# Patient Record
Sex: Female | Born: 1945 | Race: White | Hispanic: No | Smoking: Never smoker
Health system: Southern US, Community
[De-identification: ages and names within clinical notes are randomized; demographics above are authoritative.]

## PROBLEM LIST (undated history)

## (undated) DIAGNOSIS — H269 Unspecified cataract: Secondary | ICD-10-CM

## (undated) DIAGNOSIS — K8020 Calculus of gallbladder without cholecystitis without obstruction: Secondary | ICD-10-CM

## (undated) DIAGNOSIS — M810 Age-related osteoporosis without current pathological fracture: Secondary | ICD-10-CM

## (undated) DIAGNOSIS — D649 Anemia, unspecified: Secondary | ICD-10-CM

## (undated) DIAGNOSIS — I499 Cardiac arrhythmia, unspecified: Secondary | ICD-10-CM

## (undated) DIAGNOSIS — K219 Gastro-esophageal reflux disease without esophagitis: Secondary | ICD-10-CM

## (undated) DIAGNOSIS — I4891 Unspecified atrial fibrillation: Secondary | ICD-10-CM

## (undated) DIAGNOSIS — J45909 Unspecified asthma, uncomplicated: Secondary | ICD-10-CM

## (undated) DIAGNOSIS — I10 Essential (primary) hypertension: Secondary | ICD-10-CM

## (undated) DIAGNOSIS — C801 Malignant (primary) neoplasm, unspecified: Secondary | ICD-10-CM

## (undated) DIAGNOSIS — M3500 Sicca syndrome, unspecified: Secondary | ICD-10-CM

## (undated) HISTORY — DX: Unspecified atrial fibrillation: I48.91

## (undated) HISTORY — DX: Age-related osteoporosis without current pathological fracture: M81.0

## (undated) HISTORY — DX: Unspecified cataract: H26.9

## (undated) HISTORY — DX: Anemia, unspecified: D64.9

## (undated) HISTORY — DX: Essential (primary) hypertension: I10

## (undated) HISTORY — DX: Cardiac arrhythmia, unspecified: I49.9

## (undated) HISTORY — DX: Gastro-esophageal reflux disease without esophagitis: K21.9

## (undated) HISTORY — DX: Malignant (primary) neoplasm, unspecified: C80.1

## (undated) HISTORY — DX: Calculus of gallbladder without cholecystitis without obstruction: K80.20

---

## 1976-10-16 HISTORY — PX: AUGMENTATION MAMMAPLASTY: SUR837

## 1994-10-16 HISTORY — PX: ABDOMINAL HYSTERECTOMY: SHX81

## 2003-10-17 HISTORY — PX: ANKLE FRACTURE SURGERY: SHX122

## 2015-10-17 DIAGNOSIS — M5136 Other intervertebral disc degeneration, lumbar region: Secondary | ICD-10-CM | POA: Diagnosis not present

## 2015-10-17 DIAGNOSIS — M545 Low back pain: Secondary | ICD-10-CM | POA: Diagnosis not present

## 2015-10-17 DIAGNOSIS — M3500 Sicca syndrome, unspecified: Secondary | ICD-10-CM | POA: Diagnosis not present

## 2015-10-20 DIAGNOSIS — M545 Low back pain: Secondary | ICD-10-CM | POA: Diagnosis not present

## 2015-10-20 DIAGNOSIS — M5136 Other intervertebral disc degeneration, lumbar region: Secondary | ICD-10-CM | POA: Diagnosis not present

## 2015-10-20 DIAGNOSIS — M3500 Sicca syndrome, unspecified: Secondary | ICD-10-CM | POA: Diagnosis not present

## 2015-10-28 DIAGNOSIS — Z23 Encounter for immunization: Secondary | ICD-10-CM | POA: Diagnosis not present

## 2015-10-28 DIAGNOSIS — E538 Deficiency of other specified B group vitamins: Secondary | ICD-10-CM | POA: Diagnosis not present

## 2015-10-28 DIAGNOSIS — Z5181 Encounter for therapeutic drug level monitoring: Secondary | ICD-10-CM | POA: Diagnosis not present

## 2015-10-28 DIAGNOSIS — E785 Hyperlipidemia, unspecified: Secondary | ICD-10-CM | POA: Diagnosis not present

## 2015-11-03 DIAGNOSIS — M5136 Other intervertebral disc degeneration, lumbar region: Secondary | ICD-10-CM | POA: Diagnosis not present

## 2015-11-03 DIAGNOSIS — M3500 Sicca syndrome, unspecified: Secondary | ICD-10-CM | POA: Diagnosis not present

## 2015-11-03 DIAGNOSIS — M545 Low back pain: Secondary | ICD-10-CM | POA: Diagnosis not present

## 2015-11-05 DIAGNOSIS — Z79899 Other long term (current) drug therapy: Secondary | ICD-10-CM | POA: Diagnosis not present

## 2015-11-05 DIAGNOSIS — E538 Deficiency of other specified B group vitamins: Secondary | ICD-10-CM | POA: Diagnosis not present

## 2015-12-22 DIAGNOSIS — M8580 Other specified disorders of bone density and structure, unspecified site: Secondary | ICD-10-CM | POA: Diagnosis not present

## 2015-12-22 DIAGNOSIS — M3500 Sicca syndrome, unspecified: Secondary | ICD-10-CM | POA: Diagnosis not present

## 2016-04-25 DIAGNOSIS — Z0000 Encounter for general adult medical examination without abnormal findings: Secondary | ICD-10-CM | POA: Diagnosis not present

## 2016-04-25 DIAGNOSIS — Z23 Encounter for immunization: Secondary | ICD-10-CM | POA: Diagnosis not present

## 2016-05-02 DIAGNOSIS — L821 Other seborrheic keratosis: Secondary | ICD-10-CM | POA: Diagnosis not present

## 2016-05-04 DIAGNOSIS — Z1231 Encounter for screening mammogram for malignant neoplasm of breast: Secondary | ICD-10-CM | POA: Diagnosis not present

## 2016-05-09 DIAGNOSIS — M25561 Pain in right knee: Secondary | ICD-10-CM | POA: Diagnosis not present

## 2016-05-11 DIAGNOSIS — M1711 Unilateral primary osteoarthritis, right knee: Secondary | ICD-10-CM | POA: Diagnosis not present

## 2016-05-11 DIAGNOSIS — M25561 Pain in right knee: Secondary | ICD-10-CM | POA: Diagnosis not present

## 2016-06-28 DIAGNOSIS — M5136 Other intervertebral disc degeneration, lumbar region: Secondary | ICD-10-CM | POA: Diagnosis not present

## 2016-06-28 DIAGNOSIS — M1711 Unilateral primary osteoarthritis, right knee: Secondary | ICD-10-CM | POA: Diagnosis not present

## 2016-06-28 DIAGNOSIS — M3500 Sicca syndrome, unspecified: Secondary | ICD-10-CM | POA: Diagnosis not present

## 2016-07-25 DIAGNOSIS — M3501 Sicca syndrome with keratoconjunctivitis: Secondary | ICD-10-CM | POA: Diagnosis not present

## 2016-07-25 DIAGNOSIS — Z79899 Other long term (current) drug therapy: Secondary | ICD-10-CM | POA: Diagnosis not present

## 2016-09-12 DIAGNOSIS — Z79899 Other long term (current) drug therapy: Secondary | ICD-10-CM | POA: Diagnosis not present

## 2016-10-23 DIAGNOSIS — M3500 Sicca syndrome, unspecified: Secondary | ICD-10-CM | POA: Diagnosis not present

## 2016-10-23 DIAGNOSIS — M19041 Primary osteoarthritis, right hand: Secondary | ICD-10-CM | POA: Diagnosis not present

## 2016-10-23 DIAGNOSIS — M1990 Unspecified osteoarthritis, unspecified site: Secondary | ICD-10-CM | POA: Diagnosis not present

## 2016-10-23 DIAGNOSIS — M25741 Osteophyte, right hand: Secondary | ICD-10-CM | POA: Diagnosis not present

## 2016-10-23 DIAGNOSIS — M151 Heberden's nodes (with arthropathy): Secondary | ICD-10-CM | POA: Diagnosis not present

## 2016-10-31 DIAGNOSIS — E785 Hyperlipidemia, unspecified: Secondary | ICD-10-CM | POA: Diagnosis not present

## 2016-10-31 DIAGNOSIS — K219 Gastro-esophageal reflux disease without esophagitis: Secondary | ICD-10-CM | POA: Diagnosis not present

## 2016-11-28 DIAGNOSIS — K219 Gastro-esophageal reflux disease without esophagitis: Secondary | ICD-10-CM | POA: Diagnosis not present

## 2016-11-28 DIAGNOSIS — R1314 Dysphagia, pharyngoesophageal phase: Secondary | ICD-10-CM | POA: Diagnosis not present

## 2016-11-28 DIAGNOSIS — Z1211 Encounter for screening for malignant neoplasm of colon: Secondary | ICD-10-CM | POA: Diagnosis not present

## 2016-12-11 DIAGNOSIS — Z538 Procedure and treatment not carried out for other reasons: Secondary | ICD-10-CM | POA: Diagnosis not present

## 2016-12-11 DIAGNOSIS — K317 Polyp of stomach and duodenum: Secondary | ICD-10-CM | POA: Diagnosis not present

## 2016-12-11 DIAGNOSIS — R1319 Other dysphagia: Secondary | ICD-10-CM | POA: Diagnosis not present

## 2016-12-11 DIAGNOSIS — J45909 Unspecified asthma, uncomplicated: Secondary | ICD-10-CM | POA: Diagnosis not present

## 2016-12-11 DIAGNOSIS — K2950 Unspecified chronic gastritis without bleeding: Secondary | ICD-10-CM | POA: Diagnosis not present

## 2016-12-11 DIAGNOSIS — K210 Gastro-esophageal reflux disease with esophagitis: Secondary | ICD-10-CM | POA: Diagnosis not present

## 2016-12-11 DIAGNOSIS — K2960 Other gastritis without bleeding: Secondary | ICD-10-CM | POA: Diagnosis not present

## 2016-12-11 DIAGNOSIS — K228 Other specified diseases of esophagus: Secondary | ICD-10-CM | POA: Diagnosis not present

## 2016-12-11 DIAGNOSIS — K5730 Diverticulosis of large intestine without perforation or abscess without bleeding: Secondary | ICD-10-CM | POA: Diagnosis not present

## 2016-12-11 DIAGNOSIS — K635 Polyp of colon: Secondary | ICD-10-CM | POA: Diagnosis not present

## 2016-12-11 DIAGNOSIS — K449 Diaphragmatic hernia without obstruction or gangrene: Secondary | ICD-10-CM | POA: Diagnosis not present

## 2016-12-11 DIAGNOSIS — B9681 Helicobacter pylori [H. pylori] as the cause of diseases classified elsewhere: Secondary | ICD-10-CM | POA: Diagnosis not present

## 2016-12-11 DIAGNOSIS — Z79899 Other long term (current) drug therapy: Secondary | ICD-10-CM | POA: Diagnosis not present

## 2016-12-11 DIAGNOSIS — Z7982 Long term (current) use of aspirin: Secondary | ICD-10-CM | POA: Diagnosis not present

## 2016-12-11 DIAGNOSIS — Z1211 Encounter for screening for malignant neoplasm of colon: Secondary | ICD-10-CM | POA: Diagnosis not present

## 2016-12-27 DIAGNOSIS — M1990 Unspecified osteoarthritis, unspecified site: Secondary | ICD-10-CM | POA: Diagnosis not present

## 2016-12-27 DIAGNOSIS — K2970 Gastritis, unspecified, without bleeding: Secondary | ICD-10-CM | POA: Diagnosis not present

## 2016-12-27 DIAGNOSIS — M79646 Pain in unspecified finger(s): Secondary | ICD-10-CM | POA: Diagnosis not present

## 2016-12-27 DIAGNOSIS — B9681 Helicobacter pylori [H. pylori] as the cause of diseases classified elsewhere: Secondary | ICD-10-CM | POA: Diagnosis not present

## 2016-12-27 DIAGNOSIS — K5730 Diverticulosis of large intestine without perforation or abscess without bleeding: Secondary | ICD-10-CM | POA: Diagnosis not present

## 2016-12-27 DIAGNOSIS — K635 Polyp of colon: Secondary | ICD-10-CM | POA: Diagnosis not present

## 2017-01-02 DIAGNOSIS — M65311 Trigger thumb, right thumb: Secondary | ICD-10-CM | POA: Diagnosis not present

## 2017-04-03 DIAGNOSIS — H6980 Other specified disorders of Eustachian tube, unspecified ear: Secondary | ICD-10-CM | POA: Diagnosis not present

## 2017-07-05 DIAGNOSIS — Z23 Encounter for immunization: Secondary | ICD-10-CM | POA: Diagnosis not present

## 2017-07-05 DIAGNOSIS — Z803 Family history of malignant neoplasm of breast: Secondary | ICD-10-CM | POA: Diagnosis not present

## 2017-07-05 DIAGNOSIS — H25093 Other age-related incipient cataract, bilateral: Secondary | ICD-10-CM | POA: Diagnosis not present

## 2017-07-05 DIAGNOSIS — M3500 Sicca syndrome, unspecified: Secondary | ICD-10-CM | POA: Diagnosis not present

## 2017-07-05 DIAGNOSIS — J4520 Mild intermittent asthma, uncomplicated: Secondary | ICD-10-CM | POA: Diagnosis not present

## 2017-07-05 DIAGNOSIS — Z8042 Family history of malignant neoplasm of prostate: Secondary | ICD-10-CM | POA: Diagnosis not present

## 2017-07-05 DIAGNOSIS — M79644 Pain in right finger(s): Secondary | ICD-10-CM | POA: Diagnosis not present

## 2017-07-05 DIAGNOSIS — M1991 Primary osteoarthritis, unspecified site: Secondary | ICD-10-CM | POA: Diagnosis not present

## 2017-07-05 DIAGNOSIS — K219 Gastro-esophageal reflux disease without esophagitis: Secondary | ICD-10-CM | POA: Diagnosis not present

## 2017-07-11 DIAGNOSIS — M65311 Trigger thumb, right thumb: Secondary | ICD-10-CM | POA: Diagnosis not present

## 2017-07-16 DIAGNOSIS — M65311 Trigger thumb, right thumb: Secondary | ICD-10-CM | POA: Diagnosis not present

## 2017-07-31 DIAGNOSIS — H2512 Age-related nuclear cataract, left eye: Secondary | ICD-10-CM | POA: Diagnosis not present

## 2017-07-31 DIAGNOSIS — H2511 Age-related nuclear cataract, right eye: Secondary | ICD-10-CM | POA: Diagnosis not present

## 2017-07-31 DIAGNOSIS — M3501 Sicca syndrome with keratoconjunctivitis: Secondary | ICD-10-CM | POA: Diagnosis not present

## 2017-09-03 DIAGNOSIS — H2511 Age-related nuclear cataract, right eye: Secondary | ICD-10-CM | POA: Diagnosis not present

## 2017-09-03 DIAGNOSIS — H25811 Combined forms of age-related cataract, right eye: Secondary | ICD-10-CM | POA: Diagnosis not present

## 2017-09-14 DIAGNOSIS — M150 Primary generalized (osteo)arthritis: Secondary | ICD-10-CM | POA: Diagnosis not present

## 2017-09-14 DIAGNOSIS — M25561 Pain in right knee: Secondary | ICD-10-CM | POA: Diagnosis not present

## 2017-09-14 DIAGNOSIS — M3500 Sicca syndrome, unspecified: Secondary | ICD-10-CM | POA: Diagnosis not present

## 2017-09-14 DIAGNOSIS — M5136 Other intervertebral disc degeneration, lumbar region: Secondary | ICD-10-CM | POA: Diagnosis not present

## 2017-09-14 DIAGNOSIS — Z6831 Body mass index (BMI) 31.0-31.9, adult: Secondary | ICD-10-CM | POA: Diagnosis not present

## 2017-09-14 DIAGNOSIS — M2550 Pain in unspecified joint: Secondary | ICD-10-CM | POA: Diagnosis not present

## 2017-09-14 DIAGNOSIS — E669 Obesity, unspecified: Secondary | ICD-10-CM | POA: Diagnosis not present

## 2017-09-18 DIAGNOSIS — H25812 Combined forms of age-related cataract, left eye: Secondary | ICD-10-CM | POA: Diagnosis not present

## 2017-09-18 DIAGNOSIS — H2512 Age-related nuclear cataract, left eye: Secondary | ICD-10-CM | POA: Diagnosis not present

## 2017-09-26 DIAGNOSIS — J4520 Mild intermittent asthma, uncomplicated: Secondary | ICD-10-CM | POA: Diagnosis not present

## 2017-09-26 DIAGNOSIS — M3500 Sicca syndrome, unspecified: Secondary | ICD-10-CM | POA: Diagnosis not present

## 2017-09-26 DIAGNOSIS — Z1389 Encounter for screening for other disorder: Secondary | ICD-10-CM | POA: Diagnosis not present

## 2017-09-26 DIAGNOSIS — H9193 Unspecified hearing loss, bilateral: Secondary | ICD-10-CM | POA: Diagnosis not present

## 2017-09-26 DIAGNOSIS — M1991 Primary osteoarthritis, unspecified site: Secondary | ICD-10-CM | POA: Diagnosis not present

## 2017-09-26 DIAGNOSIS — K219 Gastro-esophageal reflux disease without esophagitis: Secondary | ICD-10-CM | POA: Diagnosis not present

## 2017-09-26 DIAGNOSIS — Z1211 Encounter for screening for malignant neoplasm of colon: Secondary | ICD-10-CM | POA: Diagnosis not present

## 2017-09-26 DIAGNOSIS — M65311 Trigger thumb, right thumb: Secondary | ICD-10-CM | POA: Diagnosis not present

## 2017-09-26 DIAGNOSIS — Z1231 Encounter for screening mammogram for malignant neoplasm of breast: Secondary | ICD-10-CM | POA: Diagnosis not present

## 2017-09-26 DIAGNOSIS — Z0000 Encounter for general adult medical examination without abnormal findings: Secondary | ICD-10-CM | POA: Diagnosis not present

## 2017-10-18 DIAGNOSIS — H26491 Other secondary cataract, right eye: Secondary | ICD-10-CM | POA: Diagnosis not present

## 2017-10-22 DIAGNOSIS — H90A31 Mixed conductive and sensorineural hearing loss, unilateral, right ear with restricted hearing on the contralateral side: Secondary | ICD-10-CM | POA: Diagnosis not present

## 2017-10-22 DIAGNOSIS — H903 Sensorineural hearing loss, bilateral: Secondary | ICD-10-CM | POA: Diagnosis not present

## 2017-10-22 DIAGNOSIS — H90A12 Conductive hearing loss, unilateral, left ear with restricted hearing on the contralateral side: Secondary | ICD-10-CM | POA: Diagnosis not present

## 2017-10-22 DIAGNOSIS — H6993 Unspecified Eustachian tube disorder, bilateral: Secondary | ICD-10-CM | POA: Diagnosis not present

## 2017-12-12 DIAGNOSIS — M150 Primary generalized (osteo)arthritis: Secondary | ICD-10-CM | POA: Diagnosis not present

## 2017-12-12 DIAGNOSIS — M3500 Sicca syndrome, unspecified: Secondary | ICD-10-CM | POA: Diagnosis not present

## 2017-12-12 DIAGNOSIS — Z6833 Body mass index (BMI) 33.0-33.9, adult: Secondary | ICD-10-CM | POA: Diagnosis not present

## 2017-12-12 DIAGNOSIS — M5136 Other intervertebral disc degeneration, lumbar region: Secondary | ICD-10-CM | POA: Diagnosis not present

## 2017-12-12 DIAGNOSIS — E669 Obesity, unspecified: Secondary | ICD-10-CM | POA: Diagnosis not present

## 2018-04-08 ENCOUNTER — Other Ambulatory Visit: Payer: Self-pay | Admitting: Internal Medicine

## 2018-04-08 DIAGNOSIS — M3500 Sicca syndrome, unspecified: Secondary | ICD-10-CM | POA: Diagnosis not present

## 2018-04-08 DIAGNOSIS — Z1231 Encounter for screening mammogram for malignant neoplasm of breast: Secondary | ICD-10-CM

## 2018-04-08 DIAGNOSIS — Z1239 Encounter for other screening for malignant neoplasm of breast: Secondary | ICD-10-CM | POA: Diagnosis not present

## 2018-04-08 DIAGNOSIS — M8588 Other specified disorders of bone density and structure, other site: Secondary | ICD-10-CM | POA: Diagnosis not present

## 2018-04-08 DIAGNOSIS — K219 Gastro-esophageal reflux disease without esophagitis: Secondary | ICD-10-CM | POA: Diagnosis not present

## 2018-04-11 DIAGNOSIS — M5136 Other intervertebral disc degeneration, lumbar region: Secondary | ICD-10-CM | POA: Diagnosis not present

## 2018-04-11 DIAGNOSIS — M3500 Sicca syndrome, unspecified: Secondary | ICD-10-CM | POA: Diagnosis not present

## 2018-04-11 DIAGNOSIS — Z6833 Body mass index (BMI) 33.0-33.9, adult: Secondary | ICD-10-CM | POA: Diagnosis not present

## 2018-04-11 DIAGNOSIS — M150 Primary generalized (osteo)arthritis: Secondary | ICD-10-CM | POA: Diagnosis not present

## 2018-04-11 DIAGNOSIS — E669 Obesity, unspecified: Secondary | ICD-10-CM | POA: Diagnosis not present

## 2018-04-23 DIAGNOSIS — H029 Unspecified disorder of eyelid: Secondary | ICD-10-CM | POA: Diagnosis not present

## 2018-04-29 ENCOUNTER — Ambulatory Visit
Admission: RE | Admit: 2018-04-29 | Discharge: 2018-04-29 | Disposition: A | Discharge status: Home / Self Care | Payer: Medicare Other | Source: Ambulatory Visit | Attending: Internal Medicine | Admitting: Internal Medicine

## 2018-04-29 DIAGNOSIS — Z1231 Encounter for screening mammogram for malignant neoplasm of breast: Secondary | ICD-10-CM | POA: Diagnosis not present

## 2018-04-29 IMAGING — MG DIGITAL SCREENING BILATERAL MAMMOGRAM WITH IMPLANTS AND CAD
2 series · 2 of 2 positions shown · non-contrast
Comparison: Previous exam(s).

CLINICAL DATA: Screening.

EXAM:
DIGITAL SCREENING BILATERAL MAMMOGRAM WITH IMPLANTS AND CAD
The patient has prepectoral implants. Standard and implant displaced
views were performed.

[R MLO]
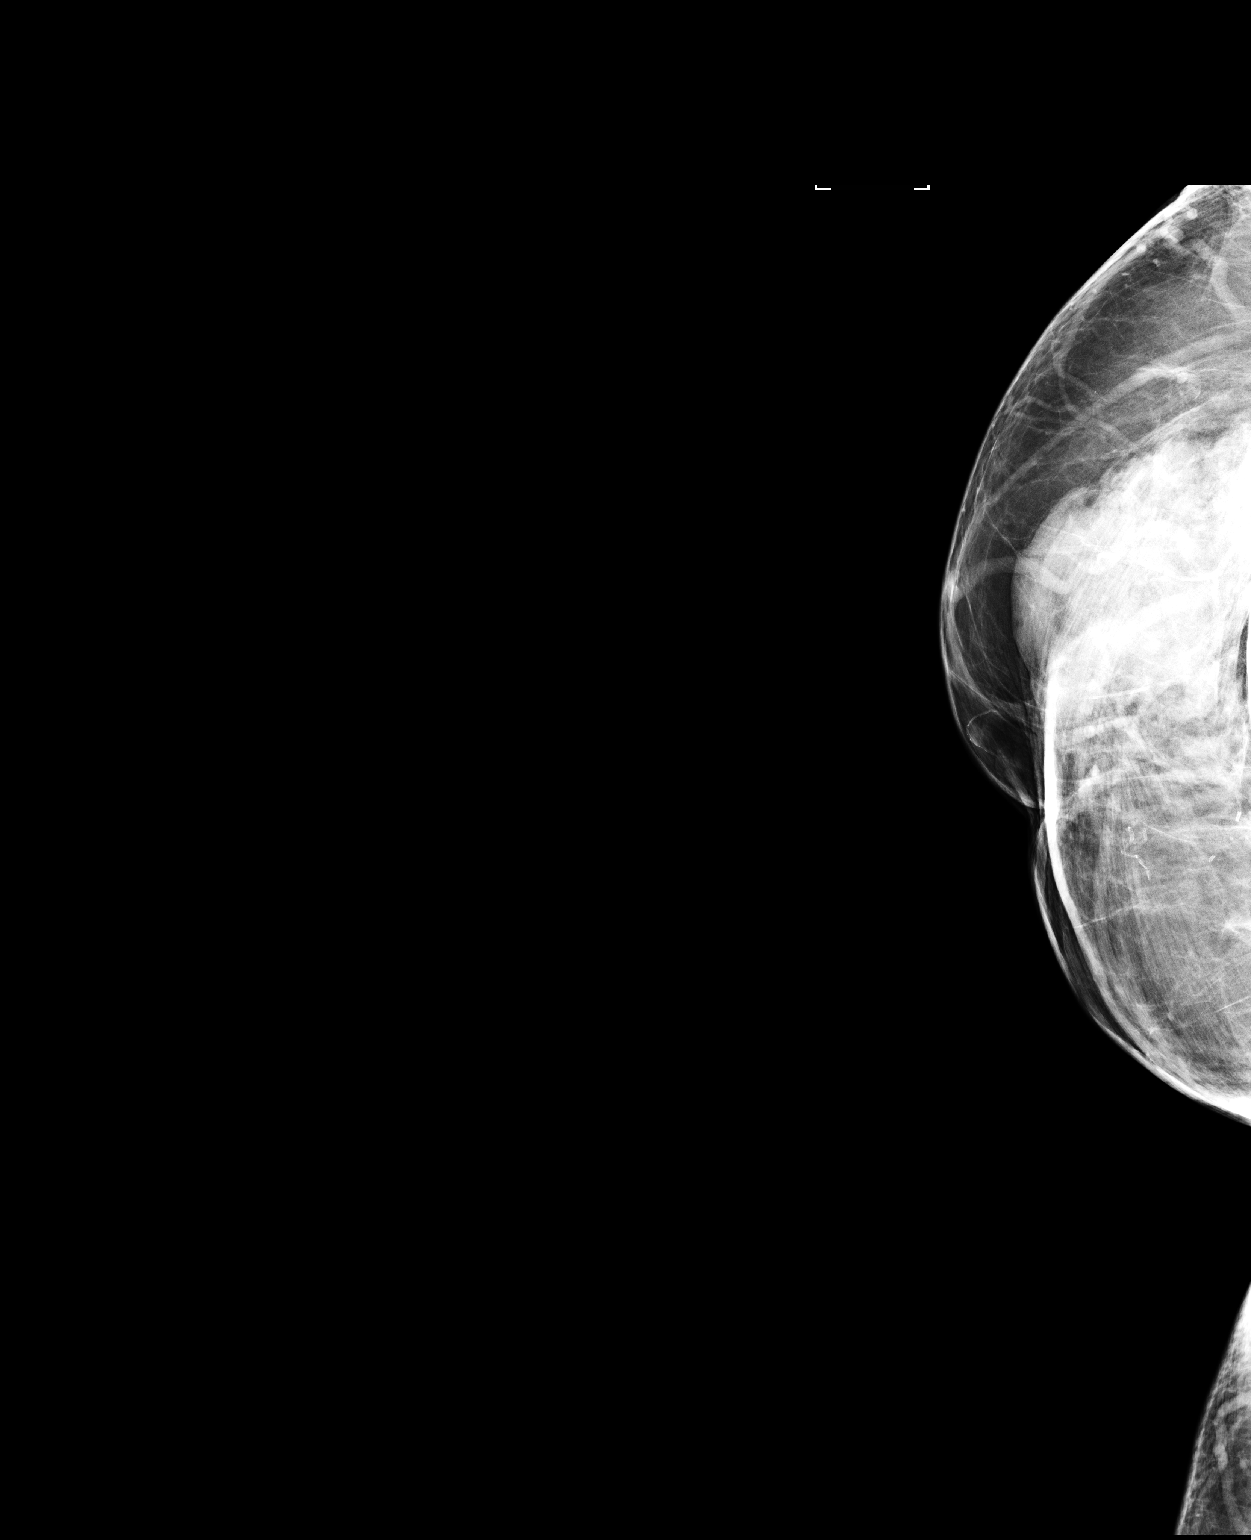

[L MLO]
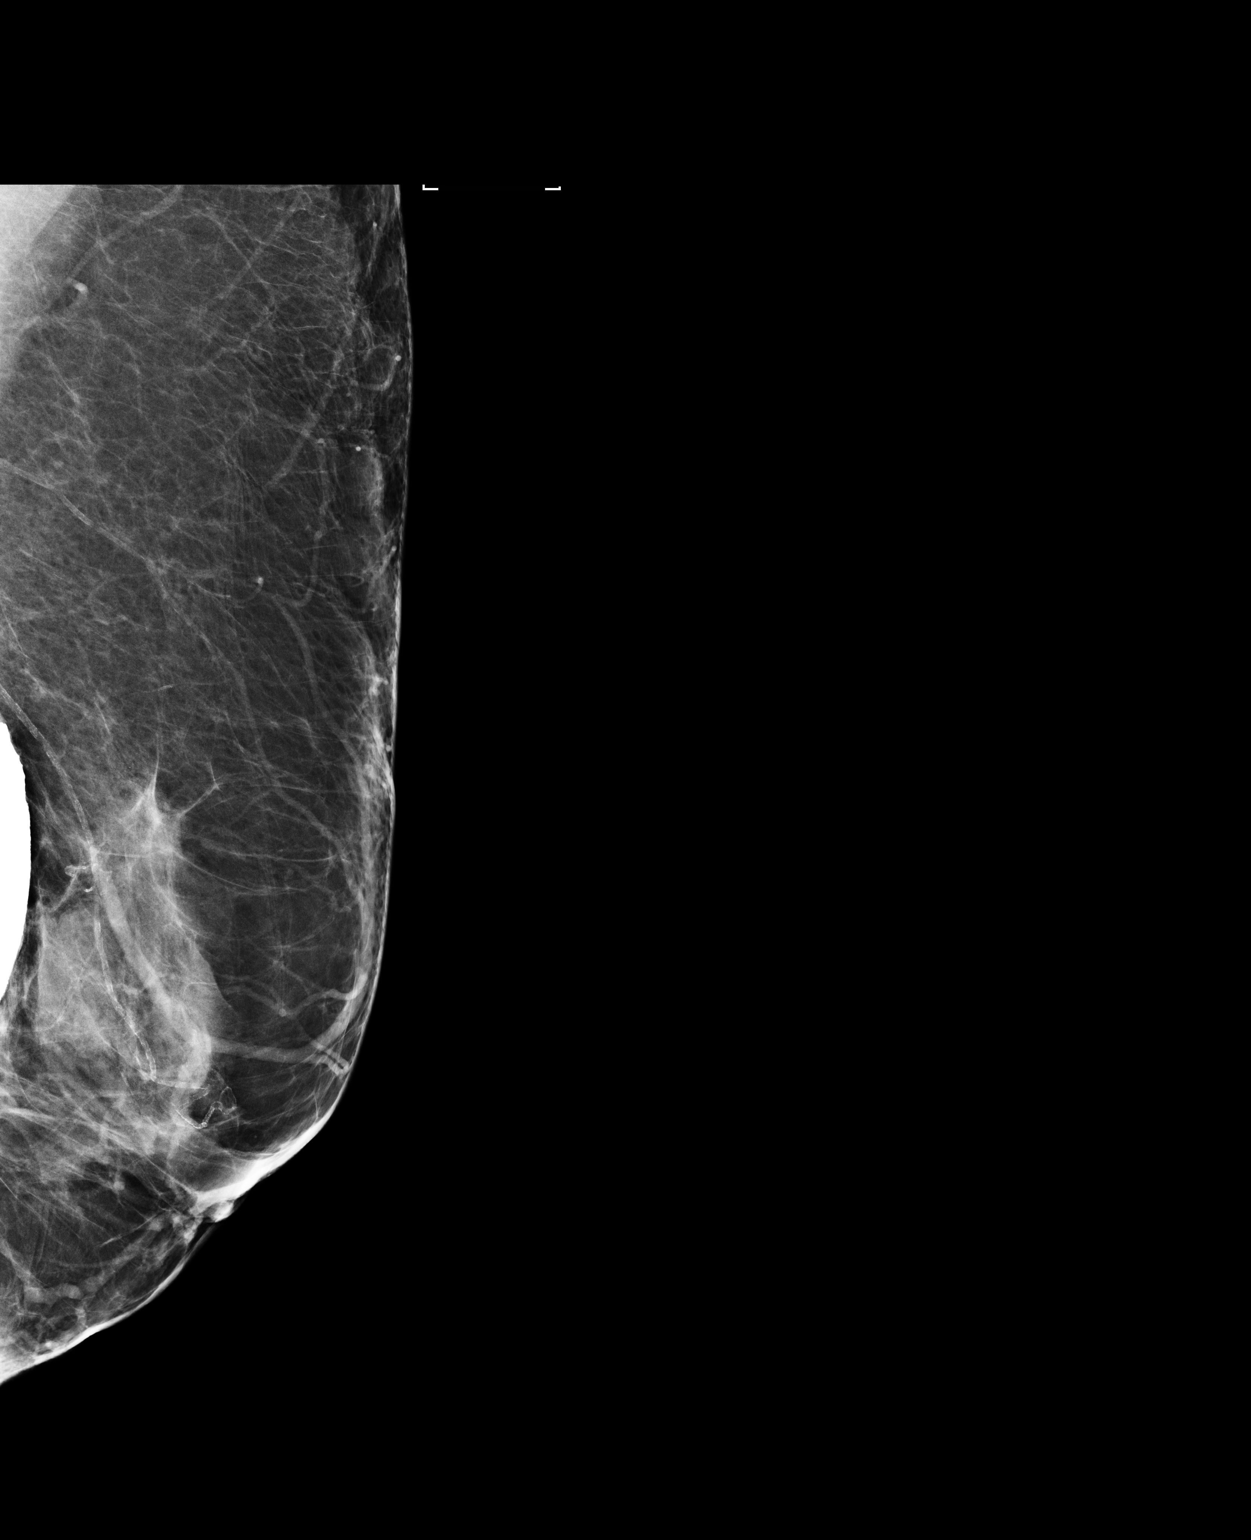

[2 of 2 positions shown; findings below may reference images not displayed]

ACR Breast Density Category c: The breast tissue is heterogeneously
dense, which may obscure small masses.
FINDINGS: There are no findings suspicious for malignancy. There is
extracapsular silicone behind the right breast implant. Images were
processed with CAD.
IMPRESSION: No mammographic evidence of malignancy. A result letter of this
screening mammogram will be mailed directly to the patient.

RECOMMENDATION:
Screening mammogram in one year. (Code:[SM])

BI-RADS CATEGORY  1:  Negative.

## 2018-05-10 DIAGNOSIS — S91209A Unspecified open wound of unspecified toe(s) with damage to nail, initial encounter: Secondary | ICD-10-CM | POA: Diagnosis not present

## 2018-05-10 DIAGNOSIS — L03031 Cellulitis of right toe: Secondary | ICD-10-CM | POA: Diagnosis not present

## 2018-05-16 DIAGNOSIS — M8588 Other specified disorders of bone density and structure, other site: Secondary | ICD-10-CM | POA: Diagnosis not present

## 2018-06-18 DIAGNOSIS — D2210 Melanocytic nevi of unspecified eyelid, including canthus: Secondary | ICD-10-CM | POA: Diagnosis not present

## 2018-06-18 DIAGNOSIS — D492 Neoplasm of unspecified behavior of bone, soft tissue, and skin: Secondary | ICD-10-CM | POA: Diagnosis not present

## 2018-09-11 DIAGNOSIS — N390 Urinary tract infection, site not specified: Secondary | ICD-10-CM | POA: Diagnosis not present

## 2018-09-11 DIAGNOSIS — R350 Frequency of micturition: Secondary | ICD-10-CM | POA: Diagnosis not present

## 2018-09-11 DIAGNOSIS — A499 Bacterial infection, unspecified: Secondary | ICD-10-CM | POA: Diagnosis not present

## 2018-09-24 DIAGNOSIS — M150 Primary generalized (osteo)arthritis: Secondary | ICD-10-CM | POA: Diagnosis not present

## 2018-09-24 DIAGNOSIS — M3500 Sicca syndrome, unspecified: Secondary | ICD-10-CM | POA: Diagnosis not present

## 2018-09-24 DIAGNOSIS — M5136 Other intervertebral disc degeneration, lumbar region: Secondary | ICD-10-CM | POA: Diagnosis not present

## 2018-09-24 DIAGNOSIS — Z6833 Body mass index (BMI) 33.0-33.9, adult: Secondary | ICD-10-CM | POA: Diagnosis not present

## 2018-09-24 DIAGNOSIS — E669 Obesity, unspecified: Secondary | ICD-10-CM | POA: Diagnosis not present

## 2018-09-24 DIAGNOSIS — M25561 Pain in right knee: Secondary | ICD-10-CM | POA: Diagnosis not present

## 2018-09-28 ENCOUNTER — Emergency Department (HOSPITAL_COMMUNITY): Payer: Medicare Other

## 2018-09-28 ENCOUNTER — Encounter: Payer: Self-pay | Admitting: Emergency Medicine

## 2018-09-28 ENCOUNTER — Emergency Department (HOSPITAL_COMMUNITY)
Admission: EM | Admit: 2018-09-28 | Discharge: 2018-09-28 | Disposition: A | Discharge status: Home / Self Care | Payer: Medicare Other | Attending: Emergency Medicine | Admitting: Emergency Medicine

## 2018-09-28 DIAGNOSIS — W010XXA Fall on same level from slipping, tripping and stumbling without subsequent striking against object, initial encounter: Secondary | ICD-10-CM | POA: Diagnosis not present

## 2018-09-28 DIAGNOSIS — S52691A Other fracture of lower end of right ulna, initial encounter for closed fracture: Secondary | ICD-10-CM | POA: Diagnosis not present

## 2018-09-28 DIAGNOSIS — S52601A Unspecified fracture of lower end of right ulna, initial encounter for closed fracture: Secondary | ICD-10-CM | POA: Insufficient documentation

## 2018-09-28 DIAGNOSIS — Y998 Other external cause status: Secondary | ICD-10-CM | POA: Diagnosis not present

## 2018-09-28 DIAGNOSIS — Y9389 Activity, other specified: Secondary | ICD-10-CM | POA: Insufficient documentation

## 2018-09-28 DIAGNOSIS — Z91040 Latex allergy status: Secondary | ICD-10-CM | POA: Diagnosis not present

## 2018-09-28 DIAGNOSIS — S52501A Unspecified fracture of the lower end of right radius, initial encounter for closed fracture: Secondary | ICD-10-CM | POA: Insufficient documentation

## 2018-09-28 DIAGNOSIS — S52611A Displaced fracture of right ulna styloid process, initial encounter for closed fracture: Secondary | ICD-10-CM | POA: Diagnosis not present

## 2018-09-28 DIAGNOSIS — Y92008 Other place in unspecified non-institutional (private) residence as the place of occurrence of the external cause: Secondary | ICD-10-CM | POA: Insufficient documentation

## 2018-09-28 DIAGNOSIS — S6991XA Unspecified injury of right wrist, hand and finger(s), initial encounter: Secondary | ICD-10-CM | POA: Diagnosis present

## 2018-09-28 IMAGING — DX DG WRIST COMPLETE 3+V*R*
4 series · 4 of 4 positions shown · non-contrast
Comparison: None.

CLINICAL DATA: Pain after trauma

EXAM:
RIGHT WRIST - COMPLETE 3+ VIEW

[wrist ap (1 of 2)]
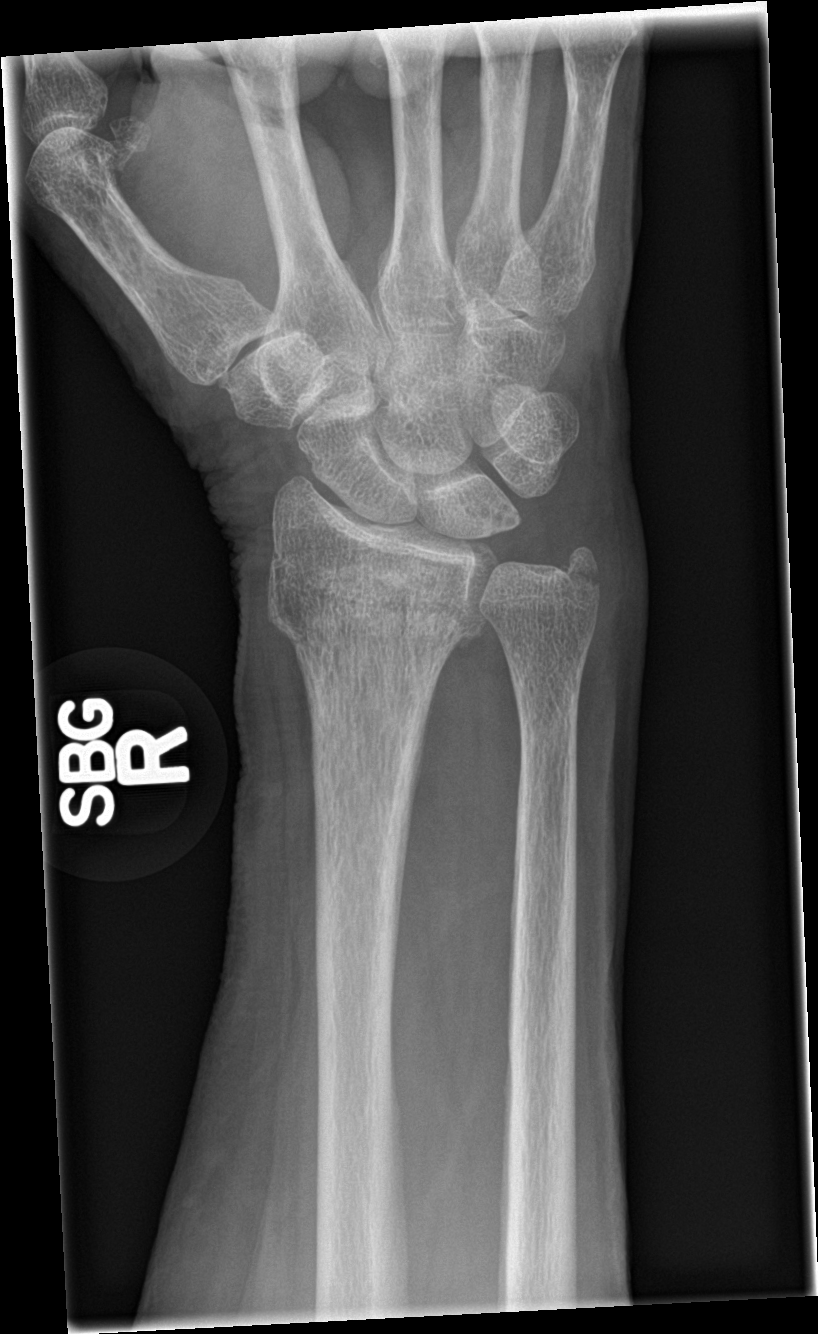

[wrist obl]
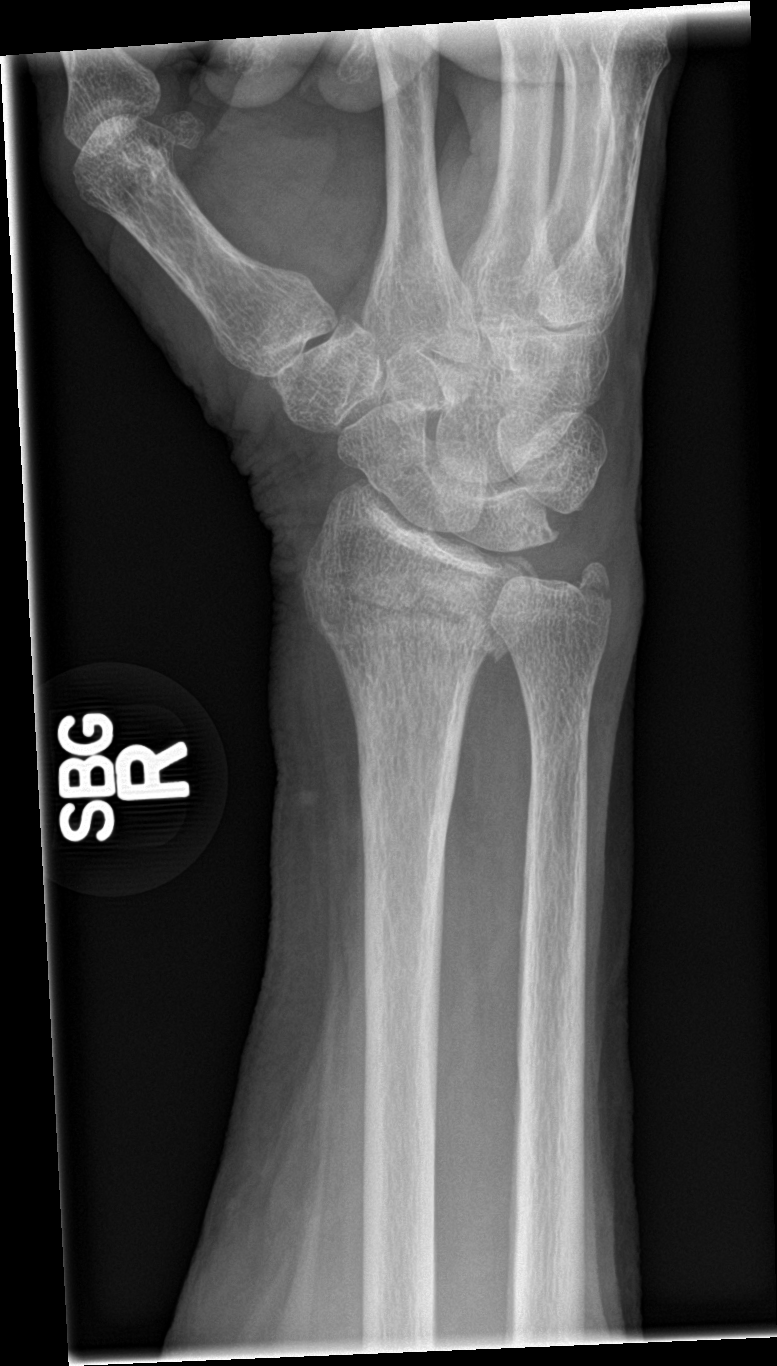

[wrist lat]
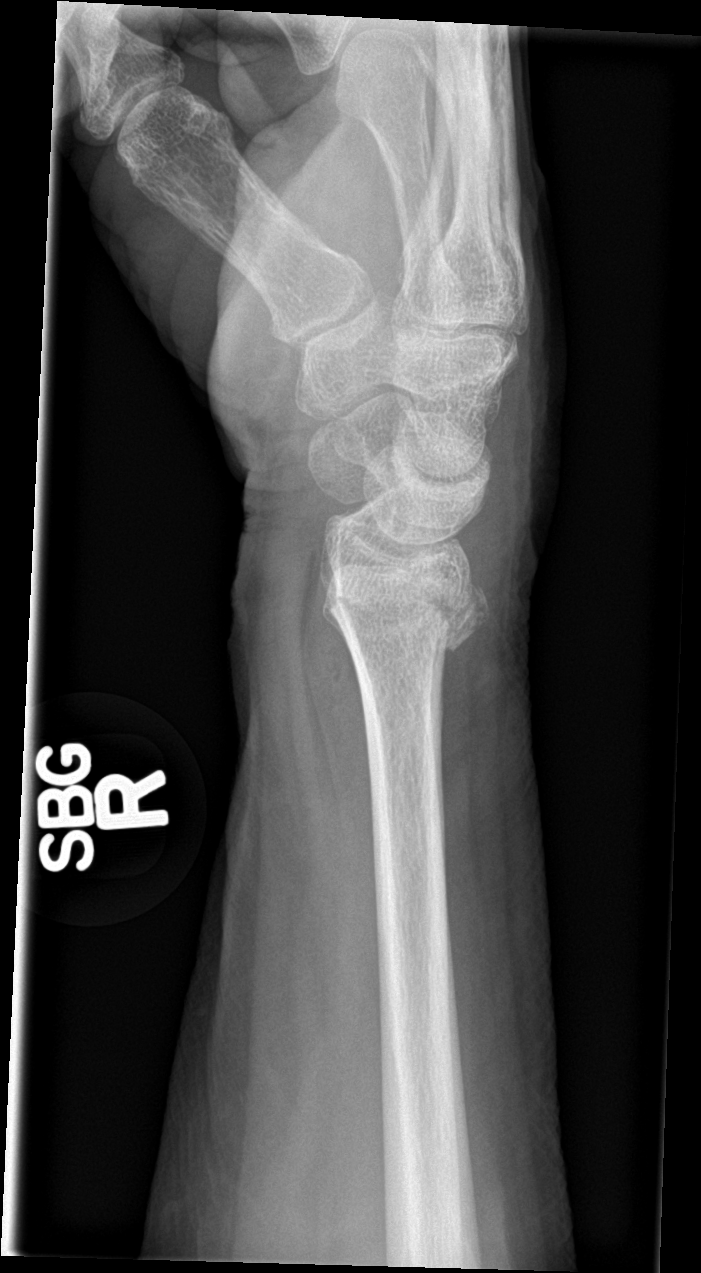

[wrist ap (2 of 2)]
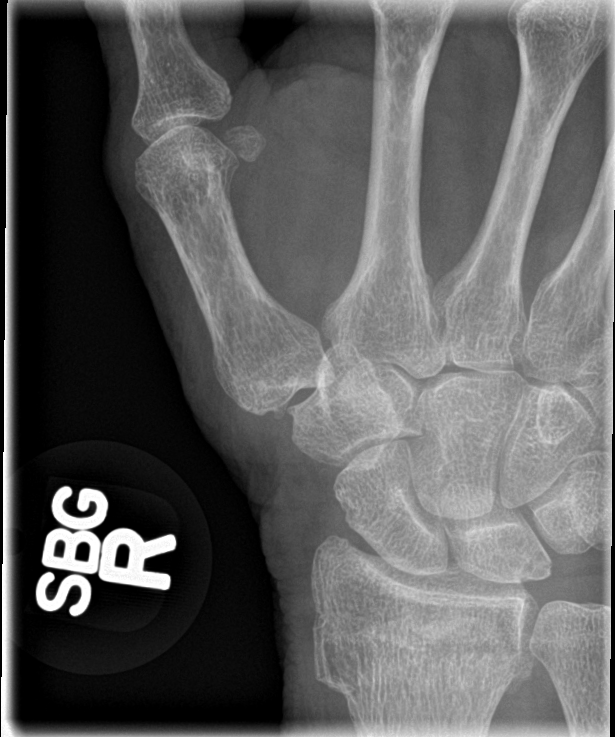

[4 of 4 positions shown; findings below may reference images not displayed]

FINDINGS: There is a fracture through the ulnar styloid. There is an impacted
comminuted mildly displaced fracture through the radial metaphysis.
IMPRESSION: Fracture through the ulnar styloid. Impacted comminuted mildly
displaced fracture through the radial metaphysis.

## 2018-09-28 MED ORDER — HYDROCODONE-ACETAMINOPHEN 5-325 MG PO TABS: 1.0000 | ORAL_TABLET | Freq: Three times a day (TID) | ORAL | 0 refills | Status: DC | PRN

## 2018-09-28 MED ORDER — HYDROCODONE-ACETAMINOPHEN 5-325 MG PO TABS
1.0000 | ORAL_TABLET | Freq: Once | ORAL | Status: AC
  Administered 2018-09-28: 1 via ORAL
  Filled 2018-09-28: qty 1

## 2018-09-28 NOTE — ED Notes (Signed)
Discharge instructions and prescription discussed with Pt. Pt verbalized understanding. Pt stable and ambulatory.    

## 2018-09-28 NOTE — ED Triage Notes (Signed)
Pt reports she was shaking her door mat out and tripped and fell and landed on right wrist. Denies LOC or hitting head.

## 2018-09-28 NOTE — Progress Notes (Signed)
Orthopedic Tech Progress Note Patient Details:  Denise Payne 1946-01-18 [id number]  Ortho Devices Type of Ortho Device: Ace wrap, Sugartong splint, Arm sling Ortho Device/Splint Interventions: Application   Post Interventions Patient Tolerated: Well Instructions Provided: Care of device   [name] 09/28/2018, 6:44 PM

## 2018-09-28 NOTE — ED Provider Notes (Signed)
[facility] EMERGENCY DEPARTMENT Provider Note   CSN: [id number] Arrival date & time: 09/28/18  1722     History   Chief Complaint Chief Complaint  Patient presents with  . Wrist Pain    HPI Denise Payne is a 71 y.o. female who presents for evaluation of right wrist pain that began about 45 minutes prior to ED arrival.  Patient reports she was outside and she was shaking on a rug.  She states when she turned around to come back in, she tripped, causing her to fall forward.  She denies any preceding chest pain or dizziness.  She states that she had her arm outstretched and landed on the dorsal aspect of her right wrist.  Since then has had pain in that wrist.  She had reports limited range of motion secondary to pain.  She has not take any medications for pain prior to coming to the ED.  She states she did not hit her head or lose consciousness.  She is not on any blood thinners.  Patient denies any numbness.  The history is provided by the patient.    History reviewed. No pertinent past medical history.  There are no active problems to display for this patient.   Past Surgical History:  Procedure Laterality Date  . AUGMENTATION MAMMAPLASTY Bilateral 10/16/1976     OB History   No obstetric history on file.      Home Medications    Prior to Admission medications   Not on File    Family History Family History  Problem Relation Age of Onset  . Breast cancer Mother 14    Social History Social History   Tobacco Use  . Smoking status: Never Smoker  . Smokeless tobacco: Never Used  Substance Use Topics  . Alcohol use: Yes    Alcohol/week: 1.0 standard drinks    Types: 1 Glasses of wine per week  . Drug use: Never     Allergies   Latex   Review of Systems Review of Systems  Musculoskeletal:       Right wrist pain  Neurological: Negative for numbness.  All other systems reviewed and are negative.    Physical Exam Updated Vital  Signs BP 139/77 (BP Location: Left Arm)   Pulse 84   Temp (!) 97.5 F (36.4 C) (Oral)   Resp 16   Ht 5\' 2"  (1.575 m)   Wt 81.6 kg   SpO2 99%   BMI 32.92 kg/m   Physical Exam Vitals signs and nursing note reviewed.  Constitutional:      Appearance: She is well-developed.  HENT:     Head: Normocephalic and atraumatic.  Eyes:     General: No scleral icterus.       Right eye: No discharge.        Left eye: No discharge.     Conjunctiva/sclera: Conjunctivae normal.  Cardiovascular:     Pulses:          Radial pulses are 2+ on the right side and 2+ on the left side.  Pulmonary:     Effort: Pulmonary effort is normal.  Musculoskeletal:     Comments: Tenderness palpation noted to the dorsal aspect of right wrist with overlying soft tissue swelling.  Limited flexion/extension secondary to pain.  She can move all 5 digits of right hand without any difficulty.  No tenderness palpation noted to the mid forearm, right elbow, right shoulder.  No tenderness palpation noted to left wrist.  Skin:    General: Skin is warm and dry.     Capillary Refill: Capillary refill takes less than 2 seconds.     Comments: Good distal cap refill.  RUE is not dusky in appearance or cool to touch.\  Neurological:     Mental Status: She is alert.     Comments: Sensation intact along major nerve distributions of BUE  Psychiatric:        Speech: Speech normal.        Behavior: Behavior normal.      ED Treatments / Results  Labs (all labs ordered are listed, but only abnormal results are displayed) Labs Reviewed - No data to display  EKG None  Radiology Dg Wrist Complete Right  Result Date: 09/28/2018 CLINICAL DATA:  Pain after trauma EXAM: RIGHT WRIST - COMPLETE 3+ VIEW COMPARISON:  None. FINDINGS: There is a fracture through the ulnar styloid. There is an impacted comminuted mildly displaced fracture through the radial metaphysis. IMPRESSION: Fracture through the ulnar styloid. Impacted  comminuted mildly displaced fracture through the radial metaphysis. Electronically Signed   By: Dorise Bullion III M.D   On: 09/28/2018 17:54    Procedures Procedures (including critical care time)  Medications Ordered in ED Medications  HYDROcodone-acetaminophen (NORCO/VICODIN) 5-325 MG per tablet 1 tablet (1 tablet Oral Given 09/28/18 1737)     Initial Impression / Assessment and Plan / ED Course  I have reviewed the triage vital signs and the nursing notes.  Pertinent labs & imaging results that were available during my care of the patient were reviewed by me and considered in my medical decision making (see chart for details).     72 year old female who presents for evaluation of right wrist pain that began after mechanical fall that occurred earlier today.  No preceding chest pain or dizziness. Patient is afebrile, non-toxic appearing, sitting comfortably on examination table. Vital signs reviewed and stable.  Patient is neurovascularly intact.  Consider fracture versus dislocation.  We will plan for x-rays.  X-ray shows comminuted distal radius fracture as well as ulnar styloid fracture.  Discussed results with patient.  Will place patient in sugar tong splint.  Patient has previously Dr. Fredna Dow for trigger finger surgery.  Instructed patient to follow-up with Dr. Fredna Dow additionally for follow-up on an outpatient basis.  Reevaluation after splint placement.  Patient with good distal cap refill and sensation.  Encourage at home supportive care measures. Patient had ample opportunity for questions and discussion. All patient's questions were answered with full understanding. Strict return precautions discussed. Patient expresses understanding and agreement to plan.   Final Clinical Impressions(s) / ED Diagnoses   Final diagnoses:  Closed fracture of distal end of right radius, unspecified fracture morphology, initial encounter  Other closed fracture of distal end of right ulna,  initial encounter    ED Discharge Orders    None       Desma Mcgregor 09/28/18 2231    Margette Fast, MD 09/29/18 [phone]

## 2018-09-28 NOTE — ED Notes (Signed)
Ortho tech paged  

## 2018-09-28 NOTE — Discharge Instructions (Signed)
You can take 1000 mg of Tylenol.  Do not exceed 4000 mg of Tylenol a day.  Take pain medications as directed for break through pain. Do not drive or operate machinery while taking this medication.   Call Dr. Levell July office on Monday for further follow-up in his office.  Return the emergency department for any worsening pain, swelling of the arm, numbness or weakness in the fingers or any other worsening concerning symptoms.

## 2018-09-30 DIAGNOSIS — S52551A Other extraarticular fracture of lower end of right radius, initial encounter for closed fracture: Secondary | ICD-10-CM | POA: Diagnosis not present

## 2018-10-14 DIAGNOSIS — S52551A Other extraarticular fracture of lower end of right radius, initial encounter for closed fracture: Secondary | ICD-10-CM | POA: Diagnosis not present

## 2018-11-05 DIAGNOSIS — S52551A Other extraarticular fracture of lower end of right radius, initial encounter for closed fracture: Secondary | ICD-10-CM | POA: Diagnosis not present

## 2018-11-26 DIAGNOSIS — S52551A Other extraarticular fracture of lower end of right radius, initial encounter for closed fracture: Secondary | ICD-10-CM | POA: Diagnosis not present

## 2018-12-19 DIAGNOSIS — S52551A Other extraarticular fracture of lower end of right radius, initial encounter for closed fracture: Secondary | ICD-10-CM | POA: Diagnosis not present

## 2018-12-23 DIAGNOSIS — S52551D Other extraarticular fracture of lower end of right radius, subsequent encounter for closed fracture with routine healing: Secondary | ICD-10-CM | POA: Diagnosis not present

## 2018-12-23 DIAGNOSIS — M25531 Pain in right wrist: Secondary | ICD-10-CM | POA: Diagnosis not present

## 2018-12-23 DIAGNOSIS — R29898 Other symptoms and signs involving the musculoskeletal system: Secondary | ICD-10-CM | POA: Diagnosis not present

## 2018-12-23 DIAGNOSIS — M25631 Stiffness of right wrist, not elsewhere classified: Secondary | ICD-10-CM | POA: Diagnosis not present

## 2018-12-25 DIAGNOSIS — M25531 Pain in right wrist: Secondary | ICD-10-CM | POA: Diagnosis not present

## 2018-12-25 DIAGNOSIS — M25631 Stiffness of right wrist, not elsewhere classified: Secondary | ICD-10-CM | POA: Diagnosis not present

## 2018-12-25 DIAGNOSIS — R29898 Other symptoms and signs involving the musculoskeletal system: Secondary | ICD-10-CM | POA: Diagnosis not present

## 2018-12-25 DIAGNOSIS — S52551D Other extraarticular fracture of lower end of right radius, subsequent encounter for closed fracture with routine healing: Secondary | ICD-10-CM | POA: Diagnosis not present

## 2018-12-30 DIAGNOSIS — M25531 Pain in right wrist: Secondary | ICD-10-CM | POA: Diagnosis not present

## 2018-12-30 DIAGNOSIS — M25631 Stiffness of right wrist, not elsewhere classified: Secondary | ICD-10-CM | POA: Diagnosis not present

## 2018-12-30 DIAGNOSIS — S52551D Other extraarticular fracture of lower end of right radius, subsequent encounter for closed fracture with routine healing: Secondary | ICD-10-CM | POA: Diagnosis not present

## 2018-12-30 DIAGNOSIS — R29898 Other symptoms and signs involving the musculoskeletal system: Secondary | ICD-10-CM | POA: Diagnosis not present

## 2019-01-01 DIAGNOSIS — M25531 Pain in right wrist: Secondary | ICD-10-CM | POA: Diagnosis not present

## 2019-01-01 DIAGNOSIS — S52551D Other extraarticular fracture of lower end of right radius, subsequent encounter for closed fracture with routine healing: Secondary | ICD-10-CM | POA: Diagnosis not present

## 2019-01-01 DIAGNOSIS — R29898 Other symptoms and signs involving the musculoskeletal system: Secondary | ICD-10-CM | POA: Diagnosis not present

## 2019-01-01 DIAGNOSIS — M25631 Stiffness of right wrist, not elsewhere classified: Secondary | ICD-10-CM | POA: Diagnosis not present

## 2019-01-06 DIAGNOSIS — M25631 Stiffness of right wrist, not elsewhere classified: Secondary | ICD-10-CM | POA: Diagnosis not present

## 2019-01-06 DIAGNOSIS — M25531 Pain in right wrist: Secondary | ICD-10-CM | POA: Diagnosis not present

## 2019-01-06 DIAGNOSIS — R29898 Other symptoms and signs involving the musculoskeletal system: Secondary | ICD-10-CM | POA: Diagnosis not present

## 2019-01-06 DIAGNOSIS — S52551D Other extraarticular fracture of lower end of right radius, subsequent encounter for closed fracture with routine healing: Secondary | ICD-10-CM | POA: Diagnosis not present

## 2019-01-14 DIAGNOSIS — M25531 Pain in right wrist: Secondary | ICD-10-CM | POA: Diagnosis not present

## 2019-01-14 DIAGNOSIS — R29898 Other symptoms and signs involving the musculoskeletal system: Secondary | ICD-10-CM | POA: Diagnosis not present

## 2019-01-14 DIAGNOSIS — M25631 Stiffness of right wrist, not elsewhere classified: Secondary | ICD-10-CM | POA: Diagnosis not present

## 2019-01-14 DIAGNOSIS — S52551D Other extraarticular fracture of lower end of right radius, subsequent encounter for closed fracture with routine healing: Secondary | ICD-10-CM | POA: Diagnosis not present

## 2019-01-21 DIAGNOSIS — S52551A Other extraarticular fracture of lower end of right radius, initial encounter for closed fracture: Secondary | ICD-10-CM | POA: Diagnosis not present

## 2019-01-21 DIAGNOSIS — M65311 Trigger thumb, right thumb: Secondary | ICD-10-CM | POA: Diagnosis not present

## 2019-01-22 DIAGNOSIS — S52551A Other extraarticular fracture of lower end of right radius, initial encounter for closed fracture: Secondary | ICD-10-CM | POA: Diagnosis not present

## 2019-01-22 DIAGNOSIS — M25631 Stiffness of right wrist, not elsewhere classified: Secondary | ICD-10-CM | POA: Diagnosis not present

## 2019-01-22 DIAGNOSIS — M25531 Pain in right wrist: Secondary | ICD-10-CM | POA: Diagnosis not present

## 2019-01-22 DIAGNOSIS — R29898 Other symptoms and signs involving the musculoskeletal system: Secondary | ICD-10-CM | POA: Diagnosis not present

## 2019-01-22 DIAGNOSIS — S52551D Other extraarticular fracture of lower end of right radius, subsequent encounter for closed fracture with routine healing: Secondary | ICD-10-CM | POA: Diagnosis not present

## 2019-01-25 ENCOUNTER — Inpatient Hospital Stay (HOSPITAL_BASED_OUTPATIENT_CLINIC_OR_DEPARTMENT_OTHER)
Admission: EM | Admit: 2019-01-25 | Discharge: 2019-02-08 | DRG: 391 | Disposition: A | Discharge status: Home / Self Care | Payer: Medicare Other | Attending: Family Medicine | Admitting: Family Medicine

## 2019-01-25 ENCOUNTER — Emergency Department (HOSPITAL_BASED_OUTPATIENT_CLINIC_OR_DEPARTMENT_OTHER): Payer: Medicare Other

## 2019-01-25 ENCOUNTER — Other Ambulatory Visit: Payer: Self-pay

## 2019-01-25 ENCOUNTER — Encounter (HOSPITAL_BASED_OUTPATIENT_CLINIC_OR_DEPARTMENT_OTHER): Payer: Self-pay | Admitting: Emergency Medicine

## 2019-01-25 DIAGNOSIS — R8271 Bacteriuria: Secondary | ICD-10-CM | POA: Diagnosis not present

## 2019-01-25 DIAGNOSIS — R3989 Other symptoms and signs involving the genitourinary system: Secondary | ICD-10-CM | POA: Diagnosis present

## 2019-01-25 DIAGNOSIS — Z7982 Long term (current) use of aspirin: Secondary | ICD-10-CM

## 2019-01-25 DIAGNOSIS — Z6832 Body mass index (BMI) 32.0-32.9, adult: Secondary | ICD-10-CM

## 2019-01-25 DIAGNOSIS — Z79899 Other long term (current) drug therapy: Secondary | ICD-10-CM

## 2019-01-25 DIAGNOSIS — K631 Perforation of intestine (nontraumatic): Secondary | ICD-10-CM | POA: Diagnosis not present

## 2019-01-25 DIAGNOSIS — K5732 Diverticulitis of large intestine without perforation or abscess without bleeding: Secondary | ICD-10-CM | POA: Diagnosis not present

## 2019-01-25 DIAGNOSIS — M3500 Sicca syndrome, unspecified: Secondary | ICD-10-CM | POA: Diagnosis not present

## 2019-01-25 DIAGNOSIS — K5720 Diverticulitis of large intestine with perforation and abscess without bleeding: Principal | ICD-10-CM | POA: Diagnosis present

## 2019-01-25 DIAGNOSIS — E669 Obesity, unspecified: Secondary | ICD-10-CM | POA: Diagnosis present

## 2019-01-25 DIAGNOSIS — K651 Peritoneal abscess: Secondary | ICD-10-CM | POA: Diagnosis present

## 2019-01-25 DIAGNOSIS — J45998 Other asthma: Secondary | ICD-10-CM | POA: Diagnosis present

## 2019-01-25 DIAGNOSIS — K5792 Diverticulitis of intestine, part unspecified, without perforation or abscess without bleeding: Secondary | ICD-10-CM

## 2019-01-25 DIAGNOSIS — J4520 Mild intermittent asthma, uncomplicated: Secondary | ICD-10-CM | POA: Diagnosis not present

## 2019-01-25 DIAGNOSIS — N321 Vesicointestinal fistula: Secondary | ICD-10-CM | POA: Diagnosis not present

## 2019-01-25 DIAGNOSIS — Z91040 Latex allergy status: Secondary | ICD-10-CM | POA: Diagnosis not present

## 2019-01-25 DIAGNOSIS — E876 Hypokalemia: Secondary | ICD-10-CM | POA: Diagnosis not present

## 2019-01-25 DIAGNOSIS — Z23 Encounter for immunization: Secondary | ICD-10-CM | POA: Diagnosis not present

## 2019-01-25 DIAGNOSIS — R1030 Lower abdominal pain, unspecified: Secondary | ICD-10-CM | POA: Diagnosis not present

## 2019-01-25 DIAGNOSIS — R1084 Generalized abdominal pain: Secondary | ICD-10-CM | POA: Diagnosis not present

## 2019-01-25 DIAGNOSIS — J45909 Unspecified asthma, uncomplicated: Secondary | ICD-10-CM | POA: Diagnosis present

## 2019-01-25 HISTORY — DX: Sicca syndrome, unspecified: M35.00

## 2019-01-25 HISTORY — DX: Unspecified asthma, uncomplicated: J45.909

## 2019-01-25 LAB — COMPREHENSIVE METABOLIC PANEL
ALT: 17 U/L (ref 0–44)
AST: 19 U/L (ref 15–41)
Albumin: 3.8 g/dL (ref 3.5–5.0)
Alkaline Phosphatase: 86 U/L (ref 38–126)
Anion gap: 5 (ref 5–15)
BUN: 18 mg/dL (ref 8–23)
CO2: 23 mmol/L (ref 22–32)
Calcium: 9 mg/dL (ref 8.9–10.3)
Chloride: 102 mmol/L (ref 98–111)
Creatinine, Ser: 0.77 mg/dL (ref 0.44–1.00)
GFR calc Af Amer: >60 mL/min (ref >60)
GFR calc non Af Amer: >60 mL/min (ref >60)
Glucose, Bld: 96 mg/dL (ref 70–99)
Potassium: 3.4 mmol/L — ABNORMAL LOW (ref 3.5–5.1)
Sodium: 137 mmol/L (ref 135–145)
Total Bilirubin: 0.9 mg/dL (ref 0.3–1.2)
Total Protein: 7 g/dL (ref 6.5–8.1)

## 2019-01-25 LAB — URINALYSIS, ROUTINE W REFLEX MICROSCOPIC
Bilirubin Urine: NEGATIVE
Glucose, UA: NEGATIVE mg/dL
Ketones, ur: 40 mg/dL — AB
Nitrite: POSITIVE — AB
Protein, ur: 30 mg/dL — AB
Specific Gravity, Urine: 1.025 (ref 1.005–1.030)
pH: 6 (ref 5.0–8.0)

## 2019-01-25 LAB — CBC WITH DIFFERENTIAL/PLATELET
Abs Immature Granulocytes: 0.11 10*3/uL — ABNORMAL HIGH (ref 0.00–0.07)
Basophils Absolute: 0 10*3/uL (ref 0.0–0.1)
Basophils Relative: 0 %
Eosinophils Absolute: 0 10*3/uL (ref 0.0–0.5)
Eosinophils Relative: 0 %
HCT: 37.1 % (ref 36.0–46.0)
Hemoglobin: 11.7 g/dL — ABNORMAL LOW (ref 12.0–15.0)
Immature Granulocytes: 1 %
Lymphocytes Relative: 6 %
Lymphs Abs: 1.1 10*3/uL (ref 0.7–4.0)
MCH: 28.5 pg (ref 26.0–34.0)
MCHC: 31.5 g/dL (ref 30.0–36.0)
MCV: 90.3 fL (ref 80.0–100.0)
Monocytes Absolute: 0.7 10*3/uL (ref 0.1–1.0)
Monocytes Relative: 4 %
Neutro Abs: 15.2 10*3/uL — ABNORMAL HIGH (ref 1.7–7.7)
Neutrophils Relative %: 89 %
Platelets: 272 10*3/uL (ref 150–400)
RBC: 4.11 MIL/uL (ref 3.87–5.11)
RDW: 12.9 % (ref 11.5–15.5)
WBC: 17.1 10*3/uL — ABNORMAL HIGH (ref 4.0–10.5)
nRBC: 0 % (ref 0.0–0.2)

## 2019-01-25 LAB — URINALYSIS, MICROSCOPIC (REFLEX)

## 2019-01-25 LAB — LIPASE, BLOOD: Lipase: 26 U/L (ref 11–51)

## 2019-01-25 IMAGING — CT CT ABDOMEN AND PELVIS WITH CONTRAST
2 of 5 series · 16 of 46 positions shown, 18 images · IV contrast (APPLIED)
Comparison: None.

CLINICAL DATA: Diffuse abdominal pain. History of GERD, nausea.

EXAM:
CT ABDOMEN AND PELVIS WITH CONTRAST
TECHNIQUE: Multidetector CT imaging of the abdomen and pelvis was performed
using the standard protocol following bolus administration of
intravenous contrast.
CONTRAST:  100mL OMNIPAQUE IOHEXOL 300 MG/ML  SOLN

[Series 2: axial st · axial · 0.83mm/px · z∈[+405,+805]mm · 13 of 90 slices shown, 15 images]
[im 5/90  soft-tissue]
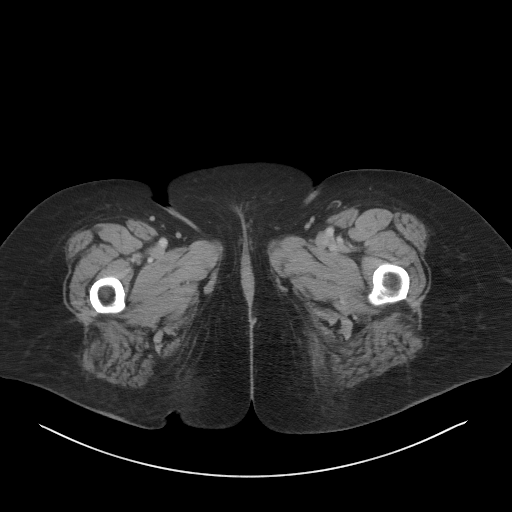
[im 5/90  bone]
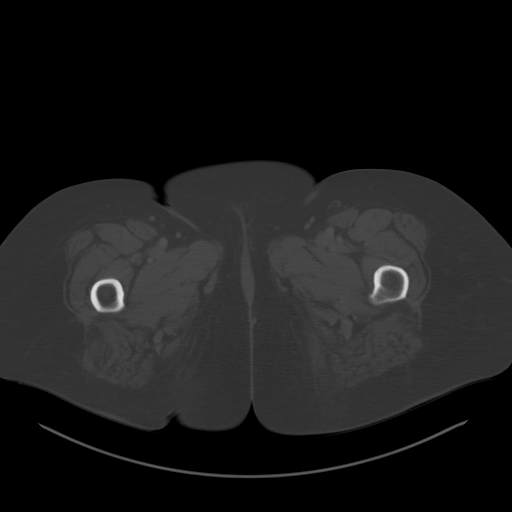
[im 15/90  soft-tissue]
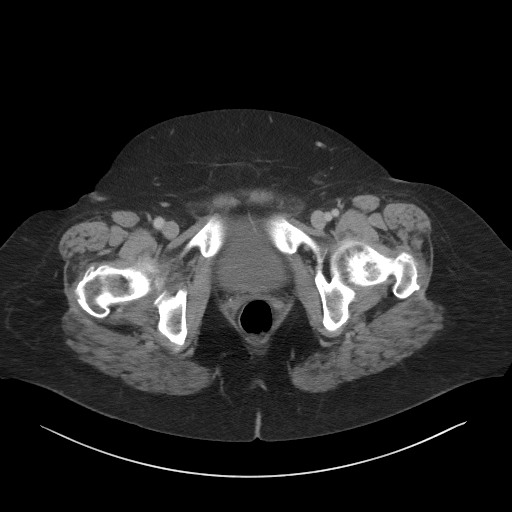
[im 19/90  soft-tissue]
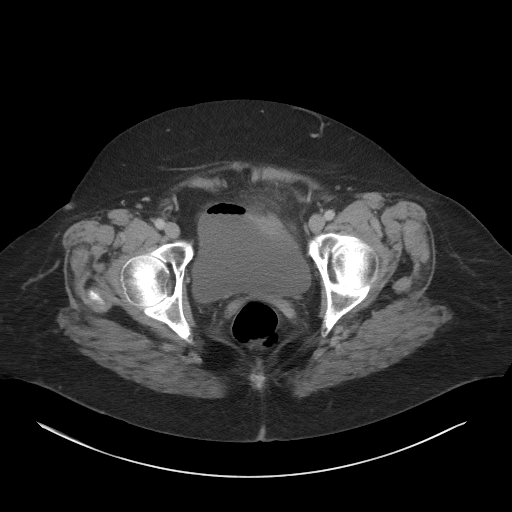
[im 24/90  soft-tissue]
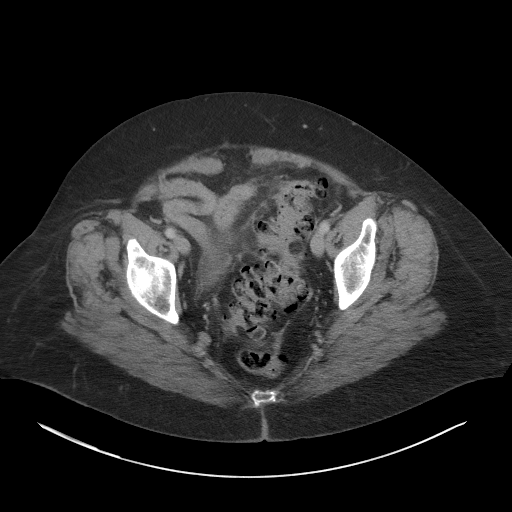
[im 33/90  soft-tissue]
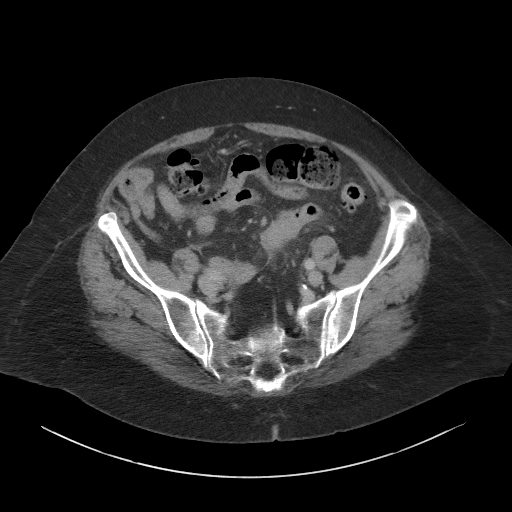
[im 38/90  soft-tissue]
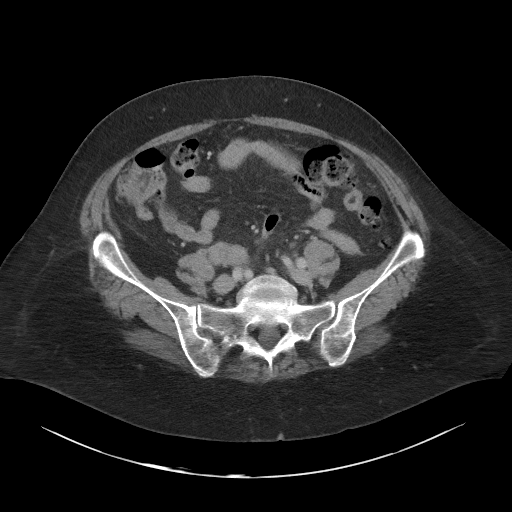
[im 47/90  soft-tissue]
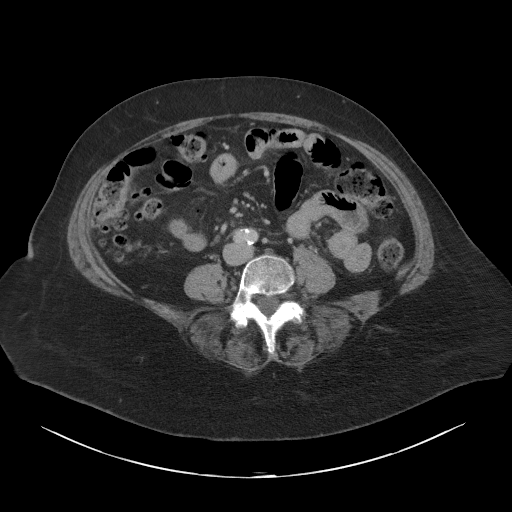
[im 52/90  soft-tissue]
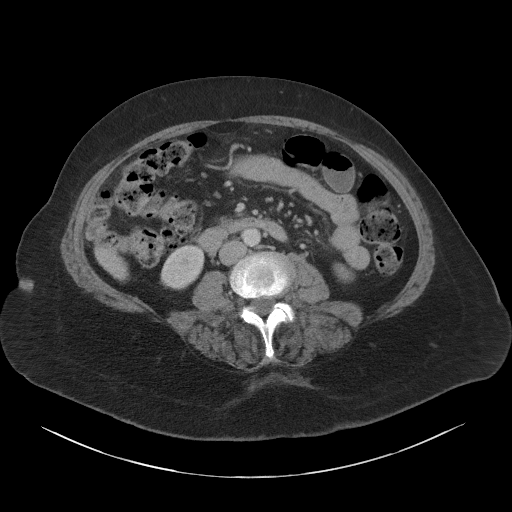
[im 57/90  soft-tissue]
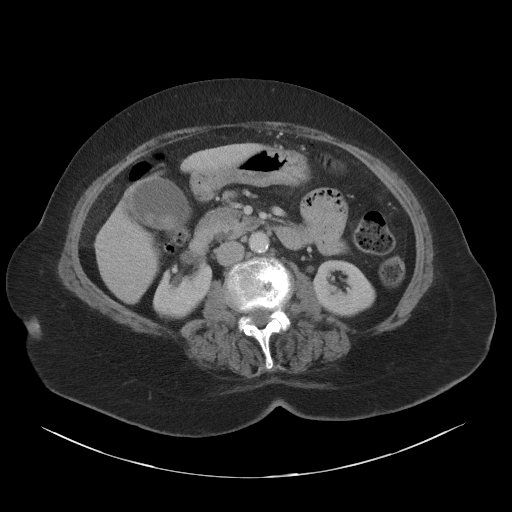
[im 57/90  bone]
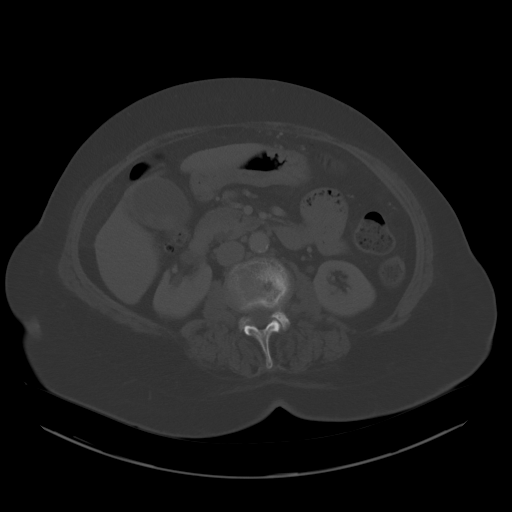
[im 66/90  soft-tissue]
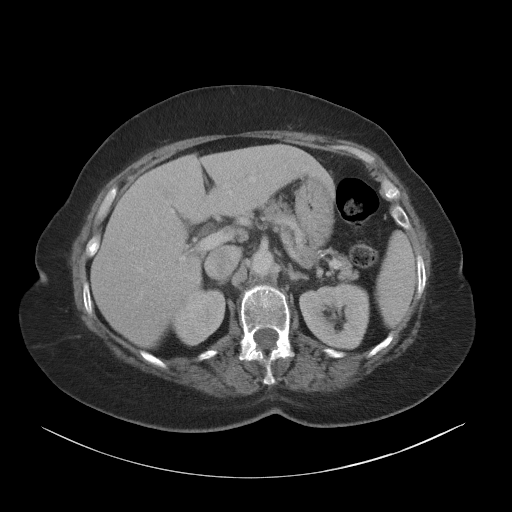
[im 71/90  soft-tissue]
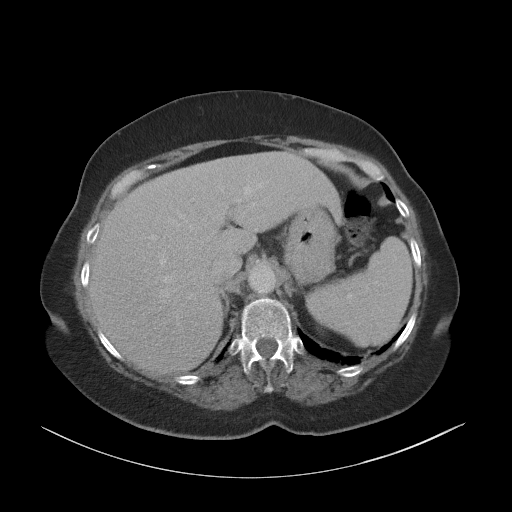
[im 75/90  soft-tissue]
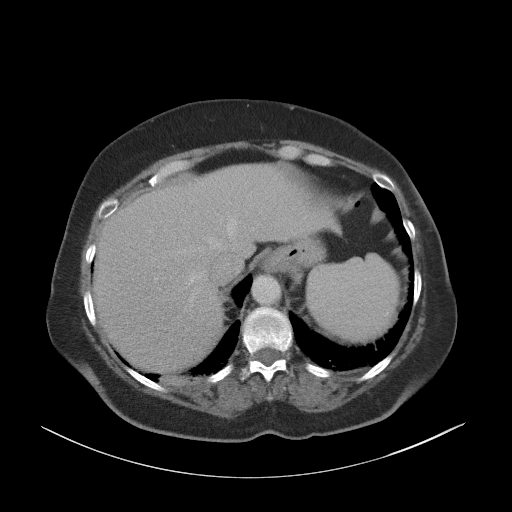
[im 85/90  soft-tissue]
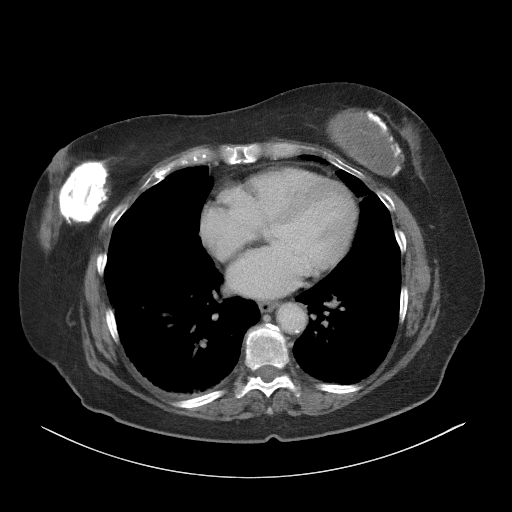

[Series 5: coronal st · coronal · 0.87mm/px · 3 of 101 slices shown]
[im 34/101  soft-tissue]
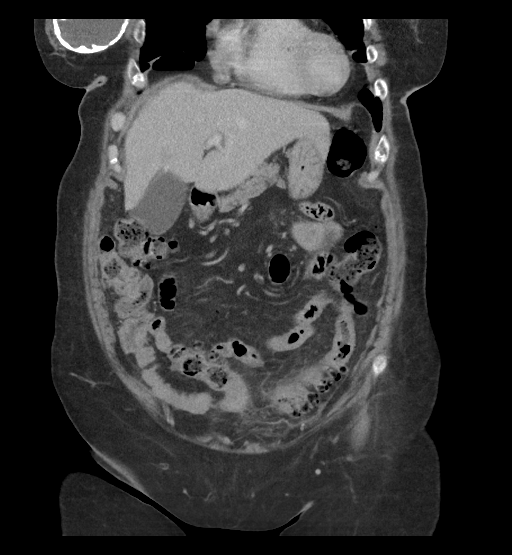
[im 45/101  soft-tissue]
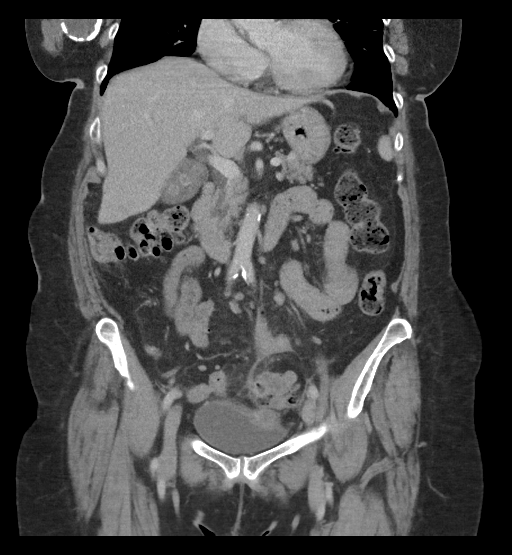
[im 56/101  soft-tissue]
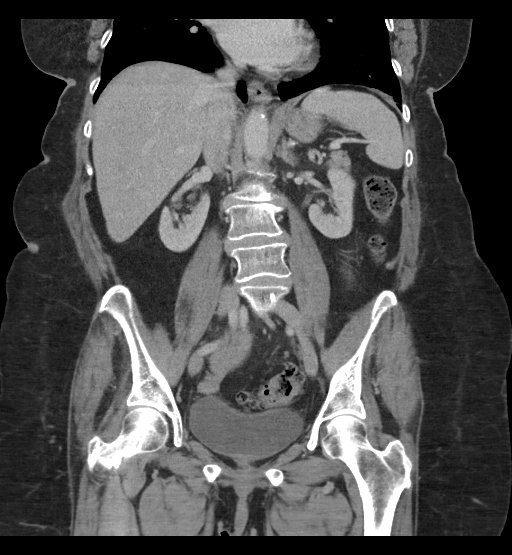

[16 of 46 positions shown; findings below may reference images not displayed]

FINDINGS: Lower chest: Mild bibasilar scarring/atelectasis.

Hepatobiliary: Numerous stones within the a otherwise unremarkable
gallbladder. No focal liver abnormality. No significant bile duct
dilatation seen.

Pancreas: Unremarkable. No pancreatic ductal dilatation or
surrounding inflammatory changes.

Spleen: Normal in size without focal abnormality.

Adrenals/Urinary Tract: Adrenal glands appear normal. Kidneys are
unremarkable without mass, stone or hydronephrosis. No perinephric
fluid. Air within the bladder.

Stomach/Bowel: Extensive diverticulosis throughout the colon.
Inflammation/fluid stranding is seen about the sigmoid colon
indicating acute diverticulitis. The inflamed segment of bowel is
contiguous with the anterior-superior margin of the bladder
concerning for fistula.

No dilated large or small bowel loops. Stomach is unremarkable,
decompressed. Appendix is normal.

Vascular/Lymphatic: Aortic atherosclerosis. No acute appearing
vascular abnormality. Numerous small lymph nodes within the lower
mesentery, likely reactive in nature.

Reproductive: Presumed hysterectomy. No adnexal mass.

Other: Small amount of free intraperitoneal air within the central
abdomen and upper abdomen. No abscess collection seen.

Musculoskeletal: No acute or suspicious osseous finding.
IMPRESSION: 1. Diverticulitis of the sigmoid colon. The inflamed segment of
sigmoid colon is contiguous with the anterior-superior margin of the
bladder indicating colovesical fistula, with associated air in the
bladder. No circumscribed abscess collection identified.
2. Free intraperitoneal air within the abdomen and pelvis indicating
associated bowel perforation, likely micro perforation given the
small amount of free air.
3. Cholelithiasis without evidence of acute cholecystitis.

These results were called by telephone at the time of interpretation
on [DATE] at [DATE] to Dr. ANTONIETA , who verbally
acknowledged these results.

Aortic Atherosclerosis ([KI]-[KI]).

## 2019-01-25 MED ORDER — PIPERACILLIN-TAZOBACTAM 3.375 G IVPB
3.3750 g | Freq: Three times a day (TID) | INTRAVENOUS | Status: DC
  Administered 2019-01-26 – 2019-02-06 (×35): 3.375 g via INTRAVENOUS
  Filled 2019-01-25 (×40): qty 50

## 2019-01-25 MED ORDER — SODIUM CHLORIDE 0.9 % IV SOLN
INTRAVENOUS | Status: DC | PRN
  Administered 2019-01-25: 19:00:00 250 mL via INTRAVENOUS
  Administered 2019-01-28: 03:00:00 1000 mL via INTRAVENOUS
  Administered 2019-01-28 – 2019-02-04 (×5): 250 mL via INTRAVENOUS

## 2019-01-25 MED ORDER — SODIUM CHLORIDE 0.9 % IV BOLUS
1000.0000 mL | Freq: Once | INTRAVENOUS | Status: AC
  Administered 2019-01-25: 16:00:00 1000 mL via INTRAVENOUS

## 2019-01-25 MED ORDER — MORPHINE SULFATE (PF) 4 MG/ML IV SOLN
6.0000 mg | Freq: Once | INTRAVENOUS | Status: AC
  Administered 2019-01-25: 6 mg via INTRAVENOUS
  Filled 2019-01-25: qty 2

## 2019-01-25 MED ORDER — PIPERACILLIN-TAZOBACTAM 3.375 G IVPB 30 MIN
3.3750 g | Freq: Four times a day (QID) | INTRAVENOUS | Status: DC
  Filled 2019-01-25 (×2): qty 50

## 2019-01-25 MED ORDER — ONDANSETRON HCL 4 MG/2ML IJ SOLN
4.0000 mg | Freq: Four times a day (QID) | INTRAMUSCULAR | Status: DC | PRN
  Administered 2019-01-26 – 2019-02-07 (×15): 4 mg via INTRAVENOUS
  Filled 2019-01-25 (×16): qty 2

## 2019-01-25 MED ORDER — MORPHINE SULFATE (PF) 4 MG/ML IV SOLN
4.0000 mg | INTRAVENOUS | Status: DC | PRN
  Filled 2019-01-25: qty 1

## 2019-01-25 MED ORDER — ONDANSETRON HCL 4 MG/2ML IJ SOLN
INTRAMUSCULAR | Status: AC
  Filled 2019-01-25: qty 2

## 2019-01-25 MED ORDER — PIPERACILLIN-TAZOBACTAM 3.375 G IVPB 30 MIN
3.3750 g | Freq: Once | INTRAVENOUS | Status: AC
  Administered 2019-01-25: 19:00:00 3.375 g via INTRAVENOUS
  Filled 2019-01-25 (×2): qty 50

## 2019-01-25 MED ORDER — HYDROCODONE-ACETAMINOPHEN 5-325 MG PO TABS: 1.0000 | ORAL_TABLET | ORAL | Status: DC | PRN

## 2019-01-25 MED ORDER — IOHEXOL 300 MG/ML  SOLN
100.0000 mL | Freq: Once | INTRAMUSCULAR | Status: AC | PRN
  Administered 2019-01-25: 18:00:00 100 mL via INTRAVENOUS

## 2019-01-25 MED ORDER — MORPHINE SULFATE (PF) 2 MG/ML IV SOLN
1.0000 mg | INTRAVENOUS | Status: DC | PRN
  Filled 2019-01-25: qty 1

## 2019-01-25 MED ORDER — ACETAMINOPHEN 650 MG RE SUPP: 650.0000 mg | Freq: Four times a day (QID) | RECTAL | Status: DC | PRN

## 2019-01-25 MED ORDER — INFLUENZA VAC SPLIT HIGH-DOSE 0.5 ML IM SUSY
0.5000 mL | PREFILLED_SYRINGE | INTRAMUSCULAR | Status: AC
  Administered 2019-01-26: 16:00:00 0.5 mL via INTRAMUSCULAR
  Filled 2019-01-25: qty 0.5

## 2019-01-25 MED ORDER — ONDANSETRON HCL 4 MG/2ML IJ SOLN
4.0000 mg | Freq: Once | INTRAMUSCULAR | Status: DC
  Filled 2019-01-25: qty 2

## 2019-01-25 MED ORDER — ACETAMINOPHEN 325 MG PO TABS: 650.0000 mg | ORAL_TABLET | Freq: Four times a day (QID) | ORAL | Status: DC | PRN

## 2019-01-25 NOTE — ED Notes (Signed)
Scanner reported to Network engineer

## 2019-01-25 NOTE — Care Plan (Addendum)
73 year old female who is been transferred from [facility] emergency room with acute worsening abdominal pain.  Per Dr Venora Maples, patient's abdominal pain has been worsening with associated nausea but no vomiting or diarrhea.  No associated urinary symptoms or fever.  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  CT scan of the abdomen and pelvis is consistent with diverticulitis of the sigmoid colon with inflamed segment of the sigmoid colon and contiguous with the anterior superior margin of the bladder indicating colovesicular fistula with associated air in the bladder.  No circumscribed abscess collection identified.  Also noted was free intraperitoneal air within the abdomen and pelvis indicating possible associated bowel perforation, likely microperforation given small amount of air. Labs Serum chemistry only significant for mild hypokalemia of 3.4. CBC was significant for leukocytosis 17.1 with a left shift. Urinalysis was consistent with positive nitrite, significant bacteria, low epithelial cells and pyuria consistent with urinary tract infection. Patient was started on IV piperacillin tazobactam along with IV fluids for possible intra-abdominal infection.  I will continue with Zosyn and IV fluids and will put patient on n.p.o. with consult to surgery (General and urologic surgeries). Spoke with Dr Tresa Moore (Urology and he will see patient tomorrow. Paged surgery Dr Vikki Ports but no call back at end of shift).. Continue with pain management. Patient is being transferred to [facility] floor and hospitalist was asked to request for bed placement and will be transferred as soon as bed availability. I accepted the patient and has placed a MedSurg bed.  I have not physically seen or evaluated patient. This care plan is based on information from Dr. Venora Maples and review of labs and imaging.

## 2019-01-25 NOTE — ED Notes (Signed)
ED Provider at bedside. 

## 2019-01-25 NOTE — ED Triage Notes (Signed)
Patient states that she is having abdominal pain generalizes - went to the urgent care and was sent here for a CT

## 2019-01-25 NOTE — ED Provider Notes (Signed)
[facility] EMERGENCY DEPARTMENT Provider Note   CSN: [id number] Arrival date & time: 01/25/19  1509    History   Chief Complaint Chief Complaint  Patient presents with   Abdominal Pain    HPI Denise Payne is a 73 y.o. female.     HPI Patient is a 73 year old female who presents to the emergency department with a worsening generalized and lower abdominal pain over the past 48 to 72 hours.  Some nausea.  No diarrhea.  Moving her bowels.  Feels distended.  Chills without documented fever.  Patient is never had pain or discomfort like this before.  No dysuria or urinary frequency.  She states intermittently over the past year she has had the passage of air while urinating.  She reports her last colonoscopy last year was without significant abnormality.  No known history of diverticulosis.   Past Medical History:  Diagnosis Date   Asthma     There are no active problems to display for this patient.   Past Surgical History:  Procedure Laterality Date   AUGMENTATION MAMMAPLASTY Bilateral 10/16/1976     OB History   No obstetric history on file.      Home Medications    Prior to Admission medications   Medication Sig Start Date End Date Taking? Authorizing Provider  aspirin EC 81 MG tablet Take by mouth.   Yes [provider]  diclofenac sodium (VOLTAREN) 1 % GEL Apply topically.   Yes [provider]  esomeprazole (NEXIUM) 40 MG capsule Take by mouth.   Yes [provider]  hydroxychloroquine (PLAQUENIL) 200 MG tablet Take by mouth.   Yes [provider]  Melatonin 10 MG TABS Take by mouth.   Yes [provider]  montelukast (SINGULAIR) 10 MG tablet Take by mouth.   Yes [provider]  vitamin B-12 (CYANOCOBALAMIN) 1000 MCG tablet Take by mouth.   Yes [provider]  Vitamin D, Ergocalciferol, (DRISDOL) 1.25 MG (50000 UT) CAPS capsule Take by mouth.   Yes [provider]    albuterol (PROVENTIL HFA;VENTOLIN HFA) 108 (90 Base) MCG/ACT inhaler Inhale into the lungs.    [provider]  cyclobenzaprine (FLEXERIL) 10 MG tablet Take by mouth.    [provider]  HYDROcodone-acetaminophen (NORCO/VICODIN) 5-325 MG tablet Take 1-2 tablets by mouth every 8 (eight) hours as needed. 09/28/18   Volanda Napoleon, PA-C    Family History Family History  Problem Relation Age of Onset   Breast cancer Mother 51    Social History Social History   Tobacco Use   Smoking status: Never Smoker   Smokeless tobacco: Never Used  Substance Use Topics   Alcohol use: Yes    Alcohol/week: 1.0 standard drinks    Types: 1 Glasses of wine per week   Drug use: Never     Allergies   Latex   Review of Systems Review of Systems  All other systems reviewed and are negative.    Physical Exam Updated Vital Signs BP (!) 162/89 (BP Location: Left Arm)    Pulse 91    Temp 98 F (36.7 C) (Oral)    Resp 18    Ht 5' 2.5" (1.588 m)    Wt 81.6 kg    SpO2 100%    BMI 32.40 kg/m   Physical Exam Vitals signs and nursing note reviewed.  Constitutional:      General: She is not in acute distress.    Appearance: She is well-developed.  HENT:     Head: Normocephalic and atraumatic.  Neck:     Musculoskeletal: Normal range of motion.  Cardiovascular:     Rate and Rhythm: Normal rate and regular rhythm.     Heart sounds: Normal heart sounds.  Pulmonary:     Effort: Pulmonary effort is normal.     Breath sounds: Normal breath sounds.  Abdominal:     General: There is distension.     Palpations: Abdomen is soft.     Comments: Lower abdominal tenderness with out guarding or rebound.  No peritoneal signs  Musculoskeletal: Normal range of motion.  Skin:    General: Skin is warm and dry.  Neurological:     Mental Status: She is alert and oriented to person, place, and time.  Psychiatric:        Judgment: Judgment normal.      ED Treatments / Results   Labs (all labs ordered are listed, but only abnormal results are displayed) Labs Reviewed  CBC WITH DIFFERENTIAL/PLATELET - Abnormal; Notable for the following components:      Result Value   WBC 17.1 (*)    Hemoglobin 11.7 (*)    Neutro Abs 15.2 (*)    Abs Immature Granulocytes 0.11 (*)    All other components within normal limits  COMPREHENSIVE METABOLIC PANEL - Abnormal; Notable for the following components:   Potassium 3.4 (*)    All other components within normal limits  URINALYSIS, ROUTINE W REFLEX MICROSCOPIC - Abnormal; Notable for the following components:   Color, Urine AMBER (*)    APPearance CLOUDY (*)    Hgb urine dipstick SMALL (*)    Ketones, ur 40 (*)    Protein, ur 30 (*)    Nitrite POSITIVE (*)    Leukocytes,Ua TRACE (*)    All other components within normal limits  URINALYSIS, MICROSCOPIC (REFLEX) - Abnormal; Notable for the following components:   Bacteria, UA MANY (*)    All other components within normal limits  LIPASE, BLOOD    EKG None  Radiology Ct Abdomen Pelvis W Contrast  Result Date: 01/25/2019 CLINICAL DATA:  Diffuse abdominal pain. History of GERD, nausea. EXAM: CT ABDOMEN AND PELVIS WITH CONTRAST TECHNIQUE: Multidetector CT imaging of the abdomen and pelvis was performed using the standard protocol following bolus administration of intravenous contrast. CONTRAST:  112mL OMNIPAQUE IOHEXOL 300 MG/ML  SOLN COMPARISON:  None. FINDINGS: Lower chest: Mild bibasilar scarring/atelectasis. Hepatobiliary: Numerous stones within the a otherwise unremarkable gallbladder. No focal liver abnormality. No significant bile duct dilatation seen. Pancreas: Unremarkable. No pancreatic ductal dilatation or surrounding inflammatory changes. Spleen: Normal in size without focal abnormality. Adrenals/Urinary Tract: Adrenal glands appear normal. Kidneys are unremarkable without mass, stone or hydronephrosis. No perinephric fluid. Air within the bladder. Stomach/Bowel:  Extensive diverticulosis throughout the colon. Inflammation/fluid stranding is seen about the sigmoid colon indicating acute diverticulitis. The inflamed segment of bowel is contiguous with the anterior-superior margin of the bladder concerning for fistula. No dilated large or small bowel loops. Stomach is unremarkable, decompressed. Appendix is normal. Vascular/Lymphatic: Aortic atherosclerosis. No acute appearing vascular abnormality. Numerous small lymph nodes within the lower mesentery, likely reactive in nature. Reproductive: Presumed hysterectomy. No adnexal mass. Other: Small amount of free intraperitoneal air within the central abdomen and upper abdomen. No abscess collection seen. Musculoskeletal: No acute or suspicious osseous finding. IMPRESSION: 1. Diverticulitis of the sigmoid colon. The inflamed segment of sigmoid colon is contiguous with the anterior-superior margin of the bladder indicating colovesical fistula, with  associated air in the bladder. No circumscribed abscess collection identified. 2. Free intraperitoneal air within the abdomen and pelvis indicating associated bowel perforation, likely micro perforation given the small amount of free air. 3. Cholelithiasis without evidence of acute cholecystitis. These results were called by telephone at the time of interpretation on 01/25/2019 at 6:25 pm to Dr. Jola Schmidt , who verbally acknowledged these results. Aortic Atherosclerosis (ICD10-I70.0). Electronically Signed   By: Franki Cabot M.D.   On: 01/25/2019 18:27    Procedures .Critical Care Performed by: Jola Schmidt, MD Authorized by: Jola Schmidt, MD   Critical care provider statement:    Critical care time (minutes):  32   Critical care was time spent personally by me on the following activities:  Discussions with consultants, evaluation of patient's response to treatment, examination of patient, ordering and performing treatments and interventions, ordering and review of  laboratory studies, ordering and review of radiographic studies, pulse oximetry, re-evaluation of patient's condition, obtaining history from patient or surrogate and review of old charts   (including critical care time)  Medications Ordered in ED Medications  piperacillin-tazobactam (ZOSYN) IVPB 3.375 g (3.375 g Intravenous New Bag/Given 01/25/19 1857)  0.9 %  sodium chloride infusion (250 mLs Intravenous New Bag/Given 01/25/19 1856)  morphine 4 MG/ML injection 6 mg (6 mg Intravenous Given 01/25/19 1627)  sodium chloride 0.9 % bolus 1,000 mL (0 mLs Intravenous Stopped 01/25/19 1818)  iohexol (OMNIPAQUE) 300 MG/ML solution 100 mL (100 mLs Intravenous Contrast Given 01/25/19 1734)  morphine 4 MG/ML injection 6 mg (6 mg Intravenous Given 01/25/19 1900)     Initial Impression / Assessment and Plan / ED Course  I have reviewed the triage vital signs and the nursing notes.  Pertinent labs & imaging results that were available during my care of the patient were reviewed by me and considered in my medical decision making (see chart for details).       Bowel perforation.  IV Zosyn.  Patient will need admission to the hospital.  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  Urine culture will be sent.  Admission to [facility] to be admitted to the hospitalist service.  She will benefit from general surgery consultation in the a.m.  No indication for emergent surgery.  Patient updated.  Patient's daughter updated as well.  All questions answered.   Final Clinical Impressions(s) / ED Diagnoses   Final diagnoses:  Diverticulitis  Bowel perforation [facility])    ED Discharge Orders    None       Jola Schmidt, MD 01/25/19 1947

## 2019-01-26 ENCOUNTER — Encounter (HOSPITAL_COMMUNITY): Payer: Self-pay | Admitting: Family Medicine

## 2019-01-26 DIAGNOSIS — J45909 Unspecified asthma, uncomplicated: Secondary | ICD-10-CM | POA: Diagnosis present

## 2019-01-26 DIAGNOSIS — M3500 Sicca syndrome, unspecified: Secondary | ICD-10-CM | POA: Diagnosis present

## 2019-01-26 HISTORY — DX: Sjogren syndrome, unspecified: M35.00

## 2019-01-26 LAB — COMPREHENSIVE METABOLIC PANEL
ALT: 15 U/L (ref 0–44)
AST: 14 U/L — ABNORMAL LOW (ref 15–41)
Albumin: 2.9 g/dL — ABNORMAL LOW (ref 3.5–5.0)
Alkaline Phosphatase: 84 U/L (ref 38–126)
Anion gap: 10 (ref 5–15)
BUN: 14 mg/dL (ref 8–23)
CO2: 21 mmol/L — ABNORMAL LOW (ref 22–32)
Calcium: 8.6 mg/dL — ABNORMAL LOW (ref 8.9–10.3)
Chloride: 106 mmol/L (ref 98–111)
Creatinine, Ser: 0.82 mg/dL (ref 0.44–1.00)
GFR calc Af Amer: >60 mL/min (ref >60)
GFR calc non Af Amer: >60 mL/min (ref >60)
Glucose, Bld: 88 mg/dL (ref 70–99)
Potassium: 3.9 mmol/L (ref 3.5–5.1)
Sodium: 137 mmol/L (ref 135–145)
Total Bilirubin: 1 mg/dL (ref 0.3–1.2)
Total Protein: 6.1 g/dL — ABNORMAL LOW (ref 6.5–8.1)

## 2019-01-26 LAB — CBC
HCT: 34.6 % — ABNORMAL LOW (ref 36.0–46.0)
Hemoglobin: 10.9 g/dL — ABNORMAL LOW (ref 12.0–15.0)
MCH: 27.8 pg (ref 26.0–34.0)
MCHC: 31.5 g/dL (ref 30.0–36.0)
MCV: 88.3 fL (ref 80.0–100.0)
Platelets: 247 10*3/uL (ref 150–400)
RBC: 3.92 MIL/uL (ref 3.87–5.11)
RDW: 13 % (ref 11.5–15.5)
WBC: 17.8 10*3/uL — ABNORMAL HIGH (ref 4.0–10.5)
nRBC: 0 % (ref 0.0–0.2)

## 2019-01-26 MED ORDER — PANTOPRAZOLE SODIUM 40 MG IV SOLR
40.0000 mg | Freq: Every day | INTRAVENOUS | Status: DC
  Administered 2019-01-26 – 2019-02-02 (×9): 40 mg via INTRAVENOUS
  Filled 2019-01-26 (×9): qty 40

## 2019-01-26 MED ORDER — ALBUTEROL SULFATE (2.5 MG/3ML) 0.083% IN NEBU: 3.0000 mL | INHALATION_SOLUTION | Freq: Four times a day (QID) | RESPIRATORY_TRACT | Status: DC | PRN

## 2019-01-26 MED ORDER — MORPHINE SULFATE (PF) 4 MG/ML IV SOLN
3.0000 mg | INTRAVENOUS | Status: DC | PRN
  Administered 2019-01-26 – 2019-01-29 (×4): 4 mg via INTRAVENOUS
  Filled 2019-01-26 (×5): qty 1

## 2019-01-26 MED ORDER — HYDROXYCHLOROQUINE SULFATE 200 MG PO TABS
200.0000 mg | ORAL_TABLET | Freq: Two times a day (BID) | ORAL | Status: DC
  Administered 2019-01-26 – 2019-02-08 (×27): 200 mg via ORAL
  Filled 2019-01-26 (×29): qty 1

## 2019-01-26 MED ORDER — POTASSIUM CHLORIDE IN NACL 20-0.45 MEQ/L-% IV SOLN
INTRAVENOUS | Status: DC
  Administered 2019-01-26: 02:00:00 via INTRAVENOUS
  Filled 2019-01-26: qty 1000

## 2019-01-26 MED ORDER — DEXTROSE-NACL 5-0.9 % IV SOLN
INTRAVENOUS | Status: DC
  Administered 2019-01-26 – 2019-01-28 (×5): via INTRAVENOUS

## 2019-01-26 NOTE — Progress Notes (Signed)
PROGRESS NOTE    Denise Payne  NIO:[id number] DOB: 07-24-1946 DOA: 01/25/2019 PCP: Leeroy Cha, MD   Brief Narrative: Patient is a 73 year old female with history of asthma, Sjogren's syndrome who presents to the emergency department for the evaluation of abdominal pain.  She reported progressive generalized abdominal pain over the last 2 to 3 days with some nausea but no vomiting or diarrhea.  Reported passage of air during urination intermittently over the past year.  CT abdomen/pelvis was concerning for diverticulitis of the sigmoid colon with the inflamed segment contiguous with the anterior superior margin of the urinary bladder indicating a colovesical fistula with associated air  in the bladder.  CT also noted intraperitoneal free air suggesting microperforation.  Urology consulted and following.  Started on conservative management with antibiotics and IV fluids with pain management.  Assessment & Plan:   Principal Problem:   Diverticulitis of large intestine with perforation Active Problems:   Colovesical fistula   Asthma   Sjogren's syndrome ([place])   Diverticulitis with microperforation/colovesicular fistula: Presented with 2 to 3 days of progressive abdominal pain with nausea.  No diarrhea, melena or hematochezia.  Found to be hemodynamically stable on presentation with leukocytosis.  CT finding as above. Urology consulted and following.  Started on Zosyn.  Will follow up urine culture.  Pain well controlled. Continue conservative management with bowel rest, pain medications, serial abdominal examination.  Asthma: Currently stable.  No cough or wheezing.  Continue as needed inhalers  History of Sjogren syndrome: On Plaquenil.  Stable          DVT prophylaxis:SCD Code Status: Full Family Communication: None present at the bed side  Disposition Plan: Home after resolution of abdominal pain, ability to tolerate food   Consultants: Urology  Procedures:  None  Antimicrobials:  Anti-infectives (From admission, onward)   Start     Dose/Rate Route Frequency Ordered Stop   01/26/19 1000  hydroxychloroquine (PLAQUENIL) tablet 200 mg     200 mg Oral 2 times daily 01/26/19 0041     01/26/19 0000  piperacillin-tazobactam (ZOSYN) IVPB 3.375 g  Status:  Discontinued     3.375 g 100 mL/hr over 30 Minutes Intravenous Every 6 hours 01/25/19 2138 01/25/19 2325   01/26/19 0000  piperacillin-tazobactam (ZOSYN) IVPB 3.375 g     3.375 g 12.5 mL/hr over 240 Minutes Intravenous Every 8 hours 01/25/19 2325     01/25/19 1845  piperacillin-tazobactam (ZOSYN) IVPB 3.375 g     3.375 g 100 mL/hr over 30 Minutes Intravenous  Once 01/25/19 1832 01/25/19 1927      Subjective: Patient seen and examined the bedside this morning.  Hemodynamically stable.  Afebrile.  Abdomen pain is controlled but is still there.  No bowel movement.  Passing gas.  Objective: Vitals:   01/25/19 2220 01/25/19 2333 01/26/19 0453 01/26/19 0805  BP: (!) 143/97 (!) 155/69 (!) 123/54 (!) 148/59  Pulse: 98 98 98 98  Resp: 18 18 18 18   Temp:  97.9 F (36.6 C) 98.3 F (36.8 C) 98.2 F (36.8 C)  TempSrc:  Oral Oral Oral  SpO2: 96% 96% 98% 98%  Weight:      Height:        Intake/Output Summary (Last 24 hours) at 01/26/2019 0940 Last data filed at 01/26/2019 0755 Gross per 24 hour  Intake 1466.75 ml  Output --  Net 1466.75 ml   Filed Weights   01/25/19 1537  Weight: 81.6 kg    Examination:  General exam: Appears calm  and comfortable ,Not in distress,average built HEENT:PERRL,Oral mucosa moist, Ear/Nose normal on gross exam Respiratory system: Bilateral equal air entry, normal vesicular breath sounds, no wheezes or crackles  Cardiovascular system: S1 & S2 heard, RRR. No JVD, murmurs, rubs, gallops or clicks. No pedal edema. Gastrointestinal system: Abdomen is nondistended, soft .  Generalised tenderness. No organomegaly or masses felt. Normal bowel sounds heard. Central  nervous system: Alert and oriented. No focal neurological deficits. Extremities: No edema, no clubbing ,no cyanosis, distal peripheral pulses palpable. Skin: No rashes, lesions or ulcers,no icterus ,no pallor MSK: Normal muscle bulk,tone ,power Psychiatry: Judgement and insight appear normal. Mood & affect appropriate.     Data Reviewed: I have personally reviewed following labs and imaging studies  CBC: Recent Labs  Lab 01/25/19 1623 01/26/19 0914  WBC 17.1* 17.8*  NEUTROABS 15.2*  --   HGB 11.7* 10.9*  HCT 37.1 34.6*  MCV 90.3 88.3  PLT 272 902   Basic Metabolic Panel: Recent Labs  Lab 01/25/19 1623  NA 137  K 3.4*  CL 102  CO2 23  GLUCOSE 96  BUN 18  CREATININE 0.77  CALCIUM 9.0   GFR: Estimated Creatinine Clearance: 62.7 mL/min (by C-G formula based on SCr of 0.77 mg/dL). Liver Function Tests: Recent Labs  Lab 01/25/19 1623  AST 19  ALT 17  ALKPHOS 86  BILITOT 0.9  PROT 7.0  ALBUMIN 3.8   Recent Labs  Lab 01/25/19 1623  LIPASE 26   No results for input(s): AMMONIA in the last 168 hours. Coagulation Profile: No results for input(s): INR, PROTIME in the last 168 hours. Cardiac Enzymes: No results for input(s): CKTOTAL, CKMB, CKMBINDEX, TROPONINI in the last 168 hours. BNP (last 3 results) No results for input(s): PROBNP in the last 8760 hours. HbA1C: No results for input(s): HGBA1C in the last 72 hours. CBG: No results for input(s): GLUCAP in the last 168 hours. Lipid Profile: No results for input(s): CHOL, HDL, LDLCALC, TRIG, CHOLHDL, LDLDIRECT in the last 72 hours. Thyroid Function Tests: No results for input(s): TSH, T4TOTAL, FREET4, T3FREE, THYROIDAB in the last 72 hours. Anemia Panel: No results for input(s): VITAMINB12, FOLATE, FERRITIN, TIBC, IRON, RETICCTPCT in the last 72 hours. Sepsis Labs: No results for input(s): PROCALCITON, LATICACIDVEN in the last 168 hours.  No results found for this or any previous visit (from the past 240  hour(s)).       Radiology Studies: Ct Abdomen Pelvis W Contrast  Result Date: 01/25/2019 CLINICAL DATA:  Diffuse abdominal pain. History of GERD, nausea. EXAM: CT ABDOMEN AND PELVIS WITH CONTRAST TECHNIQUE: Multidetector CT imaging of the abdomen and pelvis was performed using the standard protocol following bolus administration of intravenous contrast. CONTRAST:  159mL OMNIPAQUE IOHEXOL 300 MG/ML  SOLN COMPARISON:  None. FINDINGS: Lower chest: Mild bibasilar scarring/atelectasis. Hepatobiliary: Numerous stones within the a otherwise unremarkable gallbladder. No focal liver abnormality. No significant bile duct dilatation seen. Pancreas: Unremarkable. No pancreatic ductal dilatation or surrounding inflammatory changes. Spleen: Normal in size without focal abnormality. Adrenals/Urinary Tract: Adrenal glands appear normal. Kidneys are unremarkable without mass, stone or hydronephrosis. No perinephric fluid. Air within the bladder. Stomach/Bowel: Extensive diverticulosis throughout the colon. Inflammation/fluid stranding is seen about the sigmoid colon indicating acute diverticulitis. The inflamed segment of bowel is contiguous with the anterior-superior margin of the bladder concerning for fistula. No dilated large or small bowel loops. Stomach is unremarkable, decompressed. Appendix is normal. Vascular/Lymphatic: Aortic atherosclerosis. No acute appearing vascular abnormality. Numerous small lymph nodes within the  lower mesentery, likely reactive in nature. Reproductive: Presumed hysterectomy. No adnexal mass. Other: Small amount of free intraperitoneal air within the central abdomen and upper abdomen. No abscess collection seen. Musculoskeletal: No acute or suspicious osseous finding. IMPRESSION: 1. Diverticulitis of the sigmoid colon. The inflamed segment of sigmoid colon is contiguous with the anterior-superior margin of the bladder indicating colovesical fistula, with associated air in the bladder. No  circumscribed abscess collection identified. 2. Free intraperitoneal air within the abdomen and pelvis indicating associated bowel perforation, likely micro perforation given the small amount of free air. 3. Cholelithiasis without evidence of acute cholecystitis. These results were called by telephone at the time of interpretation on 01/25/2019 at 6:25 pm to Dr. Jola Schmidt , who verbally acknowledged these results. Aortic Atherosclerosis (ICD10-I70.0). Electronically Signed   By: Franki Cabot M.D.   On: 01/25/2019 18:27        Scheduled Meds:  hydroxychloroquine  200 mg Oral BID   Influenza vac split quadrivalent PF  0.5 mL Intramuscular Tomorrow-1000   ondansetron (ZOFRAN) IV  4 mg Intravenous Once   pantoprazole (PROTONIX) IV  40 mg Intravenous QHS   Continuous Infusions:  sodium chloride Stopped (01/26/19 0203)   dextrose 5 % and 0.9% NaCl     piperacillin-tazobactam (ZOSYN)  IV 3.375 g (01/26/19 0759)     LOS: 1 day    Time spent: 35 mins.More than 50% of that time was spent in counseling and/or coordination of care.      Shelly Coss, MD [facility] Pager [phone]  If 7PM-7AM, please contact night-coverage [email or web address] Password [facility] 01/26/2019, 9:40 AM

## 2019-01-26 NOTE — H&P (Signed)
History and Physical    Denise Payne LXB:[id number] DOB: [birth year] DOA: 01/25/2019  PCP: Leeroy Cha, MD   Patient coming from: Home  Chief Complaint: Abdominal pain   HPI: Denise Payne is a 73 y.o. female with medical history significant for asthma and Sjogren syndrome, presenting to emergency department for evaluation of abdominal pain.  Patient reported progressive generalized abdominal pain over the preceding 2 to 3 days with some nausea but no vomiting or diarrhea.  She has never experienced pain like this before.  She reported some chills without fevers.  Patient reported having a colonoscopy last year that she believes was negative for any significant abnormality.  Of note, she reports passage of air during urination intermittently over the past year.  Denise Payne [place] ED Course: Upon arrival to the ED, patient is found to be afebrile, saturating well on room air, and remaining vitals also normal.  Chemistry panel features a slight hypokalemia and CBC is notable for leukocytosis to 17,100.  Urinalysis is nitrite positive.  CT the abdomen and pelvis is concerning for diverticulitis of the sigmoid colon with the inflamed segment contiguous with the anterior-superior margin of the urinary bladder indicating a colovesical fistula with associated air in the bladder.  Also noted on CT is a small amount of free intraperitoneal air suggestive of microperforation.  Patient was given a liter of normal saline, morphine, Zofran, and started on Zosyn in the ED.  She remained hemodynamically stable and transferred to [facility] was arranged for admission.  Review of Systems:  All other systems reviewed and apart from HPI, are negative.  Past Medical History:  Diagnosis Date  . Asthma     Past Surgical History:  Procedure Laterality Date  . AUGMENTATION MAMMAPLASTY Bilateral 10/16/1976     reports that she has never smoked. She has never used smokeless tobacco.  She reports current alcohol use of about 1.0 standard drinks of alcohol per week. She reports that she does not use drugs.  Allergies  Allergen Reactions  . Latex     Family History  Problem Relation Age of Onset  . Breast cancer Mother 28     Prior to Admission medications   Medication Sig Start Date End Date Taking? Authorizing Provider  aspirin EC 81 MG tablet Take by mouth.   Yes [provider]  diclofenac sodium (VOLTAREN) 1 % GEL Apply topically.   Yes [provider]  esomeprazole (NEXIUM) 40 MG capsule Take by mouth.   Yes [provider]  hydroxychloroquine (PLAQUENIL) 200 MG tablet Take by mouth.   Yes [provider]  Melatonin 10 MG TABS Take by mouth.   Yes [provider]  montelukast (SINGULAIR) 10 MG tablet Take by mouth.   Yes [provider]  vitamin B-12 (CYANOCOBALAMIN) 1000 MCG tablet Take by mouth.   Yes [provider]  Vitamin D, Ergocalciferol, (DRISDOL) 1.25 MG (50000 UT) CAPS capsule Take by mouth.   Yes [provider]  albuterol (PROVENTIL HFA;VENTOLIN HFA) 108 (90 Base) MCG/ACT inhaler Inhale into the lungs.    [provider]  cyclobenzaprine (FLEXERIL) 10 MG tablet Take by mouth.    [provider]  HYDROcodone-acetaminophen (NORCO/VICODIN) 5-325 MG tablet Take 1-2 tablets by mouth every 8 (eight) hours as needed. 09/28/18   Denise Napoleon, PA-C    Physical Exam: Vitals:   01/25/19 1537 01/25/19 1942 01/25/19 2220 01/25/19 2333  BP:  (!) 154/70 (!) 143/97 (!) 155/69  Pulse:  100  98 98  Resp:  18 18 18   Temp:    97.9 F (36.6 C)  TempSrc:    Oral  SpO2:  95% 96% 96%  Weight: 81.6 kg     Height: 5' 2.5" (1.588 m)       Constitutional: NAD, calm  Eyes: PERTLA, lids and conjunctivae normal ENMT: Mucous membranes are moist. Posterior pharynx clear of any exudate or lesions.   Neck: normal, supple, no masses, no thyromegaly Respiratory: clear to  auscultation bilaterally, no wheezing, no crackles. Normal respiratory effort.   Cardiovascular: S1 & S2 heard, regular rate and rhythm. No extremity edema.   Abdomen: Soft. Generalized tenderness, worse in lower abdomen. She is not guarding. Bowel sounds active.  Musculoskeletal: no clubbing / cyanosis. No joint deformity upper and lower extremities.    Skin: no significant rashes, lesions, ulcers. Warm, dry, well-perfused. Neurologic: No facial asymmetry. Sensation intact. Moving all extremities.  Psychiatric: Alert and oriented x 3. Calm, cooperative.    Labs on Admission: I have personally reviewed following labs and imaging studies  CBC: Recent Labs  Lab 01/25/19 1623  WBC 17.1*  NEUTROABS 15.2*  HGB 11.7*  HCT 37.1  MCV 90.3  PLT 956   Basic Metabolic Panel: Recent Labs  Lab 01/25/19 1623  NA 137  K 3.4*  CL 102  CO2 23  GLUCOSE 96  BUN 18  CREATININE 0.77  CALCIUM 9.0   GFR: Estimated Creatinine Clearance: 62.7 mL/min (by C-G formula based on SCr of 0.77 mg/dL). Liver Function Tests: Recent Labs  Lab 01/25/19 1623  AST 19  ALT 17  ALKPHOS 86  BILITOT 0.9  PROT 7.0  ALBUMIN 3.8   Recent Labs  Lab 01/25/19 1623  LIPASE 26   No results for input(s): AMMONIA in the last 168 hours. Coagulation Profile: No results for input(s): INR, PROTIME in the last 168 hours. Cardiac Enzymes: No results for input(s): CKTOTAL, CKMB, CKMBINDEX, TROPONINI in the last 168 hours. BNP (last 3 results) No results for input(s): PROBNP in the last 8760 hours. HbA1C: No results for input(s): HGBA1C in the last 72 hours. CBG: No results for input(s): GLUCAP in the last 168 hours. Lipid Profile: No results for input(s): CHOL, HDL, LDLCALC, TRIG, CHOLHDL, LDLDIRECT in the last 72 hours. Thyroid Function Tests: No results for input(s): TSH, T4TOTAL, FREET4, T3FREE, THYROIDAB in the last 72 hours. Anemia Panel: No results for input(s): VITAMINB12, FOLATE, FERRITIN, TIBC,  IRON, RETICCTPCT in the last 72 hours. Urine analysis:    Component Value Date/Time   COLORURINE AMBER (A) 01/25/2019 1823   APPEARANCEUR CLOUDY (A) 01/25/2019 1823   LABSPEC 1.025 01/25/2019 1823   PHURINE 6.0 01/25/2019 1823   GLUCOSEU NEGATIVE 01/25/2019 1823   HGBUR SMALL (A) 01/25/2019 1823   BILIRUBINUR NEGATIVE 01/25/2019 1823   KETONESUR 40 (A) 01/25/2019 1823   PROTEINUR 30 (A) 01/25/2019 1823   NITRITE POSITIVE (A) 01/25/2019 1823   LEUKOCYTESUR TRACE (A) 01/25/2019 1823   Sepsis Labs: @LABRCNTIP (procalcitonin:4,lacticidven:4) )No results found for this or any previous visit (from the past 240 hour(s)).   Radiological Exams on Admission: Ct Abdomen Pelvis W Contrast  Result Date: 01/25/2019 CLINICAL DATA:  Diffuse abdominal pain. History of GERD, nausea. EXAM: CT ABDOMEN AND PELVIS WITH CONTRAST TECHNIQUE: Multidetector CT imaging of the abdomen and pelvis was performed using the standard protocol following bolus administration of intravenous contrast. CONTRAST:  136mL OMNIPAQUE IOHEXOL 300 MG/ML  SOLN COMPARISON:  None. FINDINGS: Lower chest: Mild bibasilar scarring/atelectasis. Hepatobiliary: Numerous stones  within the a otherwise unremarkable gallbladder. No focal liver abnormality. No significant bile duct dilatation seen. Pancreas: Unremarkable. No pancreatic ductal dilatation or surrounding inflammatory changes. Spleen: Normal in size without focal abnormality. Adrenals/Urinary Tract: Adrenal glands appear normal. Kidneys are unremarkable without mass, stone or hydronephrosis. No perinephric fluid. Air within the bladder. Stomach/Bowel: Extensive diverticulosis throughout the colon. Inflammation/fluid stranding is seen about the sigmoid colon indicating acute diverticulitis. The inflamed segment of bowel is contiguous with the anterior-superior margin of the bladder concerning for fistula. No dilated large or small bowel loops. Stomach is unremarkable, decompressed. Appendix is  normal. Vascular/Lymphatic: Aortic atherosclerosis. No acute appearing vascular abnormality. Numerous small lymph nodes within the lower mesentery, likely reactive in nature. Reproductive: Presumed hysterectomy. No adnexal mass. Other: Small amount of free intraperitoneal air within the central abdomen and upper abdomen. No abscess collection seen. Musculoskeletal: No acute or suspicious osseous finding. IMPRESSION: 1. Diverticulitis of the sigmoid colon. The inflamed segment of sigmoid colon is contiguous with the anterior-superior margin of the bladder indicating colovesical fistula, with associated air in the bladder. No circumscribed abscess collection identified. 2. Free intraperitoneal air within the abdomen and pelvis indicating associated bowel perforation, likely micro perforation given the small amount of free air. 3. Cholelithiasis without evidence of acute cholecystitis. These results were called by telephone at the time of interpretation on 01/25/2019 at 6:25 pm to Dr. Jola Schmidt , who verbally acknowledged these results. Aortic Atherosclerosis (ICD10-I70.0). Electronically Signed   By: Franki Cabot M.D.   On: 01/25/2019 18:27    EKG: Not performed.   Assessment/Plan   1. Diverticulitis with microperforation and colovesical fistula  - Presents with 2-3 days of progressive abdominal pain with some nausea, but no vomiting, diarrhea, melena, or hematochezia  - She is found to be afebrile and hemodynamically stable with leukocytosis to 17k and CT-findings concerning for sigmoid diverticulitis with microperforation and colovesical fistula  - Pain is well-controlled after morphine in ED and she was also started on Zosyn  - Continue bowel rest, culture urine, continue Zosyn, pain-control, serial exams    2. Asthma  - No cough or wheezing on admission  - Continue as-needed albuterol MDI    3. Sjogren syndrome  - Stable, on Plaquenil     DVT prophylaxis: SCD's  Code Status: Full  Family  Communication: Discussed with patient  Consults called: None  Admission status: Inpatient; patient has complicated case with diverticulitis, free intraperitoneal air, and colovesical fistula, and she will require close inpatient monitoring and treatment with specialist consultation to avoid life-threatening complications.      Vianne Bulls, MD [facility] Pager [phone]  If 7PM-7AM, please contact night-coverage [email or web address] Password [facility]  01/26/2019, 12:03 AM

## 2019-01-26 NOTE — Consult Note (Signed)
Reason for Consult: Colovesical Fistula, Bacteruria  Referring Physician: Horace Porteous MD  Denise Payne is an 73 y.o. female.   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 Cr 0.8. No large fluid collections / abscesses. Placed on IV Zosyn. Admits to pneumaturia that comes and goes x months.   2 - Bacteruria - bacteruria on UA as expected in setting of CV fistula.   PMH c/w Sjogren's / plaquenill, obesity, benign hyst (pfannenstiel). She is retired Therapist, sports. Her PCP is  Dr. Clayton Bibles with [facility].   Today "Denise Payne" is seen in consultation for above.   Past Medical History:  Diagnosis Date  . Asthma   . Sjogren's syndrome ([place]) 01/26/2019    Past Surgical History:  Procedure Laterality Date  . AUGMENTATION MAMMAPLASTY Bilateral 10/16/1976    Family History  Problem Relation Age of Onset  . Breast cancer Mother 56    Social History:  reports that she has never smoked. She has never used smokeless tobacco. She reports current alcohol use of about 1.0 standard drinks of alcohol per week. She reports that she does not use drugs.  Allergies:  Allergies  Allergen Reactions  . Latex     Medications: I have reviewed the patient's current medications.  Results for orders placed or performed during the hospital encounter of 01/25/19 (from the past 48 hour(s))  CBC with Differential/Platelet     Status: Abnormal   Collection Time: 01/25/19  4:23 PM  Result Value Ref Range   WBC 17.1 (H) 4.0 - 10.5 K/uL   RBC 4.11 3.87 - 5.11 MIL/uL   Hemoglobin 11.7 (L) 12.0 - 15.0 g/dL   HCT 37.1 36.0 - 46.0 %   MCV 90.3 80.0 - 100.0 fL   MCH 28.5 26.0 - 34.0 pg   MCHC 31.5 30.0 - 36.0 g/dL   RDW 12.9 11.5 - 15.5 %   Platelets 272 150 - 400 K/uL   nRBC 0.0 0.0 - 0.2 %   Neutrophils Relative % 89 %   Neutro Abs 15.2 (H) 1.7 - 7.7 K/uL   Lymphocytes Relative 6 %    Lymphs Abs 1.1 0.7 - 4.0 K/uL   Monocytes Relative 4 %   Monocytes Absolute 0.7 0.1 - 1.0 K/uL   Eosinophils Relative 0 %   Eosinophils Absolute 0.0 0.0 - 0.5 K/uL   Basophils Relative 0 %   Basophils Absolute 0.0 0.0 - 0.1 K/uL   Immature Granulocytes 1 %   Abs Immature Granulocytes 0.11 (H) 0.00 - 0.07 K/uL    Comment: Performed at [facility], [address] [place], [place] [address]  Comprehensive metabolic panel     Status: Abnormal   Collection Time: 01/25/19  4:23 PM  Result Value Ref Range   Sodium 137 135 - 145 mmol/L    Comment: CORRECTED RESULTS CALLED TO: REED C RN [id number] [phone] PHILLIPS C CORRECTED ON 04/11 AT 1746: PREVIOUSLY REPORTED AS 130    Potassium 3.4 (L) 3.5 - 5.1 mmol/L   Chloride 102 98 - 111 mmol/L   CO2 23 22 - 32 mmol/L   Glucose, Bld 96 70 - 99 mg/dL   BUN 18 8 - 23 mg/dL   Creatinine, Ser 0.77 0.44 - 1.00 mg/dL   Calcium 9.0 8.9 - 10.3 mg/dL   Total Protein 7.0 6.5 - 8.1 g/dL   Albumin  3.8 3.5 - 5.0 g/dL   AST 19 15 - 41 U/L   ALT 17 0 - 44 U/L   Alkaline Phosphatase 86 38 - 126 U/L   Total Bilirubin 0.9 0.3 - 1.2 mg/dL   GFR calc non Af Amer >60 >60 mL/min   GFR calc Af Amer >60 >60 mL/min   Anion gap 5 5 - 15    Comment: Performed at [facility], [address] [place], [place] [address]  Lipase, blood     Status: None   Collection Time: 01/25/19  4:23 PM  Result Value Ref Range   Lipase 26 11 - 51 U/L    Comment: Performed at [facility], [address] [place], [place] [address]  Urinalysis, Routine w reflex microscopic     Status: Abnormal   Collection Time: 01/25/19  6:23 PM  Result Value Ref Range   Color, Urine AMBER (A) YELLOW    Comment: BIOCHEMICALS MAY BE AFFECTED BY COLOR   APPearance CLOUDY (A) CLEAR   Specific Gravity, Urine 1.025 1.005 - 1.030   pH 6.0 5.0 - 8.0   Glucose, UA NEGATIVE NEGATIVE mg/dL   Hgb urine dipstick SMALL (A) NEGATIVE   Bilirubin Urine NEGATIVE NEGATIVE   Ketones,  ur 40 (A) NEGATIVE mg/dL   Protein, ur 30 (A) NEGATIVE mg/dL   Nitrite POSITIVE (A) NEGATIVE   Leukocytes,Ua TRACE (A) NEGATIVE    Comment: Performed at [facility], [address] [place], [place] [address]  Urinalysis, Microscopic (reflex)     Status: Abnormal   Collection Time: 01/25/19  6:23 PM  Result Value Ref Range   RBC / HPF 0-5 0 - 5 RBC/hpf   WBC, UA 11-20 0 - 5 WBC/hpf   Bacteria, UA MANY (A) NONE SEEN   Squamous Epithelial / LPF 0-5 0 - 5    Comment: Performed at [facility], [address] [place], [place] [address]    Ct Abdomen Pelvis W Contrast  Result Date: 01/25/2019 CLINICAL DATA:  Diffuse abdominal pain. History of GERD, nausea. EXAM: CT ABDOMEN AND PELVIS WITH CONTRAST TECHNIQUE: Multidetector CT imaging of the abdomen and pelvis was performed using the standard protocol following bolus administration of intravenous contrast. CONTRAST:  110mL OMNIPAQUE IOHEXOL 300 MG/ML  SOLN COMPARISON:  None. FINDINGS: Lower chest: Mild bibasilar scarring/atelectasis. Hepatobiliary: Numerous stones within the a otherwise unremarkable gallbladder. No focal liver abnormality. No significant bile duct dilatation seen. Pancreas: Unremarkable. No pancreatic ductal dilatation or surrounding inflammatory changes. Spleen: Normal in size without focal abnormality. Adrenals/Urinary Tract: Adrenal glands appear normal. Kidneys are unremarkable without mass, stone or hydronephrosis. No perinephric fluid. Air within the bladder. Stomach/Bowel: Extensive diverticulosis throughout the colon. Inflammation/fluid stranding is seen about the sigmoid colon indicating acute diverticulitis. The inflamed segment of bowel is contiguous with the anterior-superior margin of the bladder concerning for fistula. No dilated large or small bowel loops. Stomach is unremarkable, decompressed. Appendix is normal. Vascular/Lymphatic: Aortic atherosclerosis. No acute appearing vascular abnormality.  Numerous small lymph nodes within the lower mesentery, likely reactive in nature. Reproductive: Presumed hysterectomy. No adnexal mass. Other: Small amount of free intraperitoneal air within the central abdomen and upper abdomen. No abscess collection seen. Musculoskeletal: No acute or suspicious osseous finding. IMPRESSION: 1. Diverticulitis of the sigmoid colon. The inflamed segment of sigmoid colon is contiguous with the anterior-superior margin of the bladder indicating colovesical fistula, with associated air in the bladder. No circumscribed  abscess collection identified. 2. Free intraperitoneal air within the abdomen and pelvis indicating associated bowel perforation, likely micro perforation given the small amount of free air. 3. Cholelithiasis without evidence of acute cholecystitis. These results were called by telephone at the time of interpretation on 01/25/2019 at 6:25 pm to Dr. Jola Schmidt , who verbally acknowledged these results. Aortic Atherosclerosis (ICD10-I70.0). Electronically Signed   By: Franki Cabot M.D.   On: 01/25/2019 18:27    Review of Systems  Constitutional: Positive for malaise/fatigue.  HENT: Negative.   Eyes: Negative.   Respiratory: Negative.   Cardiovascular: Negative.   Gastrointestinal: Positive for abdominal pain and nausea.  Genitourinary: Negative.   Skin: Negative.   Neurological: Negative.   Endo/Heme/Allergies: Negative.   Psychiatric/Behavioral: Negative.    Blood pressure (!) 123/54, pulse 98, temperature 98.3 F (36.8 C), temperature source Oral, resp. rate 18, height 5' 2.5" (1.588 m), weight 81.6 kg, SpO2 98 %. Physical Exam  Constitutional: She appears well-developed.  Some mild malaise. Very pleasant.   HENT:  Head: Normocephalic.  Eyes: Pupils are equal, round, and reactive to light.  Cardiovascular: Normal rate.  Respiratory: Effort normal.  GI:  Obese with diffuse tenderness w/o rebound. 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   Genitourinary:     Genitourinary Comments: No CVAT   Musculoskeletal: Normal range of motion.  Neurological: She is alert.  Skin: Skin is warm.  Psychiatric: She has a normal mood and affect.    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 No urgent operative indications from GU perspective. If pt needs surgery this admission from gen surg perspective, then I am happy to repair bladder concomitantly. Otherwise, trial of medical management this admission for diverticulitis and then outpatient eval and consideration of combined segmental colectomy / CV fistula repair in outpatient setting likely optimal.   2 - Bacteruria - expected in setting of CV fistula. Would only treat if fevers or systemic symptoms to avoid favoring resistant organisms at this point.  Please call me directly with questions anytime.   Alexis Frock 01/26/2019, 6:54 AM

## 2019-01-26 NOTE — Consult Note (Signed)
[facility] Surgery Consult Note  Denise Payne 02-Jul-1946  [id number].    Requesting MD: Tawanna Solo Chief Complaint/Reason for Consult: diverticulitis with microperforation and colovesical fistula  HPI:  Patient is a 73 year old female who presented to [facility] with abdominal pain. Pain is generalized and progressively worsened prior to presentation. Pain characterized as a pressure with occasional sharper cramping. Associated nausea and chills. Denied fevers, vomiting, diarrhea, bloody stools. Does report some passing of air with urination over the last year intermittently. Denies passage of urine that looks like stool. Last colonoscopy about 2 years ago in [place], [place] - she doesn't remember being told that anything was unusual about this. Has never had known diverticulitis in the past.  Allergic to latex. Denies tobacco or illicit drug use. Consumes a glass of wine several times per week, but not daily. No blood thinning medications. Past abdominal surgery includes abdominal hysterectomy. Patient is a retired Therapist, sports.   ROS: Review of Systems  Constitutional: Negative for chills and fever.  Respiratory: Negative for shortness of breath.   Cardiovascular: Negative for chest pain and palpitations.  Gastrointestinal: Positive for abdominal pain and nausea. Negative for blood in stool, constipation, diarrhea, melena and vomiting.  Genitourinary: Negative for dysuria, frequency and urgency.       Pneumaturia  All other systems reviewed and are negative.   Family History  Problem Relation Age of Onset  . Breast cancer Mother 107    Past Medical History:  Diagnosis Date  . Asthma   . Sjogren's syndrome ([place]) 01/26/2019    Past Surgical History:  Procedure Laterality Date  . AUGMENTATION MAMMAPLASTY Bilateral 10/16/1976    Social History:  reports that she has never smoked. She has never used smokeless tobacco. She reports current alcohol use of about 1.0 standard drinks of alcohol per  week. She reports that she does not use drugs.  Allergies:  Allergies  Allergen Reactions  . Latex     Medications Prior to Admission  Medication Sig Dispense Refill  . aspirin EC 81 MG tablet Take by mouth.    . diclofenac sodium (VOLTAREN) 1 % GEL Apply topically.    Marland Kitchen esomeprazole (NEXIUM) 40 MG capsule Take by mouth.    . hydroxychloroquine (PLAQUENIL) 200 MG tablet Take by mouth.    . Melatonin 10 MG TABS Take by mouth.    . montelukast (SINGULAIR) 10 MG tablet Take by mouth.    . vitamin B-12 (CYANOCOBALAMIN) 1000 MCG tablet Take by mouth.    . Vitamin D, Ergocalciferol, (DRISDOL) 1.25 MG (50000 UT) CAPS capsule Take by mouth.    Marland Kitchen albuterol (PROVENTIL HFA;VENTOLIN HFA) 108 (90 Base) MCG/ACT inhaler Inhale into the lungs.    . cyclobenzaprine (FLEXERIL) 10 MG tablet Take by mouth.    Marland Kitchen HYDROcodone-acetaminophen (NORCO/VICODIN) 5-325 MG tablet Take 1-2 tablets by mouth every 8 (eight) hours as needed. 10 tablet 0    Blood pressure (!) 148/59, pulse 98, temperature 98.2 F (36.8 C), temperature source Oral, resp. rate 18, height 5' 2.5" (1.588 m), weight 81.6 kg, SpO2 98 %. Physical Exam: Physical Exam Constitutional:      General: She is not in acute distress.    Appearance: She is well-developed and overweight. She is not toxic-appearing.  HENT:     Head: Normocephalic and atraumatic.     Right Ear: External ear normal.     Left Ear: External ear normal.     Nose: Nose normal.     Mouth/Throat:  Lips: Pink.     Mouth: Mucous membranes are dry.  Eyes:     General: Lids are normal. No scleral icterus.    Extraocular Movements: Extraocular movements intact.     Conjunctiva/sclera: Conjunctivae normal.  Neck:     Musculoskeletal: Normal range of motion and neck supple.  Cardiovascular:     Rate and Rhythm: Normal rate and regular rhythm.     Pulses:          Radial pulses are 2+ on the right side and 2+ on the left side.  Pulmonary:     Effort: Pulmonary effort  is normal.     Breath sounds: Normal breath sounds.  Abdominal:     General: Bowel sounds are normal. There is no distension.     Palpations: Abdomen is soft. There is no hepatomegaly or splenomegaly.     Tenderness: There is generalized abdominal tenderness. There is no guarding or rebound.  Musculoskeletal:     Comments: ROM grossly intact in bilateral upper and lower extremities  Skin:    General: Skin is warm and dry.  Neurological:     Mental Status: She is alert and oriented to person, place, and time.  Psychiatric:        Attention and Perception: Attention and perception normal.        Mood and Affect: Mood and affect normal.        Speech: Speech normal.        Behavior: Behavior is cooperative.     Results for orders placed or performed during the hospital encounter of 01/25/19 (from the past 48 hour(s))  CBC with Differential/Platelet     Status: Abnormal   Collection Time: 01/25/19  4:23 PM  Result Value Ref Range   WBC 17.1 (H) 4.0 - 10.5 K/uL   RBC 4.11 3.87 - 5.11 MIL/uL   Hemoglobin 11.7 (L) 12.0 - 15.0 g/dL   HCT 37.1 36.0 - 46.0 %   MCV 90.3 80.0 - 100.0 fL   MCH 28.5 26.0 - 34.0 pg   MCHC 31.5 30.0 - 36.0 g/dL   RDW 12.9 11.5 - 15.5 %   Platelets 272 150 - 400 K/uL   nRBC 0.0 0.0 - 0.2 %   Neutrophils Relative % 89 %   Neutro Abs 15.2 (H) 1.7 - 7.7 K/uL   Lymphocytes Relative 6 %   Lymphs Abs 1.1 0.7 - 4.0 K/uL   Monocytes Relative 4 %   Monocytes Absolute 0.7 0.1 - 1.0 K/uL   Eosinophils Relative 0 %   Eosinophils Absolute 0.0 0.0 - 0.5 K/uL   Basophils Relative 0 %   Basophils Absolute 0.0 0.0 - 0.1 K/uL   Immature Granulocytes 1 %   Abs Immature Granulocytes 0.11 (H) 0.00 - 0.07 K/uL    Comment: Performed at [facility], [address] [place], [place] [address]  Comprehensive metabolic panel     Status: Abnormal   Collection Time: 01/25/19  4:23 PM  Result Value Ref Range   Sodium 137 135 - 145 mmol/L    Comment: CORRECTED RESULTS  CALLED TO: REED C RN 1745 [id number] PHILLIPS C CORRECTED ON 04/11 AT 1746: PREVIOUSLY REPORTED AS 130    Potassium 3.4 (L) 3.5 - 5.1 mmol/L   Chloride 102 98 - 111 mmol/L   CO2 23 22 - 32 mmol/L   Glucose, Bld 96 70 - 99 mg/dL   BUN 18 8 - 23 mg/dL   Creatinine, Ser 0.77 0.44 -  1.00 mg/dL   Calcium 9.0 8.9 - 10.3 mg/dL   Total Protein 7.0 6.5 - 8.1 g/dL   Albumin 3.8 3.5 - 5.0 g/dL   AST 19 15 - 41 U/L   ALT 17 0 - 44 U/L   Alkaline Phosphatase 86 38 - 126 U/L   Total Bilirubin 0.9 0.3 - 1.2 mg/dL   GFR calc non Af Amer >60 >60 mL/min   GFR calc Af Amer >60 >60 mL/min   Anion gap 5 5 - 15    Comment: Performed at [facility], [address] [place], [place] [address]  Lipase, blood     Status: None   Collection Time: 01/25/19  4:23 PM  Result Value Ref Range   Lipase 26 11 - 51 U/L    Comment: Performed at [facility], [address] [place], [place] [address]  Urinalysis, Routine w reflex microscopic     Status: Abnormal   Collection Time: 01/25/19  6:23 PM  Result Value Ref Range   Color, Urine AMBER (A) YELLOW    Comment: BIOCHEMICALS MAY BE AFFECTED BY COLOR   APPearance CLOUDY (A) CLEAR   Specific Gravity, Urine 1.025 1.005 - 1.030   pH 6.0 5.0 - 8.0   Glucose, UA NEGATIVE NEGATIVE mg/dL   Hgb urine dipstick SMALL (A) NEGATIVE   Bilirubin Urine NEGATIVE NEGATIVE   Ketones, ur 40 (A) NEGATIVE mg/dL   Protein, ur 30 (A) NEGATIVE mg/dL   Nitrite POSITIVE (A) NEGATIVE   Leukocytes,Ua TRACE (A) NEGATIVE    Comment: Performed at [facility], [address] [place], [place] [address]  Urinalysis, Microscopic (reflex)     Status: Abnormal   Collection Time: 01/25/19  6:23 PM  Result Value Ref Range   RBC / HPF 0-5 0 - 5 RBC/hpf   WBC, UA 11-20 0 - 5 WBC/hpf   Bacteria, UA MANY (A) NONE SEEN   Squamous Epithelial / LPF 0-5 0 - 5    Comment: Performed at [facility], [address] [place], [place] [address]  CBC      Status: Abnormal   Collection Time: 01/26/19  9:14 AM  Result Value Ref Range   WBC 17.8 (H) 4.0 - 10.5 K/uL   RBC 3.92 3.87 - 5.11 MIL/uL   Hemoglobin 10.9 (L) 12.0 - 15.0 g/dL   HCT 34.6 (L) 36.0 - 46.0 %   MCV 88.3 80.0 - 100.0 fL   MCH 27.8 26.0 - 34.0 pg   MCHC 31.5 30.0 - 36.0 g/dL   RDW 13.0 11.5 - 15.5 %   Platelets 247 150 - 400 K/uL   nRBC 0.0 0.0 - 0.2 %    Comment: Performed at [facility], [place] [address], [place], [place] [address]  Comprehensive metabolic panel     Status: Abnormal   Collection Time: 01/26/19  9:14 AM  Result Value Ref Range   Sodium 137 135 - 145 mmol/L   Potassium 3.9 3.5 - 5.1 mmol/L   Chloride 106 98 - 111 mmol/L   CO2 21 (L) 22 - 32 mmol/L   Glucose, Bld 88 70 - 99 mg/dL   BUN 14 8 - 23 mg/dL   Creatinine, Ser 0.82 0.44 - 1.00 mg/dL   Calcium 8.6 (L) 8.9 - 10.3 mg/dL   Total Protein 6.1 (L) 6.5 - 8.1 g/dL   Albumin 2.9 (L) 3.5 - 5.0 g/dL   AST 14 (L)  15 - 41 U/L   ALT 15 0 - 44 U/L   Alkaline Phosphatase 84 38 - 126 U/L   Total Bilirubin 1.0 0.3 - 1.2 mg/dL   GFR calc non Af Amer >60 >60 mL/min   GFR calc Af Amer >60 >60 mL/min   Anion gap 10 5 - 15    Comment: Performed at [facility] [address], [place], [place] [address]   Ct Abdomen Pelvis W Contrast  Result Date: 01/25/2019 CLINICAL DATA:  Diffuse abdominal pain. History of GERD, nausea. EXAM: CT ABDOMEN AND PELVIS WITH CONTRAST TECHNIQUE: Multidetector CT imaging of the abdomen and pelvis was performed using the standard protocol following bolus administration of intravenous contrast. CONTRAST:  111mL OMNIPAQUE IOHEXOL 300 MG/ML  SOLN COMPARISON:  None. FINDINGS: Lower chest: Mild bibasilar scarring/atelectasis. Hepatobiliary: Numerous stones within the a otherwise unremarkable gallbladder. No focal liver abnormality. No significant bile duct dilatation seen. Pancreas: Unremarkable. No pancreatic ductal dilatation or surrounding inflammatory changes. Spleen: Normal in  size without focal abnormality. Adrenals/Urinary Tract: Adrenal glands appear normal. Kidneys are unremarkable without mass, stone or hydronephrosis. No perinephric fluid. Air within the bladder. Stomach/Bowel: Extensive diverticulosis throughout the colon. Inflammation/fluid stranding is seen about the sigmoid colon indicating acute diverticulitis. The inflamed segment of bowel is contiguous with the anterior-superior margin of the bladder concerning for fistula. No dilated large or small bowel loops. Stomach is unremarkable, decompressed. Appendix is normal. Vascular/Lymphatic: Aortic atherosclerosis. No acute appearing vascular abnormality. Numerous small lymph nodes within the lower mesentery, likely reactive in nature. Reproductive: Presumed hysterectomy. No adnexal mass. Other: Small amount of free intraperitoneal air within the central abdomen and upper abdomen. No abscess collection seen. Musculoskeletal: No acute or suspicious osseous finding. IMPRESSION: 1. Diverticulitis of the sigmoid colon. The inflamed segment of sigmoid colon is contiguous with the anterior-superior margin of the bladder indicating colovesical fistula, with associated air in the bladder. No circumscribed abscess collection identified. 2. Free intraperitoneal air within the abdomen and pelvis indicating associated bowel perforation, likely micro perforation given the small amount of free air. 3. Cholelithiasis without evidence of acute cholecystitis. These results were called by telephone at the time of interpretation on 01/25/2019 at 6:25 pm to Dr. Jola Schmidt , who verbally acknowledged these results. Aortic Atherosclerosis (ICD10-I70.0). Electronically Signed   By: Franki Cabot M.D.   On: 01/25/2019 18:27      Assessment/Plan Asthma Sjogren's syndrome   Sigmoid Diverticulitis with microperforation Colovesical fistula - seen on CT 4/11 - WBC 17.8, afebrile - agree with bowel rest and IV abx - would not start on CLD  until pain and WBC improving  - No indications for urgent or emergent surgical intervention, but we will continue to follow   FEN: NPO, IVF VTE: SCDs ID: Zosyn 4/11>>   Brigid Re, [facility] Surgery 01/26/2019, 11:57 AM Pager: [place]: [phone]

## 2019-01-27 LAB — CBC WITH DIFFERENTIAL/PLATELET
Abs Immature Granulocytes: 0.09 10*3/uL — ABNORMAL HIGH (ref 0.00–0.07)
Basophils Absolute: 0 10*3/uL (ref 0.0–0.1)
Basophils Relative: 0 %
Eosinophils Absolute: 0.1 10*3/uL (ref 0.0–0.5)
Eosinophils Relative: 0 %
HCT: 34.7 % — ABNORMAL LOW (ref 36.0–46.0)
Hemoglobin: 10.8 g/dL — ABNORMAL LOW (ref 12.0–15.0)
Immature Granulocytes: 1 %
Lymphocytes Relative: 5 %
Lymphs Abs: 0.7 10*3/uL (ref 0.7–4.0)
MCH: 27.3 pg (ref 26.0–34.0)
MCHC: 31.1 g/dL (ref 30.0–36.0)
MCV: 87.8 fL (ref 80.0–100.0)
Monocytes Absolute: 0.8 10*3/uL (ref 0.1–1.0)
Monocytes Relative: 6 %
Neutro Abs: 11.9 10*3/uL — ABNORMAL HIGH (ref 1.7–7.7)
Neutrophils Relative %: 88 %
Platelets: 265 10*3/uL (ref 150–400)
RBC: 3.95 MIL/uL (ref 3.87–5.11)
RDW: 12.9 % (ref 11.5–15.5)
WBC: 13.6 10*3/uL — ABNORMAL HIGH (ref 4.0–10.5)
nRBC: 0 % (ref 0.0–0.2)

## 2019-01-27 LAB — URINE CULTURE: Culture: <10000 — AB

## 2019-01-27 MED ORDER — ZOLPIDEM TARTRATE 5 MG PO TABS
5.0000 mg | ORAL_TABLET | Freq: Once | ORAL | Status: AC
  Administered 2019-01-27: 23:00:00 5 mg via ORAL
  Filled 2019-01-27: qty 1

## 2019-01-27 NOTE — TOC Initial Note (Signed)
Transition of Care [facility]) - Initial/Assessment Note    Patient Details  Name: Denise Payne MRN: [id number] Date of Birth: Sep 09, 1946  Transition of Care [facility]) CM/SW Contact:    Pollie Friar, RN Phone Number: 01/27/2019, 11:07 AM  Clinical Narrative:                   Expected Discharge Plan: Home/Self Care Barriers to Discharge: Continued Medical Work up   Patient Goals and CMS Choice        Expected Discharge Plan and Services Expected Discharge Plan: Home/Self Care       Living arrangements for the past 2 months: Single Family Home(town home--pt able to stay on ground level)                          Prior Living Arrangements/Services Living arrangements for the past 2 months: Single Family Home(town home--pt able to stay on ground level) Lives with:: Self Patient language and need for interpreter reviewed:: Yes(no needs) Do you feel safe going back to the place where you live?: Yes      Need for Family Participation in Patient Care: No (Comment) Care giver support system in place?: Yes (comment)(Pt states son lives close and daughter is coming to town to stay after hospitalization) Current home services: DME(cane but doesnt use) Criminal Activity/Legal Involvement Pertinent to Current Situation/Hospitalization: No - Comment as needed  Activities of Daily Living Home Assistive Devices/Equipment: Eyeglasses ADL Screening (condition at time of admission) Patient's cognitive ability adequate to safely complete daily activities?: Yes Is the patient deaf or have difficulty hearing?: Yes Does the patient have difficulty seeing, even when wearing glasses/contacts?: No Does the patient have difficulty concentrating, remembering, or making decisions?: Yes Patient able to express need for assistance with ADLs?: Yes Does the patient have difficulty dressing or bathing?: Yes Independently performs ADLs?: Yes (appropriate for developmental age) Does the patient have  difficulty walking or climbing stairs?: Yes Weakness of Legs: None Weakness of Arms/Hands: Right  Permission Sought/Granted                  Emotional Assessment Appearance:: Appears stated age Attitude/Demeanor/Rapport: Engaged Affect (typically observed): Accepting, Appropriate, Pleasant Orientation: : Oriented to Self, Oriented to Place, Oriented to  Time, Oriented to Situation   Psych Involvement: No (comment)  Admission diagnosis:  Diverticulitis [K57.92] Bowel perforation ([place]) [K63.1] Patient Active Problem List   Diagnosis Date Noted  . Asthma 01/26/2019  . Sjogren's syndrome ([place]) 01/26/2019  . Diverticulitis of large intestine with perforation 01/25/2019  . 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  Pt drives. Daughter or son can assist if needed.  Readmission Risk Interventions No flowsheet data found.

## 2019-01-27 NOTE — Progress Notes (Signed)
PROGRESS NOTE    Denise Payne  GMW:[id number] DOB: 1946-03-08 DOA: 01/25/2019 PCP: Leeroy Cha, MD   Brief Narrative: Patient is a 73 year old female with history of asthma, Sjogren's syndrome who presents to the emergency department for the evaluation of abdominal pain.  She reported progressive generalized abdominal pain over the last 2 to 3 days with some nausea but no vomiting or diarrhea.  Reported passage of air during urination intermittently over the past year.  CT abdomen/pelvis was concerning for diverticulitis of the sigmoid colon with the inflamed segment contiguous with the anterior superior margin of the urinary bladder indicating a colovesical fistula with associated air  in the bladder.  CT also noted intraperitoneal free air suggesting microperforation.  Urology consulted,general surgery.  Started on conservative management with antibiotics and IV fluids with pain management.  Assessment & Plan:   Principal Problem:   Diverticulitis of large intestine with perforation Active Problems:   Colovesical fistula   Asthma   Sjogren's syndrome ([place])   Diverticulitis with microperforation/colovesicular fistula: Presented with 2 to 3 days of progressive abdominal pain with nausea.  No diarrhea, melena or hematochezia.  Found to be hemodynamically stable on presentation with leukocytosis.  CT finding as above. Urology consulted .  Started on Zosyn.  Will follow up urine culture.  Pain well controlled. Continue conservative management with bowel rest, pain medications, serial abdominal examination. General surgery consulted and following.  Continue n.p.o. status until further improvement in the pain.  Asthma: Currently stable.  No cough or wheezing.  Continue as needed inhalers  History of Sjogren syndrome: On Plaquenil.  Stable          DVT prophylaxis:SCD Code Status: Full Family Communication: None present at the bed side .talked to family yesterday.Will talk  tomorrow again. Disposition Plan: Home after resolution of abdominal pain, ability to tolerate food   Consultants: General surgery,Urology  Procedures: None  Antimicrobials:  Anti-infectives (From admission, onward)   Start     Dose/Rate Route Frequency Ordered Stop   01/26/19 1000  hydroxychloroquine (PLAQUENIL) tablet 200 mg     200 mg Oral 2 times daily 01/26/19 0041     01/26/19 0000  piperacillin-tazobactam (ZOSYN) IVPB 3.375 g  Status:  Discontinued     3.375 g 100 mL/hr over 30 Minutes Intravenous Every 6 hours 01/25/19 2138 01/25/19 2325   01/26/19 0000  piperacillin-tazobactam (ZOSYN) IVPB 3.375 g     3.375 g 12.5 mL/hr over 240 Minutes Intravenous Every 8 hours 01/25/19 2325     01/25/19 1845  piperacillin-tazobactam (ZOSYN) IVPB 3.375 g     3.375 g 100 mL/hr over 30 Minutes Intravenous  Once 01/25/19 1832 01/25/19 1927      Subjective: Patient seen and examined the bedside this morning.  Looks more comfortable today.  Pain better but is still there.  Passing gas but no bowel movement.Wants to take Ginger Ale.  Objective: Vitals:   01/26/19 1941 01/27/19 0026 01/27/19 0338 01/27/19 0740  BP: (!) 142/67 139/62 (!) 147/75 (!) 155/74  Pulse: 92 94 96 93  Resp:  15  18  Temp: 99.9 F (37.7 C) 99.3 F (37.4 C) 99.5 F (37.5 C) 98.8 F (37.1 C)  TempSrc: Oral Oral Oral Oral  SpO2: 96% 96% 95% 98%  Weight:      Height:        Intake/Output Summary (Last 24 hours) at 01/27/2019 1109 Last data filed at 01/27/2019 0733 Gross per 24 hour  Intake 1442.28 ml  Output 600 ml  Net 842.28 ml   Filed Weights   01/25/19 1537  Weight: 81.6 kg    Examination:  General exam: Not in distress,average built HEENT:PERRL,Oral mucosa moist, Ear/Nose normal on gross exam Respiratory system: Bilateral equal air entry, normal vesicular breath sounds, no wheezes or crackles  Cardiovascular system: S1 & S2 heard, RRR. No JVD, murmurs, rubs, gallops or clicks. Gastrointestinal  system: Abdomen is nondistended, soft .  Generalized tenderness. No organomegaly or masses felt. Normal bowel sounds heard. Central nervous system: Alert and oriented. No focal neurological deficits. Extremities: No edema, no clubbing ,no cyanosis, distal peripheral pulses palpable. Skin: No rashes, lesions or ulcers,no icterus ,no pallor MSK: Normal muscle bulk,tone ,power Psychiatry: Judgement and insight appear normal. Mood & affect appropriate.     Data Reviewed: I have personally reviewed following labs and imaging studies  CBC: Recent Labs  Lab 01/25/19 1623 01/26/19 0914 01/27/19 0526  WBC 17.1* 17.8* 13.6*  NEUTROABS 15.2*  --  11.9*  HGB 11.7* 10.9* 10.8*  HCT 37.1 34.6* 34.7*  MCV 90.3 88.3 87.8  PLT 272 247 710   Basic Metabolic Panel: Recent Labs  Lab 01/25/19 1623 01/26/19 0914  NA 137 137  K 3.4* 3.9  CL 102 106  CO2 23 21*  GLUCOSE 96 88  BUN 18 14  CREATININE 0.77 0.82  CALCIUM 9.0 8.6*   GFR: Estimated Creatinine Clearance: 61.2 mL/min (by C-G formula based on SCr of 0.82 mg/dL). Liver Function Tests: Recent Labs  Lab 01/25/19 1623 01/26/19 0914  AST 19 14*  ALT 17 15  ALKPHOS 86 84  BILITOT 0.9 1.0  PROT 7.0 6.1*  ALBUMIN 3.8 2.9*   Recent Labs  Lab 01/25/19 1623  LIPASE 26   No results for input(s): AMMONIA in the last 168 hours. Coagulation Profile: No results for input(s): INR, PROTIME in the last 168 hours. Cardiac Enzymes: No results for input(s): CKTOTAL, CKMB, CKMBINDEX, TROPONINI in the last 168 hours. BNP (last 3 results) No results for input(s): PROBNP in the last 8760 hours. HbA1C: No results for input(s): HGBA1C in the last 72 hours. CBG: No results for input(s): GLUCAP in the last 168 hours. Lipid Profile: No results for input(s): CHOL, HDL, LDLCALC, TRIG, CHOLHDL, LDLDIRECT in the last 72 hours. Thyroid Function Tests: No results for input(s): TSH, T4TOTAL, FREET4, T3FREE, THYROIDAB in the last 72 hours. Anemia  Panel: No results for input(s): VITAMINB12, FOLATE, FERRITIN, TIBC, IRON, RETICCTPCT in the last 72 hours. Sepsis Labs: No results for input(s): PROCALCITON, LATICACIDVEN in the last 168 hours.  Recent Results (from the past 240 hour(s))  Urine Culture     Status: Abnormal   Collection Time: 01/26/19  4:04 AM  Result Value Ref Range Status   Specimen Description URINE, CLEAN CATCH  Final   Special Requests NONE  Final   Culture (A)  Final    <10,000 COLONIES/mL INSIGNIFICANT GROWTH Performed at [facility], [address], [place], [place] [address]    Report Status 01/27/2019 FINAL  Final         Radiology Studies: Ct Abdomen Pelvis W Contrast  Result Date: 01/25/2019 CLINICAL DATA:  Diffuse abdominal pain. History of GERD, nausea. EXAM: CT ABDOMEN AND PELVIS WITH CONTRAST TECHNIQUE: Multidetector CT imaging of the abdomen and pelvis was performed using the standard protocol following bolus administration of intravenous contrast. CONTRAST:  161mL OMNIPAQUE IOHEXOL 300 MG/ML  SOLN COMPARISON:  None. FINDINGS: Lower chest: Mild bibasilar scarring/atelectasis. Hepatobiliary: Numerous stones within the a otherwise  unremarkable gallbladder. No focal liver abnormality. No significant bile duct dilatation seen. Pancreas: Unremarkable. No pancreatic ductal dilatation or surrounding inflammatory changes. Spleen: Normal in size without focal abnormality. Adrenals/Urinary Tract: Adrenal glands appear normal. Kidneys are unremarkable without mass, stone or hydronephrosis. No perinephric fluid. Air within the bladder. Stomach/Bowel: Extensive diverticulosis throughout the colon. Inflammation/fluid stranding is seen about the sigmoid colon indicating acute diverticulitis. The inflamed segment of bowel is contiguous with the anterior-superior margin of the bladder concerning for fistula. No dilated large or small bowel loops. Stomach is unremarkable, decompressed. Appendix is normal.  Vascular/Lymphatic: Aortic atherosclerosis. No acute appearing vascular abnormality. Numerous small lymph nodes within the lower mesentery, likely reactive in nature. Reproductive: Presumed hysterectomy. No adnexal mass. Other: Small amount of free intraperitoneal air within the central abdomen and upper abdomen. No abscess collection seen. Musculoskeletal: No acute or suspicious osseous finding. IMPRESSION: 1. Diverticulitis of the sigmoid colon. The inflamed segment of sigmoid colon is contiguous with the anterior-superior margin of the bladder indicating colovesical fistula, with associated air in the bladder. No circumscribed abscess collection identified. 2. Free intraperitoneal air within the abdomen and pelvis indicating associated bowel perforation, likely micro perforation given the small amount of free air. 3. Cholelithiasis without evidence of acute cholecystitis. These results were called by telephone at the time of interpretation on 01/25/2019 at 6:25 pm to Dr. Jola Schmidt , who verbally acknowledged these results. Aortic Atherosclerosis (ICD10-I70.0). Electronically Signed   By: Franki Cabot M.D.   On: 01/25/2019 18:27        Scheduled Meds:  hydroxychloroquine  200 mg Oral BID   ondansetron (ZOFRAN) IV  4 mg Intravenous Once   pantoprazole (PROTONIX) IV  40 mg Intravenous QHS   Continuous Infusions:  sodium chloride Stopped (01/26/19 0203)   dextrose 5 % and 0.9% NaCl 75 mL/hr at 01/27/19 0925   piperacillin-tazobactam (ZOSYN)  IV 3.375 g (01/27/19 0744)     LOS: 2 days    Time spent: 35 mins.More than 50% of that time was spent in counseling and/or coordination of care.      Shelly Coss, MD [facility] Pager [phone]  If 7PM-7AM, please contact night-coverage [email or web address] Password [facility] 01/27/2019, 11:09 AM

## 2019-01-27 NOTE — Progress Notes (Signed)
Subjective/Chief Complaint: Pt with some abdominal pain.  Not worsening    Objective: Vital signs in last 24 hours: Temp:  [98.5 F (36.9 C)-99.9 F (37.7 C)] 98.8 F (37.1 C) (04/13 0740) Pulse Rate:  [77-96] 93 (04/13 0740) Resp:  [15-18] 18 (04/13 0740) BP: (119-155)/(52-75) 155/74 (04/13 0740) SpO2:  [95 %-98 %] 98 % (04/13 0740) Last BM Date: 01/25/19  Intake/Output from previous day: 04/12 0701 - 04/13 0700 In: 1374.3 [I.V.:1349.3; IV Piggyback:25] Out: 600 [Urine:600] Intake/Output this shift: Total I/O In: 357.5 [I.V.:341.2; IV Piggyback:16.3] Out: -   Constitutional: No acute distress, conversant, appears states age. Eyes: Anicteric sclerae, moist conjunctiva, no lid lag Lungs: Clear to auscultation bilaterally, normal respiratory effort CV: regular rate and rhythm, no murmurs, no peripheral edema, pedal pulses 2+ GI: Soft, no masses or hepatosplenomegaly, tender to palpation BLQ Skin: No rashes, palpation reveals normal turgor Psychiatric: appropriate judgment and insight, oriented to person, place, and time   Lab Results:  Recent Labs    01/26/19 0914 01/27/19 0526  WBC 17.8* 13.6*  HGB 10.9* 10.8*  HCT 34.6* 34.7*  PLT 247 265   BMET Recent Labs    01/25/19 1623 01/26/19 0914  NA 137 137  K 3.4* 3.9  CL 102 106  CO2 23 21*  GLUCOSE 96 88  BUN 18 14  CREATININE 0.77 0.82  CALCIUM 9.0 8.6*   PT/INR No results for input(s): LABPROT, INR in the last 72 hours. ABG No results for input(s): PHART, HCO3 in the last 72 hours.  Invalid input(s): PCO2, PO2  Studies/Results: Ct Abdomen Pelvis W Contrast  Result Date: 01/25/2019 CLINICAL DATA:  Diffuse abdominal pain. History of GERD, nausea. EXAM: CT ABDOMEN AND PELVIS WITH CONTRAST TECHNIQUE: Multidetector CT imaging of the abdomen and pelvis was performed using the standard protocol following bolus administration of intravenous contrast. CONTRAST:  190mL OMNIPAQUE IOHEXOL 300 MG/ML  SOLN  COMPARISON:  None. FINDINGS: Lower chest: Mild bibasilar scarring/atelectasis. Hepatobiliary: Numerous stones within the a otherwise unremarkable gallbladder. No focal liver abnormality. No significant bile duct dilatation seen. Pancreas: Unremarkable. No pancreatic ductal dilatation or surrounding inflammatory changes. Spleen: Normal in size without focal abnormality. Adrenals/Urinary Tract: Adrenal glands appear normal. Kidneys are unremarkable without mass, stone or hydronephrosis. No perinephric fluid. Air within the bladder. Stomach/Bowel: Extensive diverticulosis throughout the colon. Inflammation/fluid stranding is seen about the sigmoid colon indicating acute diverticulitis. The inflamed segment of bowel is contiguous with the anterior-superior margin of the bladder concerning for fistula. No dilated large or small bowel loops. Stomach is unremarkable, decompressed. Appendix is normal. Vascular/Lymphatic: Aortic atherosclerosis. No acute appearing vascular abnormality. Numerous small lymph nodes within the lower mesentery, likely reactive in nature. Reproductive: Presumed hysterectomy. No adnexal mass. Other: Small amount of free intraperitoneal air within the central abdomen and upper abdomen. No abscess collection seen. Musculoskeletal: No acute or suspicious osseous finding. IMPRESSION: 1. Diverticulitis of the sigmoid colon. The inflamed segment of sigmoid colon is contiguous with the anterior-superior margin of the bladder indicating colovesical fistula, with associated air in the bladder. No circumscribed abscess collection identified. 2. Free intraperitoneal air within the abdomen and pelvis indicating associated bowel perforation, likely micro perforation given the small amount of free air. 3. Cholelithiasis without evidence of acute cholecystitis. These results were called by telephone at the time of interpretation on 01/25/2019 at 6:25 pm to Dr. Jola Schmidt , who verbally acknowledged these  results. Aortic Atherosclerosis (ICD10-I70.0). Electronically Signed   By: Roxy Horseman.D.  On: 01/25/2019 18:27    Anti-infectives: Anti-infectives (From admission, onward)   Start     Dose/Rate Route Frequency Ordered Stop   01/26/19 1000  hydroxychloroquine (PLAQUENIL) tablet 200 mg     200 mg Oral 2 times daily 01/26/19 0041     01/26/19 0000  piperacillin-tazobactam (ZOSYN) IVPB 3.375 g  Status:  Discontinued     3.375 g 100 mL/hr over 30 Minutes Intravenous Every 6 hours 01/25/19 2138 01/25/19 2325   01/26/19 0000  piperacillin-tazobactam (ZOSYN) IVPB 3.375 g     3.375 g 12.5 mL/hr over 240 Minutes Intravenous Every 8 hours 01/25/19 2325     01/25/19 1845  piperacillin-tazobactam (ZOSYN) IVPB 3.375 g     3.375 g 100 mL/hr over 30 Minutes Intravenous  Once 01/25/19 1832 01/25/19 1927      Assessment/Plan:  20F Sigmoid Diverticulitis with microperforation Colovesical fistula - seen on CT 4/11 - WBC trending down, afebrile - con't with bowel rest and IV abx - would not start on CLD until pain improving  - No indications for urgent or emergent surgical intervention, but we will continue to follow  -s/w Pt's daughter Valetta Fuller and updated her on pt's status.  All questions were answered.  Asthma Sjogren's syndrome    FEN: NPO, IVF VTE: SCDs ID: Zosyn 4/11>>   LOS: 2 days    Ralene Ok 01/27/2019

## 2019-01-28 LAB — CBC
HCT: 32 % — ABNORMAL LOW (ref 36.0–46.0)
Hemoglobin: 10.2 g/dL — ABNORMAL LOW (ref 12.0–15.0)
MCH: 27.9 pg (ref 26.0–34.0)
MCHC: 31.9 g/dL (ref 30.0–36.0)
MCV: 87.7 fL (ref 80.0–100.0)
Platelets: 271 10*3/uL (ref 150–400)
RBC: 3.65 MIL/uL — ABNORMAL LOW (ref 3.87–5.11)
RDW: 13 % (ref 11.5–15.5)
WBC: 9.3 10*3/uL (ref 4.0–10.5)
nRBC: 0 % (ref 0.0–0.2)

## 2019-01-28 MED ORDER — HYOSCYAMINE SULFATE 0.125 MG SL SUBL
0.2500 mg | SUBLINGUAL_TABLET | Freq: Once | SUBLINGUAL | Status: AC
  Administered 2019-01-29: 01:00:00 0.25 mg via SUBLINGUAL
  Filled 2019-01-28: qty 2

## 2019-01-28 NOTE — Progress Notes (Addendum)
PROGRESS NOTE    Denise Payne  PPJ:[id number] DOB: May 05, 1946 DOA: 01/25/2019 PCP: Leeroy Cha, MD   Brief Narrative: Patient is a 73 year old female with history of asthma, Sjogren's syndrome who presents to the emergency department for the evaluation of abdominal pain.  She reported progressive generalized abdominal pain over the last 2 to 3 days with some nausea but no vomiting or diarrhea.  Reported passage of air during urination intermittently over the past year.  CT abdomen/pelvis was concerning for diverticulitis of the sigmoid colon with the inflamed segment contiguous with the anterior superior margin of the urinary bladder indicating a colovesical fistula with associated air  in the bladder.  CT also noted intraperitoneal free air suggesting microperforation.  Urology consulted,general surgery.  Started on conservative management with antibiotics and IV fluids with pain management.  Assessment & Plan:   Principal Problem:   Diverticulitis of large intestine with perforation Active Problems:   Colovesical fistula   Asthma   Sjogren's syndrome ([place])   Diverticulitis with microperforation/colovesicular fistula: Presented with 2 to 3 days of progressive abdominal pain with nausea.  No diarrhea, melena or hematochezia.  Found to be hemodynamically stable on presentation with leukocytosis.  CT finding as above.  Started on Zosyn.    Pain well controlled today. Continue conservative management with = pain medications, serial abdominal examination. General surgery consulted and following.   Started on clear liquid diet today.  Urine culture did not show any significant growth. Leukocytosis has resolved. Blood  Cultures no growth till date.  Asthma: Currently stable.  No cough or wheezing.  Continue as needed inhalers  History of Sjogren syndrome: On Plaquenil.  Stable          DVT prophylaxis:SCD Code Status: Full Family Communication: None present at the  bedside Disposition Plan: Home after resolution of abdominal pain, ability to tolerate solid food   Consultants: General surgery,Urology  Procedures: None  Antimicrobials:  Anti-infectives (From admission, onward)   Start     Dose/Rate Route Frequency Ordered Stop   01/26/19 1000  hydroxychloroquine (PLAQUENIL) tablet 200 mg     200 mg Oral 2 times daily 01/26/19 0041     01/26/19 0000  piperacillin-tazobactam (ZOSYN) IVPB 3.375 g  Status:  Discontinued     3.375 g 100 mL/hr over 30 Minutes Intravenous Every 6 hours 01/25/19 2138 01/25/19 2325   01/26/19 0000  piperacillin-tazobactam (ZOSYN) IVPB 3.375 g     3.375 g 12.5 mL/hr over 240 Minutes Intravenous Every 8 hours 01/25/19 2325     01/25/19 1845  piperacillin-tazobactam (ZOSYN) IVPB 3.375 g     3.375 g 100 mL/hr over 30 Minutes Intravenous  Once 01/25/19 1832 01/25/19 1927      Subjective: Patient seen and examined the bedside this morning.  Appears more comfortable today.  Abdomen pain much better.  No bowel movement yet.  Okay for clears.  Objective: Vitals:   01/27/19 2338 01/28/19 0404 01/28/19 0739 01/28/19 1124  BP: (!) 160/76 (!) 150/66 139/65 (!) 147/60  Pulse: 84 81 78 77  Resp: 16 18 16 20   Temp: 98.2 F (36.8 C) 98.3 F (36.8 C) 98.5 F (36.9 C) 98.4 F (36.9 C)  TempSrc: Oral Oral Oral Oral  SpO2: 97% 98% 97% 97%  Weight:      Height:        Intake/Output Summary (Last 24 hours) at 01/28/2019 1145 Last data filed at 01/28/2019 0300 Gross per 24 hour  Intake 1520.44 ml  Output 300 ml  Net 1220.44 ml   Filed Weights   01/25/19 1537  Weight: 81.6 kg    Examination:  General exam: Not in distress,average built HEENT:PERRL,Oral mucosa moist, Ear/Nose normal on gross exam Respiratory system: Bilateral equal air entry, normal vesicular breath sounds, no wheezes or crackles  Cardiovascular system: S1 & S2 heard, RRR. No JVD, murmurs, rubs, gallops or clicks. Gastrointestinal system: Abdomen is  nondistended, soft ,mild generalized tenderness but much better than yesterday, no organomegaly or masses felt. Normal bowel sounds heard. Central nervous system: Alert and oriented. No focal neurological deficits. Extremities: No edema, no clubbing ,no cyanosis, distal peripheral pulses palpable. Skin: No rashes, lesions or ulcers,no icterus ,no pallor  Data Reviewed: I have personally reviewed following labs and imaging studies  CBC: Recent Labs  Lab 01/25/19 1623 01/26/19 0914 01/27/19 0526 01/28/19 1030  WBC 17.1* 17.8* 13.6* 9.3  NEUTROABS 15.2*  --  11.9*  --   HGB 11.7* 10.9* 10.8* 10.2*  HCT 37.1 34.6* 34.7* 32.0*  MCV 90.3 88.3 87.8 87.7  PLT 272 247 265 585   Basic Metabolic Panel: Recent Labs  Lab 01/25/19 1623 01/26/19 0914  NA 137 137  K 3.4* 3.9  CL 102 106  CO2 23 21*  GLUCOSE 96 88  BUN 18 14  CREATININE 0.77 0.82  CALCIUM 9.0 8.6*   GFR: Estimated Creatinine Clearance: 61.2 mL/min (by C-G formula based on SCr of 0.82 mg/dL). Liver Function Tests: Recent Labs  Lab 01/25/19 1623 01/26/19 0914  AST 19 14*  ALT 17 15  ALKPHOS 86 84  BILITOT 0.9 1.0  PROT 7.0 6.1*  ALBUMIN 3.8 2.9*   Recent Labs  Lab 01/25/19 1623  LIPASE 26   No results for input(s): AMMONIA in the last 168 hours. Coagulation Profile: No results for input(s): INR, PROTIME in the last 168 hours. Cardiac Enzymes: No results for input(s): CKTOTAL, CKMB, CKMBINDEX, TROPONINI in the last 168 hours. BNP (last 3 results) No results for input(s): PROBNP in the last 8760 hours. HbA1C: No results for input(s): HGBA1C in the last 72 hours. CBG: No results for input(s): GLUCAP in the last 168 hours. Lipid Profile: No results for input(s): CHOL, HDL, LDLCALC, TRIG, CHOLHDL, LDLDIRECT in the last 72 hours. Thyroid Function Tests: No results for input(s): TSH, T4TOTAL, FREET4, T3FREE, THYROIDAB in the last 72 hours. Anemia Panel: No results for input(s): VITAMINB12, FOLATE,  FERRITIN, TIBC, IRON, RETICCTPCT in the last 72 hours. Sepsis Labs: No results for input(s): PROCALCITON, LATICACIDVEN in the last 168 hours.  Recent Results (from the past 240 hour(s))  Urine Culture     Status: Abnormal   Collection Time: 01/26/19  4:04 AM  Result Value Ref Range Status   Specimen Description URINE, CLEAN CATCH  Final   Special Requests NONE  Final   Culture (A)  Final    <10,000 COLONIES/mL INSIGNIFICANT GROWTH Performed at [facility], [address], [place], [place] [address]    Report Status 01/27/2019 FINAL  Final  Culture, blood (routine x 2)     Status: None (Preliminary result)   Collection Time: 01/26/19 10:25 AM  Result Value Ref Range Status   Specimen Description BLOOD LEFT HAND  Final   Special Requests AEROBIC BOTTLE ONLY Blood Culture adequate volume  Final   Culture   Final    NO GROWTH 1 DAY Performed at [facility], [place] [address] [place], [place] [address]    Report Status PENDING  Incomplete  Culture, blood (  routine x 2)     Status: None (Preliminary result)   Collection Time: 01/26/19 10:28 AM  Result Value Ref Range Status   Specimen Description BLOOD RIGHT ANTECUBITAL  Final   Special Requests AEROBIC BOTTLE ONLY Blood Culture adequate volume  Final   Culture   Final    NO GROWTH 1 DAY Performed at [facility], [place] [address], [place], [place] [address]    Report Status PENDING  Incomplete         Radiology Studies: No results found.      Scheduled Meds: . hydroxychloroquine  200 mg Oral BID  . ondansetron (ZOFRAN) IV  4 mg Intravenous Once  . pantoprazole (PROTONIX) IV  40 mg Intravenous QHS   Continuous Infusions: . sodium chloride 250 mL (01/28/19 0843)  . piperacillin-tazobactam (ZOSYN)  IV 12.5 mL/hr at 01/28/19 0845     LOS: 3 days    Time spent: 35 mins.More than 50% of that time was spent in counseling and/or coordination of care.      Shelly Coss, MD [facility]  Pager [phone]  If 7PM-7AM, please contact night-coverage [email or web address] Password [facility] 01/28/2019, 11:45 AM

## 2019-01-28 NOTE — Progress Notes (Signed)
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 Dema Severin    LOS: 3 days    Subjective: CC: lower abdominal pain  Pain improved today. Pt has not had pain medicine and her pain is a 3-4/10. It was an 8-9/10 yesterday. She denies issues overnight. No fever, chills or vomiting.   Objective: Vital signs in last 24 hours: Temp:  [98.2 F (36.8 C)-98.9 F (37.2 C)] 98.5 F (36.9 C) (04/14 0739) Pulse Rate:  [78-88] 78 (04/14 0739) Resp:  [16-18] 16 (04/14 0739) BP: (136-160)/(63-76) 139/65 (04/14 0739) SpO2:  [95 %-98 %] 97 % (04/14 0739) Last BM Date: 01/25/19  Intake/Output from previous day: 04/13 0701 - 04/14 0700 In: 1877.9 [I.V.:1861.6; IV Piggyback:16.3] Out: 300 [Urine:300] Intake/Output this shift: No intake/output data recorded.  PE: Gen:  Alert, NAD, pleasant, cooperative Pulm:  Rate and effort normal Abd: Soft, ND, +BS, TTP in LLQ and suprapubic region with guarding. No peritonitis  Skin: warm and dry   Anti-infectives: Anti-infectives (From admission, onward)   Start     Dose/Rate Route Frequency Ordered Stop   01/26/19 1000  hydroxychloroquine (PLAQUENIL) tablet 200 mg     200 mg Oral 2 times daily 01/26/19 0041     01/26/19 0000  piperacillin-tazobactam (ZOSYN) IVPB 3.375 g  Status:  Discontinued     3.375 g 100 mL/hr over 30 Minutes Intravenous Every 6 hours 01/25/19 2138 01/25/19 2325   01/26/19 0000  piperacillin-tazobactam (ZOSYN) IVPB 3.375 g     3.375 g 12.5 mL/hr over 240 Minutes Intravenous Every 8 hours 01/25/19 2325     01/25/19  1845  piperacillin-tazobactam (ZOSYN) IVPB 3.375 g     3.375 g 100 mL/hr over 30 Minutes Intravenous  Once 01/25/19 1832 01/25/19 1927      Lab Results:  Recent Labs    01/26/19 0914 01/27/19 0526  WBC 17.8* 13.6*  HGB 10.9* 10.8*  HCT 34.6* 34.7*  PLT 247 265   BMET Recent Labs    01/25/19 1623 01/26/19 0914  NA 137 137  K 3.4* 3.9  CL 102 106  CO2 23 21*  GLUCOSE 96 88  BUN 18 14  CREATININE 0.77 0.82  CALCIUM 9.0 8.6*   PT/INR No results for input(s): LABPROT, INR in the last 72 hours. CMP     Component Value Date/Time   NA 137 01/26/2019 0914   K 3.9 01/26/2019 0914   CL 106 01/26/2019 0914   CO2 21 (L) 01/26/2019 0914   GLUCOSE 88 01/26/2019 0914   BUN 14 01/26/2019 0914   CREATININE 0.82 01/26/2019 0914   CALCIUM 8.6 (L) 01/26/2019 0914   PROT 6.1 (L) 01/26/2019 0914   ALBUMIN 2.9 (L) 01/26/2019 0914   AST 14 (L) 01/26/2019 0914   ALT 15 01/26/2019 0914   ALKPHOS 84 01/26/2019 0914   BILITOT 1.0 01/26/2019 0914   GFRNONAA >60 01/26/2019 0914   GFRAA >60 01/26/2019 0914   Lipase     Component Value Date/Time   LIPASE 26 01/25/2019 1623    Studies/Results: No results found.    Kalman Drape ,  [facility] 01/28/2019, 9:53 AM  Pager: [phone] Mon-Wed, Friday 7:00am-4:30pm Thurs 7am-11:30am  Consults: [phone]

## 2019-01-29 LAB — CBC
HCT: 29.6 % — ABNORMAL LOW (ref 36.0–46.0)
Hemoglobin: 9.5 g/dL — ABNORMAL LOW (ref 12.0–15.0)
MCH: 27.9 pg (ref 26.0–34.0)
MCHC: 32.1 g/dL (ref 30.0–36.0)
MCV: 86.8 fL (ref 80.0–100.0)
Platelets: 270 10*3/uL (ref 150–400)
RBC: 3.41 MIL/uL — ABNORMAL LOW (ref 3.87–5.11)
RDW: 13.3 % (ref 11.5–15.5)
WBC: 9.4 10*3/uL (ref 4.0–10.5)
nRBC: 0 % (ref 0.0–0.2)

## 2019-01-29 MED ORDER — OXYCODONE HCL 5 MG PO TABS
5.0000 mg | ORAL_TABLET | ORAL | Status: DC | PRN
  Administered 2019-01-29 – 2019-02-07 (×11): 10 mg via ORAL
  Filled 2019-01-29 (×3): qty 2
  Filled 2019-01-29 (×2): qty 1
  Filled 2019-01-29 (×7): qty 2

## 2019-01-29 MED ORDER — ENOXAPARIN SODIUM 40 MG/0.4ML ~~LOC~~ SOLN
40.0000 mg | SUBCUTANEOUS | Status: DC
  Administered 2019-01-29 – 2019-02-07 (×9): 40 mg via SUBCUTANEOUS
  Filled 2019-01-29 (×10): qty 0.4

## 2019-01-29 MED ORDER — MONTELUKAST SODIUM 10 MG PO TABS
10.0000 mg | ORAL_TABLET | Freq: Every day | ORAL | Status: DC
  Administered 2019-01-29 – 2019-02-07 (×10): 10 mg via ORAL
  Filled 2019-01-29 (×10): qty 1

## 2019-01-29 NOTE — Progress Notes (Signed)
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  - we will continue to follow - pain worsening, WBC WNL  FEN: CLD VTE: SCD's, okay for lovenox from our standpoint but will defer to medicine ID: Zosyn 04/11>> Follow up: Dr. Dema Severin   LOS: 4 days    Subjective: CC: lower abdominal pain  Pain worsened with clears. She had severe sharp pains this am and cloudy stool like material in her urine. She states that is the first time that happened. Her urine has been clear until this am. She needed morphine this am for pain. I informed her to take it easy on the clear liquids and not drink too much today.   Objective: Vital signs in last 24 hours: Temp:  [98 F (36.7 C)-98.4 F (36.9 C)] 98.1 F (36.7 C) (04/15 0811) Pulse Rate:  [73-91] 86 (04/15 0811) Resp:  [18-20] 20 (04/15 0811) BP: (147-179)/(60-84) 179/84 (04/15 0811) SpO2:  [95 %-100 %] 100 % (04/15 0811) Last BM Date: 01/25/19  Intake/Output from previous day: 04/14 0701 - 04/15 0700 In: 275 [I.V.:55.5; IV Piggyback:219.6] Out: -  Intake/Output this shift: No intake/output data recorded.  PE: Gen:  Alert, NAD, pleasant, cooperative Pulm:  Rate and effort normal Abd: Soft, ND, +BS, worse TTP in LLQ and suprapubic region with guarding. No peritonitis  Skin: warm and dry    Anti-infectives: Anti-infectives (From admission, onward)   Start     Dose/Rate Route Frequency Ordered Stop   01/26/19 1000  hydroxychloroquine (PLAQUENIL) tablet 200 mg     200 mg Oral 2 times daily 01/26/19 0041     01/26/19 0000  piperacillin-tazobactam (ZOSYN) IVPB 3.375 g  Status:  Discontinued     3.375 g 100 mL/hr over 30 Minutes Intravenous Every 6  hours 01/25/19 2138 01/25/19 2325   01/26/19 0000  piperacillin-tazobactam (ZOSYN) IVPB 3.375 g     3.375 g 12.5 mL/hr over 240 Minutes Intravenous Every 8 hours 01/25/19 2325     01/25/19 1845  piperacillin-tazobactam (ZOSYN) IVPB 3.375 g     3.375 g 100 mL/hr over 30 Minutes Intravenous  Once 01/25/19 1832 01/25/19 1927      Lab Results:  Recent Labs    01/28/19 1030 01/29/19 0354  WBC 9.3 9.4  HGB 10.2* 9.5*  HCT 32.0* 29.6*  PLT 271 270   BMET Recent Labs    01/26/19 0914  NA 137  K 3.9  CL 106  CO2 21*  GLUCOSE 88  BUN 14  CREATININE 0.82  CALCIUM 8.6*   PT/INR No results for input(s): LABPROT, INR in the last 72 hours. CMP     Component Value Date/Time   NA 137 01/26/2019 0914   K 3.9 01/26/2019 0914   CL 106 01/26/2019 0914   CO2 21 (L) 01/26/2019 0914   GLUCOSE 88 01/26/2019 0914   BUN 14 01/26/2019 0914   CREATININE 0.82 01/26/2019 0914   CALCIUM 8.6 (L) 01/26/2019 0914   PROT 6.1 (L) 01/26/2019 0914   ALBUMIN 2.9 (L) 01/26/2019 0914   AST 14 (L) 01/26/2019 0914   ALT 15 01/26/2019 0914   ALKPHOS 84 01/26/2019 0914   BILITOT 1.0 01/26/2019 0914   GFRNONAA >60 01/26/2019 0914   GFRAA >60 01/26/2019 6759  Lipase     Component Value Date/Time   LIPASE 26 01/25/2019 1623    Studies/Results: No results found.    Kalman Drape , [facility] 01/29/2019, 9:08 AM  Pager: [phone] Mon-Wed, Friday 7:00am-4:30pm Thurs 7am-11:30am  Consults: [phone]

## 2019-01-29 NOTE — Progress Notes (Signed)
   Subjective/Chief Complaint:   1 - Colovesical Fistula from Diverticulitis - left anterior dome bladder - sigmoid connection by CT 4/11 on eval abdominal pain. 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 Cr 0.8. No large fluid collections / abscesses. Placed on IV Zosyn. Admits to pneumaturia that comes and goes x months.   2 - Bacteruria - bacteruria on UA as expected in setting of CV fistula. UCX 4/12 scant growth.  PMH c/w Sjogren's / plaquenill, obesity, benign hyst (pfannenstiel). She is retired Therapist, sports. Her PCP is  Dr. Clayton Bibles with [facility] [place].   Today "Denise Payne" is seen stable. Leukocytosis resolved, tollerating PO with bowel function, still some abdominal pain and debris in urine as anticipated.   Objective: Vital signs in last 24 hours: Temp:  [98 F (36.7 C)-98.4 F (36.9 C)] 98.1 F (36.7 C) (04/15 0811) Pulse Rate:  [73-91] 86 (04/15 0811) Resp:  [18-20] 20 (04/15 0811) BP: (147-179)/(60-84) 179/84 (04/15 0811) SpO2:  [95 %-100 %] 100 % (04/15 0811) Last BM Date: 01/25/19  Intake/Output from previous day: 04/14 0701 - 04/15 0700 In: 275 [I.V.:55.5; IV Piggyback:219.6] Out: -  Intake/Output this shift: No intake/output data recorded.   Physical Exam  Constitutional: She appears well-developed.  Some mild malaise, improved. Very pleasant.   HENT:  Head: Normocephalic.  Eyes: Pupils are equal, round, and reactive to light.  Cardiovascular: Normal rate.  Respiratory: Effort normal.  GI:  Obese with diffuse tenderness w/o rebound, improved. 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   Genitourinary:    Genitourinary Comments: No CVAT. NO catheters.    Musculoskeletal: Normal range of motion.  Neurological: She is alert.  Skin: Skin is warm.  Psychiatric: She has a normal mood and affect.   Lab Results:  Recent Labs    01/28/19 1030 01/29/19 0354  WBC 9.3 9.4  HGB 10.2* 9.5*  HCT 32.0* 29.6*  PLT 271 270   BMET No results for input(s): NA, K, CL, CO2, GLUCOSE, BUN,  CREATININE, CALCIUM in the last 72 hours. PT/INR No results for input(s): LABPROT, INR in the last 72 hours. ABG No results for input(s): PHART, HCO3 in the last 72 hours.  Invalid input(s): PCO2, PO2  Studies/Results: No results found.  Anti-infectives: Anti-infectives (From admission, onward)   Start     Dose/Rate Route Frequency Ordered Stop   01/26/19 1000  hydroxychloroquine (PLAQUENIL) tablet 200 mg     200 mg Oral 2 times daily 01/26/19 0041     01/26/19 0000  piperacillin-tazobactam (ZOSYN) IVPB 3.375 g  Status:  Discontinued     3.375 g 100 mL/hr over 30 Minutes Intravenous Every 6 hours 01/25/19 2138 01/25/19 2325   01/26/19 0000  piperacillin-tazobactam (ZOSYN) IVPB 3.375 g     3.375 g 12.5 mL/hr over 240 Minutes Intravenous Every 8 hours 01/25/19 2325     01/25/19 1845  piperacillin-tazobactam (ZOSYN) IVPB 3.375 g     3.375 g 100 mL/hr over 30 Minutes Intravenous  Once 01/25/19 1832 01/25/19 1927      Assessment/Plan:  Continue medical managent of acute diverticulitis. Discussed again that if fistula persists beyond several weeks then consider elective surgical repair in combined gen surg / urology approach. Reinforced expectations of abd pain and that some level is likely to persist as diverticulitis is resolving, but that resmed bowel function, resolution of elevated WBC encouraging clinical signs of improvement.   Please call me directly with questions anytime.   Alexis Frock 01/29/2019

## 2019-01-29 NOTE — Progress Notes (Signed)
Patient c/o severe 9/10 lower abd sharp, searing pain, vomited x 1, zofran and morphine given

## 2019-01-29 NOTE — Progress Notes (Signed)
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 CT abdomen/pelvis was concerning for diverticulitis of the sigmoid colon with the inflamed segment contiguous with the anterior superior margin of the urinary bladder indicating a colovesical fistula with associated air  in the bladder.  CT also noted intraperitoneal free air suggesting microperforation. General surgery and urology consulted and patient admitted with zosyn given.  Assessment & Plan: Principal Problem:   Diverticulitis of large intestine with perforation Active Problems:   Colovesical fistula   Asthma   Sjogren's syndrome ([place])  Acute sigmoid diverticulitis with microperforation and colovesical fistula: - Continuing zosyn - Continue analgesics, PO and IV options available prn - Diet advancement/decision on surgical timing per general surgery.  - Urology following, no clonal growth on UCx. Would perform concomitant surgery with gen surg when indicated. - No peritonitis on exam, WBC normalized, afebrile, though pain is significant.  Asthma: No acute exacerbation.  - Continue singulair and prn neb  Sjogren syndrome: Stable - Continue plaquenil.  DVT prophylaxis: Lovenox ok per surgery Code Status: Full Family Communication: None at bedside Disposition Plan: TBD  Consultants:   General surgery  Procedures:   Nonce  Antimicrobials:  Zosyn   Subjective: Abdominal pain worsened abruptly early this morning described as severe, "searing" across the lower abdomen improved with morphine. No vomiting.   Objective: Vitals:   01/28/19 2333 01/29/19 0401 01/29/19 0811 01/29/19 1143  BP: (!) 159/64 (!) 147/69 (!) 179/84 (!) 156/72   Pulse: 91 90 86 82  Resp: 18 18 20 18   Temp: 98 F (36.7 C) 98.3 F (36.8 C) 98.1 F (36.7 C) 97.8 F (36.6 C)  TempSrc: Oral Oral Oral Oral  SpO2: 98% 99% 100% 97%  Weight:      Height:        Intake/Output Summary (Last 24 hours) at 01/29/2019 1506 Last data filed at 01/29/2019 0100 Gross per 24 hour  Intake 275.04 ml  Output -  Net 275.04 ml   Filed Weights   01/25/19 1537  Weight: 81.6 kg    Gen: 73 y.o. female in no distress  Pulm: Non-labored breathing room air. Clear to auscultation bilaterally.  CV: Regular rate and rhythm. No murmur, rub, or gallop. No JVD, no pedal edema. GI: Abdomen soft, tender diffusely worst in LLQ, non-distended, with normoactive bowel sounds. No organomegaly or masses felt. Ext: Warm, no deformities Skin: No rashes, lesions or ulcers Neuro: Alert and oriented. No focal neurological deficits. Psych: Judgement and insight appear normal. Mood & affect appropriate.   Data Reviewed: I have personally reviewed following labs and imaging studies  CBC: Recent Labs  Lab 01/25/19 1623 01/26/19 0914 01/27/19 0526 01/28/19 1030 01/29/19 0354  WBC 17.1* 17.8* 13.6* 9.3 9.4  NEUTROABS 15.2*  --  11.9*  --   --   HGB 11.7* 10.9* 10.8* 10.2* 9.5*  HCT 37.1 34.6* 34.7* 32.0* 29.6*  MCV 90.3 88.3 87.8 87.7 86.8  PLT 272 247 265 271 811   Basic Metabolic Panel: Recent Labs  Lab 01/25/19 1623 01/26/19 0914  NA 137 137  K 3.4* 3.9  CL 102 106  CO2 23 21*  GLUCOSE 96 88  BUN 18 14  CREATININE 0.77 0.82  CALCIUM 9.0 8.6*   GFR:  Estimated Creatinine Clearance: 61.2 mL/min (by C-G formula based on SCr of 0.82 mg/dL). Liver Function Tests: Recent Labs  Lab 01/25/19 1623 01/26/19 0914  AST 19 14*  ALT 17 15  ALKPHOS 86 84  BILITOT 0.9 1.0  PROT 7.0 6.1*  ALBUMIN 3.8 2.9*   Recent Labs  Lab 01/25/19 1623  LIPASE 26   No results for input(s): AMMONIA in the last 168 hours. Coagulation Profile: No results for input(s): INR,  PROTIME in the last 168 hours. Cardiac Enzymes: No results for input(s): CKTOTAL, CKMB, CKMBINDEX, TROPONINI in the last 168 hours. BNP (last 3 results) No results for input(s): PROBNP in the last 8760 hours. HbA1C: No results for input(s): HGBA1C in the last 72 hours. CBG: No results for input(s): GLUCAP in the last 168 hours. Lipid Profile: No results for input(s): CHOL, HDL, LDLCALC, TRIG, CHOLHDL, LDLDIRECT in the last 72 hours. Thyroid Function Tests: No results for input(s): TSH, T4TOTAL, FREET4, T3FREE, THYROIDAB in the last 72 hours. Anemia Panel: No results for input(s): VITAMINB12, FOLATE, FERRITIN, TIBC, IRON, RETICCTPCT in the last 72 hours. Urine analysis:    Component Value Date/Time   COLORURINE AMBER (A) 01/25/2019 1823   APPEARANCEUR CLOUDY (A) 01/25/2019 1823   LABSPEC 1.025 01/25/2019 1823   PHURINE 6.0 01/25/2019 1823   GLUCOSEU NEGATIVE 01/25/2019 1823   HGBUR SMALL (A) 01/25/2019 1823   BILIRUBINUR NEGATIVE 01/25/2019 1823   KETONESUR 40 (A) 01/25/2019 1823   PROTEINUR 30 (A) 01/25/2019 1823   NITRITE POSITIVE (A) 01/25/2019 1823   LEUKOCYTESUR TRACE (A) 01/25/2019 1823   Recent Results (from the past 240 hour(s))  Urine Culture     Status: Abnormal   Collection Time: 01/26/19  4:04 AM  Result Value Ref Range Status   Specimen Description URINE, CLEAN CATCH  Final   Special Requests NONE  Final   Culture (A)  Final    <10,000 COLONIES/mL INSIGNIFICANT GROWTH Performed at [facility], [place]. [address], [place], [place] [address]    Report Status 01/27/2019 FINAL  Final  Culture, blood (routine x 2)     Status: None (Preliminary result)   Collection Time: 01/26/19 10:25 AM  Result Value Ref Range Status   Specimen Description BLOOD LEFT HAND  Final   Special Requests AEROBIC BOTTLE ONLY Blood Culture adequate volume  Final   Culture   Final    NO GROWTH 3 DAYS Performed at [facility], [place] [address] [place], [place] [address]     Report Status PENDING  Incomplete  Culture, blood (routine x 2)     Status: None (Preliminary result)   Collection Time: 01/26/19 10:28 AM  Result Value Ref Range Status   Specimen Description BLOOD RIGHT ANTECUBITAL  Final   Special Requests AEROBIC BOTTLE ONLY Blood Culture adequate volume  Final   Culture   Final    NO GROWTH 3 DAYS Performed at [facility], [place] [address], [place], [place] [address]    Report Status PENDING  Incomplete      Radiology Studies: No results found.  Scheduled Meds: . hydroxychloroquine  200 mg Oral BID  . ondansetron (ZOFRAN) IV  4 mg Intravenous Once  . pantoprazole (PROTONIX) IV  40 mg Intravenous QHS   Continuous Infusions: . sodium chloride Stopped (01/28/19 1529)  . piperacillin-tazobactam (ZOSYN)  IV 3.375 g (01/29/19 0816)     LOS: 4 days   Time spent: 25 minutes.  Patrecia Pour, MD [facility] [email or web address]  Password [email or web address] 01/29/2019, 3:06 PM

## 2019-01-30 LAB — COMPREHENSIVE METABOLIC PANEL
ALT: 14 U/L (ref 0–44)
AST: 16 U/L (ref 15–41)
Albumin: 2.3 g/dL — ABNORMAL LOW (ref 3.5–5.0)
Alkaline Phosphatase: 105 U/L (ref 38–126)
Anion gap: 12 (ref 5–15)
BUN: 15 mg/dL (ref 8–23)
CO2: 23 mmol/L (ref 22–32)
Calcium: 8.2 mg/dL — ABNORMAL LOW (ref 8.9–10.3)
Chloride: 106 mmol/L (ref 98–111)
Creatinine, Ser: 0.75 mg/dL (ref 0.44–1.00)
GFR calc Af Amer: >60 mL/min (ref >60)
GFR calc non Af Amer: >60 mL/min (ref >60)
Glucose, Bld: 77 mg/dL (ref 70–99)
Potassium: 3 mmol/L — ABNORMAL LOW (ref 3.5–5.1)
Sodium: 141 mmol/L (ref 135–145)
Total Bilirubin: 1.1 mg/dL (ref 0.3–1.2)
Total Protein: 5.6 g/dL — ABNORMAL LOW (ref 6.5–8.1)

## 2019-01-30 LAB — CBC
HCT: 32.2 % — ABNORMAL LOW (ref 36.0–46.0)
Hemoglobin: 10.4 g/dL — ABNORMAL LOW (ref 12.0–15.0)
MCH: 28.2 pg (ref 26.0–34.0)
MCHC: 32.3 g/dL (ref 30.0–36.0)
MCV: 87.3 fL (ref 80.0–100.0)
Platelets: 313 10*3/uL (ref 150–400)
RBC: 3.69 MIL/uL — ABNORMAL LOW (ref 3.87–5.11)
RDW: 13.4 % (ref 11.5–15.5)
WBC: 16.6 10*3/uL — ABNORMAL HIGH (ref 4.0–10.5)
nRBC: 0 % (ref 0.0–0.2)

## 2019-01-30 MED ORDER — CHLORHEXIDINE GLUCONATE CLOTH 2 % EX PADS: 6.0000 | MEDICATED_PAD | Freq: Every day | CUTANEOUS | Status: DC

## 2019-01-30 MED ORDER — KCL IN DEXTROSE-NACL 20-5-0.45 MEQ/L-%-% IV SOLN
INTRAVENOUS | Status: DC
  Administered 2019-01-30 (×2): via INTRAVENOUS
  Filled 2019-01-30 (×2): qty 1000

## 2019-01-30 MED ORDER — MUPIROCIN 2 % EX OINT
1.0000 "application " | TOPICAL_OINTMENT | Freq: Two times a day (BID) | CUTANEOUS | Status: DC
  Filled 2019-01-30: qty 22

## 2019-01-30 NOTE — Progress Notes (Signed)
Subjective: CC: Abdominal Pain Patient reports since seeing [place] yesterday and being placed on ice chips her pain has nearly subsided. She feels much better today. The crampy pain and sharp pains she felt yesterday has not returned. She has not required pain medication overnight or this AM. She has not had any further episodes of stool like material in her urine. It has been clear. Her nausea has resolved and she is without any emesis. She continues to pass gas. She had a BM yesterday that was normal with some mucus on the tissue paper. No melena or hematochezia. She has been mobilizing.   Objective: Vital signs in last 24 hours: Temp:  [97.8 F (36.6 C)-99.5 F (37.5 C)] 98.8 F (37.1 C) (04/16 0805) Pulse Rate:  [82-93] 89 (04/16 0805) Resp:  [16-20] 17 (04/16 0805) BP: (131-174)/(66-79) 174/79 (04/16 0805) SpO2:  [94 %-97 %] 94 % (04/16 0805) Last BM Date: 01/29/19  Intake/Output from previous day: No intake/output data recorded. Intake/Output this shift: No intake/output data recorded.  PE: Gen: Alert, NAD, pleasant, cooperative Heart: RRR Pulm:Rate andeffort normal, CTA b/l  Abd: Soft, ND,+BS, some mild TTP in LLQ and suprapubic region without r/r/g. No peritonitis. She reports her tenderness is much improved from yesterday.  Skin: warm and dry  Lab Results:  Recent Labs    01/29/19 0354 01/30/19 0535  WBC 9.4 16.6*  HGB 9.5* 10.4*  HCT 29.6* 32.2*  PLT 270 313   BMET No results for input(s): NA, K, CL, CO2, GLUCOSE, BUN, CREATININE, CALCIUM in the last 72 hours. PT/INR No results for input(s): LABPROT, INR in the last 72 hours. CMP     Component Value Date/Time   NA 137 01/26/2019 0914   K 3.9 01/26/2019 0914   CL 106 01/26/2019 0914   CO2 21 (L) 01/26/2019 0914   GLUCOSE 88 01/26/2019 0914   BUN 14 01/26/2019 0914   CREATININE 0.82 01/26/2019 0914   CALCIUM 8.6 (L) 01/26/2019 0914   PROT 6.1 (L) 01/26/2019 0914   ALBUMIN 2.9 (L) 01/26/2019  0914   AST 14 (L) 01/26/2019 0914   ALT 15 01/26/2019 0914   ALKPHOS 84 01/26/2019 0914   BILITOT 1.0 01/26/2019 0914   GFRNONAA >60 01/26/2019 0914   GFRAA >60 01/26/2019 0914   Lipase     Component Value Date/Time   LIPASE 26 01/25/2019 1623       Studies/Results: No results found.  Anti-infectives: Anti-infectives (From admission, onward)   Start     Dose/Rate Route Frequency Ordered Stop   01/26/19 1000  hydroxychloroquine (PLAQUENIL) tablet 200 mg     200 mg Oral 2 times daily 01/26/19 0041     01/26/19 0000  piperacillin-tazobactam (ZOSYN) IVPB 3.375 g  Status:  Discontinued     3.375 g 100 mL/hr over 30 Minutes Intravenous Every 6 hours 01/25/19 2138 01/25/19 2325   01/26/19 0000  piperacillin-tazobactam (ZOSYN) IVPB 3.375 g     3.375 g 12.5 mL/hr over 240 Minutes Intravenous Every 8 hours 01/25/19 2325     01/25/19 1845  piperacillin-tazobactam (ZOSYN) IVPB 3.375 g     3.375 g 100 mL/hr over 30 Minutes Intravenous  Once 01/25/19 1832 01/25/19 1927       Assessment/Plan  Asthma Sjogren's syndrome   Sigmoid Diverticulitis with microperforation Colovesical fistula-seen on CT 4/11 - WBCuptrending this AM, from 9.4 to 16.6. Afebrile. No tachycardia or hypotension. Abdominal exam improved. No peritonitis.  - Will restart on CLD -  No indications for urgent or emergent surgical intervention at this time but if pain worsens on CLD again and WBC continues to elevate then she may need surgery.  - Urology following. Appreciate their input - Mobilize, IS -we will continue to follow  FEN:CLD, IVF, K 3.0 (K being replaced in IVF) VTE: SCD's,Lovenox, moblize UV:OZDGU 04/11>> Follow up:Dr. Dema Severin    LOS: 5 days    Jillyn Ledger , [facility] 01/30/2019, 9:38 AM Pager: [phone]

## 2019-01-30 NOTE — Progress Notes (Addendum)
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 CT abdomen/pelvis was concerning for diverticulitis of the sigmoid colon with the inflamed segment contiguous with the anterior superior margin of the urinary bladder indicating a colovesical fistula with associated air  in the bladder.  CT also noted intraperitoneal free air suggesting microperforation. General surgery and urology consulted and patient admitted with zosyn given.  Assessment & Plan: Principal Problem:   Diverticulitis of large intestine with perforation Active Problems:   Colovesical fistula   Asthma   Sjogren's syndrome ([place])  Acute sigmoid diverticulitis with microperforation and colovesical fistula: - Continuing zosyn - Continue analgesics, PO and IV options available prn - Will add IVF as she'll remain NPO, suspect some of leukocytosis is hemoconcentration as all cell lines up.  - Urology following, no clonal growth on UCx. Would perform concomitant surgery with gen surg when indicated. - Abd exam stable/subjectively improved without need for analgesics once reverted back to NPO/ice chips. WBC significantly up without fever. ?spurious result vs. abscess development. Agree with surgery to keep NPO, low threshold for CT if WBC not improved or symptoms recur 4/17.    Hypokalemia:  - Supplement in IVF, recheck in AM.  Asthma: No acute exacerbation.  - Continue singulair and prn neb  Sjogren syndrome: Stable. - Continue plaquenil.  DVT prophylaxis: Lovenox ok per surgery Code Status: Full Family Communication: None at bedside Disposition Plan: TBD  Consultants:   General surgery  Urology  Procedures:   Nonce  Antimicrobials:   Zosyn   Subjective: Feels generally much better since going back to NPO, though "that grape juice is calling to me." No nausea, vomiting, abd pain, no pain medications needed in 24 hours. Denies fever, chills.    Objective: Vitals:   01/29/19 2348 01/30/19 0353 01/30/19 0805 01/30/19 1226  BP: 131/66 (!) 157/71 (!) 174/79 (!) 158/67  Pulse: 91 86 89 79  Resp: 19 16 17 20   Temp: 98.9 F (37.2 C) 99.5 F (37.5 C) 98.8 F (37.1 C) 98.6 F (37 C)  TempSrc: Oral Oral Oral Oral  SpO2: 94% 95% 94% 97%  Weight:      Height:        Intake/Output Summary (Last 24 hours) at 01/30/2019 1243 Last data filed at 01/30/2019 0500 Gross per 24 hour  Intake 0 ml  Output -  Net 0 ml   Filed Weights   01/25/19 1537  Weight: 81.6 kg   Gen: 73 y.o. female in no distress Pulm: Nonlabored breathing room air. Clear. CV: Regular rate and rhythm. No murmur, rub, or gallop. No JVD, no dependent edema. GI: Abdomen soft, minimally tender worst in LLQ without rebound or guarding. Non-distended, with hypoactive bowel sounds.  Ext: Warm, no deformities Skin: No rashes, lesions or ulcers on visualized skin. Neuro: Alert and oriented. No focal neurological deficits. Psych: Judgement and insight appear fair. Mood euthymic & affect congruent. Behavior is appropriate.  Data Reviewed: I have personally reviewed following labs and imaging studies  CBC: Recent Labs  Lab 01/25/19 1623 01/26/19 0914 01/27/19 0526 01/28/19 1030 01/29/19 0354 01/30/19 0535  WBC 17.1* 17.8* 13.6* 9.3 9.4 16.6*  NEUTROABS 15.2*  --  11.9*  --   --   --   HGB 11.7*  10.9* 10.8* 10.2* 9.5* 10.4*  HCT 37.1 34.6* 34.7* 32.0* 29.6* 32.2*  MCV 90.3 88.3 87.8 87.7 86.8 87.3  PLT 272 247 265 271 270 706   Basic Metabolic Panel: Recent Labs  Lab 01/25/19 1623 01/26/19 0914 01/30/19 0830  NA 137 137 141  K 3.4* 3.9 3.0*  CL 102 106 106  CO2 23 21* 23  GLUCOSE 96 88 77  BUN 18 14 15   CREATININE 0.77 0.82 0.75  CALCIUM  9.0 8.6* 8.2*   GFR: Estimated Creatinine Clearance: 62.7 mL/min (by C-G formula based on SCr of 0.75 mg/dL). Liver Function Tests: Recent Labs  Lab 01/25/19 1623 01/26/19 0914 01/30/19 0830  AST 19 14* 16  ALT 17 15 14   ALKPHOS 86 84 105  BILITOT 0.9 1.0 1.1  PROT 7.0 6.1* 5.6*  ALBUMIN 3.8 2.9* 2.3*   Recent Labs  Lab 01/25/19 1623  LIPASE 26   No results for input(s): AMMONIA in the last 168 hours. Coagulation Profile: No results for input(s): INR, PROTIME in the last 168 hours. Cardiac Enzymes: No results for input(s): CKTOTAL, CKMB, CKMBINDEX, TROPONINI in the last 168 hours. BNP (last 3 results) No results for input(s): PROBNP in the last 8760 hours. HbA1C: No results for input(s): HGBA1C in the last 72 hours. CBG: No results for input(s): GLUCAP in the last 168 hours. Lipid Profile: No results for input(s): CHOL, HDL, LDLCALC, TRIG, CHOLHDL, LDLDIRECT in the last 72 hours. Thyroid Function Tests: No results for input(s): TSH, T4TOTAL, FREET4, T3FREE, THYROIDAB in the last 72 hours. Anemia Panel: No results for input(s): VITAMINB12, FOLATE, FERRITIN, TIBC, IRON, RETICCTPCT in the last 72 hours. Urine analysis:    Component Value Date/Time   COLORURINE AMBER (A) 01/25/2019 1823   APPEARANCEUR CLOUDY (A) 01/25/2019 1823   LABSPEC 1.025 01/25/2019 1823   PHURINE 6.0 01/25/2019 1823   GLUCOSEU NEGATIVE 01/25/2019 1823   HGBUR SMALL (A) 01/25/2019 1823   BILIRUBINUR NEGATIVE 01/25/2019 1823   KETONESUR 40 (A) 01/25/2019 1823   PROTEINUR 30 (A) 01/25/2019 1823   NITRITE POSITIVE (A) 01/25/2019 1823   LEUKOCYTESUR TRACE (A) 01/25/2019 1823   Recent Results (from the past 240 hour(s))  Urine Culture     Status: Abnormal   Collection Time: 01/26/19  4:04 AM  Result Value Ref Range Status   Specimen Description URINE, CLEAN CATCH  Final   Special Requests NONE  Final   Culture (A)  Final    <10,000 COLONIES/mL INSIGNIFICANT GROWTH Performed at [facility], [place]. [address], [place], [place] [address]    Report Status 01/27/2019 FINAL  Final  Culture, blood (routine x 2)     Status: None (Preliminary result)   Collection Time: 01/26/19 10:25 AM  Result Value Ref Range Status   Specimen Description BLOOD LEFT HAND  Final   Special Requests AEROBIC BOTTLE ONLY Blood Culture adequate volume  Final   Culture   Final    NO GROWTH 4 DAYS Performed at [facility], [place] [address] [place], [place] [address]    Report Status PENDING  Incomplete  Culture, blood (routine x 2)     Status: None (Preliminary result)   Collection Time: 01/26/19 10:28 AM  Result Value Ref Range Status   Specimen Description BLOOD RIGHT ANTECUBITAL  Final   Special Requests AEROBIC BOTTLE ONLY Blood Culture adequate volume  Final   Culture   Final    NO GROWTH 4 DAYS Performed at [facility], [address] [place], [place] [address]    Report Status PENDING  Incomplete      Radiology Studies: No results found.  Scheduled Meds: . enoxaparin (LOVENOX) injection  40 mg Subcutaneous Q24H  . hydroxychloroquine  200 mg Oral BID  . montelukast  10 mg Oral QHS  . ondansetron (ZOFRAN) IV  4 mg Intravenous Once  . pantoprazole (PROTONIX) IV  40 mg Intravenous QHS   Continuous Infusions: . sodium chloride Stopped (01/28/19 1529)  . dextrose 5 % and 0.45 % NaCl with KCl 20 mEq/L 100 mL/hr at 01/30/19 0834  . piperacillin-tazobactam (ZOSYN)  IV 3.375 g (01/30/19 0837)     LOS: 5 days   Time spent: 25 minutes.  Patrecia Pour, MD [facility] [email or web address] Password [facility] 01/30/2019, 12:43 PM

## 2019-01-31 ENCOUNTER — Inpatient Hospital Stay (HOSPITAL_COMMUNITY): Payer: Medicare Other

## 2019-01-31 LAB — CULTURE, BLOOD (ROUTINE X 2)
Culture: NO GROWTH
Culture: NO GROWTH
Special Requests: ADEQUATE
Special Requests: ADEQUATE

## 2019-01-31 LAB — CBC WITH DIFFERENTIAL/PLATELET
Abs Immature Granulocytes: 0.26 10*3/uL — ABNORMAL HIGH (ref 0.00–0.07)
Basophils Absolute: 0.1 10*3/uL (ref 0.0–0.1)
Basophils Relative: 0 %
Eosinophils Absolute: 0.2 10*3/uL (ref 0.0–0.5)
Eosinophils Relative: 2 %
HCT: 30.2 % — ABNORMAL LOW (ref 36.0–46.0)
Hemoglobin: 9.4 g/dL — ABNORMAL LOW (ref 12.0–15.0)
Immature Granulocytes: 2 %
Lymphocytes Relative: 11 %
Lymphs Abs: 1.5 10*3/uL (ref 0.7–4.0)
MCH: 27 pg (ref 26.0–34.0)
MCHC: 31.1 g/dL (ref 30.0–36.0)
MCV: 86.8 fL (ref 80.0–100.0)
Monocytes Absolute: 0.8 10*3/uL (ref 0.1–1.0)
Monocytes Relative: 6 %
Neutro Abs: 10.8 10*3/uL — ABNORMAL HIGH (ref 1.7–7.7)
Neutrophils Relative %: 79 %
Platelets: 355 10*3/uL (ref 150–400)
RBC: 3.48 MIL/uL — ABNORMAL LOW (ref 3.87–5.11)
RDW: 13.4 % (ref 11.5–15.5)
WBC: 13.7 10*3/uL — ABNORMAL HIGH (ref 4.0–10.5)
nRBC: 0 % (ref 0.0–0.2)

## 2019-01-31 LAB — BASIC METABOLIC PANEL
Anion gap: 10 (ref 5–15)
BUN: 15 mg/dL (ref 8–23)
CO2: 26 mmol/L (ref 22–32)
Calcium: 8.3 mg/dL — ABNORMAL LOW (ref 8.9–10.3)
Chloride: 106 mmol/L (ref 98–111)
Creatinine, Ser: 0.82 mg/dL (ref 0.44–1.00)
GFR calc Af Amer: >60 mL/min (ref >60)
GFR calc non Af Amer: >60 mL/min (ref >60)
Glucose, Bld: 97 mg/dL (ref 70–99)
Potassium: 2.9 mmol/L — ABNORMAL LOW (ref 3.5–5.1)
Sodium: 142 mmol/L (ref 135–145)

## 2019-01-31 IMAGING — CT CT ABDOMEN AND PELVIS WITH CONTRAST
2 of 5 series · 15 of 46 positions shown, 17 images · IV contrast (APPLIED)
Comparison: [DATE]

CLINICAL DATA: Follow-up diverticulitis

EXAM:
CT ABDOMEN AND PELVIS WITH CONTRAST
TECHNIQUE: Multidetector CT imaging of the abdomen and pelvis was performed
using the standard protocol following bolus administration of
intravenous contrast.
CONTRAST:  100mL OMNIPAQUE IOHEXOL 300 MG/ML  SOLN

[Series 3: abdomen 5.0 · axial · 0.95mm/px · z∈[+664,+1068]mm · 12 of 95 slices shown, 14 images]
[im 7/95  soft-tissue]
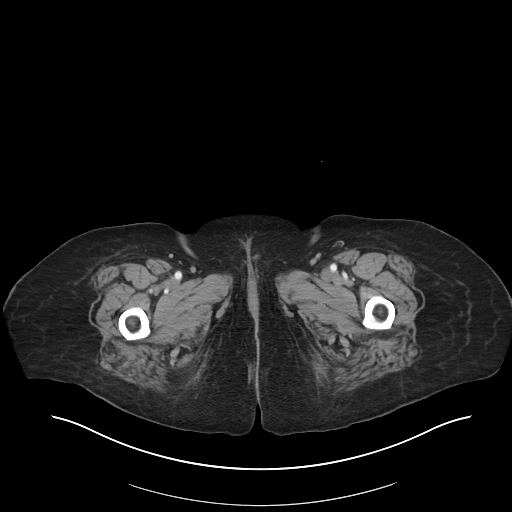
[im 7/95  bone]
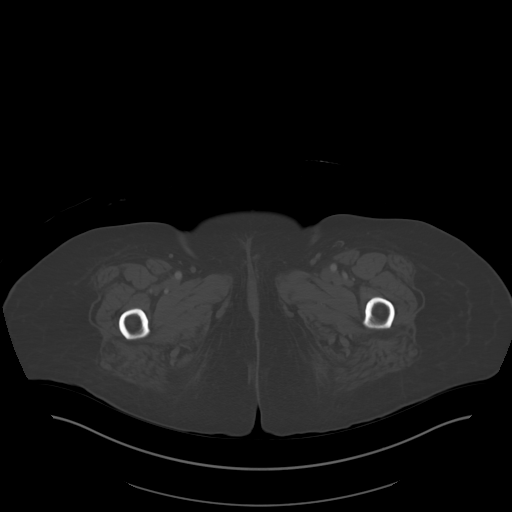
[im 13/95  soft-tissue]
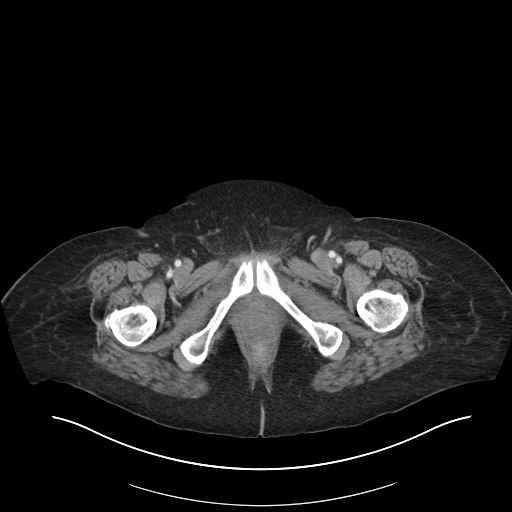
[im 19/95  soft-tissue]
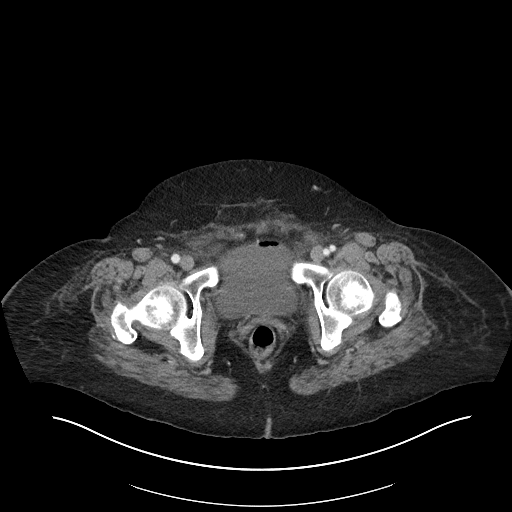
[im 32/95  soft-tissue]
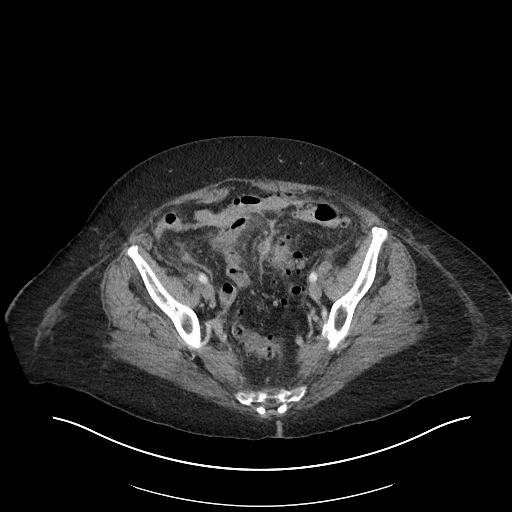
[im 38/95  soft-tissue]
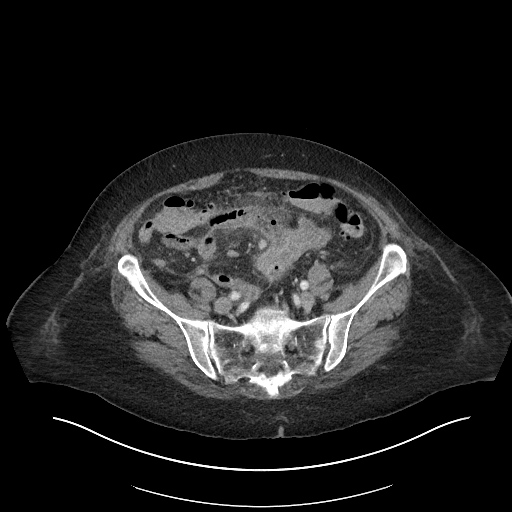
[im 44/95  soft-tissue]
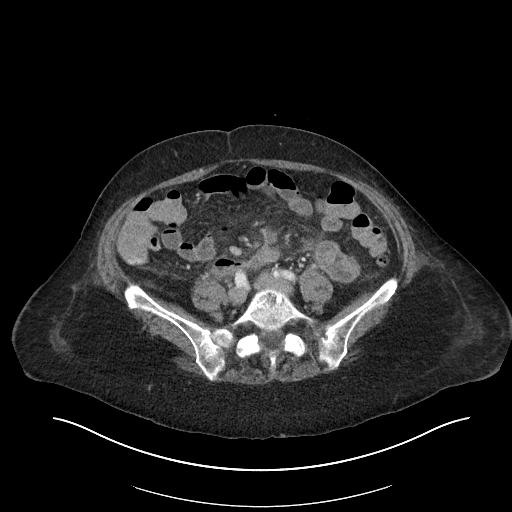
[im 51/95  soft-tissue]
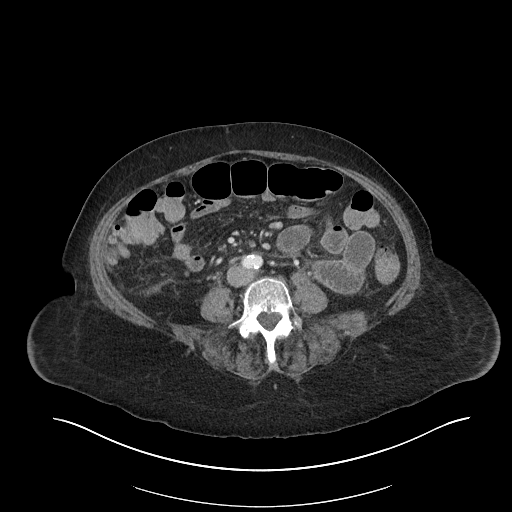
[im 57/95  soft-tissue]
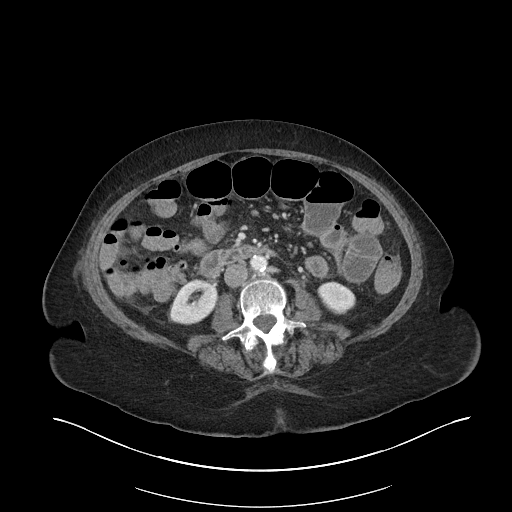
[im 63/95  soft-tissue]
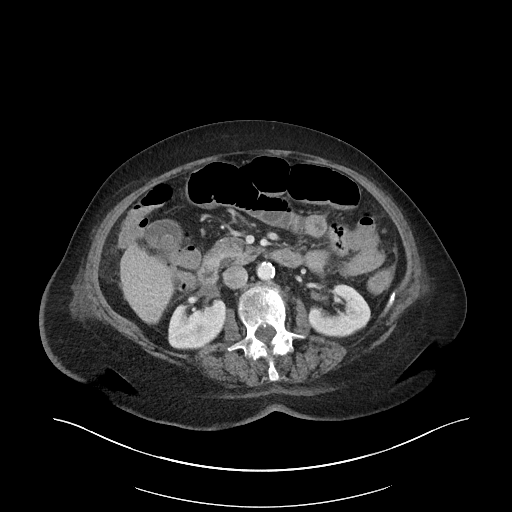
[im 63/95  bone]
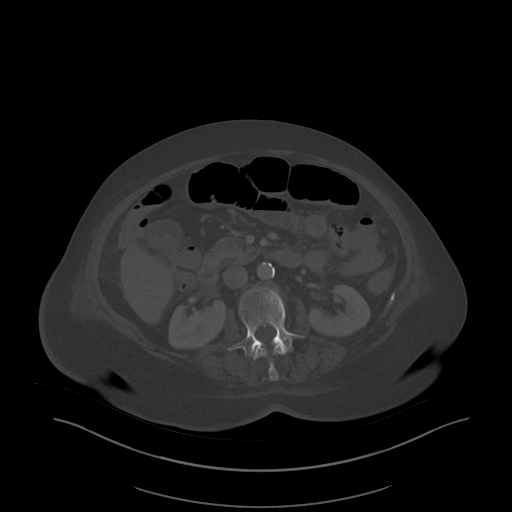
[im 76/95  soft-tissue]
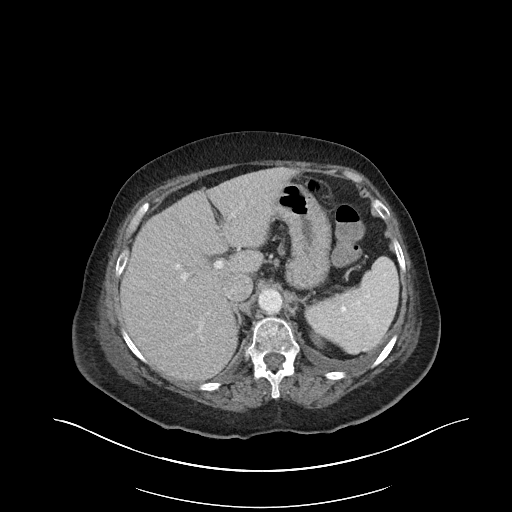
[im 82/95  soft-tissue]
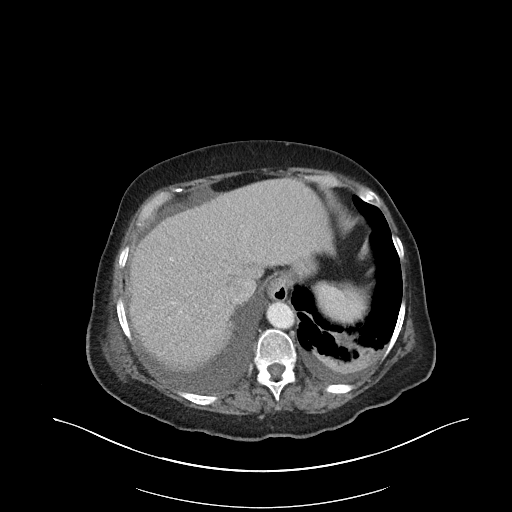
[im 88/95  soft-tissue]
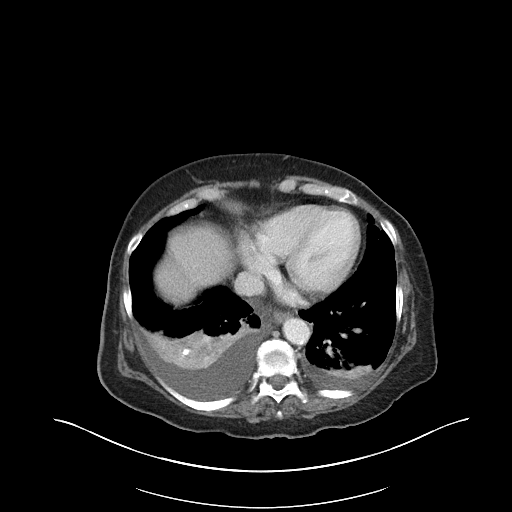

[Series 6: abdomen 3.0 mpr cor · coronal · 0.93mm/px · 3 of 118 slices shown]
[im 40/118  soft-tissue]
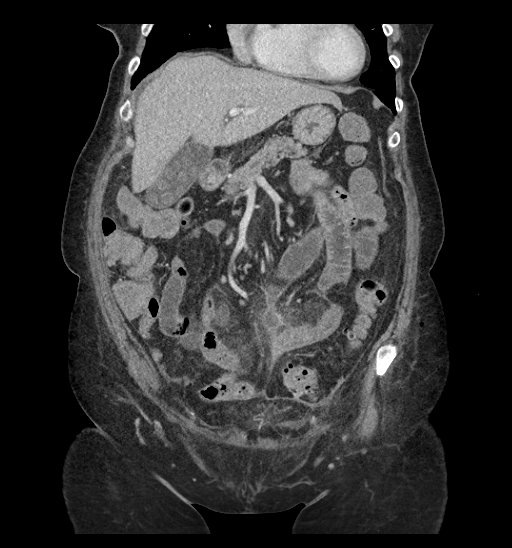
[im 53/118  soft-tissue]
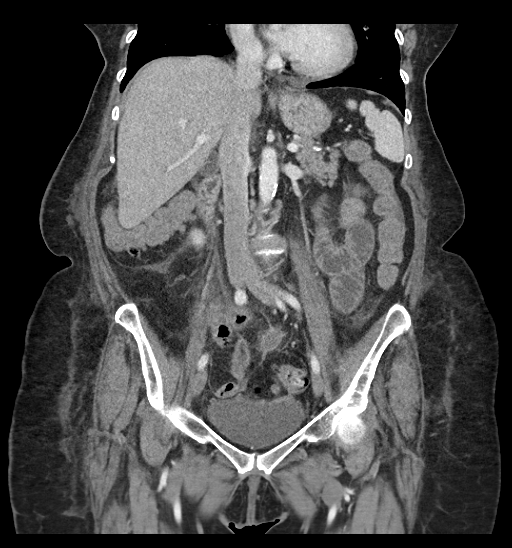
[im 66/118  soft-tissue]
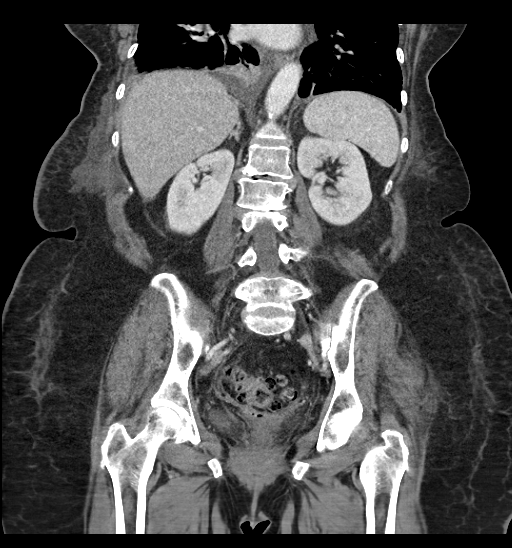

[15 of 46 positions shown; findings below may reference images not displayed]

FINDINGS: Lower chest: Moderate right right and small left pleural effusion
with dependent atelectasis. Interlobular septal thickening at the
lung bases likely represents a small amount of edema.

Hepatobiliary: Cholelithiasis.  Liver is unremarkable.

Pancreas: Unremarkable

Spleen: Calcified granuloma.  Otherwise unremarkable.

Adrenals/Urinary Tract: Kidneys are within normal limits. Adrenal
glands are unremarkable. Bladder contains a small amount of gas.
There is a 1.7 x 1.5 cm fluid and gas collection at the dome of the
bladder of presenting either a large diverticulum or possibly a
small abscess. These findings raise the possibility of fistula
between sigmoid colon and bladder.

Stomach/Bowel: There are inflammatory changes of the sigmoid colon
adjacent to the dome of the bladder. This is characterized by wall
thickening and stranding in the adjacent fat. Diverticulosis of the
sigmoid and descending colon are noted. There is a gas and fluid
collection inseparable from the sigmoid colon and dome of the
bladder representing either a large inflamed diverticulum or
possibly a small abscess. This may indicate a fistula.

Distended small bowel loops with air-fluid levels are present in the
upper abdomen. Distal small bowel loops are decompressed. The
transition point is in the inflammatory process within the pelvis.

Vascular/Lymphatic: Atherosclerotic vascular calcifications. No
abnormal retroperitoneal adenopathy. Several prominent but non
pathologically enlarged mesenteric nodes are noted and are likely
inflammatory in nature.

Reproductive: Uterus is absent.

Other: There is stranding and inflammatory changes within the
anterior pelvic fat adjacent to the area of diverticulitis. Multiple
very small gas and fluid filled abscesses are present in the lower
abdomen and upper pelvis. The largest measures 3.6 x 1.5 cm. A loop
of bowel is seen anterior to this fluid collection.

Small amount of free fluid about the liver.

Musculoskeletal: No vertebral compression deformity.
IMPRESSION: Findings are again consistent with acute sigmoid diverticulitis.

There are findings worrisome for a fistula between the sigmoid colon
and bladder as described above.

There are gas and fluid filled collections in the lower abdomen and
upper pelvis compatible with multiple small abscesses. The largest
is 3.6 x 1.5 cm. It is posterior to a loop of bowel and safe access
for percutaneous drainage is not possible.

Partial small bowel obstruction pattern. The transition point is in
the pelvis at the site of the inflammatory process.

Cholelithiasis.

Bilateral pleural effusions.

## 2019-01-31 MED ORDER — MAGNESIUM SULFATE 2 GM/50ML IV SOLN
2.0000 g | Freq: Once | INTRAVENOUS | Status: AC
  Administered 2019-01-31: 08:00:00 2 g via INTRAVENOUS
  Filled 2019-01-31: qty 50

## 2019-01-31 MED ORDER — KCL IN DEXTROSE-NACL 40-5-0.45 MEQ/L-%-% IV SOLN
INTRAVENOUS | Status: DC
  Administered 2019-01-31 – 2019-02-07 (×9): via INTRAVENOUS
  Filled 2019-01-31 (×9): qty 1000

## 2019-01-31 MED ORDER — HYDRALAZINE HCL 20 MG/ML IJ SOLN: 10.0000 mg | INTRAMUSCULAR | Status: DC | PRN

## 2019-01-31 MED ORDER — IOHEXOL 300 MG/ML  SOLN
100.0000 mL | Freq: Once | INTRAMUSCULAR | Status: AC | PRN
  Administered 2019-01-31: 100 mL via INTRAVENOUS

## 2019-01-31 MED ORDER — GUAIFENESIN ER 600 MG PO TB12
600.0000 mg | ORAL_TABLET | Freq: Two times a day (BID) | ORAL | Status: DC | PRN
  Administered 2019-01-31: 600 mg via ORAL
  Filled 2019-01-31: qty 1

## 2019-01-31 NOTE — Progress Notes (Signed)
Initial Nutrition Assessment   RD working remotely.  DOCUMENTATION CODES:   Obesity unspecified  INTERVENTION:  Diet advancement as appropriate per MD team.  RD to continue to monitor.   NUTRITION DIAGNOSIS:   Inadequate oral intake related to inability to eat as evidenced by NPO status.  GOAL:   Patient will meet greater than or equal to 90% of their needs  MONITOR:   Diet advancement, Weight trends, Labs, Skin, I & O's  REASON FOR ASSESSMENT:   NPO/Clear Liquid Diet    ASSESSMENT:   73 y.o. female with medical history significant for asthma and Sjogren syndrome, presenting to emergency department for evaluation of abdominal pain. CT the abdomen and pelvis is concerning for diverticulitis of the sigmoid colon with the inflamed segment contiguous with the anterior-superior margin of the urinary bladder indicating a colovesical fistula with associated air in the bladder..  Pt has remains NPO for CT today of intra abdominal abscess. Abdominal pains continue to improve. Per MD, diverticulitis improving. RD to order nutritional supplements as appropriate when diet advances. Pt with no weight loss per weight records.  Unable to complete Nutrition-Focused physical exam at this time.   Labs and medications reviewed.   Diet Order:   Diet Order            Diet NPO time specified  Diet effective now              EDUCATION NEEDS:   Not appropriate for education at this time  Skin:  Skin Assessment: Reviewed RN Assessment  Last BM:  4/15  Height:   Ht Readings from Last 1 Encounters:  01/25/19 5' 2.5" (1.588 m)    Weight:   Wt Readings from Last 1 Encounters:  01/25/19 81.6 kg    Ideal Body Weight:  51 kg  BMI:  Body mass index is 32.4 kg/m.  Estimated Nutritional Needs:   Kcal:  1650-1850  Protein:  85-95 grams  Fluid:  1.7 -1.9 L/day    Corrin Parker, MS, RD, LDN Pager # [phone] After hours/ weekend pager # [phone]

## 2019-01-31 NOTE — Progress Notes (Signed)
Subjective: CC: Abdominal pain Patient reports she had a good night. Abdominal pain continues to improve and is mild in her LLQ and suprapubic areas of her abdomen (only 1 oxycodone for pain yesterday). No N/V (did have 2 doses of zofran yesterday). NPO yesterday with ice chips. Reports urinating without difficulties. No further episodes of stool like material in her urine or pneumaturia. Reports this is clear. Reports 4-6 small, formed BM yesterday that were NB/NM. Continues to pass flatus. Mobilizing.   Objective: Vital signs in last 24 hours: Temp:  [97.8 F (36.6 C)-99.5 F (37.5 C)] 98.3 F (36.8 C) (04/17 0739) Pulse Rate:  [79-85] 82 (04/17 0739) Resp:  [15-20] 15 (04/17 0739) BP: (155-178)/(65-81) 178/81 (04/17 0739) SpO2:  [93 %-97 %] 95 % (04/17 0739) Last BM Date: 01/29/19  Intake/Output from previous day: No intake/output data recorded. Intake/Output this shift: No intake/output data recorded.  PE: Gen: Alert, NAD, pleasant, cooperative Heart: RRR Pulm:Rate andeffort normal, CTA b/l  Abd: Soft, ND,+BS, very mildTTP in LLQ and suprapubic region without r/r/g. No peritonitis. Her tenderness is improved from yesterday.  Skin: warm and dry  Lab Results:  Recent Labs    01/30/19 0535 01/31/19 0350  WBC 16.6* 13.7*  HGB 10.4* 9.4*  HCT 32.2* 30.2*  PLT 313 355   BMET Recent Labs    01/30/19 0830 01/31/19 0350  NA 141 142  K 3.0* 2.9*  CL 106 106  CO2 23 26  GLUCOSE 77 97  BUN 15 15  CREATININE 0.75 0.82  CALCIUM 8.2* 8.3*   PT/INR No results for input(s): LABPROT, INR in the last 72 hours. CMP     Component Value Date/Time   NA 142 01/31/2019 0350   K 2.9 (L) 01/31/2019 0350   CL 106 01/31/2019 0350   CO2 26 01/31/2019 0350   GLUCOSE 97 01/31/2019 0350   BUN 15 01/31/2019 0350   CREATININE 0.82 01/31/2019 0350   CALCIUM 8.3 (L) 01/31/2019 0350   PROT 5.6 (L) 01/30/2019 0830   ALBUMIN 2.3 (L) 01/30/2019 0830   AST 16 01/30/2019  0830   ALT 14 01/30/2019 0830   ALKPHOS 105 01/30/2019 0830   BILITOT 1.1 01/30/2019 0830   GFRNONAA >60 01/31/2019 0350   GFRAA >60 01/31/2019 0350   Lipase     Component Value Date/Time   LIPASE 26 01/25/2019 1623       Studies/Results: No results found.  Anti-infectives: Anti-infectives (From admission, onward)   Start     Dose/Rate Route Frequency Ordered Stop   01/26/19 1000  hydroxychloroquine (PLAQUENIL) tablet 200 mg     200 mg Oral 2 times daily 01/26/19 0041     01/26/19 0000  piperacillin-tazobactam (ZOSYN) IVPB 3.375 g  Status:  Discontinued     3.375 g 100 mL/hr over 30 Minutes Intravenous Every 6 hours 01/25/19 2138 01/25/19 2325   01/26/19 0000  piperacillin-tazobactam (ZOSYN) IVPB 3.375 g     3.375 g 12.5 mL/hr over 240 Minutes Intravenous Every 8 hours 01/25/19 2325     01/25/19 1845  piperacillin-tazobactam (ZOSYN) IVPB 3.375 g     3.375 g 100 mL/hr over 30 Minutes Intravenous  Once 01/25/19 1832 01/25/19 1927       Assessment/Plan Asthma Sjogren's syndrome   Sigmoid Diverticulitis with microperforation Colovesical fistula-seen on CT 4/11 - WBC stillup but downtrending, from 9.4 -> 16.6 -> 13.7. Afebrile. No tachycardia or hypotension. Abdominal exam improved. No peritonitis.  - No indications for urgent  or emergent surgical interventionat this time  - With elevated WBC will obtain CT w/ IV contrast (Cr 0.82) to r/o intra-abdominal abscess. Leave NPO for now until CT results. - Urology following. Appreciate their input - Mobilize, IS -we will continue to follow  FEN:NPO, IVF, K 2.9 (K being replaced in IVF, also getting Mg) VTE: SCD's,Lovenox, moblize MQ:TTCNG 04/11>> Follow up:Dr. Dema Severin   LOS: 6 days    Jillyn Ledger , [facility] 01/31/2019, 8:29 AM Pager: [phone]

## 2019-01-31 NOTE — Progress Notes (Signed)
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 CT abdomen/pelvis was concerning for diverticulitis of the sigmoid colon with the inflamed segment contiguous with the anterior superior margin of the urinary bladder indicating a colovesical fistula with associated air  in the bladder.  CT also noted intraperitoneal free air suggesting microperforation. General surgery and urology consulted and patient admitted with zosyn given.  Assessment & Plan: Principal Problem:   Diverticulitis of large intestine with perforation Active Problems:   Colovesical fistula   Asthma   Sjogren's syndrome ([place])  Acute sigmoid diverticulitis with microperforation and colovesical fistula: - Continuing zosyn. Trend CBC. - Continue analgesics, PO and IV options available prn. - Urology planning to follow up as outpatient. Would perform concomitant surgery with gen surg when indicated. - Keeping NPO pending CECT to r/o abscess. Continue IVF.  Hypokalemia:  - Increase supplementation in IVF, give empiric magnesium and recheck in AM.  Asthma: No acute exacerbation.  - Continue singulair and prn neb  Sjogren syndrome: Stable. - Continue plaquenil.  Normocytic anemia, suspected to be due to chronic disease: - Check anemia panel. No bleeding.   HTN: No BP medications PTA.  - Hydralazine IV prn  DVT prophylaxis: Lovenox ok per surgery Code Status: Full Family Communication: None at bedside Disposition Plan: TBD  Consultants:   General surgery  Urology  Procedures:   None  Antimicrobials:  Zosyn   Subjective: Had some abd cramping, but attributes that more to hunger than her previous pain. No fevers or chills,  feeling less generally ill. +BMs, some nausea without vomiting.   Objective: Vitals:   01/30/19 2225 01/31/19 0056 01/31/19 0434 01/31/19 0739  BP: (!) 169/65 (!) 157/72 (!) 155/73 (!) 178/81  Pulse: 80 80 85 82  Resp: 18 18 17 15   Temp: 99.2 F (37.3 C) 97.8 F (36.6 C) 98.6 F (37 C) 98.3 F (36.8 C)  TempSrc: Oral Oral Oral Oral  SpO2: 94% 93% 94% 95%  Weight:      Height:       No intake or output data in the 24 hours ending 01/31/19 1150 Filed Weights   01/25/19 1537  Weight: 81.6 kg   Gen: 73 y.o. female in no distress Pulm: Nonlabored breathing room air. Clear. CV: Regular rate and rhythm. No murmur, rub, or gallop. No JVD, no dependent edema. GI: Abdomen soft, LLQ tenderness without rebound or guarding, generally less tender than previous exams. Hypoactive BS. Ext: Warm, no deformities Skin: No rashes, lesions or ulcers on visualized skin. Neuro: Alert and oriented. No focal neurological deficits. Psych: Judgement and insight appear good. Mood euthymic & affect congruent. Behavior is appropriate.    Data Reviewed: I have personally reviewed following labs and imaging studies  CBC: Recent Labs  Lab 01/25/19 1623  01/27/19 0526 01/28/19 1030 01/29/19 0354 01/30/19 0535 01/31/19 0350  WBC 17.1*   < > 13.6* 9.3 9.4 16.6* 13.7*  NEUTROABS 15.2*  --  11.9*  --   --   --  10.8*  HGB 11.7*   < > 10.8* 10.2* 9.5* 10.4* 9.4*  HCT 37.1   < > 34.7* 32.0* 29.6* 32.2* 30.2*  MCV 90.3   < > 87.8 87.7 86.8 87.3 86.8  PLT 272   < >  265 271 270 313 355   < > = values in this interval not displayed.   Basic Metabolic Panel: Recent Labs  Lab 01/25/19 1623 01/26/19 0914 01/30/19 0830 01/31/19 0350  NA 137 137 141 142  K 3.4* 3.9 3.0* 2.9*  CL 102 106 106 106  CO2 23 21* 23 26  GLUCOSE 96 88 77 97  BUN 18 14 15 15   CREATININE 0.77 0.82 0.75 0.82  CALCIUM 9.0 8.6* 8.2* 8.3*   GFR: Estimated Creatinine Clearance: 61.2 mL/min (by C-G formula based on SCr of 0.82  mg/dL). Liver Function Tests: Recent Labs  Lab 01/25/19 1623 01/26/19 0914 01/30/19 0830  AST 19 14* 16  ALT 17 15 14   ALKPHOS 86 84 105  BILITOT 0.9 1.0 1.1  PROT 7.0 6.1* 5.6*  ALBUMIN 3.8 2.9* 2.3*   Recent Labs  Lab 01/25/19 1623  LIPASE 26   Urine analysis:    Component Value Date/Time   COLORURINE AMBER (A) 01/25/2019 1823   APPEARANCEUR CLOUDY (A) 01/25/2019 1823   LABSPEC 1.025 01/25/2019 1823   PHURINE 6.0 01/25/2019 1823   GLUCOSEU NEGATIVE 01/25/2019 1823   HGBUR SMALL (A) 01/25/2019 1823   BILIRUBINUR NEGATIVE 01/25/2019 1823   KETONESUR 40 (A) 01/25/2019 1823   PROTEINUR 30 (A) 01/25/2019 1823   NITRITE POSITIVE (A) 01/25/2019 1823   LEUKOCYTESUR TRACE (A) 01/25/2019 1823   Recent Results (from the past 240 hour(s))  Urine Culture     Status: Abnormal   Collection Time: 01/26/19  4:04 AM  Result Value Ref Range Status   Specimen Description URINE, CLEAN CATCH  Final   Special Requests NONE  Final   Culture (A)  Final    <10,000 COLONIES/mL INSIGNIFICANT GROWTH Performed at [facility], [address] [place], [place] [address]    Report Status 01/27/2019 FINAL  Final  Culture, blood (routine x 2)     Status: None   Collection Time: 01/26/19 10:25 AM  Result Value Ref Range Status   Specimen Description BLOOD LEFT HAND  Final   Special Requests AEROBIC BOTTLE ONLY Blood Culture adequate volume  Final   Culture   Final    NO GROWTH 5 DAYS Performed at [facility], [place] [address] [place], [place] [address]    Report Status 01/31/2019 FINAL  Final  Culture, blood (routine x 2)     Status: None   Collection Time: 01/26/19 10:28 AM  Result Value Ref Range Status   Specimen Description BLOOD RIGHT ANTECUBITAL  Final   Special Requests AEROBIC BOTTLE ONLY Blood Culture adequate volume  Final   Culture   Final    NO GROWTH 5 DAYS Performed at [facility], [place] [address] [place],  [address]    Report Status 01/31/2019  FINAL  Final      Radiology Studies: No results found.  Scheduled Meds: . enoxaparin (LOVENOX) injection  40 mg Subcutaneous Q24H  . hydroxychloroquine  200 mg Oral BID  . montelukast  10 mg Oral QHS  . ondansetron (ZOFRAN) IV  4 mg Intravenous Once  . pantoprazole (PROTONIX) IV  40 mg Intravenous QHS   Continuous Infusions: . sodium chloride Stopped (01/28/19 1529)  . dextrose 5 % and 0.45 % NaCl with KCl 40 mEq/L 100 mL/hr at 01/31/19 4196  . piperacillin-tazobactam (ZOSYN)  IV 3.375 g (01/31/19 0835)     LOS: 6 days   Time spent: 25 minutes.  Patrecia Pour, MD [facility]  [email or web address] Password [facility] 01/31/2019, 11:50 AM

## 2019-01-31 NOTE — Care Management Important Message (Signed)
Important Message  Patient Details  Name: Denise Payne MRN: [id number] Date of Birth: 1946/01/15   Medicare Important Message Given:  Yes    [name] [address] 01/31/2019, 3:10 PM

## 2019-01-31 NOTE — Progress Notes (Signed)
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 Cr 0.8. No large fluid collections / abscesses. Placed on IV Zosyn. Admits to pneumaturia that comes and goes x months.   2 - Bacteruria - bacteruria on UA as expected in setting of CV fistula. UCX 4/12 scant growth.  PMH c/w Sjogren's / plaquenill, obesity, benign hyst (pfannenstiel). She is retired Therapist, sports. Her PCP is  Dr. Clayton Bibles with [facility] [place].   Today "Denise Payne" is still improving clinically. Much less abd pain, now more LLQ than diffuse. Less malaise as well. Several BM's yesterday.    Objective: Vital signs in last 24 hours: Temp:  [97.8 F (36.6 C)-99.5 F (37.5 C)] 98.3 F (36.8 C) (04/17 0739) Pulse Rate:  [79-85] 82 (04/17 0739) Resp:  [15-20] 15 (04/17 0739) BP: (155-178)/(65-81) 178/81 (04/17 0739) SpO2:  [93 %-97 %] 95 % (04/17 0739) Last BM Date: 01/29/19  Intake/Output from previous day: No intake/output data recorded. Intake/Output this shift: No intake/output data recorded.   Physical Exam  Constitutional: She appears well-developed.  Minimal malaise, improved. Very pleasant.   HENT:  Head: Normocephalic.  Eyes: Pupils are equal, round, and reactive to light.  Cardiovascular: Normal rate.  Respiratory: Effort normal.  GI:  Obese with much improved now focal LLQ tenderness w/o rebound. 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   Genitourinary:    Genitourinary Comments: No CVAT. NO catheters.    Musculoskeletal: Normal range of motion.  Neurological: She is alert.  Skin: Skin is warm.  Psychiatric: She has a normal mood and affect.   Lab Results:  Recent Labs    01/30/19 0535 01/31/19 0350  WBC 16.6* 13.7*  HGB 10.4* 9.4*  HCT 32.2* 30.2*  PLT 313 355   BMET Recent Labs    01/30/19 0830 01/31/19 0350  NA 141 142  K 3.0* 2.9*  CL 106 106  CO2 23 26  GLUCOSE  77 97  BUN 15 15  CREATININE 0.75 0.82  CALCIUM 8.2* 8.3*   PT/INR No results for input(s): LABPROT, INR in the last 72 hours. ABG No results for input(s): PHART, HCO3 in the last 72 hours.  Invalid input(s): PCO2, PO2  Studies/Results: No results found.  Anti-infectives: Anti-infectives (From admission, onward)   Start     Dose/Rate Route Frequency Ordered Stop   01/26/19 1000  hydroxychloroquine (PLAQUENIL) tablet 200 mg     200 mg Oral 2 times daily 01/26/19 0041     01/26/19 0000  piperacillin-tazobactam (ZOSYN) IVPB 3.375 g  Status:  Discontinued     3.375 g 100 mL/hr over 30 Minutes Intravenous Every 6 hours 01/25/19 2138 01/25/19 2325   01/26/19 0000  piperacillin-tazobactam (ZOSYN) IVPB 3.375 g     3.375 g 12.5 mL/hr over 240 Minutes Intravenous Every 8 hours 01/25/19 2325     01/25/19 1845  piperacillin-tazobactam (ZOSYN) IVPB 3.375 g     3.375 g 100 mL/hr over 30 Minutes Intravenous  Once 01/25/19 1832 01/25/19 1927      Assessment/Plan:  Continue medical managent of acute diverticulitis. Discussed again that if fistula persists beyond several weeks then consider elective surgical repair in combined gen surg / urology approach. Reinforced expectations of abd pain and that some level is likely to persist as diverticulitis is resolving, but that resmed bowel function, resolution of elevated WBC encouraging clinical signs of improvement.   Please call me  directly with questions anytime.   We will arrange for GU followup in about a month.  We will follow her PRN over the weekend unless urgent developments.   Alexis Frock 01/31/2019

## 2019-02-01 LAB — RETICULOCYTES
Immature Retic Fract: 21.4 % — ABNORMAL HIGH (ref 2.3–15.9)
RBC.: 3.53 MIL/uL — ABNORMAL LOW (ref 3.87–5.11)
Retic Count, Absolute: 40.6 10*3/uL (ref 19.0–186.0)
Retic Ct Pct: 1.2 % (ref 0.4–3.1)

## 2019-02-01 LAB — CBC
HCT: 30.9 % — ABNORMAL LOW (ref 36.0–46.0)
Hemoglobin: 9.7 g/dL — ABNORMAL LOW (ref 12.0–15.0)
MCH: 27.5 pg (ref 26.0–34.0)
MCHC: 31.4 g/dL (ref 30.0–36.0)
MCV: 87.5 fL (ref 80.0–100.0)
Platelets: 314 10*3/uL (ref 150–400)
RBC: 3.53 MIL/uL — ABNORMAL LOW (ref 3.87–5.11)
RDW: 13.8 % (ref 11.5–15.5)
WBC: 13.3 10*3/uL — ABNORMAL HIGH (ref 4.0–10.5)
nRBC: 0 % (ref 0.0–0.2)

## 2019-02-01 LAB — BASIC METABOLIC PANEL
Anion gap: 13 (ref 5–15)
BUN: 10 mg/dL (ref 8–23)
CO2: 24 mmol/L (ref 22–32)
Calcium: 8.3 mg/dL — ABNORMAL LOW (ref 8.9–10.3)
Chloride: 103 mmol/L (ref 98–111)
Creatinine, Ser: 0.61 mg/dL (ref 0.44–1.00)
GFR calc Af Amer: >60 mL/min (ref >60)
GFR calc non Af Amer: >60 mL/min (ref >60)
Glucose, Bld: 103 mg/dL — ABNORMAL HIGH (ref 70–99)
Potassium: 3.3 mmol/L — ABNORMAL LOW (ref 3.5–5.1)
Sodium: 140 mmol/L (ref 135–145)

## 2019-02-01 LAB — FOLATE: Folate: 16.6 ng/mL (ref >5.9)

## 2019-02-01 LAB — FERRITIN: Ferritin: 152 ng/mL (ref 11–307)

## 2019-02-01 LAB — MAGNESIUM: Magnesium: 1.9 mg/dL (ref 1.7–2.4)

## 2019-02-01 LAB — IRON AND TIBC
Iron: 14 ug/dL — ABNORMAL LOW (ref 28–170)
Saturation Ratios: 7 % — ABNORMAL LOW (ref 10.4–31.8)
TIBC: 193 ug/dL — ABNORMAL LOW (ref 250–450)
UIBC: 179 ug/dL

## 2019-02-01 LAB — VITAMIN B12: Vitamin B-12: 3361 pg/mL — ABNORMAL HIGH (ref 180–914)

## 2019-02-01 MED ORDER — BOOST / RESOURCE BREEZE PO LIQD CUSTOM
1.0000 | Freq: Three times a day (TID) | ORAL | Status: DC
  Administered 2019-02-01 – 2019-02-06 (×10): 1 via ORAL
  Filled 2019-02-01: qty 1

## 2019-02-01 MED ORDER — POTASSIUM CHLORIDE CRYS ER 20 MEQ PO TBCR
40.0000 meq | EXTENDED_RELEASE_TABLET | Freq: Once | ORAL | Status: DC
  Filled 2019-02-01: qty 2

## 2019-02-01 MED ORDER — POTASSIUM CHLORIDE 20 MEQ PO PACK
40.0000 meq | PACK | Freq: Once | ORAL | Status: AC
  Administered 2019-02-01: 17:00:00 40 meq via ORAL
  Filled 2019-02-01: qty 2

## 2019-02-01 MED ORDER — GUAIFENESIN ER 600 MG PO TB12
600.0000 mg | ORAL_TABLET | Freq: Two times a day (BID) | ORAL | Status: DC
  Administered 2019-02-01 – 2019-02-08 (×15): 600 mg via ORAL
  Filled 2019-02-01 (×15): qty 1

## 2019-02-01 NOTE — Progress Notes (Signed)
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 Abdominal exam improved. No peritonitis.  - No indications for urgent or emergent surgical interventionat this time  - CT 04/17 showed multiple small abscess   - Urology following. Appreciate their input - Mobilize, IS -we will continue to follow  FEN: FLD VTE: SCD's,Lovenox, moblize XB:MWUXL 04/11>> Follow up:Dr. Dema Severin  Dispo: advance diet to FLD, pt is improving clinically    LOS: 7 days    Subjective: CC: abdominal pain improved  Complaining of PND nut mucinex is helping. No nausea, vomiting, fever or chills overnight. Tolerating clears. Having flatus. She feels better today than yesterday. She wants more to eat.   Objective: Vital signs in last 24 hours: Temp:  [97.7 F (36.5 C)-99.3 F (37.4 C)] 98.8 F (37.1 C) (04/18 0819) Pulse Rate:  [77-95] 82 (04/18 0819) Resp:  [17-22] 18 (04/18 0819) BP: (137-174)/(71-85) 137/78 (04/18 0819) SpO2:  [93 %-96 %] 96 % (04/18 0819) Last BM Date: 01/31/19  Intake/Output from previous day: 04/17 0701 - 04/18 0700 In: 826.2 [P.O.:240; I.V.:155.6; IV Piggyback:430.7] Out: -  Intake/Output this shift: No intake/output data recorded.  PE: Gen:  Alert, NAD, pleasant, cooperative Pulm:  Rate and effort normal Abd: Soft, ND, very mild TTP in lower abdomen without guarding Skin: no rashes noted, warm and dry   Anti-infectives: Anti-infectives (From admission, onward)   Start     Dose/Rate Route Frequency Ordered Stop   01/26/19 1000  hydroxychloroquine (PLAQUENIL) tablet 200 mg     200 mg Oral 2 times daily 01/26/19 0041     01/26/19 0000  piperacillin-tazobactam (ZOSYN) IVPB 3.375 g  Status:  Discontinued     3.375 g 100 mL/hr over 30 Minutes Intravenous Every 6 hours 01/25/19 2138 01/25/19  2325   01/26/19 0000  piperacillin-tazobactam (ZOSYN) IVPB 3.375 g     3.375 g 12.5 mL/hr over 240 Minutes Intravenous Every 8 hours 01/25/19 2325     01/25/19 1845  piperacillin-tazobactam (ZOSYN) IVPB 3.375 g     3.375 g 100 mL/hr over 30 Minutes Intravenous  Once 01/25/19 1832 01/25/19 1927      Lab Results:  Recent Labs    01/31/19 0350 02/01/19 0429  WBC 13.7* 13.3*  HGB 9.4* 9.7*  HCT 30.2* 30.9*  PLT 355 314   BMET Recent Labs    01/31/19 0350 02/01/19 0429  NA 142 140  K 2.9* 3.3*  CL 106 103  CO2 26 24  GLUCOSE 97 103*  BUN 15 10  CREATININE 0.82 0.61  CALCIUM 8.3* 8.3*   PT/INR No results for input(s): LABPROT, INR in the last 72 hours. CMP     Component Value Date/Time   NA 140 02/01/2019 0429   K 3.3 (L) 02/01/2019 0429   CL 103 02/01/2019 0429   CO2 24 02/01/2019 0429   GLUCOSE 103 (H) 02/01/2019 0429   BUN 10 02/01/2019 0429   CREATININE 0.61 02/01/2019 0429   CALCIUM 8.3 (L) 02/01/2019 0429   PROT 5.6 (L) 01/30/2019 0830   ALBUMIN 2.3 (L) 01/30/2019 0830   AST 16 01/30/2019 0830   ALT 14 01/30/2019 0830   ALKPHOS 105 01/30/2019 0830   BILITOT 1.1 01/30/2019 0830   GFRNONAA >60 02/01/2019 0429   GFRAA >60 02/01/2019 0429   Lipase     Component Value Date/Time   LIPASE 26 01/25/2019 1623  Studies/Results: Ct Abdomen Pelvis W Contrast  Result Date: 01/31/2019 CLINICAL DATA:  Follow-up diverticulitis EXAM: CT ABDOMEN AND PELVIS WITH CONTRAST TECHNIQUE: Multidetector CT imaging of the abdomen and pelvis was performed using the standard protocol following bolus administration of intravenous contrast. CONTRAST:  122mL OMNIPAQUE IOHEXOL 300 MG/ML  SOLN COMPARISON:  01/25/2019 FINDINGS: Lower chest: Moderate right right and small left pleural effusion with dependent atelectasis. Interlobular septal thickening at the lung bases likely represents a small amount of edema. Hepatobiliary: Cholelithiasis.  Liver is unremarkable. Pancreas:  Unremarkable Spleen: Calcified granuloma.  Otherwise unremarkable. Adrenals/Urinary Tract: Kidneys are within normal limits. Adrenal glands are unremarkable. Bladder contains a small amount of gas. There is a 1.7 x 1.5 cm fluid and gas collection at the dome of the bladder of presenting either a large diverticulum or possibly a small abscess. These findings raise the possibility of fistula between sigmoid colon and bladder. Stomach/Bowel: There are inflammatory changes of the sigmoid colon adjacent to the dome of the bladder. This is characterized by wall thickening and stranding in the adjacent fat. Diverticulosis of the sigmoid and descending colon are noted. There is a gas and fluid collection inseparable from the sigmoid colon and dome of the bladder representing either a large inflamed diverticulum or possibly a small abscess. This may indicate a fistula. Distended small bowel loops with air-fluid levels are present in the upper abdomen. Distal small bowel loops are decompressed. The transition point is in the inflammatory process within the pelvis. Vascular/Lymphatic: Atherosclerotic vascular calcifications. No abnormal retroperitoneal adenopathy. Several prominent but non pathologically enlarged mesenteric nodes are noted and are likely inflammatory in nature. Reproductive: Uterus is absent. Other: There is stranding and inflammatory changes within the anterior pelvic fat adjacent to the area of diverticulitis. Multiple very small gas and fluid filled abscesses are present in the lower abdomen and upper pelvis. The largest measures 3.6 x 1.5 cm. A loop of bowel is seen anterior to this fluid collection. Small amount of free fluid about the liver. Musculoskeletal: No vertebral compression deformity. IMPRESSION: Findings are again consistent with acute sigmoid diverticulitis. There are findings worrisome for a fistula between the sigmoid colon and bladder as described above. There are gas and fluid filled  collections in the lower abdomen and upper pelvis compatible with multiple small abscesses. The largest is 3.6 x 1.5 cm. It is posterior to a loop of bowel and safe access for percutaneous drainage is not possible. Partial small bowel obstruction pattern. The transition point is in the pelvis at the site of the inflammatory process. Cholelithiasis. Bilateral pleural effusions. Electronically Signed   By: Marybelle Killings M.D.   On: 01/31/2019 11:51      Kalman Drape , [facility] 02/01/2019, 8:47 AM  Pager: [phone] Mon-Wed, Friday 7:00am-4:30pm Thurs 7am-11:30am  Consults: [phone]

## 2019-02-01 NOTE — Progress Notes (Signed)
Patient Demographics:    Denise Payne, is a 73 y.o. female, DOB - 1946/03/27, KGU:[id number]  Admit date - 01/25/2019   Admitting Physician Vianne Bulls, MD  Outpatient Primary MD for the patient is Leeroy Cha, MD  LOS - 7   Chief Complaint  Patient presents with   Abdominal Pain        Subjective:    Denise Payne today has no fevers, no emesis,  No chest pain, abdomen is less painful had loose stool, trying to take in full liquid diet  Assessment  & Plan :    Principal Problem:   Diverticulitis of large intestine with perforation Active Problems:   Colovesical fistula   Asthma   Sjogren's syndrome ([place])  Brief Narrative: Denise Payne is a 73 y.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 CT also noted intraperitoneal free air suggesting microperforation. General surgery and urology consulted and patient admitted with zosyn given.   A/P 1)Acute sigmoid diverticulitis with microperforation and colovesical fistula: -WBC is down to 13.3,  -Per surgical team hold off on discharge at this time until patient is tolerating full liquid diet - Urology planning to follow up as outpatient. Would perform concomitant surgery with gen surg when indicated.  2)FEN/Hypokalemia--decrease IV fluid as oral intake improves replace potassium p.o. and orally  3)Asthma: No acute exacerbation.  - Continue singulair and prn neb  4)Sjogren syndrome: Stable, - Continue plaquenil.  5)Normocytic anemia, suspected to be due to  chronic disease and Plaquenil use, anemia work-up reveals some degree of iron deficiency, ferritin is normal, serum iron is low at 14, iron saturation is low at 7, with a low TIBC of 628, B15 and folic acid are not low.  No evidence of ongoing blood loss or bleeding  6)H/o HTN: No BP medications PTA.  - Hydralazine IV prn  DVT prophylaxis: Lovenox ok per surgery Code Status: Full Family Communication: None at bedside Disposition Plan:  Most likely discharge home once tolerating oral intake well if no fevers and leukocytosis continues to improve  Consultants:   General surgery  Urology  Procedures:   None  Antimicrobials:  Zosyn   Disposition/Need for in-Hospital Stay- patient unable to be discharged at this time due to general surgery recommends against discharge home at this time until leukocytosis improves further and patient tolerates oral intake better  Code Status : Full  Family Communication:  na  Disposition Plan  : Home when oral intake is more reliable  DVT Prophylaxis  :  Lovenox   Lab Results  Component Value Date   PLT 314 02/01/2019    Inpatient Medications  Scheduled Meds:  enoxaparin (LOVENOX) injection  40 mg Subcutaneous Q24H   feeding supplement  1 Container Oral TID BM   guaiFENesin  600 mg Oral BID   hydroxychloroquine  200 mg Oral BID   montelukast  10 mg Oral QHS   ondansetron (ZOFRAN) IV  4 mg  Intravenous Once   pantoprazole (PROTONIX) IV  40 mg Intravenous QHS   potassium chloride  40 mEq Oral Once   Continuous Infusions:  sodium chloride Stopped (02/01/19 0000)   dextrose 5 % and 0.45 % NaCl with KCl 40 mEq/L 80 mL/hr at 02/01/19 1416   piperacillin-tazobactam (ZOSYN)  IV 3.375 g (02/01/19 1037)   PRN Meds:.sodium chloride, acetaminophen **OR** acetaminophen, albuterol, hydrALAZINE, morphine injection, ondansetron (ZOFRAN) IV, oxyCODONE    Anti-infectives (From admission, onward)   Start     Dose/Rate Route Frequency  Ordered Stop   01/26/19 1000  hydroxychloroquine (PLAQUENIL) tablet 200 mg     200 mg Oral 2 times daily 01/26/19 0041     01/26/19 0000  piperacillin-tazobactam (ZOSYN) IVPB 3.375 g  Status:  Discontinued     3.375 g 100 mL/hr over 30 Minutes Intravenous Every 6 hours 01/25/19 2138 01/25/19 2325   01/26/19 0000  piperacillin-tazobactam (ZOSYN) IVPB 3.375 g     3.375 g 12.5 mL/hr over 240 Minutes Intravenous Every 8 hours 01/25/19 2325     01/25/19 1845  piperacillin-tazobactam (ZOSYN) IVPB 3.375 g     3.375 g 100 mL/hr over 30 Minutes Intravenous  Once 01/25/19 1832 01/25/19 1927        Objective:   Vitals:   02/01/19 0010 02/01/19 0336 02/01/19 0819 02/01/19 1235  BP: (!) 156/71 (!) 158/85 137/78 (!) 161/79  Pulse: 88 95 82 86  Resp: 18 17 18 18   Temp: 98.5 F (36.9 C) 98.8 F (37.1 C) 98.8 F (37.1 C) 99.1 F (37.3 C)  TempSrc: Oral Oral Oral Oral  SpO2: 93% 94% 96% 97%  Weight:      Height:        Wt Readings from Last 3 Encounters:  01/25/19 81.6 kg  09/28/18 81.6 kg     Intake/Output Summary (Last 24 hours) at 02/01/2019 1509 Last data filed at 02/01/2019 0500 Gross per 24 hour  Intake 826.22 ml  Output --  Net 826.22 ml     Physical Exam Patient is examined daily including today on 02/01/19 , exams remain the same as of yesterday except that has changed   Gen:- Awake Alert,  In no apparent distress  HEENT:- [place].AT, No sclera icterus Neck-Supple Neck,No JVD,.  Lungs-  CTAB , fair symmetrical air movement CV- S1, S2 normal, regular  Abd-  +ve B.Sounds, Abd Soft, lower abdominal tenderness is improved especially in the left lower quadrant    Extremity/Skin:- No  edema, pedal pulses present  Psych-affect is appropriate, oriented x3 Neuro-no new focal deficits, no tremors   Data Review:   Micro Results Recent Results (from the past 240 hour(s))  Urine Culture     Status: Abnormal   Collection Time: 01/26/19  4:04 AM  Result Value Ref Range Status    Specimen Description URINE, CLEAN CATCH  Final   Special Requests NONE  Final   Culture (A)  Final    <10,000 COLONIES/mL INSIGNIFICANT GROWTH Performed at [facility], [address] [place], [place] [address]    Report Status 01/27/2019 FINAL  Final  Culture, blood (routine x 2)     Status: None   Collection Time: 01/26/19 10:25 AM  Result Value Ref Range Status   Specimen Description BLOOD LEFT HAND  Final   Special Requests AEROBIC BOTTLE ONLY Blood Culture adequate volume  Final   Culture   Final    NO GROWTH 5 DAYS Performed at [facility], [place]  [address], [place], [place] [address]    Report Status 01/31/2019 FINAL  Final  Culture, blood (routine x 2)     Status: None   Collection Time: 01/26/19 10:28 AM  Result Value Ref Range Status   Specimen Description BLOOD RIGHT ANTECUBITAL  Final   Special Requests AEROBIC BOTTLE ONLY Blood Culture adequate volume  Final   Culture   Final    NO GROWTH 5 DAYS Performed at [facility], [place] [address] [place], [place] [address]    Report Status 01/31/2019 FINAL  Final    Radiology Reports Ct Abdomen Pelvis W Contrast  Result Date: 01/31/2019 CLINICAL DATA:  Follow-up diverticulitis EXAM: CT ABDOMEN AND PELVIS WITH CONTRAST TECHNIQUE: Multidetector CT imaging of the abdomen and pelvis was performed using the standard protocol following bolus administration of intravenous contrast. CONTRAST:  146mL OMNIPAQUE IOHEXOL 300 MG/ML  SOLN COMPARISON:  01/25/2019 FINDINGS: Lower chest: Moderate right right and small left pleural effusion with dependent atelectasis. Interlobular septal thickening at the lung bases likely represents a small amount of edema. Hepatobiliary: Cholelithiasis.  Liver is unremarkable. Pancreas: Unremarkable Spleen: Calcified granuloma.  Otherwise unremarkable. Adrenals/Urinary Tract: Kidneys are within normal limits. Adrenal glands are unremarkable. Bladder contains a small amount of gas. There is a  1.7 x 1.5 cm fluid and gas collection at the dome of the bladder of presenting either a large diverticulum or possibly a small abscess. These findings raise the possibility of fistula between sigmoid colon and bladder. Stomach/Bowel: There are inflammatory changes of the sigmoid colon adjacent to the dome of the bladder. This is characterized by wall thickening and stranding in the adjacent fat. Diverticulosis of the sigmoid and descending colon are noted. There is a gas and fluid collection inseparable from the sigmoid colon and dome of the bladder representing either a large inflamed diverticulum or possibly a small abscess. This may indicate a fistula. Distended small bowel loops with air-fluid levels are present in the upper abdomen. Distal small bowel loops are decompressed. The transition point is in the inflammatory process within the pelvis. Vascular/Lymphatic: Atherosclerotic vascular calcifications. No abnormal retroperitoneal adenopathy. Several prominent but non pathologically enlarged mesenteric nodes are noted and are likely inflammatory in nature. Reproductive: Uterus is absent. Other: There is stranding and inflammatory changes within the anterior pelvic fat adjacent to the area of diverticulitis. Multiple very small gas and fluid filled abscesses are present in the lower abdomen and upper pelvis. The largest measures 3.6 x 1.5 cm. A loop of bowel is seen anterior to this fluid collection. Small amount of free fluid about the liver. Musculoskeletal: No vertebral compression deformity. IMPRESSION: Findings are again consistent with acute sigmoid diverticulitis. There are findings worrisome for a fistula between the sigmoid colon and bladder as described above. There are gas and fluid filled collections in the lower abdomen and upper pelvis compatible with multiple small abscesses. The largest is 3.6 x 1.5 cm. It is posterior to a loop of bowel and safe access for percutaneous drainage is not possible.  Partial small bowel obstruction pattern. The transition point is in the pelvis at the site of the inflammatory process. Cholelithiasis. Bilateral pleural effusions. Electronically Signed   By: Marybelle Killings M.D.   On: 01/31/2019 11:51   Ct Abdomen Pelvis W Contrast  Result Date: 01/25/2019 CLINICAL DATA:  Diffuse abdominal pain. History of GERD, nausea. EXAM: CT ABDOMEN AND PELVIS WITH CONTRAST TECHNIQUE: Multidetector CT imaging of the abdomen and pelvis was performed using the standard protocol following  bolus administration of intravenous contrast. CONTRAST:  141mL OMNIPAQUE IOHEXOL 300 MG/ML  SOLN COMPARISON:  None. FINDINGS: Lower chest: Mild bibasilar scarring/atelectasis. Hepatobiliary: Numerous stones within the a otherwise unremarkable gallbladder. No focal liver abnormality. No significant bile duct dilatation seen. Pancreas: Unremarkable. No pancreatic ductal dilatation or surrounding inflammatory changes. Spleen: Normal in size without focal abnormality. Adrenals/Urinary Tract: Adrenal glands appear normal. Kidneys are unremarkable without mass, stone or hydronephrosis. No perinephric fluid. Air within the bladder. Stomach/Bowel: Extensive diverticulosis throughout the colon. Inflammation/fluid stranding is seen about the sigmoid colon indicating acute diverticulitis. The inflamed segment of bowel is contiguous with the anterior-superior margin of the bladder concerning for fistula. No dilated large or small bowel loops. Stomach is unremarkable, decompressed. Appendix is normal. Vascular/Lymphatic: Aortic atherosclerosis. No acute appearing vascular abnormality. Numerous small lymph nodes within the lower mesentery, likely reactive in nature. Reproductive: Presumed hysterectomy. No adnexal mass. Other: Small amount of free intraperitoneal air within the central abdomen and upper abdomen. No abscess collection seen. Musculoskeletal: No acute or suspicious osseous finding. IMPRESSION: 1. Diverticulitis  of the sigmoid colon. The inflamed segment of sigmoid colon is contiguous with the anterior-superior margin of the bladder indicating colovesical fistula, with associated air in the bladder. No circumscribed abscess collection identified. 2. Free intraperitoneal air within the abdomen and pelvis indicating associated bowel perforation, likely micro perforation given the small amount of free air. 3. Cholelithiasis without evidence of acute cholecystitis. These results were called by telephone at the time of interpretation on 01/25/2019 at 6:25 pm to Dr. Jola Schmidt , who verbally acknowledged these results. Aortic Atherosclerosis (ICD10-I70.0). Electronically Signed   By: Franki Cabot M.D.   On: 01/25/2019 18:27     CBC Recent Labs  Lab 01/25/19 1623  01/27/19 0526 01/28/19 1030 01/29/19 0354 01/30/19 0535 01/31/19 0350 02/01/19 0429  WBC 17.1*   < > 13.6* 9.3 9.4 16.6* 13.7* 13.3*  HGB 11.7*   < > 10.8* 10.2* 9.5* 10.4* 9.4* 9.7*  HCT 37.1   < > 34.7* 32.0* 29.6* 32.2* 30.2* 30.9*  PLT 272   < > 265 271 270 313 355 314  MCV 90.3   < > 87.8 87.7 86.8 87.3 86.8 87.5  MCH 28.5   < > 27.3 27.9 27.9 28.2 27.0 27.5  MCHC 31.5   < > 31.1 31.9 32.1 32.3 31.1 31.4  RDW 12.9   < > 12.9 13.0 13.3 13.4 13.4 13.8  LYMPHSABS 1.1  --  0.7  --   --   --  1.5  --   MONOABS 0.7  --  0.8  --   --   --  0.8  --   EOSABS 0.0  --  0.1  --   --   --  0.2  --   BASOSABS 0.0  --  0.0  --   --   --  0.1  --    < > = values in this interval not displayed.    Chemistries  Recent Labs  Lab 01/25/19 1623 01/26/19 0914 01/30/19 0830 01/31/19 0350 02/01/19 0429  NA 137 137 141 142 140  K 3.4* 3.9 3.0* 2.9* 3.3*  CL 102 106 106 106 103  CO2 23 21* 23 26 24   GLUCOSE 96 88 77 97 103*  BUN 18 14 15 15 10   CREATININE 0.77 0.82 0.75 0.82 0.61  CALCIUM 9.0 8.6* 8.2* 8.3* 8.3*  MG  --   --   --   --  1.9  AST  19 14* 16  --   --   ALT 17 15 14   --   --   ALKPHOS 86 84 105  --   --   BILITOT 0.9 1.0 1.1  --    --    ------------------------------------------------------------------------------------------------------------------ No results for input(s): CHOL, HDL, LDLCALC, TRIG, CHOLHDL, LDLDIRECT in the last 72 hours.  No results found for: HGBA1C ------------------------------------------------------------------------------------------------------------------ No results for input(s): TSH, T4TOTAL, T3FREE, THYROIDAB in the last 72 hours.  Invalid input(s): FREET3 ------------------------------------------------------------------------------------------------------------------ Recent Labs    02/01/19 0429  VITAMINB12 3,361*  FOLATE 16.6  FERRITIN 152  TIBC 193*  IRON 14*  RETICCTPCT 1.2    Coagulation profile No results for input(s): INR, PROTIME in the last 168 hours.  No results for input(s): DDIMER in the last 72 hours.  Cardiac Enzymes No results for input(s): CKMB, TROPONINI, MYOGLOBIN in the last 168 hours.  Invalid input(s): CK ------------------------------------------------------------------------------------------------------------------ No results found for: BNP   Roxan Hockey M.D on 02/01/2019 at 3:09 PM  Go to [email or web address] - for contact info  [facility] - Office  [phone]

## 2019-02-02 LAB — CBC
HCT: 31.9 % — ABNORMAL LOW (ref 36.0–46.0)
Hemoglobin: 9.8 g/dL — ABNORMAL LOW (ref 12.0–15.0)
MCH: 26.7 pg (ref 26.0–34.0)
MCHC: 30.7 g/dL (ref 30.0–36.0)
MCV: 86.9 fL (ref 80.0–100.0)
Platelets: 378 10*3/uL (ref 150–400)
RBC: 3.67 MIL/uL — ABNORMAL LOW (ref 3.87–5.11)
RDW: 13.6 % (ref 11.5–15.5)
WBC: 14.6 10*3/uL — ABNORMAL HIGH (ref 4.0–10.5)
nRBC: 0 % (ref 0.0–0.2)

## 2019-02-02 LAB — BASIC METABOLIC PANEL
Anion gap: 10 (ref 5–15)
BUN: 8 mg/dL (ref 8–23)
CO2: 22 mmol/L (ref 22–32)
Calcium: 8.7 mg/dL — ABNORMAL LOW (ref 8.9–10.3)
Chloride: 104 mmol/L (ref 98–111)
Creatinine, Ser: 0.65 mg/dL (ref 0.44–1.00)
GFR calc Af Amer: >60 mL/min (ref >60)
GFR calc non Af Amer: >60 mL/min (ref >60)
Glucose, Bld: 101 mg/dL — ABNORMAL HIGH (ref 70–99)
Potassium: 3.8 mmol/L (ref 3.5–5.1)
Sodium: 136 mmol/L (ref 135–145)

## 2019-02-02 LAB — C DIFFICILE QUICK SCREEN W PCR REFLEX
C Diff antigen: NEGATIVE
C Diff toxin: NEGATIVE

## 2019-02-02 LAB — C DIFFICILE QUICK SCREEN W PCR REFLEX??: C Diff interpretation: NOT DETECTED

## 2019-02-02 NOTE — Progress Notes (Signed)
[facility]/Trauma Progress Note      Assessment/Plan Asthma Sjogren's syndrome   Sigmoid Diverticulitis with microperforation Colovesical fistula-seen on CT 4/11  - No indications for urgent or emergent surgical interventionat this time - CT 04/17 showed multiple small abscess   - Urology following. Appreciate their input - Mobilize, IS  FEN: FLD VTE: SCD's,Lovenox, moblize FT:DDUKG 04/11>>    WBC slightly up from yesterday to 14.6 Follow up:Dr. Dema Severin  Dispo: Pain a little worse today than yesterday and WBC up to 14.6. If pt does not improve over the next few days then she will need surgery. C diff pending.  We will continue to follow    LOS: 8 days    Subjective: CC: diarrhea  Pt states diarrhea started yesterday after potassium replacement. She was having cramping pain with bowel movements. She denies vomiting. She states she gets winded when the cramps occur. She states the diarrhea did not wake her last night. She has had 2 small episodes so far today. No blood in her stool. She states it is brown and a little watery.   Objective: Vital signs in last 24 hours: Temp:  [98.4 F (36.9 C)-99.1 F (37.3 C)] 98.6 F (37 C) (04/19 0902) Pulse Rate:  [82-93] 90 (04/19 0902) Resp:  [16-20] 19 (04/19 0902) BP: (118-169)/(65-92) 118/80 (04/19 0902) SpO2:  [92 %-99 %] 99 % (04/19 0902) Last BM Date: 02/01/19  Intake/Output from previous day: 04/18 0701 - 04/19 0700 In: 3996 [P.O.:360; I.V.:3497.7; IV Piggyback:138.3] Out: -  Intake/Output this shift: No intake/output data recorded.  PE: Gen:  Alert, NAD, pleasant, cooperative Pulm:  Rate and effort normal Abd: Soft, ND, very mild TTP in lower abdomen, guarding in the suprapubic region, no peritonitis  Skin: no rashes noted, warm and dry  Anti-infectives: Anti-infectives (From admission, onward)   Start     Dose/Rate Route Frequency Ordered Stop   01/26/19 1000  hydroxychloroquine (PLAQUENIL)  tablet 200 mg     200 mg Oral 2 times daily 01/26/19 0041     01/26/19 0000  piperacillin-tazobactam (ZOSYN) IVPB 3.375 g  Status:  Discontinued     3.375 g 100 mL/hr over 30 Minutes Intravenous Every 6 hours 01/25/19 2138 01/25/19 2325   01/26/19 0000  piperacillin-tazobactam (ZOSYN) IVPB 3.375 g     3.375 g 12.5 mL/hr over 240 Minutes Intravenous Every 8 hours 01/25/19 2325     01/25/19 1845  piperacillin-tazobactam (ZOSYN) IVPB 3.375 g     3.375 g 100 mL/hr over 30 Minutes Intravenous  Once 01/25/19 1832 01/25/19 1927      Lab Results:  Recent Labs    01/31/19 0350 02/01/19 0429  WBC 13.7* 13.3*  HGB 9.4* 9.7*  HCT 30.2* 30.9*  PLT 355 314   BMET Recent Labs    01/31/19 0350 02/01/19 0429  NA 142 140  K 2.9* 3.3*  CL 106 103  CO2 26 24  GLUCOSE 97 103*  BUN 15 10  CREATININE 0.82 0.61  CALCIUM 8.3* 8.3*   PT/INR No results for input(s): LABPROT, INR in the last 72 hours. CMP     Component Value Date/Time   NA 140 02/01/2019 0429   K 3.3 (L) 02/01/2019 0429   CL 103 02/01/2019 0429   CO2 24 02/01/2019 0429   GLUCOSE 103 (H) 02/01/2019 0429   BUN 10 02/01/2019 0429   CREATININE 0.61 02/01/2019 0429   CALCIUM 8.3 (L) 02/01/2019 0429   PROT 5.6 (L) 01/30/2019 0830   ALBUMIN  2.3 (L) 01/30/2019 0830   AST 16 01/30/2019 0830   ALT 14 01/30/2019 0830   ALKPHOS 105 01/30/2019 0830   BILITOT 1.1 01/30/2019 0830   GFRNONAA >60 02/01/2019 0429   GFRAA >60 02/01/2019 0429   Lipase     Component Value Date/Time   LIPASE 26 01/25/2019 1623    Studies/Results: Ct Abdomen Pelvis W Contrast  Result Date: 01/31/2019 CLINICAL DATA:  Follow-up diverticulitis EXAM: CT ABDOMEN AND PELVIS WITH CONTRAST TECHNIQUE: Multidetector CT imaging of the abdomen and pelvis was performed using the standard protocol following bolus administration of intravenous contrast. CONTRAST:  141mL OMNIPAQUE IOHEXOL 300 MG/ML  SOLN COMPARISON:  01/25/2019 FINDINGS: Lower chest: Moderate  right right and small left pleural effusion with dependent atelectasis. Interlobular septal thickening at the lung bases likely represents a small amount of edema. Hepatobiliary: Cholelithiasis.  Liver is unremarkable. Pancreas: Unremarkable Spleen: Calcified granuloma.  Otherwise unremarkable. Adrenals/Urinary Tract: Kidneys are within normal limits. Adrenal glands are unremarkable. Bladder contains a small amount of gas. There is a 1.7 x 1.5 cm fluid and gas collection at the dome of the bladder of presenting either a large diverticulum or possibly a small abscess. These findings raise the possibility of fistula between sigmoid colon and bladder. Stomach/Bowel: There are inflammatory changes of the sigmoid colon adjacent to the dome of the bladder. This is characterized by wall thickening and stranding in the adjacent fat. Diverticulosis of the sigmoid and descending colon are noted. There is a gas and fluid collection inseparable from the sigmoid colon and dome of the bladder representing either a large inflamed diverticulum or possibly a small abscess. This may indicate a fistula. Distended small bowel loops with air-fluid levels are present in the upper abdomen. Distal small bowel loops are decompressed. The transition point is in the inflammatory process within the pelvis. Vascular/Lymphatic: Atherosclerotic vascular calcifications. No abnormal retroperitoneal adenopathy. Several prominent but non pathologically enlarged mesenteric nodes are noted and are likely inflammatory in nature. Reproductive: Uterus is absent. Other: There is stranding and inflammatory changes within the anterior pelvic fat adjacent to the area of diverticulitis. Multiple very small gas and fluid filled abscesses are present in the lower abdomen and upper pelvis. The largest measures 3.6 x 1.5 cm. A loop of bowel is seen anterior to this fluid collection. Small amount of free fluid about the liver. Musculoskeletal: No vertebral  compression deformity. IMPRESSION: Findings are again consistent with acute sigmoid diverticulitis. There are findings worrisome for a fistula between the sigmoid colon and bladder as described above. There are gas and fluid filled collections in the lower abdomen and upper pelvis compatible with multiple small abscesses. The largest is 3.6 x 1.5 cm. It is posterior to a loop of bowel and safe access for percutaneous drainage is not possible. Partial small bowel obstruction pattern. The transition point is in the pelvis at the site of the inflammatory process. Cholelithiasis. Bilateral pleural effusions. Electronically Signed   By: Marybelle Killings M.D.   On: 01/31/2019 11:51      Kalman Drape , [facility] 02/02/2019, 9:27 AM  Pager: [phone] Mon-Wed, Friday 7:00am-4:30pm Thurs 7am-11:30am  Consults: [phone]

## 2019-02-02 NOTE — Progress Notes (Signed)
Patient Demographics:    Denise Payne, is a 73 y.o. female, DOB - 18-Sep-1946, NUU:[id number]  Admit date - 01/25/2019   Admitting Physician Vianne Bulls, MD  Outpatient Primary MD for the patient is Leeroy Cha, MD  LOS - 8   Chief Complaint  Patient presents with   Abdominal Pain        Subjective:    Denise Payne today has no fevers, no emesis,  No chest pain, patient having diarrhea, WBC trending up,.... 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 CT also noted intraperitoneal free air suggesting microperforation. General surgery and urology consulted and patient admitted with zosyn given.   A/P 1)Acute sigmoid diverticulitis with microperforation and colovesical fistula: -WBC is back up to 14.6,  -Per surgical team hold off on discharge at this time until patient is tolerating full liquid diet - Urology planning to follow up as outpatient. Would perform concomitant surgery with gen surg when indicated.  2)FEN/Hypokalemia--intake is improving, potassium normalized, C. difficile testing ordered by surgical team due to diarrhea, C. difficile test is negative  3)Asthma: No acute exacerbation.  - Continue singulair and prn neb  4)Sjogren syndrome: Stable,  - Continue plaquenil.  5)Normocytic anemia, suspected to be due to chronic disease and Plaquenil use, anemia work-up reveals some degree of iron deficiency, ferritin is normal, serum iron is low at 14, iron saturation is low at 7, with a low TIBC of 347, Q25 and folic acid are not low.  No evidence of ongoing blood loss or bleeding, hemoglobin is stable above 9  6)H/o HTN: No BP medications PTA.  - Hydralazine IV prn  DVT prophylaxis: Lovenox ok per surgery Code Status: Full Family Communication: None at bedside Disposition Plan:  Most likely discharge home once tolerating oral intake well if no fevers and leukocytosis continues to improve  Consultants:   General surgery  Urology  Procedures:   None  Antimicrobials:  Zosyn   Disposition/Need for in-Hospital Stay- patient unable to be discharged at this time due to general surgery recommends against discharge home at this time as leukocytosis is worsening, abdominal distention and discomfort appears to be slightly worse as well   Code Status : Full  Family Communication:  na  Disposition Plan  : Home when oral intake is more reliable  DVT Prophylaxis  :  Lovenox   Lab Results  Component Value Date   PLT 378 02/02/2019    Inpatient Medications  Scheduled Meds:  enoxaparin (LOVENOX) injection  40 mg Subcutaneous Q24H   feeding supplement  1 Container Oral TID BM   guaiFENesin  600 mg Oral BID   hydroxychloroquine  200 mg Oral  BID   montelukast  10 mg Oral QHS   ondansetron (ZOFRAN) IV  4 mg Intravenous Once   pantoprazole (PROTONIX) IV  40 mg Intravenous QHS   Continuous Infusions:  sodium chloride Stopped (02/02/19 0026)   dextrose 5 % and 0.45 % NaCl with KCl 40 mEq/L 80 mL/hr at 02/02/19 0353   piperacillin-tazobactam (ZOSYN)  IV 3.375 g (02/02/19 0825)   PRN Meds:.sodium chloride, acetaminophen **OR** acetaminophen, albuterol, hydrALAZINE, morphine injection, ondansetron (ZOFRAN) IV,  oxyCODONE    Anti-infectives (From admission, onward)   Start     Dose/Rate Route Frequency Ordered Stop   01/26/19 1000  hydroxychloroquine (PLAQUENIL) tablet 200 mg     200 mg Oral 2 times daily 01/26/19 0041     01/26/19 0000  piperacillin-tazobactam (ZOSYN) IVPB 3.375 g  Status:  Discontinued     3.375 g 100 mL/hr over 30 Minutes Intravenous Every 6 hours 01/25/19 2138 01/25/19 2325   01/26/19 0000  piperacillin-tazobactam (ZOSYN) IVPB 3.375 g     3.375 g 12.5 mL/hr over 240 Minutes Intravenous Every 8 hours 01/25/19 2325     01/25/19 1845  piperacillin-tazobactam (ZOSYN) IVPB 3.375 g     3.375 g 100 mL/hr over 30 Minutes Intravenous  Once 01/25/19 1832 01/25/19 1927        Objective:   Vitals:   02/01/19 2347 02/02/19 0427 02/02/19 0902 02/02/19 1302  BP: (!) 158/73 127/65 118/80 (!) 165/84  Pulse: 93 82 90 98  Resp: 16 18 19 19   Temp: 98.6 F (37 C) 98.4 F (36.9 C) 98.6 F (37 C) 99.3 F (37.4 C)  TempSrc: Oral Oral Oral Oral  SpO2: 94% 92% 99% 98%  Weight:      Height:        Wt Readings from Last 3 Encounters:  01/25/19 81.6 kg  09/28/18 81.6 kg     Intake/Output Summary (Last 24 hours) at 02/02/2019 1606 Last data filed at 02/02/2019 0026 Gross per 24 hour  Intake 3996.02 ml  Output --  Net 3996.02 ml    Physical Exam Patient is examined daily including today on 02/02/19 , exams remain the same as of yesterday except that has changed   Gen:- Awake Alert,  In no apparent distress  HEENT:- [place].AT, No sclera icterus Neck-Supple Neck,No JVD,.  Lungs-  CTAB , fair symmetrical air movement CV- S1, S2 normal, regular  Abd-  +ve B.Sounds, Abd Soft, lower abdominal tenderness improving especially in the left lower quadrant, slightly distended Extremity/Skin:- No  edema, pedal pulses present  Psych-affect is appropriate, oriented x3 Neuro-no new focal deficits, no tremors   Data Review:   Micro Results Recent Results (from the past 240 hour(s))  Urine  Culture     Status: Abnormal   Collection Time: 01/26/19  4:04 AM  Result Value Ref Range Status   Specimen Description URINE, CLEAN CATCH  Final   Special Requests NONE  Final   Culture (A)  Final    <10,000 COLONIES/mL INSIGNIFICANT GROWTH Performed at [facility], [address], [place], [place] [address]    Report Status 01/27/2019 FINAL  Final  Culture, blood (routine x 2)     Status: None   Collection Time: 01/26/19 10:25 AM  Result Value Ref Range Status   Specimen Description BLOOD LEFT HAND  Final   Special Requests AEROBIC BOTTLE ONLY Blood Culture adequate volume  Final   Culture   Final    NO GROWTH 5 DAYS Performed at [facility], [place] [address], [place], [place] [address]    Report Status 01/31/2019 FINAL  Final  Culture, blood (routine x 2)     Status: None   Collection Time: 01/26/19 10:28 AM  Result Value Ref Range Status   Specimen Description BLOOD RIGHT ANTECUBITAL  Final   Special Requests AEROBIC BOTTLE ONLY Blood Culture adequate volume  Final   Culture   Final    NO GROWTH 5 DAYS Performed at [facility], [place] [address] [place], [place] [address]    Report Status 01/31/2019 FINAL  Final  C difficile quick scan w PCR reflex     Status: None   Collection Time: 02/02/19  2:23 PM  Result Value Ref Range Status   C Diff antigen NEGATIVE NEGATIVE Final   C Diff toxin NEGATIVE NEGATIVE Final   C Diff interpretation No C. difficile detected.  Final    Comment: Performed at [facility], [place] [address] [place], [place] [address]    Radiology Reports Ct Abdomen Pelvis W Contrast  Result Date: 01/31/2019 CLINICAL DATA:  Follow-up diverticulitis EXAM: CT ABDOMEN AND PELVIS WITH CONTRAST TECHNIQUE: Multidetector CT imaging of the abdomen and pelvis was performed using the standard protocol following bolus administration of intravenous contrast. CONTRAST:  176mL OMNIPAQUE IOHEXOL 300 MG/ML  SOLN COMPARISON:  01/25/2019 FINDINGS: Lower  chest: Moderate right right and small left pleural effusion with dependent atelectasis. Interlobular septal thickening at the lung bases likely represents a small amount of edema. Hepatobiliary: Cholelithiasis.  Liver is unremarkable. Pancreas: Unremarkable Spleen: Calcified granuloma.  Otherwise unremarkable. Adrenals/Urinary Tract: Kidneys are within normal limits. Adrenal glands are unremarkable. Bladder contains a small amount of gas. There is a 1.7 x 1.5 cm fluid and gas collection at the dome of the bladder of presenting either a large diverticulum or possibly a small abscess. These findings raise the possibility of fistula between sigmoid colon and bladder. Stomach/Bowel: There are inflammatory changes of the sigmoid colon adjacent to the dome of the bladder. This is characterized by wall thickening and stranding in the adjacent fat. Diverticulosis of the sigmoid and descending colon are noted. There is a gas and fluid collection inseparable from the sigmoid colon and dome of the bladder representing either a large inflamed diverticulum or possibly a small abscess. This may indicate a fistula. Distended small bowel loops with air-fluid levels are present in the upper abdomen. Distal small bowel loops are decompressed. The transition point is in the inflammatory process within the pelvis. Vascular/Lymphatic: Atherosclerotic vascular calcifications. No abnormal retroperitoneal adenopathy. Several prominent but non pathologically enlarged mesenteric nodes are noted and are likely inflammatory in nature. Reproductive: Uterus is absent. Other: There is stranding and inflammatory changes within the anterior pelvic fat adjacent to the area of diverticulitis. Multiple very small gas and fluid filled abscesses are present in the lower abdomen and upper pelvis. The largest measures 3.6 x 1.5 cm. A loop of bowel is seen anterior to this fluid collection. Small amount of free fluid about the liver. Musculoskeletal: No  vertebral compression deformity. IMPRESSION: Findings are again consistent with acute sigmoid diverticulitis. There are findings worrisome for a fistula between the sigmoid colon and bladder as described above. There are gas and fluid filled collections in the lower abdomen and upper pelvis compatible with multiple small abscesses. The largest is 3.6 x 1.5 cm. It is posterior to a loop of bowel and safe access for percutaneous drainage is not possible. Partial small bowel obstruction pattern.  The transition point is in the pelvis at the site of the inflammatory process. Cholelithiasis. Bilateral pleural effusions. Electronically Signed   By: Marybelle Killings M.D.   On: 01/31/2019 11:51   Ct Abdomen Pelvis W Contrast  Result Date: 01/25/2019 CLINICAL DATA:  Diffuse abdominal pain. History of GERD, nausea. EXAM: CT ABDOMEN AND PELVIS WITH CONTRAST TECHNIQUE: Multidetector CT imaging of the abdomen and pelvis was performed using the standard protocol following bolus administration of intravenous contrast. CONTRAST:  13mL OMNIPAQUE IOHEXOL 300 MG/ML  SOLN COMPARISON:  None. FINDINGS: Lower chest: Mild bibasilar scarring/atelectasis. Hepatobiliary: Numerous stones within the a otherwise unremarkable gallbladder. No focal liver abnormality. No significant bile duct dilatation seen. Pancreas: Unremarkable. No pancreatic ductal dilatation or surrounding inflammatory changes. Spleen: Normal in size without focal abnormality. Adrenals/Urinary Tract: Adrenal glands appear normal. Kidneys are unremarkable without mass, stone or hydronephrosis. No perinephric fluid. Air within the bladder. Stomach/Bowel: Extensive diverticulosis throughout the colon. Inflammation/fluid stranding is seen about the sigmoid colon indicating acute diverticulitis. The inflamed segment of bowel is contiguous with the anterior-superior margin of the bladder concerning for fistula. No dilated large or small bowel loops. Stomach is unremarkable,  decompressed. Appendix is normal. Vascular/Lymphatic: Aortic atherosclerosis. No acute appearing vascular abnormality. Numerous small lymph nodes within the lower mesentery, likely reactive in nature. Reproductive: Presumed hysterectomy. No adnexal mass. Other: Small amount of free intraperitoneal air within the central abdomen and upper abdomen. No abscess collection seen. Musculoskeletal: No acute or suspicious osseous finding. IMPRESSION: 1. Diverticulitis of the sigmoid colon. The inflamed segment of sigmoid colon is contiguous with the anterior-superior margin of the bladder indicating colovesical fistula, with associated air in the bladder. No circumscribed abscess collection identified. 2. Free intraperitoneal air within the abdomen and pelvis indicating associated bowel perforation, likely micro perforation given the small amount of free air. 3. Cholelithiasis without evidence of acute cholecystitis. These results were called by telephone at the time of interpretation on 01/25/2019 at 6:25 pm to Dr. Jola Schmidt , who verbally acknowledged these results. Aortic Atherosclerosis (ICD10-I70.0). Electronically Signed   By: Franki Cabot M.D.   On: 01/25/2019 18:27     CBC Recent Labs  Lab 01/27/19 0526  01/29/19 0354 01/30/19 0535 01/31/19 0350 02/01/19 0429 02/02/19 0907  WBC 13.6*   < > 9.4 16.6* 13.7* 13.3* 14.6*  HGB 10.8*   < > 9.5* 10.4* 9.4* 9.7* 9.8*  HCT 34.7*   < > 29.6* 32.2* 30.2* 30.9* 31.9*  PLT 265   < > 270 313 355 314 378  MCV 87.8   < > 86.8 87.3 86.8 87.5 86.9  MCH 27.3   < > 27.9 28.2 27.0 27.5 26.7  MCHC 31.1   < > 32.1 32.3 31.1 31.4 30.7  RDW 12.9   < > 13.3 13.4 13.4 13.8 13.6  LYMPHSABS 0.7  --   --   --  1.5  --   --   MONOABS 0.8  --   --   --  0.8  --   --   EOSABS 0.1  --   --   --  0.2  --   --   BASOSABS 0.0  --   --   --  0.1  --   --    < > = values in this interval not displayed.    Chemistries  Recent Labs  Lab 01/30/19 0830 01/31/19 0350  02/01/19 0429 02/02/19 1106  NA 141 142 140 136  K 3.0* 2.9* 3.3* 3.8  CL 106 106 103 104  CO2 23 26 24 22   GLUCOSE 77 97 103* 101*  BUN 15 15 10 8   CREATININE 0.75 0.82 0.61 0.65  CALCIUM 8.2* 8.3* 8.3* 8.7*  MG  --   --  1.9  --   AST 16  --   --   --   ALT 14  --   --   --   ALKPHOS 105  --   --   --   BILITOT 1.1  --   --   --    ------------------------------------------------------------------------------------------------------------------ No results for input(s): CHOL, HDL, LDLCALC, TRIG, CHOLHDL, LDLDIRECT in the last 72 hours.  No results found for: HGBA1C ------------------------------------------------------------------------------------------------------------------ No results for input(s): TSH, T4TOTAL, T3FREE, THYROIDAB in the last 72 hours.  Invalid input(s): FREET3 ------------------------------------------------------------------------------------------------------------------ Recent Labs    02/01/19 0429  VITAMINB12 3,361*  FOLATE 16.6  FERRITIN 152  TIBC 193*  IRON 14*  RETICCTPCT 1.2    Coagulation profile No results for input(s): INR, PROTIME in the last 168 hours.  No results for input(s): DDIMER in the last 72 hours.  Cardiac Enzymes No results for input(s): CKMB, TROPONINI, MYOGLOBIN in the last 168 hours.  Invalid input(s): CK ------------------------------------------------------------------------------------------------------------------ No results found for: BNP   Roxan Hockey M.D on 02/02/2019 at 4:06 PM  Go to [email or web address] - for contact info  [facility] - Office  [phone]

## 2019-02-03 LAB — CBC
HCT: 29.9 % — ABNORMAL LOW (ref 36.0–46.0)
Hemoglobin: 9.7 g/dL — ABNORMAL LOW (ref 12.0–15.0)
MCH: 27.9 pg (ref 26.0–34.0)
MCHC: 32.4 g/dL (ref 30.0–36.0)
MCV: 85.9 fL (ref 80.0–100.0)
Platelets: 353 10*3/uL (ref 150–400)
RBC: 3.48 MIL/uL — ABNORMAL LOW (ref 3.87–5.11)
RDW: 13.6 % (ref 11.5–15.5)
WBC: 15.3 10*3/uL — ABNORMAL HIGH (ref 4.0–10.5)
nRBC: 0 % (ref 0.0–0.2)

## 2019-02-03 MED ORDER — PANTOPRAZOLE SODIUM 40 MG PO TBEC
40.0000 mg | DELAYED_RELEASE_TABLET | Freq: Every day | ORAL | Status: DC
  Administered 2019-02-03 – 2019-02-08 (×6): 40 mg via ORAL
  Filled 2019-02-03 (×6): qty 1

## 2019-02-03 MED ORDER — ENSURE ENLIVE PO LIQD
237.0000 mL | Freq: Two times a day (BID) | ORAL | Status: DC
  Administered 2019-02-03 – 2019-02-04 (×2): 237 mL via ORAL

## 2019-02-03 MED ORDER — HYDRALAZINE HCL 20 MG/ML IJ SOLN: 10.0000 mg | Freq: Four times a day (QID) | INTRAMUSCULAR | Status: DC | PRN

## 2019-02-03 NOTE — Progress Notes (Signed)
TM:LYYTKPTWS pain  Subjective: She continues to have intermittent pain, it is worse after eating or drinking.  Pain is primarily LLQ.  She continues to have diarrhea, perhaps a few less in numbers, but not in quantity.  Pt says her urine is clear.   Objective: Vital signs in last 24 hours: Temp:  [98 F (36.7 C)-99.3 F (37.4 C)] 98.4 F (36.9 C) (04/20 0751) Pulse Rate:  [87-99] 88 (04/20 0751) Resp:  [16-19] 16 (04/20 0751) BP: (118-182)/(60-96) 182/85 (04/20 0751) SpO2:  [93 %-99 %] 96 % (04/20 0751) Last BM Date: 02/02/19 240 PO recorded 1900 IV Urine x 1 Stool x 1 Afebrile, VSS BP up some intermittently CBC shows rising WBC 13.6(4/13)>>9.4(4/15)>>16.6(4/16)>>13.3(4/18)14.6(4/14)>>15.3(4/20) C diff is negative (4/19) CT 4/17:  Findings are again consistent with acute sigmoid diverticulitis. There are findings worrisome for a fistula between the sigmoid colon and bladder as described above. There are gas and fluid filled collections in the lower abdomen and upper pelvis compatible with multiple small abscesses. The largest is 3.6 x 1.5 cm. It is posterior to a loop of bowel and safe access for percutaneous drainage is not possible. Partial small bowel obstruction pattern. The transition point is in the pelvis at the site of the inflammatory process.  Intake/Output from previous day: 04/19 0701 - 04/20 0700 In: 2086.3 [P.O.:240; I.V.:1655.7; IV Piggyback:190.6] Out: -  Intake/Output this shift: No intake/output data recorded.  General appearance: alert, cooperative, no distress and anxious GI: soft, + BS, a bit hyperactive, No focal tenderness, but complains of pain LLQ.  ongoing loose stools.    Lab Results:  Recent Labs    02/02/19 0907 02/03/19 0544  WBC 14.6* 15.3*  HGB 9.8* 9.7*  HCT 31.9* 29.9*  PLT 378 353    BMET Recent Labs    02/01/19 0429 02/02/19 1106  NA 140 136  K 3.3* 3.8  CL 103 104  CO2 24 22  GLUCOSE 103* 101*  BUN 10 8   CREATININE 0.61 0.65  CALCIUM 8.3* 8.7*   PT/INR No results for input(s): LABPROT, INR in the last 72 hours.  Recent Labs  Lab 01/30/19 0830  AST 16  ALT 14  ALKPHOS 105  BILITOT 1.1  PROT 5.6*  ALBUMIN 2.3*     Lipase     Component Value Date/Time   LIPASE 26 01/25/2019 1623     Medications: . enoxaparin (LOVENOX) injection  40 mg Subcutaneous Q24H  . feeding supplement  1 Container Oral TID BM  . guaiFENesin  600 mg Oral BID  . hydroxychloroquine  200 mg Oral BID  . montelukast  10 mg Oral QHS  . ondansetron (ZOFRAN) IV  4 mg Intravenous Once  . pantoprazole  40 mg Oral Daily   . sodium chloride Stopped (02/02/19 2320)  . dextrose 5 % and 0.45 % NaCl with KCl 40 mEq/L 40 mL/hr at 02/03/19 0000  . piperacillin-tazobactam (ZOSYN)  IV 3.375 g (02/03/19 0823)   Anti-infectives (From admission, onward)   Start     Dose/Rate Route Frequency Ordered Stop   01/26/19 1000  hydroxychloroquine (PLAQUENIL) tablet 200 mg     200 mg Oral 2 times daily 01/26/19 0041     01/26/19 0000  piperacillin-tazobactam (ZOSYN) IVPB 3.375 g  Status:  Discontinued     3.375 g 100 mL/hr over 30 Minutes Intravenous Every 6 hours 01/25/19 2138 01/25/19 2325   01/26/19 0000  piperacillin-tazobactam (ZOSYN) IVPB 3.375 g     3.375 g 12.5  mL/hr over 240 Minutes Intravenous Every 8 hours 01/25/19 2325     01/25/19 1845  piperacillin-tazobactam (ZOSYN) IVPB 3.375 g     3.375 g 100 mL/hr over 30 Minutes Intravenous  Once 01/25/19 1832 01/25/19 1927      Assessment/Plan Asthma Sjogren's syndrome   Sigmoid Diverticulitis with microperforation Colovesical fistula-seen on CT 4/11  - No indications for urgent or emergent surgical interventionat this time -CT 04/17 showed multiple small abscess - Urology following. Appreciate their input - Mobilize, IS - WBC trending up  FEN:FLD VTE: SCD's,Lovenox, moblize QG:BEEFE 04/12 >>  day 9 Follow up:Dr. Dema Severin  Plan:  Continue abx,  Leave her on Full liquids, recheck labs tomorrow along with prealbumin.  If she does not improve soon she may require surgery this admission.    I do not see a recent CXR or EKG, on Plaquenil.      LOS: 9 days    Arisha Gervais 02/03/2019 [phone]

## 2019-02-03 NOTE — Progress Notes (Signed)
Patient Demographics:    Denise Payne, is a 73 y.o. female, DOB - 1946/06/06, WER:[id number]  Admit date - 01/25/2019   Admitting Physician Denise Bulls, MD  Outpatient Primary MD for the patient is Denise Cha, MD  LOS - 9   Chief Complaint  Patient presents with   Abdominal Pain        Subjective:    Denise Payne today has no fevers, no emesis,  No chest pain, nausea without emesis, diarrhea persist, C. difficile was negative,.. 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 CT also noted intraperitoneal free air suggesting microperforation. General surgery and urology consulted and patient admitted with zosyn given.   A/P 1)Acute sigmoid diverticulitis with microperforation and colovesical fistula: -WBC is back up to 15.3,  -Per surgical team hold off on discharge at this time as patient remains asymptomatic, WBC trending up - Urology planning to follow up as outpatient. Would perform concomitant surgery with gen surg when indicated. C/n IV Zosyn  2)FEN/Hypokalemia--intake is improving, potassium normalized,   C. difficile test is negative  3)Asthma: No acute exacerbation--- Continue singulair and prn neb  4)Sjogren syndrome: Stable, - Continue plaquenil.  5)Normocytic  anemia, suspected to be due to chronic disease and Plaquenil use, anemia work-up reveals some degree of iron deficiency, ferritin is normal, serum iron is low at 14, iron saturation is low at 7, with a low TIBC of 195, K93 and folic acid are not low.  No evidence of ongoing blood loss or bleeding, hemoglobin is stable above 9  6)H/o HTN: No BP medications PTA.  - Hydralazine IV prn  DVT prophylaxis: Lovenox ok per surgery Code Status: Full Family Communication: None at bedside Disposition Plan:  Most likely discharge home once tolerating oral intake well if no fevers and leukocytosis continues to improve  Consultants:   General surgery  Urology  Procedures:   None  Antimicrobials:  Zosyn   Disposition/Need for in-Hospital Stay- patient unable to be discharged at this time due to general surgery recommends against discharge home at this time as leukocytosis is worsening, abdominal distention and discomfort appears to be slightly worse as well   Code Status : Full  Family Communication:  na  Disposition Plan  : Home when oral intake is more reliable  DVT Prophylaxis  :  Lovenox   Lab Results  Component Value Date   PLT 353 02/03/2019    Inpatient Medications  Scheduled Meds:  enoxaparin (LOVENOX) injection  40 mg Subcutaneous Q24H   feeding supplement  1 Container Oral TID BM   guaiFENesin  600 mg Oral BID   hydroxychloroquine  200 mg Oral BID   montelukast  10  mg Oral QHS   ondansetron (ZOFRAN) IV  4 mg Intravenous Once   pantoprazole  40 mg Oral Daily   Continuous Infusions:  sodium chloride Stopped (02/02/19 2320)   dextrose 5 % and 0.45 % NaCl with KCl 40 mEq/L 40 mL/hr at 02/03/19 0000   piperacillin-tazobactam (ZOSYN)  IV 3.375 g (02/03/19 0823)   PRN Meds:.sodium chloride, acetaminophen **OR** acetaminophen, albuterol, hydrALAZINE, morphine injection, ondansetron (ZOFRAN) IV, oxyCODONE    Anti-infectives (From admission, onward)   Start      Dose/Rate Route Frequency Ordered Stop   01/26/19 1000  hydroxychloroquine (PLAQUENIL) tablet 200 mg     200 mg Oral 2 times daily 01/26/19 0041     01/26/19 0000  piperacillin-tazobactam (ZOSYN) IVPB 3.375 g  Status:  Discontinued     3.375 g 100 mL/hr over 30 Minutes Intravenous Every 6 hours 01/25/19 2138 01/25/19 2325   01/26/19 0000  piperacillin-tazobactam (ZOSYN) IVPB 3.375 g     3.375 g 12.5 mL/hr over 240 Minutes Intravenous Every 8 hours 01/25/19 2325     01/25/19 1845  piperacillin-tazobactam (ZOSYN) IVPB 3.375 g     3.375 g 100 mL/hr over 30 Minutes Intravenous  Once 01/25/19 1832 01/25/19 1927        Objective:   Vitals:   02/02/19 1939 02/03/19 0019 02/03/19 0418 02/03/19 0751  BP: (!) 160/72 (!) 158/78 (!) 155/60 (!) 182/85  Pulse: 87 95 90 88  Resp: 19 18 18 16   Temp: 99 F (37.2 C) 98.9 F (37.2 C) 98 F (36.7 C) 98.4 F (36.9 C)  TempSrc: Oral Oral Oral Oral  SpO2: 97% 93% 93% 96%  Weight:      Height:        Wt Readings from Last 3 Encounters:  01/25/19 81.6 kg  09/28/18 81.6 kg    Intake/Output Summary (Last 24 hours) at 02/03/2019 1041 Last data filed at 02/03/2019 0000 Gross per 24 hour  Intake 2086.33 ml  Output --  Net 2086.33 ml    Physical Exam Patient is examined daily including today on 02/03/19 , exams remain the same as of yesterday except that has changed   Gen:- Awake Alert,  In no apparent distress  HEENT:- [place].AT, No sclera icterus Neck-Supple Neck,No JVD,.  Lungs-  CTAB , fair symmetrical air movement CV- S1, S2 normal, regular  Abd-  +ve B.Sounds, Abd Soft, lower abdominal tenderness improving especially in the left lower quadrant, slightly distended Extremity/Skin:- No  edema, pedal pulses present  Psych-affect is appropriate, oriented x3 Neuro-no new focal deficits, no tremors   Data Review:   Micro Results Recent Results (from the past 240 hour(s))  Urine Culture     Status: Abnormal   Collection Time: 01/26/19   4:04 AM  Result Value Ref Range Status   Specimen Description URINE, CLEAN CATCH  Final   Special Requests NONE  Final   Culture (A)  Final    <10,000 COLONIES/mL INSIGNIFICANT GROWTH Performed at [facility], [address] [place],  [address]    Report Status 01/27/2019 FINAL  Final  Culture, blood (routine x 2)     Status: None   Collection Time: 01/26/19 10:25 AM  Result Value Ref Range Status   Specimen Description BLOOD LEFT HAND  Final   Special Requests AEROBIC BOTTLE ONLY Blood Culture adequate volume  Final   Culture   Final    NO GROWTH 5 DAYS Performed at [facility], [place],  [place] [address]    Report Status 01/31/2019 FINAL  Final  Culture, blood (routine x 2)     Status: None   Collection Time: 01/26/19 10:28 AM  Result Value Ref Range Status   Specimen Description BLOOD RIGHT ANTECUBITAL  Final   Special Requests AEROBIC BOTTLE ONLY Blood Culture adequate volume  Final   Culture   Final    NO GROWTH 5 DAYS Performed at [facility], [place] [address], [place], [place] [address]    Report Status 01/31/2019 FINAL  Final  C difficile quick scan w PCR reflex     Status: None   Collection Time: 02/02/19  2:23 PM  Result Value Ref Range Status   C Diff antigen NEGATIVE NEGATIVE Final   C Diff toxin NEGATIVE NEGATIVE Final   C Diff interpretation No C. difficile detected.  Final    Comment: Performed at [facility], [place] [address], [place], [place] [address]    Radiology Reports Ct Abdomen Pelvis W Contrast  Result Date: 01/31/2019 CLINICAL DATA:  Follow-up diverticulitis EXAM: CT ABDOMEN AND PELVIS WITH CONTRAST TECHNIQUE: Multidetector CT imaging of the abdomen and pelvis was performed using the standard protocol following bolus administration of intravenous contrast. CONTRAST:  170mL OMNIPAQUE IOHEXOL 300 MG/ML  SOLN COMPARISON:  01/25/2019 FINDINGS: Lower chest: Moderate right right and small left pleural effusion  with dependent atelectasis. Interlobular septal thickening at the lung bases likely represents a small amount of edema. Hepatobiliary: Cholelithiasis.  Liver is unremarkable. Pancreas: Unremarkable Spleen: Calcified granuloma.  Otherwise unremarkable. Adrenals/Urinary Tract: Kidneys are within normal limits. Adrenal glands are unremarkable. Bladder contains a small amount of gas. There is a 1.7 x 1.5 cm fluid and gas collection at the dome of the bladder of presenting either a large diverticulum or possibly a small abscess. These findings raise the possibility of fistula between sigmoid colon and bladder. Stomach/Bowel: There are inflammatory changes of the sigmoid colon adjacent to the dome of the bladder. This is characterized by wall thickening and stranding in the adjacent fat. Diverticulosis of the sigmoid and descending colon are noted. There is a gas and fluid collection inseparable from the sigmoid colon and dome of the bladder representing either a large inflamed diverticulum or possibly a small abscess. This may indicate a fistula. Distended small bowel loops with air-fluid levels are present in the upper abdomen. Distal small bowel loops are decompressed. The transition point is in the inflammatory process within the pelvis. Vascular/Lymphatic: Atherosclerotic vascular calcifications. No abnormal retroperitoneal adenopathy. Several prominent but non pathologically enlarged mesenteric nodes are noted and are likely inflammatory in nature. Reproductive: Uterus is absent. Other: There is stranding and inflammatory changes within the anterior pelvic fat adjacent to the area of diverticulitis. Multiple very small gas and fluid filled abscesses are present in the lower abdomen and upper pelvis. The largest measures 3.6 x 1.5 cm. A loop of bowel is seen anterior to this fluid collection. Small amount of free fluid about the liver. Musculoskeletal: No vertebral compression deformity. IMPRESSION: Findings are  again consistent with acute sigmoid diverticulitis. There are findings worrisome for a fistula between the sigmoid colon and bladder as described above. There are gas and fluid filled collections in the lower abdomen and upper pelvis compatible with multiple small abscesses. The largest is 3.6 x 1.5 cm. It is posterior to a loop of bowel and safe access for percutaneous drainage is not possible. Partial small bowel obstruction pattern. The transition point is in the pelvis  at the site of the inflammatory process. Cholelithiasis. Bilateral pleural effusions. Electronically Signed   By: Marybelle Killings M.D.   On: 01/31/2019 11:51   Ct Abdomen Pelvis W Contrast  Result Date: 01/25/2019 CLINICAL DATA:  Diffuse abdominal pain. History of GERD, nausea. EXAM: CT ABDOMEN AND PELVIS WITH CONTRAST TECHNIQUE: Multidetector CT imaging of the abdomen and pelvis was performed using the standard protocol following bolus administration of intravenous contrast. CONTRAST:  159mL OMNIPAQUE IOHEXOL 300 MG/ML  SOLN COMPARISON:  None. FINDINGS: Lower chest: Mild bibasilar scarring/atelectasis. Hepatobiliary: Numerous stones within the a otherwise unremarkable gallbladder. No focal liver abnormality. No significant bile duct dilatation seen. Pancreas: Unremarkable. No pancreatic ductal dilatation or surrounding inflammatory changes. Spleen: Normal in size without focal abnormality. Adrenals/Urinary Tract: Adrenal glands appear normal. Kidneys are unremarkable without mass, stone or hydronephrosis. No perinephric fluid. Air within the bladder. Stomach/Bowel: Extensive diverticulosis throughout the colon. Inflammation/fluid stranding is seen about the sigmoid colon indicating acute diverticulitis. The inflamed segment of bowel is contiguous with the anterior-superior margin of the bladder concerning for fistula. No dilated large or small bowel loops. Stomach is unremarkable, decompressed. Appendix is normal. Vascular/Lymphatic: Aortic  atherosclerosis. No acute appearing vascular abnormality. Numerous small lymph nodes within the lower mesentery, likely reactive in nature. Reproductive: Presumed hysterectomy. No adnexal mass. Other: Small amount of free intraperitoneal air within the central abdomen and upper abdomen. No abscess collection seen. Musculoskeletal: No acute or suspicious osseous finding. IMPRESSION: 1. Diverticulitis of the sigmoid colon. The inflamed segment of sigmoid colon is contiguous with the anterior-superior margin of the bladder indicating colovesical fistula, with associated air in the bladder. No circumscribed abscess collection identified. 2. Free intraperitoneal air within the abdomen and pelvis indicating associated bowel perforation, likely micro perforation given the small amount of free air. 3. Cholelithiasis without evidence of acute cholecystitis. These results were called by telephone at the time of interpretation on 01/25/2019 at 6:25 pm to Dr. Jola Schmidt , who verbally acknowledged these results. Aortic Atherosclerosis (ICD10-I70.0). Electronically Signed   By: Franki Cabot M.D.   On: 01/25/2019 18:27     CBC Recent Labs  Lab 01/30/19 0535 01/31/19 0350 02/01/19 0429 02/02/19 0907 02/03/19 0544  WBC 16.6* 13.7* 13.3* 14.6* 15.3*  HGB 10.4* 9.4* 9.7* 9.8* 9.7*  HCT 32.2* 30.2* 30.9* 31.9* 29.9*  PLT 313 355 314 378 353  MCV 87.3 86.8 87.5 86.9 85.9  MCH 28.2 27.0 27.5 26.7 27.9  MCHC 32.3 31.1 31.4 30.7 32.4  RDW 13.4 13.4 13.8 13.6 13.6  LYMPHSABS  --  1.5  --   --   --   MONOABS  --  0.8  --   --   --   EOSABS  --  0.2  --   --   --   BASOSABS  --  0.1  --   --   --     Chemistries  Recent Labs  Lab 01/30/19 0830 01/31/19 0350 02/01/19 0429 02/02/19 1106  NA 141 142 140 136  K 3.0* 2.9* 3.3* 3.8  CL 106 106 103 104  CO2 23 26 24 22   GLUCOSE 77 97 103* 101*  BUN 15 15 10 8   CREATININE 0.75 0.82 0.61 0.65  CALCIUM 8.2* 8.3* 8.3* 8.7*  MG  --   --  1.9  --   AST 16  --    --   --   ALT 14  --   --   --   ALKPHOS 105  --   --   --  BILITOT 1.1  --   --   --    ------------------------------------------------------------------------------------------------------------------ No results for input(s): CHOL, HDL, LDLCALC, TRIG, CHOLHDL, LDLDIRECT in the last 72 hours.  No results found for: HGBA1C ------------------------------------------------------------------------------------------------------------------ No results for input(s): TSH, T4TOTAL, T3FREE, THYROIDAB in the last 72 hours.  Invalid input(s): FREET3 ------------------------------------------------------------------------------------------------------------------ Recent Labs    02/01/19 0429  VITAMINB12 3,361*  FOLATE 16.6  FERRITIN 152  TIBC 193*  IRON 14*  RETICCTPCT 1.2    Coagulation profile No results for input(s): INR, PROTIME in the last 168 hours.  No results for input(s): DDIMER in the last 72 hours.  Cardiac Enzymes No results for input(s): CKMB, TROPONINI, MYOGLOBIN in the last 168 hours.  Invalid input(s): CK ------------------------------------------------------------------------------------------------------------------ No results found for: BNP   Roxan Hockey M.D on 02/03/2019 at 10:41 AM  Go to [email or web address] - for contact info  [facility] - Office  [phone]

## 2019-02-03 NOTE — Progress Notes (Signed)
Nutrition Follow-up   RD working remotely.  DOCUMENTATION CODES:   Obesity unspecified  INTERVENTION:  Continue Boost Breeze po TID, each supplement provides 250 kcal and 9 grams of protein.  Provide Ensure Enlive po BID, each supplement provides 350 kcal and 20 grams of protein  Encourage adequate PO intake.   NUTRITION DIAGNOSIS:   Inadequate oral intake related to inability to eat as evidenced by NPO status; diet advanced; progressing  GOAL:   Patient will meet greater than or equal to 90% of their needs; progressing  MONITOR:   Diet advancement, Weight trends, Labs, Skin, I & O's  REASON FOR ASSESSMENT:   NPO/Clear Liquid Diet    ASSESSMENT:   73 y.o. female with medical history significant for asthma and Sjogren syndrome, presenting to emergency department for evaluation of abdominal pain. CT the abdomen and pelvis is concerning for diverticulitis of the sigmoid colon with the inflamed segment contiguous with the anterior-superior margin of the urinary bladder indicating a colovesical fistula with associated air in the bladder..  Pt Is currently on a full liquid diet. Per MD, diarrhea persists. C. Diff negative. Despite diarrhea, pt has been tolerating liquid diet. Leukocytosis, abdominal distention, and abdominal discomfort worsening per MD. May require surgery if no improvement. Pt currently has Boost Breeze ordered and has been consuming them. RD to additionally order Ensure to aid in caloric and protein needs. Labs and medications reviewed.   Diet Order:   Diet Order            Diet full liquid Room service appropriate? Yes; Fluid consistency: Thin  Diet effective now              EDUCATION NEEDS:   Not appropriate for education at this time  Skin:  Skin Assessment: Reviewed RN Assessment  Last BM:  4/20  Height:   Ht Readings from Last 1 Encounters:  01/25/19 5' 2.5" (1.588 m)    Weight:   Wt Readings from Last 1 Encounters:  01/25/19 81.6 kg     Ideal Body Weight:  51 kg  BMI:  Body mass index is 32.4 kg/m.  Estimated Nutritional Needs:   Kcal:  1650-1850  Protein:  85-95 grams  Fluid:  1.7 -1.9 L/day    Corrin Parker, MS, RD, LDN Pager # [phone] After hours/ weekend pager # [phone]

## 2019-02-04 LAB — CBC
HCT: 29.2 % — ABNORMAL LOW (ref 36.0–46.0)
Hemoglobin: 9.3 g/dL — ABNORMAL LOW (ref 12.0–15.0)
MCH: 27 pg (ref 26.0–34.0)
MCHC: 31.8 g/dL (ref 30.0–36.0)
MCV: 84.9 fL (ref 80.0–100.0)
Platelets: 384 10*3/uL (ref 150–400)
RBC: 3.44 MIL/uL — ABNORMAL LOW (ref 3.87–5.11)
RDW: 13.4 % (ref 11.5–15.5)
WBC: 12.7 10*3/uL — ABNORMAL HIGH (ref 4.0–10.5)
nRBC: 0 % (ref 0.0–0.2)

## 2019-02-04 LAB — COMPREHENSIVE METABOLIC PANEL
ALT: 25 U/L (ref 0–44)
AST: 38 U/L (ref 15–41)
Albumin: 2.1 g/dL — ABNORMAL LOW (ref 3.5–5.0)
Alkaline Phosphatase: 104 U/L (ref 38–126)
Anion gap: 10 (ref 5–15)
BUN: 6 mg/dL — ABNORMAL LOW (ref 8–23)
CO2: 23 mmol/L (ref 22–32)
Calcium: 8.3 mg/dL — ABNORMAL LOW (ref 8.9–10.3)
Chloride: 103 mmol/L (ref 98–111)
Creatinine, Ser: 0.63 mg/dL (ref 0.44–1.00)
GFR calc Af Amer: >60 mL/min (ref >60)
GFR calc non Af Amer: >60 mL/min (ref >60)
Glucose, Bld: 99 mg/dL (ref 70–99)
Potassium: 3.5 mmol/L (ref 3.5–5.1)
Sodium: 136 mmol/L (ref 135–145)
Total Bilirubin: 0.6 mg/dL (ref 0.3–1.2)
Total Protein: 5.4 g/dL — ABNORMAL LOW (ref 6.5–8.1)

## 2019-02-04 LAB — TYPE AND SCREEN
ABO/RH(D): A POS
Antibody Screen: NEGATIVE

## 2019-02-04 LAB — PROTIME-INR
INR: 1.2 (ref 0.8–1.2)
Prothrombin Time: 15.5 seconds — ABNORMAL HIGH (ref 11.4–15.2)

## 2019-02-04 LAB — PREALBUMIN: Prealbumin: 5.9 mg/dL — ABNORMAL LOW (ref 18–38)

## 2019-02-04 LAB — ABO/RH: ABO/RH(D): A POS

## 2019-02-04 MED ORDER — SACCHAROMYCES BOULARDII 250 MG PO CAPS
250.0000 mg | ORAL_CAPSULE | Freq: Two times a day (BID) | ORAL | Status: DC
  Administered 2019-02-04 – 2019-02-08 (×9): 250 mg via ORAL
  Filled 2019-02-04 (×9): qty 1

## 2019-02-04 NOTE — Progress Notes (Signed)
   Subjective/Chief Complaint:   1 - Colovesical Fistula from Diverticulitis - left anterior dome bladder - sigmoid connection by CT 4/11 on eval abdominal pain. 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 Cr 0.8.  Placed on IV Zosyn. Admits to pneumaturia that comes and goes x months. Repeat imaging CT 4/17 with some progression of localized inflammation and ?small abscesses.   2 - Bacteruria - bacteruria on UA as expected in setting of CV fistula. UCX 4/12 scant growth.  PMH c/w Sjogren's / plaquenill, obesity, benign hyst (pfannenstiel). She is retired Therapist, sports. Her PCP is  Dr. Clayton Bibles with [facility] [place].   Today "Denise Payne" is feeling improved. Despite most recent imaging somewhat concerning, she is tolleratign PO with minimal abd pain. NO fecaluria / pneumaturia.     Objective: Vital signs in last 24 hours: Temp:  [98.4 F (36.9 C)-99 F (37.2 C)] 98.7 F (37.1 C) (04/21 0734) Pulse Rate:  [83-90] 83 (04/21 0734) Resp:  [16-18] 16 (04/21 0734) BP: (138-182)/(61-87) 140/73 (04/21 0734) SpO2:  [95 %-99 %] 95 % (04/21 0734) Last BM Date: 02/03/19  Intake/Output from previous day: 04/20 0701 - 04/21 0700 In: 1521.3 [P.O.:240; I.V.:1129.3; IV Piggyback:152] Out: -  Intake/Output this shift: No intake/output data recorded.   Physical Exam  Constitutional: She appears well-developed.    HENT:  Head: Normocephalic.  Eyes: Pupils are equal, round, and reactive to light.  Cardiovascular: Normal rate.  Respiratory: Effort normal.  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  No TTP whatsever today.  Genitourinary:    Genitourinary Comments: No CVAT. NO catheters.    Musculoskeletal: Normal range of motion.  Neurological: She is alert.  Skin: Skin is warm.  Psychiatric: She has a normal mood and affect.  Lab Results:  Recent Labs    02/03/19 0544 02/04/19 0433  WBC 15.3* 12.7*  HGB 9.7* 9.3*  HCT 29.9* 29.2*  PLT 353 384   BMET Recent Labs    02/02/19 1106 02/04/19 0433  NA 136 136  K  3.8 3.5  CL 104 103  CO2 22 23  GLUCOSE 101* 99  BUN 8 6*  CREATININE 0.65 0.63  CALCIUM 8.7* 8.3*   PT/INR Recent Labs    02/04/19 0433  LABPROT 15.5*  INR 1.2   ABG No results for input(s): PHART, HCO3 in the last 72 hours.  Invalid input(s): PCO2, PO2  Studies/Results: No results found.  Anti-infectives: Anti-infectives (From admission, onward)   Start     Dose/Rate Route Frequency Ordered Stop   01/26/19 1000  hydroxychloroquine (PLAQUENIL) tablet 200 mg     200 mg Oral 2 times daily 01/26/19 0041     01/26/19 0000  piperacillin-tazobactam (ZOSYN) IVPB 3.375 g  Status:  Discontinued     3.375 g 100 mL/hr over 30 Minutes Intravenous Every 6 hours 01/25/19 2138 01/25/19 2325   01/26/19 0000  piperacillin-tazobactam (ZOSYN) IVPB 3.375 g     3.375 g 12.5 mL/hr over 240 Minutes Intravenous Every 8 hours 01/25/19 2325     01/25/19 1845  piperacillin-tazobactam (ZOSYN) IVPB 3.375 g     3.375 g 100 mL/hr over 30 Minutes Intravenous  Once 01/25/19 1832 01/25/19 1927      Assessment/Plan:  Agree pt riding line in terms of acute surgical indications. Her exam is fortunately much improved and recent history reassuring. I agree contineud conservative management is reasonable in short term.    Please call me directly with questions anytime.    Denise Payne 02/04/2019

## 2019-02-04 NOTE — Progress Notes (Signed)
    UL:AGTXMIWOE pain  Subjective: Feeling better this AM, no pain on exam, pain when present is in the LLQ.  Diarrhea better, none yesterday.    Objective: Vital signs in last 24 hours: Temp:  [98.4 F (36.9 C)-99 F (37.2 C)] 98.7 F (37.1 C) (04/21 0734) Pulse Rate:  [83-90] 83 (04/21 0734) Resp:  [16-18] 16 (04/21 0734) BP: (138-161)/(61-87) 140/73 (04/21 0734) SpO2:  [95 %-99 %] 95 % (04/21 0734) Last BM Date: 02/03/19 240 PO 1300 IV Voided x 1 recorded, NO BM recorded Afebrile, VSS CMP stable Prealbumin 5.9 WBC down to 12.7  Intake/Output from previous day: 04/20 0701 - 04/21 0700 In: 1521.3 [P.O.:240; I.V.:1129.3; IV Piggyback:152] Out: -  Intake/Output this shift: No intake/output data recorded.  General appearance: alert, cooperative and no distress Resp: clear to auscultation bilaterally GI: soft, non-tender; bowel sounds normal; no masses,  no organomegaly  Lab Results:  Recent Labs    02/03/19 0544 02/04/19 0433  WBC 15.3* 12.7*  HGB 9.7* 9.3*  HCT 29.9* 29.2*  PLT 353 384    BMET Recent Labs    02/02/19 1106 02/04/19 0433  NA 136 136  K 3.8 3.5  CL 104 103  CO2 22 23  GLUCOSE 101* 99  BUN 8 6*  CREATININE 0.65 0.63  CALCIUM 8.7* 8.3*   PT/INR Recent Labs    02/04/19 0433  LABPROT 15.5*  INR 1.2    Recent Labs  Lab 01/30/19 0830 02/04/19 0433  AST 16 38  ALT 14 25  ALKPHOS 105 104  BILITOT 1.1 0.6  PROT 5.6* 5.4*  ALBUMIN 2.3* 2.1*     Lipase     Component Value Date/Time   LIPASE 26 01/25/2019 1623     Medications: . enoxaparin (LOVENOX) injection  40 mg Subcutaneous Q24H  . feeding supplement  1 Container Oral TID BM  . feeding supplement (ENSURE ENLIVE)  237 mL Oral BID BM  . guaiFENesin  600 mg Oral BID  . hydroxychloroquine  200 mg Oral BID  . montelukast  10 mg Oral QHS  . ondansetron (ZOFRAN) IV  4 mg Intravenous Once  . pantoprazole  40 mg Oral Daily   Anti-infectives (From admission, onward)   Start     Dose/Rate Route Frequency Ordered Stop   01/26/19 1000  hydroxychloroquine (PLAQUENIL) tablet 200 mg     200 mg Oral 2 times daily 01/26/19 0041     01/26/19 0000  piperacillin-tazobactam (ZOSYN) IVPB 3.375 g  Status:  Discontinued     3.375 g 100 mL/hr over 30 Minutes Intravenous Every 6 hours 01/25/19 2138 01/25/19 2325   01/26/19 0000  piperacillin-tazobactam (ZOSYN) IVPB 3.375 g     3.375 g 12.5 mL/hr over 240 Minutes Intravenous Every 8 hours 01/25/19 2325     01/25/19 1845  piperacillin-tazobactam (ZOSYN) IVPB 3.375 g     3.375 g 100 mL/hr over 30 Minutes Intravenous  Once 01/25/19 1832 01/25/19 1927      Assessment/Plan Asthma Sjogren's syndrome   Sigmoid Diverticulitis with microperforation Colovesical fistula-seen on CT 4/11  - No indications for urgent or emergent surgical interventionat this time -CT 04/17 showed multiple small abscess - Urology following. Appreciate their input - Mobilize, IS - WBC 13.6(4/13)>>9.4(4/15)>>16.6(4/16)>>13.3(4/18)14.6(4/14)>>15.3(4/20) 12.7(4/21) FEN:FLD VTE: SCD's,Lovenox, moblize HO:ZYYQM 04/12 >>day 10 Follow up:Dr. Dema Severin   Plan:  Advance to a soft diet, follow WBC, add probiotic.       LOS: 10 days    Delando Satter 02/04/2019 [phone]

## 2019-02-04 NOTE — Progress Notes (Signed)
Patient Demographics:    Denise Payne, is a 73 y.o. 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 CT also noted intraperitoneal free air suggesting microperforation. General surgery and urology consulted and patient admitted with zosyn given.   A/P 1)Acute sigmoid diverticulitis with microperforation and colovesical fistula: -Per surgical team hold off on discharge at this time  WBC down to 12.7 from 15.3 - Urology planning to follow up as outpatient. Would perform concomitant surgery with gen surg when indicated. Consider stopping IV Zosyn 02/05/2019 (started 01/25/19)  per surgical team okay to advance diet to soft No further emesis frequency and consistency of stool appears to be improving, C. difficile was negative  2)FEN/Hypokalemia--intake is improving, potassium normalized,    3)Asthma: No acute exacerbation--- Continue  singulair and prn neb  4)Sjogren syndrome: Stable, - Continue plaquenil.  5)Normocytic anemia, suspected to be due to chronic disease and Plaquenil use, anemia work-up reveals some degree of iron deficiency, ferritin is normal, serum iron is low at 14, iron saturation is low at 7, with a low TIBC of 811, B14 and folic acid are not low.  No evidence of ongoing blood loss or bleeding, hemoglobin is stable above 9  6)H/o HTN: No BP medications PTA.  - Hydralazine IV prn  DVT prophylaxis: Lovenox ok per surgery Code Status: Full Family Communication: None at bedside Disposition Plan:  Most likely discharge home once tolerating oral intake well if no fevers and leukocytosis continues to improve--  Consultants:   General surgery  Urology  Procedures:   None  Antimicrobials:  Zosyn from 01/25/2019  Disposition/Need for in-Hospital Stay- patient unable to be discharged at this time due to general surgery recommends against discharge home at this time as leukocytosis is worsening, abdominal distention and discomfort appears to be slightly worse as well  Lab Results  Component Value Date   PLT 384 02/04/2019    Inpatient Medications  Scheduled Meds:  enoxaparin (LOVENOX) injection  40 mg Subcutaneous Q24H   feeding supplement  1 Container Oral TID BM   feeding supplement (ENSURE ENLIVE)  237 mL Oral BID BM   guaiFENesin  600 mg Oral BID   hydroxychloroquine  200 mg Oral BID   montelukast  10  mg Oral QHS   ondansetron (ZOFRAN) IV  4 mg Intravenous Once   pantoprazole  40 mg Oral Daily   saccharomyces boulardii  250 mg Oral BID   Continuous Infusions:  sodium chloride 250 mL (02/04/19 0419)   dextrose 5 % and 0.45 % NaCl with KCl 40 mEq/L 40 mL/hr at 02/04/19 1542   piperacillin-tazobactam (ZOSYN)  IV 3.375 g (02/04/19 0904)   PRN Meds:.sodium chloride, acetaminophen **OR** acetaminophen, albuterol, hydrALAZINE, morphine injection, ondansetron (ZOFRAN) IV,  oxyCODONE   Anti-infectives (From admission, onward)   Start     Dose/Rate Route Frequency Ordered Stop   01/26/19 1000  hydroxychloroquine (PLAQUENIL) tablet 200 mg     200 mg Oral 2 times daily 01/26/19 0041     01/26/19 0000  piperacillin-tazobactam (ZOSYN) IVPB 3.375 g  Status:  Discontinued     3.375 g 100 mL/hr over 30 Minutes Intravenous Every 6 hours 01/25/19 2138 01/25/19 2325   01/26/19 0000  piperacillin-tazobactam (ZOSYN) IVPB 3.375 g     3.375 g 12.5 mL/hr over 240 Minutes Intravenous Every 8 hours 01/25/19 2325     01/25/19 1845  piperacillin-tazobactam (ZOSYN) IVPB 3.375 g     3.375 g 100 mL/hr over 30 Minutes Intravenous  Once 01/25/19 1832 01/25/19 1927        Objective:   Vitals:   02/03/19 2356 02/04/19 0510 02/04/19 0734 02/04/19 1235  BP: (!) 160/87 138/71 140/73 133/72  Pulse:   83 86  Resp: 18 18 16 18   Temp: 98.7 F (37.1 C) 98.4 F (36.9 C) 98.7 F (37.1 C) 98.3 F (36.8 C)  TempSrc: Oral Oral Oral Oral  SpO2: 96% 96% 95% 94%  Weight:      Height:        Wt Readings from Last 3 Encounters:  01/25/19 81.6 kg  09/28/18 81.6 kg    Intake/Output Summary (Last 24 hours) at 02/04/2019 1700 Last data filed at 02/04/2019 0300 Gross per 24 hour  Intake 1521.29 ml  Output --  Net 1521.29 ml    Physical Exam Patient is examined daily including today on 02/04/19 , exams remain the same as of yesterday except that has changed   Gen:- Awake Alert,  In no apparent distress  HEENT:- [place].AT, No sclera icterus Neck-Supple Neck,No JVD,.  Lungs-  CTAB , fair symmetrical air movement CV- S1, S2 normal, regular  Abd-  +ve B.Sounds, Abd Soft, lower abdominal tenderness improving especially in the left lower quadrant, slightly distended Extremity/Skin:- No  edema, pedal pulses present  Psych-affect is appropriate, oriented x3 Neuro-no new focal deficits, no tremors   Data Review:   Micro Results Recent Results (from the past 240 hour(s))  Urine Culture      Status: Abnormal   Collection Time: 01/26/19  4:04 AM  Result Value Ref Range Status   Specimen Description URINE, CLEAN CATCH  Final   Special Requests NONE  Final   Culture (A)  Final    <10,000 COLONIES/mL INSIGNIFICANT GROWTH Performed at [facility], [address], [place],  [address]    Report Status 01/27/2019 FINAL  Final  Culture, blood (routine x 2)     Status: None   Collection Time: 01/26/19 10:25 AM  Result Value Ref Range Status   Specimen Description BLOOD LEFT HAND  Final   Special Requests AEROBIC BOTTLE ONLY Blood Culture adequate volume  Final   Culture   Final    NO GROWTH 5 DAYS Performed at [facility]  [facility], [address], [place], [place] [address]    Report Status 01/31/2019 FINAL  Final  Culture, blood (routine x 2)     Status: None   Collection Time: 01/26/19 10:28 AM  Result Value Ref Range Status   Specimen Description BLOOD RIGHT ANTECUBITAL  Final   Special Requests AEROBIC BOTTLE ONLY Blood Culture adequate volume  Final   Culture   Final    NO GROWTH 5 DAYS Performed at [facility], [place] [address] [place], [place] [address]    Report Status 01/31/2019 FINAL  Final  C difficile quick scan w PCR reflex     Status: None   Collection Time: 02/02/19  2:23 PM  Result Value Ref Range Status   C Diff antigen NEGATIVE NEGATIVE Final   C Diff toxin NEGATIVE NEGATIVE Final   C Diff interpretation No C. difficile detected.  Final    Comment: Performed at [facility], [place] [address] [place], [place] [address]    Radiology Reports Ct Abdomen Pelvis W Contrast  Result Date: 01/31/2019 CLINICAL DATA:  Follow-up diverticulitis EXAM: CT ABDOMEN AND PELVIS WITH CONTRAST TECHNIQUE: Multidetector CT imaging of the abdomen and pelvis was performed using the standard protocol following bolus administration of intravenous contrast. CONTRAST:  172mL OMNIPAQUE IOHEXOL 300 MG/ML  SOLN COMPARISON:  01/25/2019 FINDINGS: Lower chest:  Moderate right right and small left pleural effusion with dependent atelectasis. Interlobular septal thickening at the lung bases likely represents a small amount of edema. Hepatobiliary: Cholelithiasis.  Liver is unremarkable. Pancreas: Unremarkable Spleen: Calcified granuloma.  Otherwise unremarkable. Adrenals/Urinary Tract: Kidneys are within normal limits. Adrenal glands are unremarkable. Bladder contains a small amount of gas. There is a 1.7 x 1.5 cm fluid and gas collection at the dome of the bladder of presenting either a large diverticulum or possibly a small abscess. These findings raise the possibility of fistula between sigmoid colon and bladder. Stomach/Bowel: There are inflammatory changes of the sigmoid colon adjacent to the dome of the bladder. This is characterized by wall thickening and stranding in the adjacent fat. Diverticulosis of the sigmoid and descending colon are noted. There is a gas and fluid collection inseparable from the sigmoid colon and dome of the bladder representing either a large inflamed diverticulum or possibly a small abscess. This may indicate a fistula. Distended small bowel loops with air-fluid levels are present in the upper abdomen. Distal small bowel loops are decompressed. The transition point is in the inflammatory process within the pelvis. Vascular/Lymphatic: Atherosclerotic vascular calcifications. No abnormal retroperitoneal adenopathy. Several prominent but non pathologically enlarged mesenteric nodes are noted and are likely inflammatory in nature. Reproductive: Uterus is absent. Other: There is stranding and inflammatory changes within the anterior pelvic fat adjacent to the area of diverticulitis. Multiple very small gas and fluid filled abscesses are present in the lower abdomen and upper pelvis. The largest measures 3.6 x 1.5 cm. A loop of bowel is seen anterior to this fluid collection. Small amount of free fluid about the liver. Musculoskeletal: No vertebral  compression deformity. IMPRESSION: Findings are again consistent with acute sigmoid diverticulitis. There are findings worrisome for a fistula between the sigmoid colon and bladder as described above. There are gas and fluid filled collections in the lower abdomen and upper pelvis compatible with multiple small abscesses. The largest is 3.6 x 1.5 cm. It is posterior to a loop of bowel and safe access for percutaneous drainage is not possible. Partial small bowel obstruction pattern. The  transition point is in the pelvis at the site of the inflammatory process. Cholelithiasis. Bilateral pleural effusions. Electronically Signed   By: Marybelle Killings M.D.   On: 01/31/2019 11:51   Ct Abdomen Pelvis W Contrast  Result Date: 01/25/2019 CLINICAL DATA:  Diffuse abdominal pain. History of GERD, nausea. EXAM: CT ABDOMEN AND PELVIS WITH CONTRAST TECHNIQUE: Multidetector CT imaging of the abdomen and pelvis was performed using the standard protocol following bolus administration of intravenous contrast. CONTRAST:  177mL OMNIPAQUE IOHEXOL 300 MG/ML  SOLN COMPARISON:  None. FINDINGS: Lower chest: Mild bibasilar scarring/atelectasis. Hepatobiliary: Numerous stones within the a otherwise unremarkable gallbladder. No focal liver abnormality. No significant bile duct dilatation seen. Pancreas: Unremarkable. No pancreatic ductal dilatation or surrounding inflammatory changes. Spleen: Normal in size without focal abnormality. Adrenals/Urinary Tract: Adrenal glands appear normal. Kidneys are unremarkable without mass, stone or hydronephrosis. No perinephric fluid. Air within the bladder. Stomach/Bowel: Extensive diverticulosis throughout the colon. Inflammation/fluid stranding is seen about the sigmoid colon indicating acute diverticulitis. The inflamed segment of bowel is contiguous with the anterior-superior margin of the bladder concerning for fistula. No dilated large or small bowel loops. Stomach is unremarkable, decompressed.  Appendix is normal. Vascular/Lymphatic: Aortic atherosclerosis. No acute appearing vascular abnormality. Numerous small lymph nodes within the lower mesentery, likely reactive in nature. Reproductive: Presumed hysterectomy. No adnexal mass. Other: Small amount of free intraperitoneal air within the central abdomen and upper abdomen. No abscess collection seen. Musculoskeletal: No acute or suspicious osseous finding. IMPRESSION: 1. Diverticulitis of the sigmoid colon. The inflamed segment of sigmoid colon is contiguous with the anterior-superior margin of the bladder indicating colovesical fistula, with associated air in the bladder. No circumscribed abscess collection identified. 2. Free intraperitoneal air within the abdomen and pelvis indicating associated bowel perforation, likely micro perforation given the small amount of free air. 3. Cholelithiasis without evidence of acute cholecystitis. These results were called by telephone at the time of interpretation on 01/25/2019 at 6:25 pm to Dr. Jola Schmidt , who verbally acknowledged these results. Aortic Atherosclerosis (ICD10-I70.0). Electronically Signed   By: Franki Cabot M.D.   On: 01/25/2019 18:27     CBC Recent Labs  Lab 01/31/19 0350 02/01/19 0429 02/02/19 0907 02/03/19 0544 02/04/19 0433  WBC 13.7* 13.3* 14.6* 15.3* 12.7*  HGB 9.4* 9.7* 9.8* 9.7* 9.3*  HCT 30.2* 30.9* 31.9* 29.9* 29.2*  PLT 355 314 378 353 384  MCV 86.8 87.5 86.9 85.9 84.9  MCH 27.0 27.5 26.7 27.9 27.0  MCHC 31.1 31.4 30.7 32.4 31.8  RDW 13.4 13.8 13.6 13.6 13.4  LYMPHSABS 1.5  --   --   --   --   MONOABS 0.8  --   --   --   --   EOSABS 0.2  --   --   --   --   BASOSABS 0.1  --   --   --   --     Chemistries  Recent Labs  Lab 01/30/19 0830 01/31/19 0350 02/01/19 0429 02/02/19 1106 02/04/19 0433  NA 141 142 140 136 136  K 3.0* 2.9* 3.3* 3.8 3.5  CL 106 106 103 104 103  CO2 23 26 24 22 23   GLUCOSE 77 97 103* 101* 99  BUN 15 15 10 8  6*  CREATININE 0.75  0.82 0.61 0.65 0.63  CALCIUM 8.2* 8.3* 8.3* 8.7* 8.3*  MG  --   --  1.9  --   --   AST 16  --   --   --  38  ALT 14  --   --   --  25  ALKPHOS 105  --   --   --  104  BILITOT 1.1  --   --   --  0.6   ------------------------------------------------------------------------------------------------------------------ No results for input(s): CHOL, HDL, LDLCALC, TRIG, CHOLHDL, LDLDIRECT in the last 72 hours.  No results found for: HGBA1C ------------------------------------------------------------------------------------------------------------------ No results for input(s): TSH, T4TOTAL, T3FREE, THYROIDAB in the last 72 hours.  Invalid input(s): FREET3 ------------------------------------------------------------------------------------------------------------------ No results for input(s): VITAMINB12, FOLATE, FERRITIN, TIBC, IRON, RETICCTPCT in the last 72 hours.  Coagulation profile Recent Labs  Lab 02/04/19 0433  INR 1.2    No results for input(s): DDIMER in the last 72 hours.  Cardiac Enzymes No results for input(s): CKMB, TROPONINI, MYOGLOBIN in the last 168 hours.  Invalid input(s): CK ------------------------------------------------------------------------------------------------------------------ No results found for: BNP   Roxan Hockey M.D on 02/04/2019 at 5:00 PM  Go to [email or web address] - for contact info  [facility] - Office  [phone]

## 2019-02-05 DIAGNOSIS — K631 Perforation of intestine (nontraumatic): Secondary | ICD-10-CM

## 2019-02-05 LAB — CBC
HCT: 29.7 % — ABNORMAL LOW (ref 36.0–46.0)
Hemoglobin: 9.5 g/dL — ABNORMAL LOW (ref 12.0–15.0)
MCH: 27.3 pg (ref 26.0–34.0)
MCHC: 32 g/dL (ref 30.0–36.0)
MCV: 85.3 fL (ref 80.0–100.0)
Platelets: 420 10*3/uL — ABNORMAL HIGH (ref 150–400)
RBC: 3.48 MIL/uL — ABNORMAL LOW (ref 3.87–5.11)
RDW: 13.6 % (ref 11.5–15.5)
WBC: 12.5 10*3/uL — ABNORMAL HIGH (ref 4.0–10.5)
nRBC: 0 % (ref 0.0–0.2)

## 2019-02-05 MED ORDER — BOOST / RESOURCE BREEZE PO LIQD CUSTOM
1.0000 | Freq: Three times a day (TID) | ORAL | Status: DC
  Administered 2019-02-06 – 2019-02-07 (×5): 1 via ORAL
  Filled 2019-02-05: qty 1

## 2019-02-05 NOTE — Plan of Care (Signed)
Progressing towards discharge with diet

## 2019-02-05 NOTE — Progress Notes (Signed)
CC:  Abdominal pain  Subjective: She had some cramps last evening but they didn't last to long, no pain now or most of the day.  No pain on exam.   Objective: Vital signs in last 24 hours: Temp:  [98.3 F (36.8 C)-99 F (37.2 C)] 98.5 F (36.9 C) (04/22 0742) Pulse Rate:  [86-91] 91 (04/22 0742) Resp:  [16-18] 16 (04/22 0742) BP: (133-159)/(55-81) 138/55 (04/22 0742) SpO2:  [94 %-99 %] 95 % (04/22 0742) Last BM Date: 02/04/19 240 PO recorded 1100 IV Voided x 2 recorded Afebrile, VSS WBC 12.5 today   Intake/Output from previous day: 04/21 0701 - 04/22 0700 In: 1348.5 [P.O.:240; I.V.:840.7; IV Piggyback:267.7] Out: -  Intake/Output this shift: No intake/output data recorded.  General appearance: alert, cooperative and no distress Resp: clear to auscultation bilaterally GI: soft no tenderness on exam this AM.  + BS. Still having soft stools, but no urgency or diarrhea as before.    Lab Results:  Recent Labs    02/04/19 0433 02/05/19 0519  WBC 12.7* 12.5*  HGB 9.3* 9.5*  HCT 29.2* 29.7*  PLT 384 420*    BMET Recent Labs    02/02/19 1106 02/04/19 0433  NA 136 136  K 3.8 3.5  CL 104 103  CO2 22 23  GLUCOSE 101* 99  BUN 8 6*  CREATININE 0.65 0.63  CALCIUM 8.7* 8.3*   PT/INR Recent Labs    02/04/19 0433  LABPROT 15.5*  INR 1.2    Recent Labs  Lab 01/30/19 0830 02/04/19 0433  AST 16 38  ALT 14 25  ALKPHOS 105 104  BILITOT 1.1 0.6  PROT 5.6* 5.4*  ALBUMIN 2.3* 2.1*     Lipase     Component Value Date/Time   LIPASE 26 01/25/2019 1623     Medications: . enoxaparin (LOVENOX) injection  40 mg Subcutaneous Q24H  . feeding supplement  1 Container Oral TID BM  . feeding supplement (ENSURE ENLIVE)  237 mL Oral BID BM  . guaiFENesin  600 mg Oral BID  . hydroxychloroquine  200 mg Oral BID  . montelukast  10 mg Oral QHS  . ondansetron (ZOFRAN) IV  4 mg Intravenous Once  . pantoprazole  40 mg Oral Daily  . saccharomyces boulardii  250 mg  Oral BID   . sodium chloride Stopped (02/04/19 2308)  . dextrose 5 % and 0.45 % NaCl with KCl 40 mEq/L 30 mL/hr at 02/05/19 0100  . piperacillin-tazobactam (ZOSYN)  IV 12.5 mL/hr at 02/05/19 0100   Anti-infectives (From admission, onward)   Start     Dose/Rate Route Frequency Ordered Stop   01/26/19 1000  hydroxychloroquine (PLAQUENIL) tablet 200 mg     200 mg Oral 2 times daily 01/26/19 0041     01/26/19 0000  piperacillin-tazobactam (ZOSYN) IVPB 3.375 g  Status:  Discontinued     3.375 g 100 mL/hr over 30 Minutes Intravenous Every 6 hours 01/25/19 2138 01/25/19 2325   01/26/19 0000  piperacillin-tazobactam (ZOSYN) IVPB 3.375 g     3.375 g 12.5 mL/hr over 240 Minutes Intravenous Every 8 hours 01/25/19 2325     01/25/19 1845  piperacillin-tazobactam (ZOSYN) IVPB 3.375 g     3.375 g 100 mL/hr over 30 Minutes Intravenous  Once 01/25/19 1832 01/25/19 1927       Assessment/Plan Asthma Sjogren's syndrome   Sigmoid Diverticulitis with microperforation Colovesical fistula-seen on CT 4/11  - No indications for urgent or emergent surgical interventionat this time -CT  04/17 showed multiple small abscess - Urology following. Appreciate their input - Mobilize, IS - WBC 13.6(4/13)>>9.4(4/15)>>16.6(4/16)>>13.3(4/18)14.6(4/14)>>15.3(4/20) 12.7(4/21)>>12.5(4/22)   YNX:GZFP diet/IV fluids VTE: SCD's,Lovenox, moblize OI:PPGFQ 04/12>>day 11 Follow up:Dr. Dema Severin    Plan:  Continue Zosyn, soft diet, mobilization.  Recheck labs in AM.       LOS: 11 days    Meranda Dechaine 02/05/2019 [phone]

## 2019-02-05 NOTE — Progress Notes (Signed)
Patient stated IV was painful and requested it to be removed

## 2019-02-05 NOTE — Evaluation (Signed)
Physical Therapy Evaluation Patient Details Name: Denise Payne MRN: [id number] DOB: December 18, 1945 Today's Date: 02/05/2019   History of Present Illness  Pt is a 73 y/o female admitted secondary to abdominal pain; Sigmoid Diverticulitis with microperforation, Colovesical fistula-seen on CT 4/11. PMH including but not limited to asthma.    Clinical Impression  Pt presented supine in bed with HOB elevated, awake and willing to participate in therapy session. Prior to admission, pt reported that she was independent with all functional mobility and ADLs. Pt lives alone in a two level home with a level entry (bed/bath on main level). At the time of evaluation, pt was mod I to independent with all functional mobility. Pt reported that she feels she is at her baseline in regards to function. PT encouraged pt to ambulate 2-3 more times throughout the day. Pt agreeable. No further acute PT needs identified at this time. PT signing off.     Follow Up Recommendations No PT follow up    Equipment Recommendations  None recommended by PT    Recommendations for Other Services       Precautions / Restrictions Precautions Precautions: None Restrictions Weight Bearing Restrictions: No      Mobility  Bed Mobility Overal bed mobility: Independent                Transfers Overall transfer level: Independent                  Ambulation/Gait Ambulation/Gait assistance: Modified independent (Device/Increase time) Gait Distance (Feet): 300 Feet Assistive device: IV Pole Gait Pattern/deviations: Step-through pattern Gait velocity: WFL   General Gait Details: no LOB or physical assistance needed  Stairs            Wheelchair Mobility    Modified Rankin (Stroke Patients Only)       Balance Overall balance assessment: Needs assistance Sitting-balance support: Feet supported Sitting balance-Leahy Scale: Normal     Standing balance support: During functional  activity;No upper extremity supported Standing balance-Leahy Scale: Good                               Pertinent Vitals/Pain Pain Assessment: No/denies pain    Home Living Family/patient expects to be discharged to:: Private residence Living Arrangements: Alone Available Help at Discharge: Family;Available 24 hours/day Type of Home: House Home Access: Level entry     Home Layout: Able to live on main level with bedroom/bathroom Home Equipment: None      Prior Function Level of Independence: Independent               Hand Dominance        Extremity/Trunk Assessment   Upper Extremity Assessment Upper Extremity Assessment: Overall WFL for tasks assessed    Lower Extremity Assessment Lower Extremity Assessment: Overall WFL for tasks assessed    Cervical / Trunk Assessment Cervical / Trunk Assessment: Normal  Communication   Communication: No difficulties  Cognition Arousal/Alertness: Awake/alert Behavior During Therapy: WFL for tasks assessed/performed Overall Cognitive Status: Within Functional Limits for tasks assessed                                        General Comments      Exercises     Assessment/Plan    PT Assessment Patent does not need any further PT services  PT Problem  List         PT Treatment Interventions      PT Goals (Current goals can be found in the Care Plan section)  Acute Rehab PT Goals Patient Stated Goal: "go home" PT Goal Formulation: All assessment and education complete, DC therapy    Frequency     Barriers to discharge        Co-evaluation               AM-PAC PT "6 Clicks" Mobility  Outcome Measure Help needed turning from your back to your side while in a flat bed without using bedrails?: None Help needed moving from lying on your back to sitting on the side of a flat bed without using bedrails?: None Help needed moving to and from a bed to a chair (including a  wheelchair)?: None Help needed standing up from a chair using your arms (e.g., wheelchair or bedside chair)?: None Help needed to walk in hospital room?: None Help needed climbing 3-5 steps with a railing? : None 6 Click Score: 24    End of Session   Activity Tolerance: Patient tolerated treatment well Patient left: in bed;with call bell/phone within reach Nurse Communication: Mobility status PT Visit Diagnosis: Pain Pain - part of body: (abdomen)    Time: 0165-5374 PT Time Calculation (min) (ACUTE ONLY): 13 min   Charges:   PT Evaluation $PT Eval Low Complexity: [place], PT, DPT  Acute Rehabilitation Services Pager [phone] Office [place] 02/05/2019, 12:21 PM

## 2019-02-05 NOTE — Progress Notes (Signed)
PROGRESS NOTE  Denise Payne IOX:[id number] DOB: Aug 14, 1946 DOA: 01/25/2019 PCP: Leeroy Cha, MD  Brief History:  73 y.o.femaleretired RN with a history of persistent asthma and Sjogren's syndrome on plaquenil who presented to the ED with abdominal pain and intermittent pneumaturia for months. 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 CT also noted intraperitoneal free air suggesting microperforation.General surgery and urology consulted and patient admitted with zosyn given.  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 -continue zosyn per general surgery -switch to po abx when ok with surgery -per surgical team okay to advance diet to soft--mild cramping after postprandial -No further emesis frequency and consistency of stool appears to be improving, C. difficile was negative  FEN/Hypokalemia--intake is improving, potassium normalized,    Asthma: No acute exacerbation--- Continue singulair and prn neb  Sjogren syndrome: Stable, - Continue plaquenil.  Normocytic anemia,  -suspected to be due to chronic disease and Plaquenil use, anemia work-up reveals some degree of iron deficiency, ferritin is normal, serum iron is low at 14, iron saturation is low at 7, with a low TIBC of 268, T41 and folic acid are not low.  No evidence of ongoing blood loss or bleeding, hemoglobin is stable above 9  -start iron when stable  HTN:  -No BP medications PTA.  - Hydralazine IV prn -BP remains acceptable       Disposition Plan:   Home  when cleared by general surgery  Family Communication:   Family at bedside  Consultants:  General surgery  Code Status:  FULL  DVT Prophylaxis:  [place] Lovenox   Procedures: As Listed in Progress Note Above  Antibiotics: Zosyn 4/11>>>       Subjective: Pt has some mild abd cramping after eating.  Otherwise, abd pain is improving.  Patient denies fevers, chills, headache, chest pain, dyspnea, nausea, vomiting, diarrhea, abdominal pain, dysuria, hematuria, hematochezia, and melena.   Objective: Vitals:   02/05/19 0313 02/05/19 0742 02/05/19 1159 02/05/19 1551  BP: (!) 149/76 (!) 138/55 123/67 (!) 136/53  Pulse: 88 91 90 93  Resp: 18 16 16 18   Temp: 98.5 F (36.9 C) 98.5 F (36.9 C) 98 F (36.7 C) 98.5 F (36.9 C)  TempSrc: Oral Oral Oral Oral  SpO2: 96% 95% 94% 94%  Weight:      Height:        Intake/Output Summary (Last 24 hours) at 02/05/2019 1743 Last data filed at 02/05/2019 1204 Gross per 24 hour  Intake 1708.46 ml  Output --  Net 1708.46 ml   Weight change:  Exam:   General:  Pt is alert, follows commands appropriately, not in acute distress  HEENT: No icterus, No thrush, No neck mass, [place]/AT  Cardiovascular: RRR, S1/S2, no rubs, no gallops  Respiratory: CTA bilaterally, no wheezing, no crackles, no rhonchi  Abdomen: Soft/+BS, non tender, non distended, no guarding  Extremities: No edema, No lymphangitis, No petechiae, No rashes, no synovitis   Data Reviewed: I have personally reviewed following labs and imaging studies Basic Metabolic Panel: Recent Labs  Lab 01/30/19 0830 01/31/19 0350 02/01/19 0429 02/02/19 1106 02/04/19 0433  NA 141 142 140 136 136  K 3.0* 2.9* 3.3* 3.8 3.5  CL 106 106 103 104 103  CO2 23 26 24 22 23   GLUCOSE 77 97 103* 101* 99  BUN 15 15 10 8  6*  CREATININE 0.75 0.82 0.61 0.65 0.63  CALCIUM 8.2* 8.3* 8.3* 8.7* 8.3*  MG  --   --  1.9  --   --    Liver Function Tests: Recent Labs  Lab 01/30/19 0830 02/04/19 0433   AST 16 38  ALT 14 25  ALKPHOS 105 104  BILITOT 1.1 0.6  PROT 5.6* 5.4*  ALBUMIN 2.3* 2.1*   No results for input(s): LIPASE, AMYLASE in the last 168 hours. No results for input(s): AMMONIA in the last 168 hours. Coagulation Profile: Recent Labs  Lab 02/04/19 0433  INR 1.2   CBC: Recent Labs  Lab 01/31/19 0350 02/01/19 0429 02/02/19 0907 02/03/19 0544 02/04/19 0433 02/05/19 0519  WBC 13.7* 13.3* 14.6* 15.3* 12.7* 12.5*  NEUTROABS 10.8*  --   --   --   --   --   HGB 9.4* 9.7* 9.8* 9.7* 9.3* 9.5*  HCT 30.2* 30.9* 31.9* 29.9* 29.2* 29.7*  MCV 86.8 87.5 86.9 85.9 84.9 85.3  PLT 355 314 378 353 384 420*   Cardiac Enzymes: No results for input(s): CKTOTAL, CKMB, CKMBINDEX, TROPONINI in the last 168 hours. BNP: Invalid input(s): POCBNP CBG: No results for input(s): GLUCAP in the last 168 hours. HbA1C: No results for input(s): HGBA1C in the last 72 hours. Urine analysis:    Component Value Date/Time   COLORURINE AMBER (A) 01/25/2019 1823   APPEARANCEUR CLOUDY (A) 01/25/2019 1823   LABSPEC 1.025 01/25/2019 1823   PHURINE 6.0 01/25/2019 1823   GLUCOSEU NEGATIVE 01/25/2019 1823   HGBUR SMALL (A) 01/25/2019 1823   BILIRUBINUR NEGATIVE 01/25/2019 1823   KETONESUR 40 (A) 01/25/2019 1823   PROTEINUR 30 (A) 01/25/2019 1823   NITRITE POSITIVE (A) 01/25/2019 1823   LEUKOCYTESUR TRACE (A) 01/25/2019 1823   Sepsis Labs: @LABRCNTIP (procalcitonin:4,lacticidven:4) ) Recent Results (from the past 240 hour(s))  C difficile quick scan w PCR reflex     Status: None   Collection Time: 02/02/19  2:23 PM  Result Value Ref Range Status   C Diff antigen NEGATIVE NEGATIVE Final   C Diff toxin NEGATIVE NEGATIVE Final   C Diff interpretation No C. difficile detected.  Final    Comment: Performed at [facility], [place] [address] [place], [place] [address]     Scheduled Meds:  enoxaparin (LOVENOX) injection  40 mg Subcutaneous Q24H   feeding supplement  1 Container Oral TID  BM   feeding supplement  1 Container Oral TID BM   feeding supplement (ENSURE ENLIVE)  237 mL Oral BID BM   guaiFENesin  600 mg Oral BID   hydroxychloroquine  200 mg Oral BID   montelukast  10 mg Oral QHS   ondansetron (ZOFRAN) IV  4 mg Intravenous Once   pantoprazole  40 mg Oral Daily   saccharomyces boulardii  250 mg Oral BID   Continuous Infusions:  sodium chloride Stopped (02/04/19 2308)   dextrose 5 % and 0.45 % NaCl with KCl 40 mEq/L 30 mL/hr at 02/05/19 0100   piperacillin-tazobactam (ZOSYN)  IV 3.375 g (02/05/19 1645)    Procedures/Studies: Ct Abdomen Pelvis W Contrast  Result Date: 01/31/2019 CLINICAL DATA:  Follow-up diverticulitis EXAM: CT ABDOMEN AND PELVIS WITH CONTRAST TECHNIQUE: Multidetector CT imaging of the abdomen and pelvis was  performed using the standard protocol following bolus administration of intravenous contrast. CONTRAST:  1107mL OMNIPAQUE IOHEXOL 300 MG/ML  SOLN COMPARISON:  01/25/2019 FINDINGS: Lower chest: Moderate right right and small left pleural effusion with dependent atelectasis. Interlobular septal thickening at the lung bases likely represents a small amount of edema. Hepatobiliary: Cholelithiasis.  Liver is unremarkable. Pancreas: Unremarkable Spleen: Calcified granuloma.  Otherwise unremarkable. Adrenals/Urinary Tract: Kidneys are within normal limits. Adrenal glands are unremarkable. Bladder contains a small amount of gas. There is a 1.7 x 1.5 cm fluid and gas collection at the dome of the bladder of presenting either a large diverticulum or possibly a small abscess. These findings raise the possibility of fistula between sigmoid colon and bladder. Stomach/Bowel: There are inflammatory changes of the sigmoid colon adjacent to the dome of the bladder. This is characterized by wall thickening and stranding in the adjacent fat. Diverticulosis of the sigmoid and descending colon are noted. There is a gas and fluid collection inseparable from the  sigmoid colon and dome of the bladder representing either a large inflamed diverticulum or possibly a small abscess. This may indicate a fistula. Distended small bowel loops with air-fluid levels are present in the upper abdomen. Distal small bowel loops are decompressed. The transition point is in the inflammatory process within the pelvis. Vascular/Lymphatic: Atherosclerotic vascular calcifications. No abnormal retroperitoneal adenopathy. Several prominent but non pathologically enlarged mesenteric nodes are noted and are likely inflammatory in nature. Reproductive: Uterus is absent. Other: There is stranding and inflammatory changes within the anterior pelvic fat adjacent to the area of diverticulitis. Multiple very small gas and fluid filled abscesses are present in the lower abdomen and upper pelvis. The largest measures 3.6 x 1.5 cm. A loop of bowel is seen anterior to this fluid collection. Small amount of free fluid about the liver. Musculoskeletal: No vertebral compression deformity. IMPRESSION: Findings are again consistent with acute sigmoid diverticulitis. There are findings worrisome for a fistula between the sigmoid colon and bladder as described above. There are gas and fluid filled collections in the lower abdomen and upper pelvis compatible with multiple small abscesses. The largest is 3.6 x 1.5 cm. It is posterior to a loop of bowel and safe access for percutaneous drainage is not possible. Partial small bowel obstruction pattern. The transition point is in the pelvis at the site of the inflammatory process. Cholelithiasis. Bilateral pleural effusions. Electronically Signed   By: Marybelle Killings M.D.   On: 01/31/2019 11:51   Ct Abdomen Pelvis W Contrast  Result Date: 01/25/2019 CLINICAL DATA:  Diffuse abdominal pain. History of GERD, nausea. EXAM: CT ABDOMEN AND PELVIS WITH CONTRAST TECHNIQUE: Multidetector CT imaging of the abdomen and pelvis was performed using the standard protocol following  bolus administration of intravenous contrast. CONTRAST:  177mL OMNIPAQUE IOHEXOL 300 MG/ML  SOLN COMPARISON:  None. FINDINGS: Lower chest: Mild bibasilar scarring/atelectasis. Hepatobiliary: Numerous stones within the a otherwise unremarkable gallbladder. No focal liver abnormality. No significant bile duct dilatation seen. Pancreas: Unremarkable. No pancreatic ductal dilatation or surrounding inflammatory changes. Spleen: Normal in size without focal abnormality. Adrenals/Urinary Tract: Adrenal glands appear normal. Kidneys are unremarkable without mass, stone or hydronephrosis. No perinephric fluid. Air within the bladder. Stomach/Bowel: Extensive diverticulosis throughout the colon. Inflammation/fluid stranding is seen about the sigmoid colon indicating acute diverticulitis. The inflamed segment of bowel is contiguous with the anterior-superior margin of the bladder concerning for fistula. No dilated large or small bowel loops. Stomach is unremarkable, decompressed. Appendix is normal. Vascular/Lymphatic: Aortic atherosclerosis. No  acute appearing vascular abnormality. Numerous small lymph nodes within the lower mesentery, likely reactive in nature. Reproductive: Presumed hysterectomy. No adnexal mass. Other: Small amount of free intraperitoneal air within the central abdomen and upper abdomen. No abscess collection seen. Musculoskeletal: No acute or suspicious osseous finding. IMPRESSION: 1. Diverticulitis of the sigmoid colon. The inflamed segment of sigmoid colon is contiguous with the anterior-superior margin of the bladder indicating colovesical fistula, with associated air in the bladder. No circumscribed abscess collection identified. 2. Free intraperitoneal air within the abdomen and pelvis indicating associated bowel perforation, likely micro perforation given the small amount of free air. 3. Cholelithiasis without evidence of acute cholecystitis. These results were called by telephone at the time of  interpretation on 01/25/2019 at 6:25 pm to Dr. Jola Schmidt , who verbally acknowledged these results. Aortic Atherosclerosis (ICD10-I70.0). Electronically Signed   By: Franki Cabot M.D.   On: 01/25/2019 18:27    Orson Eva, DO  [facility] Pager [phone]  If 7PM-7AM, please contact night-coverage [email or web address] Password [facility] 02/05/2019, 5:43 PM   LOS: 11 days

## 2019-02-05 NOTE — Care Management Important Message (Signed)
Important Message  Patient Details  Name: Denise Payne MRN: [id number] Date of Birth: 1946-01-28   Medicare Important Message Given:  Yes    Jayleen Scaglione Montine Circle 02/05/2019, 2:51 PM

## 2019-02-06 LAB — BASIC METABOLIC PANEL
Anion gap: 10 (ref 5–15)
BUN: 6 mg/dL — ABNORMAL LOW (ref 8–23)
CO2: 23 mmol/L (ref 22–32)
Calcium: 9.1 mg/dL (ref 8.9–10.3)
Chloride: 107 mmol/L (ref 98–111)
Creatinine, Ser: 0.85 mg/dL (ref 0.44–1.00)
GFR calc Af Amer: >60 mL/min (ref >60)
GFR calc non Af Amer: >60 mL/min (ref >60)
Glucose, Bld: 114 mg/dL — ABNORMAL HIGH (ref 70–99)
Potassium: 3.4 mmol/L — ABNORMAL LOW (ref 3.5–5.1)
Sodium: 140 mmol/L (ref 135–145)

## 2019-02-06 LAB — MAGNESIUM: Magnesium: 1.7 mg/dL (ref 1.7–2.4)

## 2019-02-06 LAB — CBC
HCT: 33.1 % — ABNORMAL LOW (ref 36.0–46.0)
Hemoglobin: 10.7 g/dL — ABNORMAL LOW (ref 12.0–15.0)
MCH: 28.1 pg (ref 26.0–34.0)
MCHC: 32.3 g/dL (ref 30.0–36.0)
MCV: 86.9 fL (ref 80.0–100.0)
Platelets: 502 10*3/uL — ABNORMAL HIGH (ref 150–400)
RBC: 3.81 MIL/uL — ABNORMAL LOW (ref 3.87–5.11)
RDW: 13.8 % (ref 11.5–15.5)
WBC: 12.6 10*3/uL — ABNORMAL HIGH (ref 4.0–10.5)
nRBC: 0 % (ref 0.0–0.2)

## 2019-02-06 MED ORDER — MAGNESIUM SULFATE 2 GM/50ML IV SOLN
2.0000 g | Freq: Once | INTRAVENOUS | Status: AC
  Administered 2019-02-06: 16:00:00 2 g via INTRAVENOUS
  Filled 2019-02-06 (×2): qty 50

## 2019-02-06 MED ORDER — POTASSIUM CHLORIDE CRYS ER 20 MEQ PO TBCR
20.0000 meq | EXTENDED_RELEASE_TABLET | Freq: Once | ORAL | Status: DC
  Filled 2019-02-06: qty 1

## 2019-02-06 MED ORDER — AMOXICILLIN-POT CLAVULANATE 875-125 MG PO TABS
1.0000 | ORAL_TABLET | Freq: Two times a day (BID) | ORAL | Status: DC
  Administered 2019-02-06 – 2019-02-08 (×4): 1 via ORAL
  Filled 2019-02-06 (×4): qty 1

## 2019-02-06 NOTE — Progress Notes (Signed)
Nutrition Follow-up   RD working remotely.  DOCUMENTATION CODES:   Obesity unspecified  INTERVENTION:  Continue Boost Breeze po TID, each supplement provides 250 kcal and 9 grams of protein  Discontinue Ensure due to poor pt acceptance.  Encourage adequate PO intake.   NUTRITION DIAGNOSIS:   Inadequate oral intake related to inability to eat as evidenced by NPO status; diet advanced; improving  GOAL:   Patient will meet greater than or equal to 90% of their needs; progressing  MONITOR:   PO intake, Supplement acceptance, Labs, Diet advancement, Weight trends, I & O's, Skin  REASON FOR ASSESSMENT:   NPO/Clear Liquid Diet    ASSESSMENT:   73 y.o. female with medical history significant for asthma and Sjogren syndrome, presenting to emergency department for evaluation of abdominal pain. CT the abdomen and pelvis is concerning for diverticulitis of the sigmoid colon with the inflamed segment contiguous with the anterior-superior margin of the urinary bladder indicating a colovesical fistula with associated air in the bladder..  Per MD, no indications for urgent emergent surgical intervention at this time regarding diverticulitis with colovesical fistula. Pt is currently on a soft diet. Meal completion has been 50-75%. Pt currently has Boost Breeze and has been consuming them. Pt has been refusing Ensure. RD to discontinue Ensure due to poor pt acceptance. Pt encouraged to eat her food at meals and to drink her supplements. Labs and medications reviewed.   Diet Order:   Diet Order            DIET SOFT Room service appropriate? Yes; Fluid consistency: Thin  Diet effective now              EDUCATION NEEDS:   Not appropriate for education at this time  Skin:  Skin Assessment: Reviewed RN Assessment  Last BM:  4/21  Height:   Ht Readings from Last 1 Encounters:  01/25/19 5' 2.5" (1.588 m)    Weight:   Wt Readings from Last 1 Encounters:  01/25/19 81.6 kg     Ideal Body Weight:  51 kg  BMI:  Body mass index is 32.4 kg/m.  Estimated Nutritional Needs:   Kcal:  1650-1850  Protein:  85-95 grams  Fluid:  1.7 -1.9 L/day    Corrin Parker, MS, RD, LDN Pager # [phone] After hours/ weekend pager # [phone]

## 2019-02-06 NOTE — Progress Notes (Addendum)
    CC:  Abdominal pain  Subjective: She had some cramps again last PM.  Cramps got better with BM last PM.  Better this AM and no pain this AM.  She is getting anxious about going home.  She has been here 12 days.  Objective: Vital signs in last 24 hours: Temp:  [98 F (36.7 C)-98.8 F (37.1 C)] (P) 98.3 F (36.8 C) (04/23 0758) Pulse Rate:  [81-93] (P) 83 (04/23 0758) Resp:  [16-18] (P) 16 (04/23 0758) BP: (123-138)/(40-70) (P) 141/59 (04/23 0758) SpO2:  [93 %-97 %] (P) 93 % (04/23 0758) Last BM Date: 02/04/19 720 PO 360 IV Urine x 3 Stool x 0 recorded Afebrile, VSS WBC still up 12.6 today  Intake/Output from previous day: 04/22 0701 - 04/23 0700 In: 1142.5 [P.O.:720; I.V.:360; IV Piggyback:62.5] Out: -  Intake/Output this shift: No intake/output data recorded.  General appearance: alert, cooperative and no distress Resp: clear to auscultation bilaterally GI: soft, non-tender; bowel sounds normal; no masses,  no organomegaly  Lab Results:  Recent Labs    02/05/19 0519 02/06/19 0952  WBC 12.5* 12.6*  HGB 9.5* 10.7*  HCT 29.7* 33.1*  PLT 420* 502*    BMET Recent Labs    02/04/19 0433 02/06/19 0952  NA 136 140  K 3.5 3.4*  CL 103 107  CO2 23 23  GLUCOSE 99 114*  BUN 6* 6*  CREATININE 0.63 0.85  CALCIUM 8.3* 9.1   PT/INR Recent Labs    02/04/19 0433  LABPROT 15.5*  INR 1.2    Recent Labs  Lab 02/04/19 0433  AST 38  ALT 25  ALKPHOS 104  BILITOT 0.6  PROT 5.4*  ALBUMIN 2.1*     Lipase     Component Value Date/Time   LIPASE 26 01/25/2019 1623     Medications: . enoxaparin (LOVENOX) injection  40 mg Subcutaneous Q24H  . feeding supplement  1 Container Oral TID BM  . feeding supplement  1 Container Oral TID BM  . feeding supplement (ENSURE ENLIVE)  237 mL Oral BID BM  . guaiFENesin  600 mg Oral BID  . hydroxychloroquine  200 mg Oral BID  . montelukast  10 mg Oral QHS  . ondansetron (ZOFRAN) IV  4 mg Intravenous Once  .  pantoprazole  40 mg Oral Daily  . saccharomyces boulardii  250 mg Oral BID    Assessment/Plan Asthma Sjogren's syndrome   Sigmoid Diverticulitis with microperforation Colovesical fistula-seen on CT 4/11  - No indications for urgent or emergent surgical interventionat this time -CT 04/17 showed multiple small abscess - Urology following. Appreciate their input - Mobilize, IS - WBC 13.6(4/13)>>9.4(4/15)>>16.6(4/16)>>13.3(4/18)14.6(4/14)>>15.3(4/20) 12.7(4/21)>>12.5(4/22)>>12.6(4/23)   VHQ:IONG diet/IV fluids VTE: SCD's,Lovenox, moblize EX:BMWUX 04/12>>day 12 Follow up:Dr. Dema Severin    Plan:  Day 12 Zosyn, not symptomatic right now aside from some cramping last PM. WBC remains mildly elevated 12.7(4/21)>>12.5(4/22)>>12.6(4/23).  Will start Augmentin and recheck labs in AM.  If she is not better, may need to repeat CT scan.  Her last CT was 01/31/19.   LOS: 12 days    Jaquay Posthumus 02/06/2019 [phone]

## 2019-02-06 NOTE — Progress Notes (Signed)
PHARMACIST - PHYSICIAN COMMUNICATION  CONCERNING:  Zosyn   RECOMMENDATION: DC?  Change to po antibiotic / or de-escalate to Ceftriaxone?  DESCRIPTION: Zosyn since 4/12   Thank you Anette Guarneri, PharmD

## 2019-02-06 NOTE — Progress Notes (Signed)
PROGRESS NOTE  Denise Payne TML:[id number] DOB: 1946-07-24 DOA: 01/25/2019 PCP: Leeroy Cha, MD Brief History:  73 y.o.femaleretired RN with a history of persistent asthma and Sjogren's syndrome on plaquenil who presented to the ED with abdominal pain and intermittent pneumaturia for months. 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 CT also noted intraperitoneal free air suggesting microperforation.General surgery and urology consulted and patient admitted with zosyn given.  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 -continue zosyn per general surgery>>>amox/clav on 4/23 -per surgical team okay to advance diet to soft--mild cramping postprandial -No further emesis frequency and consistency of stool appears to be improving,C. difficile was negative -repeat CT abd if no improvement  FEN/Hypokalemia--intake is improving -replete K -check mag--1.7  Asthma: No acute exacerbation--- Continue singulair and prn neb  Sjogren syndrome: Stable, - Continue plaquenil.  Normocytic anemia,  -suspected to be due to chronic disease and Plaquenil use, anemia work-up reveals some degree of iron deficiency, ferritin is normal, serum iron is low at 14, iron saturation is low at 7, with a low TIBC of 681, E75 and folic acid are not low.  -No evidence of ongoing blood loss or bleeding, hemoglobin is stable above 9  -start iron when stable  HTN:  -No BP medications PTA.  - Hydralazine IV prn -BP remains acceptable       Disposition Plan:   Home when cleared by general surgery  Family  Communication:   Family at bedside  Consultants:  General surgery  Code Status:  FULL  DVT Prophylaxis:  [place] Lovenox   Procedures: As Listed in Progress Note Above  Antibiotics: Zosyn 4/11>>>     Subjective: Pt has postprandial abd cramping, but is slowly improving.  Denies f/c cp, sob, n/v/d, hematochezia, melena  Objective: Vitals:   02/06/19 0337 02/06/19 0758 02/06/19 1135 02/06/19 1544  BP: (!) 130/55 (!) 141/59 134/66 137/64  Pulse: 81 83 80 90  Resp: 17 16 16 16   Temp: 98.6 F (37 C) 98.3 F (36.8 C) 98.5 F (36.9 C) 98.6 F (37 C)  TempSrc: Oral Oral Oral Oral  SpO2: 95% 93% 98% 99%  Weight:      Height:        Intake/Output Summary (Last 24 hours) at 02/06/2019 1606 Last data filed at 02/06/2019 1301 Gross per 24 hour  Intake 1262.47 ml  Output --  Net 1262.47 ml   Weight change:  Exam:   General:  Pt is alert, follows commands appropriately, not in acute distress  HEENT: No icterus, No thrush, No neck mass, [place]/AT  Cardiovascular: RRR, S1/S2, no rubs, no gallops  Respiratory: CTA bilaterally, no wheezing, no crackles, no rhonchi  Abdomen: Soft/+BS, non tender, non distended, no guarding  Extremities: No edema, No lymphangitis, No petechiae, No rashes, no synovitis   Data Reviewed: I have personally reviewed following labs and imaging studies Basic Metabolic Panel: Recent Labs  Lab 01/31/19 0350 02/01/19 0429 02/02/19 1106 02/04/19 0433 02/06/19 0403 02/06/19 0952  NA 142 140 136 136  --  140  K 2.9* 3.3* 3.8 3.5  --  3.4*  CL 106 103 104 103  --  107  CO2 26 24 22  23  --  23  GLUCOSE 97 103* 101* 99  --  114*  BUN 15 10 8  6*  --  6*  CREATININE 0.82 0.61 0.65 0.63  --  0.85  CALCIUM 8.3* 8.3* 8.7* 8.3*  --  9.1  MG  --  1.9  --   --  1.7  --    Liver Function Tests: Recent Labs  Lab 02/04/19 0433  AST 38  ALT 25  ALKPHOS 104  BILITOT 0.6  PROT 5.4*  ALBUMIN 2.1*   No results for input(s): LIPASE, AMYLASE in the  last 168 hours. No results for input(s): AMMONIA in the last 168 hours. Coagulation Profile: Recent Labs  Lab 02/04/19 0433  INR 1.2   CBC: Recent Labs  Lab 01/31/19 0350  02/02/19 0907 02/03/19 0544 02/04/19 0433 02/05/19 0519 02/06/19 0952  WBC 13.7*   < > 14.6* 15.3* 12.7* 12.5* 12.6*  NEUTROABS 10.8*  --   --   --   --   --   --   HGB 9.4*   < > 9.8* 9.7* 9.3* 9.5* 10.7*  HCT 30.2*   < > 31.9* 29.9* 29.2* 29.7* 33.1*  MCV 86.8   < > 86.9 85.9 84.9 85.3 86.9  PLT 355   < > 378 353 384 420* 502*   < > = values in this interval not displayed.   Cardiac Enzymes: No results for input(s): CKTOTAL, CKMB, CKMBINDEX, TROPONINI in the last 168 hours. BNP: Invalid input(s): POCBNP CBG: No results for input(s): GLUCAP in the last 168 hours. HbA1C: No results for input(s): HGBA1C in the last 72 hours. Urine analysis:    Component Value Date/Time   COLORURINE AMBER (A) 01/25/2019 1823   APPEARANCEUR CLOUDY (A) 01/25/2019 1823   LABSPEC 1.025 01/25/2019 1823   PHURINE 6.0 01/25/2019 1823   GLUCOSEU NEGATIVE 01/25/2019 1823   HGBUR SMALL (A) 01/25/2019 1823   BILIRUBINUR NEGATIVE 01/25/2019 1823   KETONESUR 40 (A) 01/25/2019 1823   PROTEINUR 30 (A) 01/25/2019 1823   NITRITE POSITIVE (A) 01/25/2019 1823   LEUKOCYTESUR TRACE (A) 01/25/2019 1823   Sepsis Labs: @LABRCNTIP (procalcitonin:4,lacticidven:4) ) Recent Results (from the past 240 hour(s))  C difficile quick scan w PCR reflex     Status: None   Collection Time: 02/02/19  2:23 PM  Result Value Ref Range Status   C Diff antigen NEGATIVE NEGATIVE Final   C Diff toxin NEGATIVE NEGATIVE Final   C Diff interpretation No C. difficile detected.  Final    Comment: Performed at [facility] [address], [place], [place] [address]     Scheduled Meds:  amoxicillin-clavulanate  1 tablet Oral BID WC   enoxaparin (LOVENOX) injection  40 mg Subcutaneous Q24H   feeding supplement  1 Container Oral TID BM    guaiFENesin  600 mg Oral BID   hydroxychloroquine  200 mg Oral BID   montelukast  10 mg Oral QHS   ondansetron (ZOFRAN) IV  4 mg Intravenous Once   pantoprazole  40 mg Oral Daily   saccharomyces boulardii  250 mg Oral BID   Continuous Infusions:  sodium chloride Stopped (02/04/19 2308)   dextrose 5 % and 0.45 % NaCl with KCl 40 mEq/L 30 mL/hr at 02/06/19 0459   magnesium sulfate 1 - 4 g bolus IVPB 2 g (02/06/19 1542)    Procedures/Studies: Ct Abdomen Pelvis W Contrast  Result Date: 01/31/2019 CLINICAL DATA:  Follow-up diverticulitis EXAM: CT ABDOMEN AND PELVIS WITH CONTRAST TECHNIQUE:  Multidetector CT imaging of the abdomen and pelvis was performed using the standard protocol following bolus administration of intravenous contrast. CONTRAST:  171mL OMNIPAQUE IOHEXOL 300 MG/ML  SOLN COMPARISON:  01/25/2019 FINDINGS: Lower chest: Moderate right right and small left pleural effusion with dependent atelectasis. Interlobular septal thickening at the lung bases likely represents a small amount of edema. Hepatobiliary: Cholelithiasis.  Liver is unremarkable. Pancreas: Unremarkable Spleen: Calcified granuloma.  Otherwise unremarkable. Adrenals/Urinary Tract: Kidneys are within normal limits. Adrenal glands are unremarkable. Bladder contains a small amount of gas. There is a 1.7 x 1.5 cm fluid and gas collection at the dome of the bladder of presenting either a large diverticulum or possibly a small abscess. These findings raise the possibility of fistula between sigmoid colon and bladder. Stomach/Bowel: There are inflammatory changes of the sigmoid colon adjacent to the dome of the bladder. This is characterized by wall thickening and stranding in the adjacent fat. Diverticulosis of the sigmoid and descending colon are noted. There is a gas and fluid collection inseparable from the sigmoid colon and dome of the bladder representing either a large inflamed diverticulum or possibly a small abscess. This  may indicate a fistula. Distended small bowel loops with air-fluid levels are present in the upper abdomen. Distal small bowel loops are decompressed. The transition point is in the inflammatory process within the pelvis. Vascular/Lymphatic: Atherosclerotic vascular calcifications. No abnormal retroperitoneal adenopathy. Several prominent but non pathologically enlarged mesenteric nodes are noted and are likely inflammatory in nature. Reproductive: Uterus is absent. Other: There is stranding and inflammatory changes within the anterior pelvic fat adjacent to the area of diverticulitis. Multiple very small gas and fluid filled abscesses are present in the lower abdomen and upper pelvis. The largest measures 3.6 x 1.5 cm. A loop of bowel is seen anterior to this fluid collection. Small amount of free fluid about the liver. Musculoskeletal: No vertebral compression deformity. IMPRESSION: Findings are again consistent with acute sigmoid diverticulitis. There are findings worrisome for a fistula between the sigmoid colon and bladder as described above. There are gas and fluid filled collections in the lower abdomen and upper pelvis compatible with multiple small abscesses. The largest is 3.6 x 1.5 cm. It is posterior to a loop of bowel and safe access for percutaneous drainage is not possible. Partial small bowel obstruction pattern. The transition point is in the pelvis at the site of the inflammatory process. Cholelithiasis. Bilateral pleural effusions. Electronically Signed   By: Marybelle Killings M.D.   On: 01/31/2019 11:51   Ct Abdomen Pelvis W Contrast  Result Date: 01/25/2019 CLINICAL DATA:  Diffuse abdominal pain. History of GERD, nausea. EXAM: CT ABDOMEN AND PELVIS WITH CONTRAST TECHNIQUE: Multidetector CT imaging of the abdomen and pelvis was performed using the standard protocol following bolus administration of intravenous contrast. CONTRAST:  163mL OMNIPAQUE IOHEXOL 300 MG/ML  SOLN COMPARISON:  None.  FINDINGS: Lower chest: Mild bibasilar scarring/atelectasis. Hepatobiliary: Numerous stones within the a otherwise unremarkable gallbladder. No focal liver abnormality. No significant bile duct dilatation seen. Pancreas: Unremarkable. No pancreatic ductal dilatation or surrounding inflammatory changes. Spleen: Normal in size without focal abnormality. Adrenals/Urinary Tract: Adrenal glands appear normal. Kidneys are unremarkable without mass, stone or hydronephrosis. No perinephric fluid. Air within the bladder. Stomach/Bowel: Extensive diverticulosis throughout the colon. Inflammation/fluid stranding is seen about the sigmoid colon indicating acute diverticulitis. The inflamed segment of bowel is contiguous with the anterior-superior margin of the bladder concerning for fistula. No dilated large or small bowel loops. Stomach is  unremarkable, decompressed. Appendix is normal. Vascular/Lymphatic: Aortic atherosclerosis. No acute appearing vascular abnormality. Numerous small lymph nodes within the lower mesentery, likely reactive in nature. Reproductive: Presumed hysterectomy. No adnexal mass. Other: Small amount of free intraperitoneal air within the central abdomen and upper abdomen. No abscess collection seen. Musculoskeletal: No acute or suspicious osseous finding. IMPRESSION: 1. Diverticulitis of the sigmoid colon. The inflamed segment of sigmoid colon is contiguous with the anterior-superior margin of the bladder indicating colovesical fistula, with associated air in the bladder. No circumscribed abscess collection identified. 2. Free intraperitoneal air within the abdomen and pelvis indicating associated bowel perforation, likely micro perforation given the small amount of free air. 3. Cholelithiasis without evidence of acute cholecystitis. These results were called by telephone at the time of interpretation on 01/25/2019 at 6:25 pm to Dr. Jola Schmidt , who verbally acknowledged these results. Aortic  Atherosclerosis (ICD10-I70.0). Electronically Signed   By: Franki Cabot M.D.   On: 01/25/2019 18:27    Orson Eva, DO  [facility] Pager [phone]  If 7PM-7AM, please contact night-coverage [email or web address] Password [email or web address] 02/06/2019, 4:06 PM   LOS: 12 days

## 2019-02-07 ENCOUNTER — Inpatient Hospital Stay (HOSPITAL_COMMUNITY): Payer: Medicare Other

## 2019-02-07 LAB — BASIC METABOLIC PANEL
Anion gap: 10 (ref 5–15)
BUN: 5 mg/dL — ABNORMAL LOW (ref 8–23)
CO2: 24 mmol/L (ref 22–32)
Calcium: 8.9 mg/dL (ref 8.9–10.3)
Chloride: 104 mmol/L (ref 98–111)
Creatinine, Ser: 0.74 mg/dL (ref 0.44–1.00)
GFR calc Af Amer: >60 mL/min (ref >60)
GFR calc non Af Amer: >60 mL/min (ref >60)
Glucose, Bld: 100 mg/dL — ABNORMAL HIGH (ref 70–99)
Potassium: 4.1 mmol/L (ref 3.5–5.1)
Sodium: 138 mmol/L (ref 135–145)

## 2019-02-07 LAB — CBC
HCT: 30.6 % — ABNORMAL LOW (ref 36.0–46.0)
Hemoglobin: 9.7 g/dL — ABNORMAL LOW (ref 12.0–15.0)
MCH: 27.3 pg (ref 26.0–34.0)
MCHC: 31.7 g/dL (ref 30.0–36.0)
MCV: 86.2 fL (ref 80.0–100.0)
Platelets: 546 10*3/uL — ABNORMAL HIGH (ref 150–400)
RBC: 3.55 MIL/uL — ABNORMAL LOW (ref 3.87–5.11)
RDW: 13.7 % (ref 11.5–15.5)
WBC: 13.5 10*3/uL — ABNORMAL HIGH (ref 4.0–10.5)
nRBC: 0 % (ref 0.0–0.2)

## 2019-02-07 LAB — MAGNESIUM: Magnesium: 2.3 mg/dL (ref 1.7–2.4)

## 2019-02-07 IMAGING — CT CT ABDOMEN AND PELVIS WITH CONTRAST
2 of 5 series · 16 of 46 positions shown, 18 images · IV contrast (APPLIED)
Comparison: CT abdomen pelvis dated [DATE].

CLINICAL DATA: Diverticular abscess follow-up.

EXAM:
CT ABDOMEN AND PELVIS WITH CONTRAST
TECHNIQUE: Multidetector CT imaging of the abdomen and pelvis was performed
using the standard protocol following bolus administration of
intravenous contrast.
CONTRAST:  100mL [1G] IOPAMIDOL ([1G]) INJECTION 61%

[Series 3: abdomen 5.0 · axial · 0.84mm/px · z∈[-310,+70]mm · 13 of 88 slices shown, 15 images]
[im 6/88  soft-tissue]
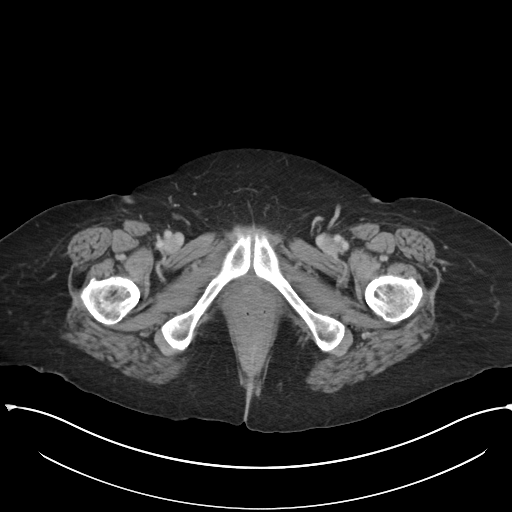
[im 6/88  bone]
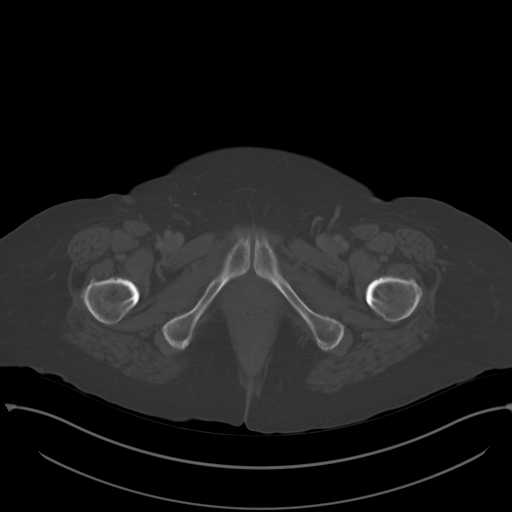
[im 11/88  soft-tissue]
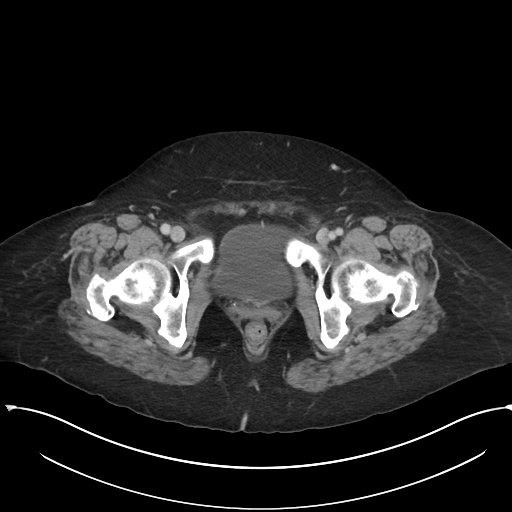
[im 21/88  soft-tissue]
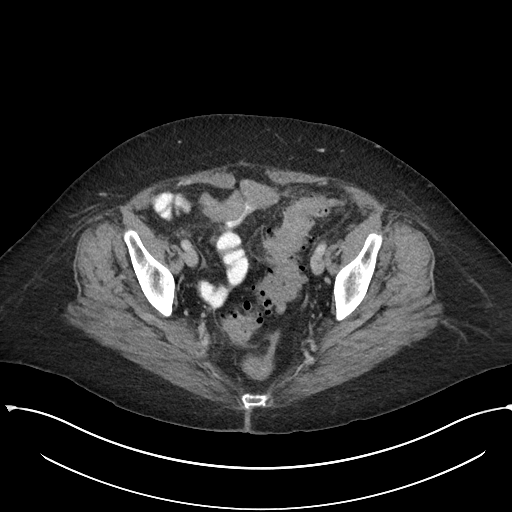
[im 26/88  soft-tissue]
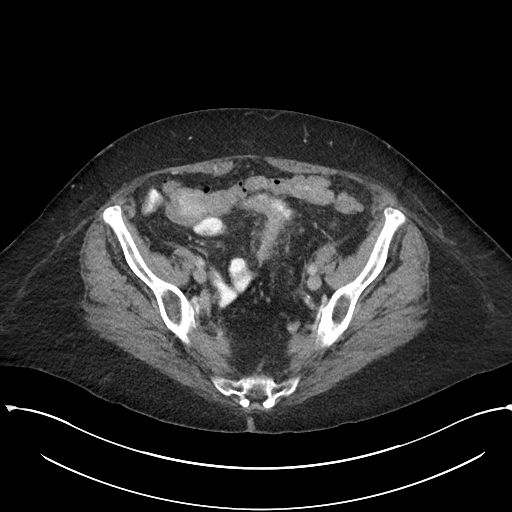
[im 31/88  soft-tissue]
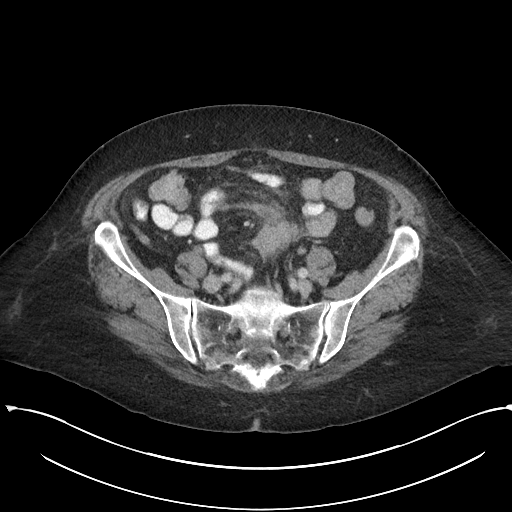
[im 36/88  soft-tissue]
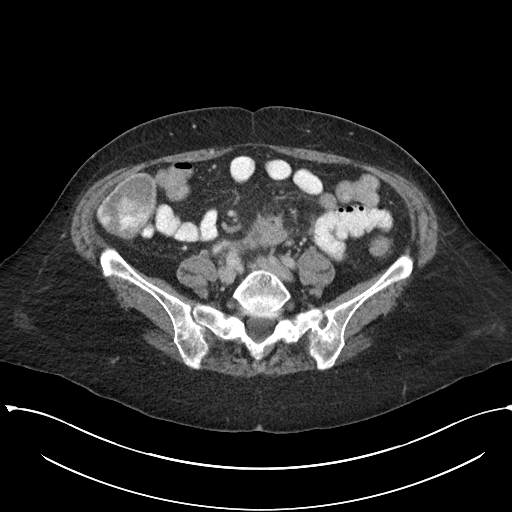
[im 47/88  soft-tissue]
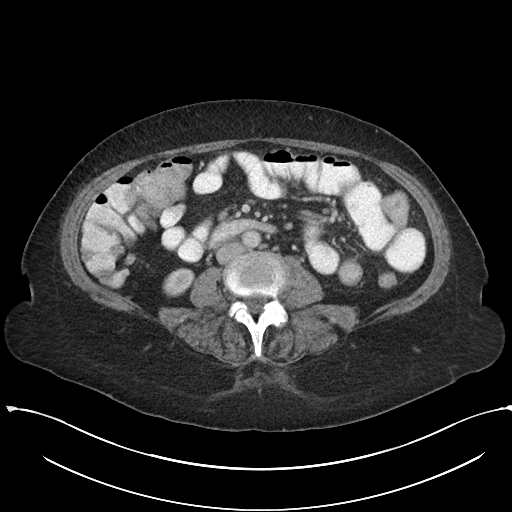
[im 52/88  soft-tissue]
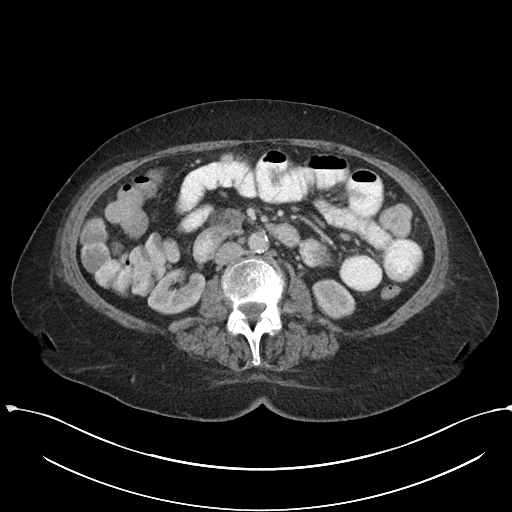
[im 57/88  soft-tissue]
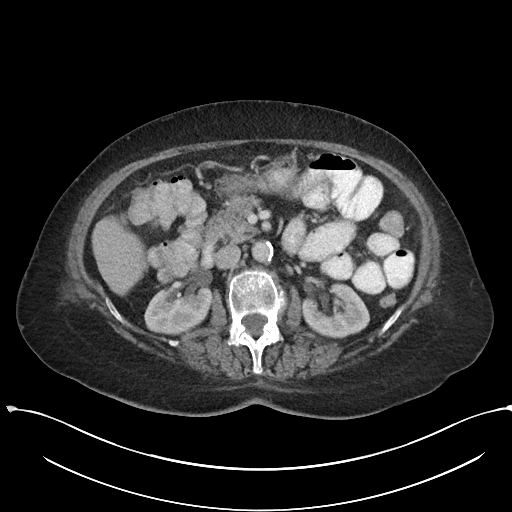
[im 57/88  bone]
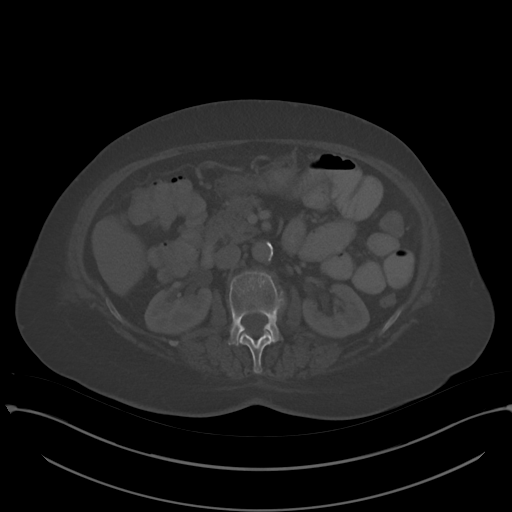
[im 62/88  soft-tissue]
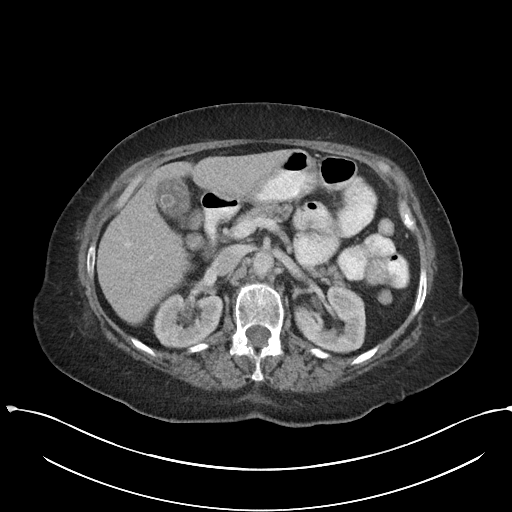
[im 67/88  soft-tissue]
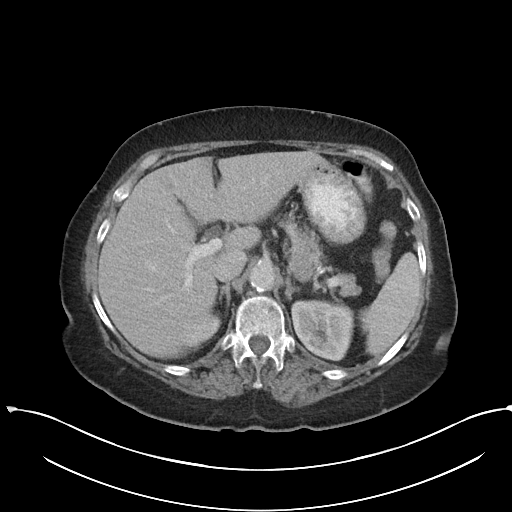
[im 77/88  soft-tissue]
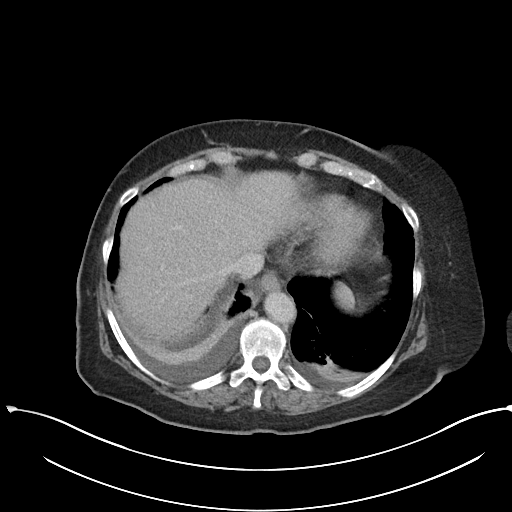
[im 82/88  soft-tissue]
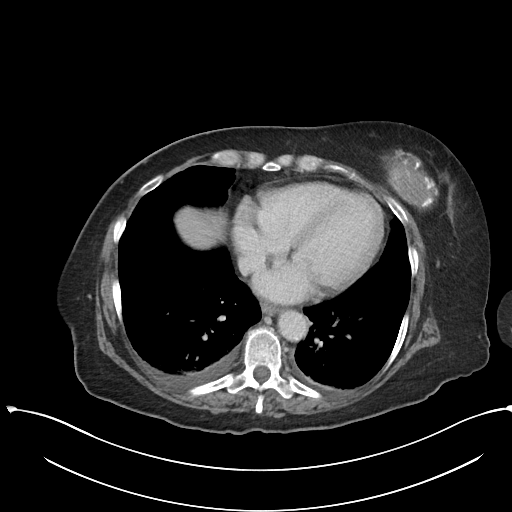

[Series 6: abdomen 3.0 mpr cor · coronal · 0.97mm/px · 3 of 101 slices shown]
[im 34/101  soft-tissue]
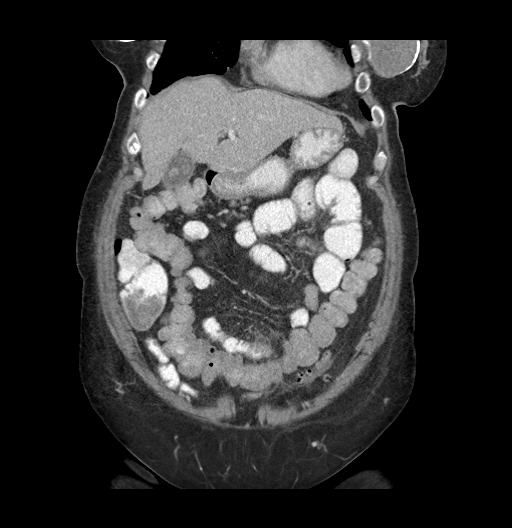
[im 45/101  soft-tissue]
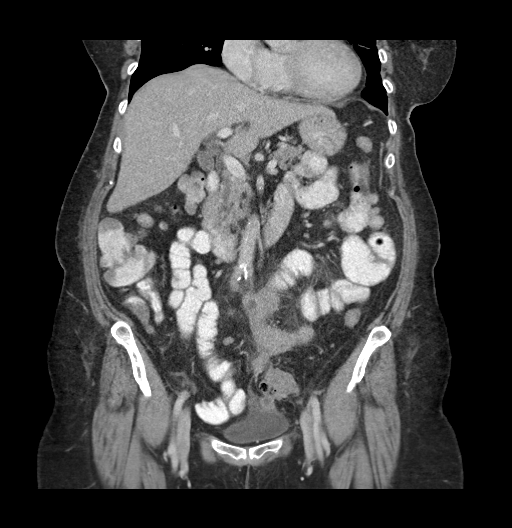
[im 56/101  soft-tissue]
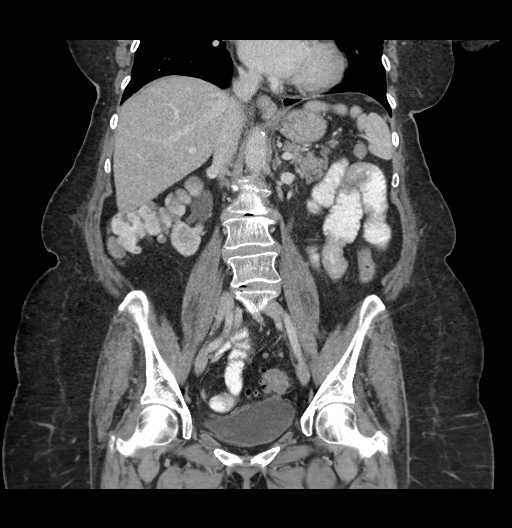

[16 of 46 positions shown; findings below may reference images not displayed]

FINDINGS: Lower chest: Improving now small right and trace left pleural
effusions and bilateral lower lobe subsegmental atelectasis.

Hepatobiliary: No focal liver abnormality. Unchanged cholelithiasis.
No gallbladder wall thickening or biliary dilatation.

Pancreas: Unremarkable. No pancreatic ductal dilatation or
surrounding inflammatory changes.

Spleen: Normal in size without focal abnormality.

Adrenals/Urinary Tract: Adrenal glands are unremarkable. Kidneys are
normal, without renal calculi, focal lesion, or hydronephrosis. Rim
enhancing fluid collection at the dome of the bladder has decreased
in size, now measuring 13 x 12 mm, previously 17 x 15 mm.

Stomach/Bowel: Inflammatory changes surrounding the proximal sigmoid
colon appear mildly improved. Extensive sigmoid diverticulosis again
noted. Mildly distended small bowel loops in the upper abdomen are
again noted, improved when compared to prior study, with transition
point again seen in the lower mid abdomen near the site of
mesenteric inflammatory changes. Contrast reaches the colon.

The stomach is within normal limits.  Normal appendix.

Vascular/Lymphatic: Aortic atherosclerosis. No enlarged abdominal or
pelvic lymph nodes. Reactive mesenteric lymph nodes again noted.

Reproductive: Status post hysterectomy. No adnexal masses.

Other: The previously seen small abscesses in the lower abdomen have
decreased in size. For example, the largest collection now measures
3.0 x 1.1 cm, previously 3.6 x 1.5 cm. No new fluid collection.

Musculoskeletal: No acute or significant osseous findings.
IMPRESSION: 1. Improving sigmoid diverticulitis with interval decrease in size
of several abscesses in the lower abdomen and along the bladder
dome. No new fluid collection.
2. Improved partial small bowel obstruction with transition point in
the lower abdomen adjacent to the mesenteric inflammation.
3. Decreased bilateral pleural effusions.
4. Unchanged cholelithiasis.
5.  Aortic atherosclerosis ([1G]-[1G]).

## 2019-02-07 MED ORDER — IOPAMIDOL (ISOVUE-300) INJECTION 61%
100.0000 mL | Freq: Once | INTRAVENOUS | Status: AC | PRN
  Administered 2019-02-07: 14:00:00 100 mL via INTRAVENOUS

## 2019-02-07 NOTE — Progress Notes (Signed)
    IW:[id number] pain  Subjective: Pt complains of some abdominal pain again in the PM, cramping mid lower abdomen.  She also had some nausea and vomited x 1 last PM.  Currently she feels fine.  No pain on exam.    Objective: Vital signs in last 24 hours: Temp:  [98.5 F (36.9 C)-98.9 F (37.2 C)] 98.7 F (37.1 C) (04/24 0809) Pulse Rate:  [80-96] 90 (04/24 0809) Resp:  [16-18] 18 (04/24 0430) BP: (116-161)/(37-72) 118/66 (04/24 0809) SpO2:  [97 %-99 %] 97 % (04/24 0809) Last BM Date: 02/04/19  Intake/Output from previous day: 04/23 0701 - 04/24 0700 In: 600 [P.O.:600] Out: -  Intake/Output this shift: No intake/output data recorded.  General appearance: alert, cooperative and no distress Resp: clear to auscultation bilaterally GI: soft, non-tender; bowel sounds normal; no masses,  no organomegaly  Lab Results:  Recent Labs    02/06/19 0952 02/07/19 0347  WBC 12.6* 13.5*  HGB 10.7* 9.7*  HCT 33.1* 30.6*  PLT 502* 546*    BMET Recent Labs    02/06/19 0952 02/07/19 0347  NA 140 138  K 3.4* 4.1  CL 107 104  CO2 23 24  GLUCOSE 114* 100*  BUN 6* 5*  CREATININE 0.85 0.74  CALCIUM 9.1 8.9   PT/INR No results for input(s): LABPROT, INR in the last 72 hours.  Recent Labs  Lab 02/04/19 0433  AST 38  ALT 25  ALKPHOS 104  BILITOT 0.6  PROT 5.4*  ALBUMIN 2.1*     Lipase     Component Value Date/Time   LIPASE 26 01/25/2019 1623     Medications: . amoxicillin-clavulanate  1 tablet Oral BID WC  . enoxaparin (LOVENOX) injection  40 mg Subcutaneous Q24H  . feeding supplement  1 Container Oral TID BM  . guaiFENesin  600 mg Oral BID  . hydroxychloroquine  200 mg Oral BID  . montelukast  10 mg Oral QHS  . ondansetron (ZOFRAN) IV  4 mg Intravenous Once  . pantoprazole  40 mg Oral Daily  . potassium chloride  20 mEq Oral Once  . 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 Appreciate their input - Mobilize, IS - WBC 13.6(4/13)>>9.4(4/15)>>16.6(4/16)>>13.3(4/18)14.6(4/14)>>15.3(4/20) 12.7(4/21)>>12.5(4/22)>>12.6(4/23)>>13.7(4/24)   GNO:IBBC diet/IV fluids VTE: SCD's,Lovenox, moblize WU:GQBVQ 04/12- 4/23;  Augmentin 4/23>> day 2 (second dose this AM) Follow up:Dr. Dema Severin   Plan:  She looks fine during the day, but has discomfort at night.  WBC is not improving, will get a CT this AM and make further recommendations after the CT is reviewed.      LOS: 13 days    Denise Payne 02/07/2019 [phone]

## 2019-02-07 NOTE — Progress Notes (Signed)
PROGRESS NOTE  Denise Payne TIR:[id number] DOB: Dec 07, 1945 DOA: 01/25/2019 PCP: Leeroy Cha, MD  Brief History: 73 y.o.femaleretired RN with a history of persistent asthma and Sjogren's syndrome on plaquenil who presented to the ED with abdominal pain and intermittent pneumaturia for months. 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 CT also noted intraperitoneal free air suggesting microperforation.General surgery and urology consulted and patient admitted with zosyn being started.  Pt has slowly improved and diet slowly advanced.  02/07/19 repeat CT abd shows improving diverticulitis and intra-abd abscesses.  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 -continue zosyn per general surgery>>>amox/clav on 4/23 -per surgical team okay to advance diet to soft--mild cramping postprandial -No further emesis frequency and consistency of stool appears to be improving,C. difficile was negative -4/24-repeat CT abd--improving sigmoid diverticulitis with decreasing size of abscesses; no new abscesses; improved PSBO  FEN/Hypokalemia--intake is improving -replete K -check mag--1.7  Asthma: No acute exacerbation--- Continue singulair and prn neb  Sjogren syndrome: Stable, - Continue plaquenil.  Normocytic anemia, -suspected to be due to chronic disease and Plaquenil use, anemia work-up reveals some degree of iron deficiency, ferritin is normal, serum iron is low at 14, iron saturation is low at 7, with a low TIBC of 676, P95 and folic acid are not low.  -No evidence of ongoing blood loss or  bleeding, hemoglobin is stable above 9 -start iron when stable  HTN: -No BP medications PTA.  - Hydralazine IV prn -BP remains acceptable       Disposition Plan: Home when cleared by general surgery Family Communication: No Family at bedside  Consultants:General surgery  Code Status: FULL  DVT Prophylaxis: [place] Lovenox   Procedures: As Listed in Progress Note Above  Antibiotics: Zosyn 4/11>>>4/23 Amox/clav 4/23>>>      Subjective: Pt still has some post-prandial abd cramping without n/v/d.  abd pain overall stable/improving.  She denies f/c, cp, sob, cough, dysuria, hematochezia, melena  Objective: Vitals:   02/06/19 2343 02/07/19 0430 02/07/19 0809 02/07/19 1154  BP: (!) 150/68 (!) 141/52 118/66 (!) 118/59  Pulse: 96 83 90 75  Resp: 18 18  15   Temp: 98.7 F (37.1 C) 98.9 F (37.2 C) 98.7 F (37.1 C) 98.6 F (37 C)  TempSrc: Oral Oral Oral Oral  SpO2: 97% 97% 97% 98%  Weight:      Height:        Intake/Output Summary (Last 24 hours) at 02/07/2019 1630 Last data filed at 02/06/2019 2100 Gross per 24 hour  Intake 120 ml  Output --  Net 120 ml   Weight change:  Exam:   General:  Pt is alert, follows commands appropriately, not in acute distress  HEENT: No icterus, No thrush, No neck mass, /AT  Cardiovascular: RRR, S1/S2, no rubs, no gallops  Respiratory: CTA bilaterally, no wheezing, no crackles, no rhonchi  Abdomen: Soft/+BS, mild RLQ tender, non distended, no guarding  Extremities: No edema, No lymphangitis, No petechiae, No rashes, no synovitis   Data Reviewed: I have personally reviewed following labs and imaging studies Basic Metabolic Panel: Recent Labs  Lab 02/01/19 0429 02/02/19 1106 02/04/19 0433 02/06/19 0403 02/06/19 0952 02/07/19 0347  NA 140 136  136  --  140 138  K 3.3* 3.8 3.5  --  3.4* 4.1  CL 103 104 103  --  107 104  CO2 24 22 23   --  23 24  GLUCOSE 103* 101* 99  --  114* 100*  BUN 10 8  6*  --  6* 5*  CREATININE 0.61 0.65 0.63  --  0.85 0.74  CALCIUM 8.3* 8.7* 8.3*  --  9.1 8.9  MG 1.9  --   --  1.7  --  2.3   Liver Function Tests: Recent Labs  Lab 02/04/19 0433  AST 38  ALT 25  ALKPHOS 104  BILITOT 0.6  PROT 5.4*  ALBUMIN 2.1*   No results for input(s): LIPASE, AMYLASE in the last 168 hours. No results for input(s): AMMONIA in the last 168 hours. Coagulation Profile: Recent Labs  Lab 02/04/19 0433  INR 1.2   CBC: Recent Labs  Lab 02/03/19 0544 02/04/19 0433 02/05/19 0519 02/06/19 0952 02/07/19 0347  WBC 15.3* 12.7* 12.5* 12.6* 13.5*  HGB 9.7* 9.3* 9.5* 10.7* 9.7*  HCT 29.9* 29.2* 29.7* 33.1* 30.6*  MCV 85.9 84.9 85.3 86.9 86.2  PLT 353 384 420* 502* 546*   Cardiac Enzymes: No results for input(s): CKTOTAL, CKMB, CKMBINDEX, TROPONINI in the last 168 hours. BNP: Invalid input(s): POCBNP CBG: No results for input(s): GLUCAP in the last 168 hours. HbA1C: No results for input(s): HGBA1C in the last 72 hours. Urine analysis:    Component Value Date/Time   COLORURINE AMBER (A) 01/25/2019 1823   APPEARANCEUR CLOUDY (A) 01/25/2019 1823   LABSPEC 1.025 01/25/2019 1823   PHURINE 6.0 01/25/2019 1823   GLUCOSEU NEGATIVE 01/25/2019 1823   HGBUR SMALL (A) 01/25/2019 1823   BILIRUBINUR NEGATIVE 01/25/2019 1823   KETONESUR 40 (A) 01/25/2019 1823   PROTEINUR 30 (A) 01/25/2019 1823   NITRITE POSITIVE (A) 01/25/2019 1823   LEUKOCYTESUR TRACE (A) 01/25/2019 1823   Sepsis Labs: @LABRCNTIP (procalcitonin:4,lacticidven:4) ) Recent Results (from the past 240 hour(s))  C difficile quick scan w PCR reflex     Status: None   Collection Time: 02/02/19  2:23 PM  Result Value Ref Range Status   C Diff antigen NEGATIVE NEGATIVE Final   C Diff toxin NEGATIVE NEGATIVE Final   C Diff interpretation No C. difficile detected.  Final    Comment: Performed at [facility], [place] [address], [place], [place] [address]     Scheduled Meds:   amoxicillin-clavulanate  1 tablet Oral BID WC   enoxaparin (LOVENOX) injection  40 mg Subcutaneous Q24H   feeding supplement  1 Container Oral TID BM   guaiFENesin  600 mg Oral BID   hydroxychloroquine  200 mg Oral BID   montelukast  10 mg Oral QHS   ondansetron (ZOFRAN) IV  4 mg Intravenous Once   pantoprazole  40 mg Oral Daily   potassium chloride  20 mEq Oral Once   saccharomyces boulardii  250 mg Oral BID   Continuous Infusions:  sodium chloride Stopped (02/04/19 2308)   dextrose 5 % and 0.45 % NaCl with KCl 40 mEq/L 30 mL/hr at 02/06/19 0459    Procedures/Studies: Ct Abdomen Pelvis W Contrast  Result Date: 02/07/2019 CLINICAL DATA:  Diverticular abscess follow-up. EXAM: CT ABDOMEN AND PELVIS WITH CONTRAST TECHNIQUE: Multidetector CT imaging of the abdomen and pelvis was performed using the standard protocol following bolus administration of intravenous contrast. CONTRAST:  177mL ISOVUE-300 IOPAMIDOL (ISOVUE-300) INJECTION 61% COMPARISON:  CT abdomen pelvis dated January 31, 2019. FINDINGS: Lower chest: Improving now small right and trace left pleural effusions and bilateral lower lobe subsegmental atelectasis. Hepatobiliary: No focal liver abnormality. Unchanged cholelithiasis. No gallbladder wall thickening or biliary dilatation. Pancreas: Unremarkable. No pancreatic ductal dilatation or surrounding inflammatory changes. Spleen: Normal in size without focal abnormality. Adrenals/Urinary Tract: Adrenal glands are unremarkable. Kidneys are normal, without renal calculi, focal lesion, or hydronephrosis. Rim enhancing fluid collection at the dome of the bladder has decreased in size, now measuring 13 x 12 mm, previously 17 x 15 mm. Stomach/Bowel: Inflammatory changes surrounding the proximal sigmoid colon appear mildly improved. Extensive sigmoid diverticulosis again noted. Mildly distended small bowel loops in the upper abdomen are again noted, improved when compared to prior study,  with transition point again seen in the lower mid abdomen near the site of mesenteric inflammatory changes. Contrast reaches the colon. The stomach is within normal limits.  Normal appendix. Vascular/Lymphatic: Aortic atherosclerosis. No enlarged abdominal or pelvic lymph nodes. Reactive mesenteric lymph nodes again noted. Reproductive: Status post hysterectomy. No adnexal masses. Other: The previously seen small abscesses in the lower abdomen have decreased in size. For example, the largest collection now measures 3.0 x 1.1 cm, previously 3.6 x 1.5 cm. No new fluid collection. Musculoskeletal: No acute or significant osseous findings. IMPRESSION: 1. Improving sigmoid diverticulitis with interval decrease in size of several abscesses in the lower abdomen and along the bladder dome. No new fluid collection. 2. Improved partial small bowel obstruction with transition point in the lower abdomen adjacent to the mesenteric inflammation. 3. Decreased bilateral pleural effusions. 4. Unchanged cholelithiasis. 5.  Aortic atherosclerosis (ICD10-I70.0). Electronically Signed   By: Titus Dubin M.D.   On: 02/07/2019 14:52   Ct Abdomen Pelvis W Contrast  Result Date: 01/31/2019 CLINICAL DATA:  Follow-up diverticulitis EXAM: CT ABDOMEN AND PELVIS WITH CONTRAST TECHNIQUE: Multidetector CT imaging of the abdomen and pelvis was performed using the standard protocol following bolus administration of intravenous contrast. CONTRAST:  19mL OMNIPAQUE IOHEXOL 300 MG/ML  SOLN COMPARISON:  01/25/2019 FINDINGS: Lower chest: Moderate right right and small left pleural effusion with dependent atelectasis. Interlobular septal thickening at the lung bases likely represents a small amount of edema. Hepatobiliary: Cholelithiasis.  Liver is unremarkable. Pancreas: Unremarkable Spleen: Calcified granuloma.  Otherwise unremarkable. Adrenals/Urinary Tract: Kidneys are within normal limits. Adrenal glands are unremarkable. Bladder contains a  small amount of gas. There is a 1.7 x 1.5 cm fluid and gas collection at the dome of the bladder of presenting either a large diverticulum or possibly a small abscess. These findings raise the possibility of fistula between sigmoid colon and bladder. Stomach/Bowel: There are inflammatory changes of the sigmoid colon adjacent to the dome of the bladder. This is characterized by wall thickening and stranding in the adjacent fat. Diverticulosis of the sigmoid and descending colon are noted. There is a gas and fluid collection inseparable from the sigmoid colon and dome of the bladder representing either a large inflamed diverticulum or possibly a small abscess. This may indicate a fistula. Distended small bowel loops with air-fluid levels are present in the upper abdomen. Distal small bowel loops are decompressed. The transition point is in the inflammatory process within the pelvis. Vascular/Lymphatic: Atherosclerotic vascular calcifications. No abnormal retroperitoneal adenopathy. Several prominent but non pathologically enlarged mesenteric nodes are noted and are likely inflammatory in nature. Reproductive: Uterus is absent. Other: There is stranding and inflammatory changes within the anterior pelvic fat adjacent to the area of diverticulitis. Multiple very small gas and  fluid filled abscesses are present in the lower abdomen and upper pelvis. The largest measures 3.6 x 1.5 cm. A loop of bowel is seen anterior to this fluid collection. Small amount of free fluid about the liver. Musculoskeletal: No vertebral compression deformity. IMPRESSION: Findings are again consistent with acute sigmoid diverticulitis. There are findings worrisome for a fistula between the sigmoid colon and bladder as described above. There are gas and fluid filled collections in the lower abdomen and upper pelvis compatible with multiple small abscesses. The largest is 3.6 x 1.5 cm. It is posterior to a loop of bowel and safe access for  percutaneous drainage is not possible. Partial small bowel obstruction pattern. The transition point is in the pelvis at the site of the inflammatory process. Cholelithiasis. Bilateral pleural effusions. Electronically Signed   By: Marybelle Killings M.D.   On: 01/31/2019 11:51   Ct Abdomen Pelvis W Contrast  Result Date: 01/25/2019 CLINICAL DATA:  Diffuse abdominal pain. History of GERD, nausea. EXAM: CT ABDOMEN AND PELVIS WITH CONTRAST TECHNIQUE: Multidetector CT imaging of the abdomen and pelvis was performed using the standard protocol following bolus administration of intravenous contrast. CONTRAST:  1110mL OMNIPAQUE IOHEXOL 300 MG/ML  SOLN COMPARISON:  None. FINDINGS: Lower chest: Mild bibasilar scarring/atelectasis. Hepatobiliary: Numerous stones within the a otherwise unremarkable gallbladder. No focal liver abnormality. No significant bile duct dilatation seen. Pancreas: Unremarkable. No pancreatic ductal dilatation or surrounding inflammatory changes. Spleen: Normal in size without focal abnormality. Adrenals/Urinary Tract: Adrenal glands appear normal. Kidneys are unremarkable without mass, stone or hydronephrosis. No perinephric fluid. Air within the bladder. Stomach/Bowel: Extensive diverticulosis throughout the colon. Inflammation/fluid stranding is seen about the sigmoid colon indicating acute diverticulitis. The inflamed segment of bowel is contiguous with the anterior-superior margin of the bladder concerning for fistula. No dilated large or small bowel loops. Stomach is unremarkable, decompressed. Appendix is normal. Vascular/Lymphatic: Aortic atherosclerosis. No acute appearing vascular abnormality. Numerous small lymph nodes within the lower mesentery, likely reactive in nature. Reproductive: Presumed hysterectomy. No adnexal mass. Other: Small amount of free intraperitoneal air within the central abdomen and upper abdomen. No abscess collection seen. Musculoskeletal: No acute or suspicious osseous  finding. IMPRESSION: 1. Diverticulitis of the sigmoid colon. The inflamed segment of sigmoid colon is contiguous with the anterior-superior margin of the bladder indicating colovesical fistula, with associated air in the bladder. No circumscribed abscess collection identified. 2. Free intraperitoneal air within the abdomen and pelvis indicating associated bowel perforation, likely micro perforation given the small amount of free air. 3. Cholelithiasis without evidence of acute cholecystitis. These results were called by telephone at the time of interpretation on 01/25/2019 at 6:25 pm to Dr. Jola Schmidt , who verbally acknowledged these results. Aortic Atherosclerosis (ICD10-I70.0). Electronically Signed   By: Franki Cabot M.D.   On: 01/25/2019 18:27    Orson Eva, DO  [facility] Pager [phone]  If 7PM-7AM, please contact night-coverage [email or web address] Password [email or web address] 02/07/2019, 4:30 PM   LOS: 13 days

## 2019-02-08 LAB — CBC
HCT: 30.3 % — ABNORMAL LOW (ref 36.0–46.0)
Hemoglobin: 9.6 g/dL — ABNORMAL LOW (ref 12.0–15.0)
MCH: 27.4 pg (ref 26.0–34.0)
MCHC: 31.7 g/dL (ref 30.0–36.0)
MCV: 86.3 fL (ref 80.0–100.0)
Platelets: 545 10*3/uL — ABNORMAL HIGH (ref 150–400)
RBC: 3.51 MIL/uL — ABNORMAL LOW (ref 3.87–5.11)
RDW: 13.5 % (ref 11.5–15.5)
WBC: 12.1 10*3/uL — ABNORMAL HIGH (ref 4.0–10.5)
nRBC: 0 % (ref 0.0–0.2)

## 2019-02-08 LAB — BASIC METABOLIC PANEL
Anion gap: 10 (ref 5–15)
BUN: 6 mg/dL — ABNORMAL LOW (ref 8–23)
CO2: 22 mmol/L (ref 22–32)
Calcium: 8.6 mg/dL — ABNORMAL LOW (ref 8.9–10.3)
Chloride: 105 mmol/L (ref 98–111)
Creatinine, Ser: 0.76 mg/dL (ref 0.44–1.00)
GFR calc Af Amer: >60 mL/min (ref >60)
GFR calc non Af Amer: >60 mL/min (ref >60)
Glucose, Bld: 93 mg/dL (ref 70–99)
Potassium: 3.8 mmol/L (ref 3.5–5.1)
Sodium: 137 mmol/L (ref 135–145)

## 2019-02-08 MED ORDER — OXYCODONE HCL 5 MG PO TABS: 5.0000 mg | ORAL_TABLET | Freq: Four times a day (QID) | ORAL | 0 refills | Status: DC | PRN

## 2019-02-08 MED ORDER — AMOXICILLIN-POT CLAVULANATE 875-125 MG PO TABS: 1.0000 | ORAL_TABLET | Freq: Two times a day (BID) | ORAL | 0 refills | Status: DC

## 2019-02-08 NOTE — Progress Notes (Signed)
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 Appreciate their input - Mobilize, IS - given resolution of symptoms and tolerating oral antibiotics, I think she can be safely discharged home with 10 days of augmentin antibiotics. She will follow up with our colorectal surgeons to continue discussion of eventual resection for fistula complication. - I also discussed risks of recurrent UTIs or risk of worsening diverticulitis and to contact our office or PCP if symptoms return   ACZ:YSAY diet/IV fluids VTE: SCD's,Lovenox, moblize TK:ZSWFU 04/12- 4/23;  Augmentin 4/23>> day 2 (second dose this AM) Follow up:Dr. Dema Severin   Plan:  She looks fine during the day, but has discomfort at night.  WBC is not improving, will get a CT this AM and make further recommendations after the CT is reviewed.      LOS: 14 days  Chief Complaint/Subjective: No abdominal pain, tolerating soft diet, having bowel function  Objective: Vital signs in last 24 hours: Temp:  [98.6 F (37 C)-99.5 F (37.5 C)] 98.6 F (37 C) (04/25 0749) Pulse Rate:  [75-103] 80 (04/25 0749) Resp:  [15-18] 16 (04/25 0749) BP: (112-149)/(44-74) 120/44 (04/25 0749) SpO2:  [96 %-99 %] 96 % (04/25 0749) Last BM Date: 02/02/19  Intake/Output from previous day: No intake/output data recorded. Intake/Output this shift: No intake/output data recorded.  Lungs: nonlabored  Cardiovascular: RRR  Abd: soft, Nt, ND  Extremities: no edema  Neuro: AOx4  Lab  Results: CBC  Recent Labs    02/07/19 0347 02/08/19 0354  WBC 13.5* 12.1*  HGB 9.7* 9.6*  HCT 30.6* 30.3*  PLT 546* 545*   BMET Recent Labs    02/07/19 0347 02/08/19 0354  NA 138 137  K 4.1 3.8  CL 104 105  CO2 24 22  GLUCOSE 100* 93  BUN 5* 6*  CREATININE 0.74 0.76  CALCIUM 8.9 8.6*   PT/INR No results for input(s): LABPROT, INR in the last 72 hours. ABG No results for input(s): PHART, HCO3 in the last 72 hours.  Invalid input(s): PCO2, PO2  Studies/Results:  Anti-infectives: Anti-infectives (From admission, onward)   Start     Dose/Rate Route Frequency Ordered Stop   02/06/19 1700  amoxicillin-clavulanate (AUGMENTIN) 875-125 MG per tablet 1 tablet     1 tablet Oral 2 times daily with meals 02/06/19 1317     01/26/19 1000  hydroxychloroquine (PLAQUENIL) tablet 200 mg     200 mg Oral 2 times daily 01/26/19 0041     01/26/19 0000  piperacillin-tazobactam (ZOSYN) IVPB 3.375 g  Status:  Discontinued     3.375 g 100 mL/hr over 30 Minutes Intravenous Every 6 hours 01/25/19 2138 01/25/19 2325   01/26/19 0000  piperacillin-tazobactam (ZOSYN) IVPB 3.375 g  Status:  Discontinued     3.375 g 12.5 mL/hr over 240 Minutes Intravenous Every 8 hours 01/25/19 2325 02/06/19 1317   01/25/19 1845  piperacillin-tazobactam (ZOSYN) IVPB 3.375 g     3.375 g 100 mL/hr over 30 Minutes Intravenous  Once 01/25/19 1832 01/25/19 1927      Medications: Scheduled Meds: . amoxicillin-clavulanate  1 tablet Oral BID WC  .  enoxaparin (LOVENOX) injection  40 mg Subcutaneous Q24H  . feeding supplement  1 Container Oral TID BM  . guaiFENesin  600 mg Oral BID  . hydroxychloroquine  200 mg Oral BID  . montelukast  10 mg Oral QHS  . ondansetron (ZOFRAN) IV  4 mg Intravenous Once  . pantoprazole  40 mg Oral Daily  . potassium chloride  20 mEq Oral Once  . saccharomyces boulardii  250 mg Oral BID   Continuous Infusions: . sodium chloride Stopped (02/04/19 2308)  . dextrose 5 % and 0.45 %  NaCl with KCl 40 mEq/L 30 mL/hr at 02/07/19 1724   PRN Meds:.sodium chloride, acetaminophen **OR** acetaminophen, albuterol, hydrALAZINE, morphine injection, ondansetron (ZOFRAN) IV, oxyCODONE  Mickeal Skinner, MD [facility]

## 2019-02-08 NOTE — Progress Notes (Signed)
Patient discharged home with family. IV removed with the catheter intact. Discharge instructions given with the patient verbalizing understanding.

## 2019-02-08 NOTE — Discharge Summary (Signed)
Physician Discharge Summary  Denise Payne MBW:[id number] DOB: Feb 02, 1946 DOA: 01/25/2019  PCP: Leeroy Cha, MD  Admit date: 01/25/2019 Discharge date: 02/08/2019  Admitted From: Home  Disposition:  Home   Recommendations for Outpatient Follow-up:  1. Follow up with General Surgery, Dr. Dema Severin, in 2 weeks 2. Please follow up with Urology in 3-4 weeks 3. Dr. Dema Severin, please obtain CBC in 2 weeks 4. Dr. Fara Olden: Please start oral iron   Home Health: None  Equipment/Devices: None  Discharge Condition: Good  CODE STATUS: FULL Diet recommendation: Soft  Brief/Interim Summary: Denise Payne is a 73 y.o.Fwith asthma and Sjogren's syndrome on plaquenil who presented to the ED with abdominal pain and intermittent pneumaturia for months. 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 CT also noted intraperitoneal free air suggesting microperforation.  General surgery and urology consulted and patient admitted with zosyn being started.        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  WBC trended slightly down.    General Surgery and Urology consulted.  Latter recommended outpatient follow up only.    Patient with persistent pain. CT abdomen repeated day before admission showed improvement in inflammation, resolving diverticulitis.  WBC down day of discharge, afebrile, mentation good, pain completely resolved for 24 hours, and taking PO well.  Discharged with Augmentin 10 days and close Surgery follow up.   Asthma No acute disease  Sjogren's syndrome Continued on home  Plaquenil.  Normocytic anemia Suspected to be chronic disease with some superimposed iron deficiency (iron saturation <10).  B12 and folate normal.  Hypertension        Discharge Instructions  Discharge Instructions    Diet general   Complete by:  As directed    Discharge instructions   Complete by:  As directed    From Dr. Loleta Books: You were admitted for diverticulitis.   You were treated with antibiotics and your symptoms, CT imaging and WBC appears to be improving, which is reassuring.  Take Augmentin 875-125 mg twice daily for the next 10 days Take naproxen and/or oxycodone at night for pain as needed Use oxycodone with care, do not drive while taking it, and do not take with alcohol or other sedating or sleep inducing medicines ever. Adhere to a soft diet for the next few days.  Follow up with the General Surgery office at the time listed below, or call at the number listed below.   Increase activity slowly   Complete by:  As directed      Allergies as of 02/08/2019      Reactions   Latex       Medication List    STOP taking these medications   HYDROcodone-acetaminophen 5-325 MG tablet Commonly known as:  NORCO/VICODIN     TAKE these medications   albuterol 108 (90 Base) MCG/ACT inhaler Commonly known as:  VENTOLIN HFA Inhale 2 puffs into the lungs every 6 (six) hours as needed for wheezing or shortness of breath.   amoxicillin-clavulanate 875-125 MG tablet Commonly known as:  AUGMENTIN Take 1 tablet by mouth 2 (two) times daily with a meal.   aspirin EC 81 MG tablet Take 81 mg by mouth daily.   Calcium 600 600 MG  Tabs tablet Generic drug:  calcium carbonate Take 600 mg by mouth 2 (two) times daily with a meal.   cyclobenzaprine 10 MG tablet Commonly known as:  FLEXERIL Take by mouth.   diclofenac sodium 1 % Gel Commonly known as:  VOLTAREN Apply 2 g topically 2 (two) times daily as needed (pain).   esomeprazole 40 MG capsule Commonly known  as:  NEXIUM Take 40 mg by mouth daily.   hydroxychloroquine 200 MG tablet Commonly known as:  PLAQUENIL Take 200 mg by mouth 2 (two) times daily.   Melatonin 10 MG Tabs Take 10 mg by mouth at bedtime as needed (sleep).   montelukast 10 MG tablet Commonly known as:  SINGULAIR Take 10 mg by mouth at bedtime.   naproxen sodium 220 MG tablet Commonly known as:  ALEVE Take 220 mg by mouth as needed (pain).   oxyCODONE 5 MG immediate release tablet Commonly known as:  Oxy IR/ROXICODONE Take 1 tablet (5 mg total) by mouth every 6 (six) hours as needed (5mg  for moderate pain, 10mg  for severe pain).   vitamin B-12 1000 MCG tablet Commonly known as:  CYANOCOBALAMIN Take 1,000 mcg by mouth daily.   Vitamin D 50 MCG (2000 UT) tablet Take 2,000 Units by mouth daily.      Follow-up Information    Leeroy Cha, MD.   Specialty:  Internal Medicine Contact information: [address] [place] [address] [phone]        Ileana Roup, MD. Schedule an appointment as soon as possible for a visit in 2 week(s).   Specialty:  General Surgery Contact information: [place] [address] [phone]        Alexis Frock, MD. Schedule an appointment as soon as possible for a visit.   Specialty:  Urology Contact information: [address] [place] [address] [phone]          Allergies  Allergen Reactions  . Latex     Consultations:  General Surgery  Urology    Procedures/Studies: Ct Abdomen Pelvis W Contrast  Result Date: 02/07/2019 CLINICAL DATA:  Diverticular abscess follow-up. EXAM: CT ABDOMEN AND PELVIS WITH CONTRAST TECHNIQUE: Multidetector CT imaging of the abdomen and pelvis was performed using the standard protocol following bolus administration of intravenous contrast. CONTRAST:  163mL ISOVUE-300 IOPAMIDOL (ISOVUE-300) INJECTION 61% COMPARISON:  CT abdomen pelvis dated January 31, 2019. FINDINGS:  Lower chest: Improving now small right and trace left pleural effusions and bilateral lower lobe subsegmental atelectasis. Hepatobiliary: No focal liver abnormality. Unchanged cholelithiasis. No gallbladder wall thickening or biliary dilatation. Pancreas: Unremarkable. No pancreatic ductal dilatation or surrounding inflammatory changes. Spleen: Normal in size without focal abnormality. Adrenals/Urinary Tract: Adrenal glands are unremarkable. Kidneys are normal, without renal calculi, focal lesion, or hydronephrosis. Rim enhancing fluid collection at the dome of the bladder has decreased in size, now measuring 13 x 12 mm, previously 17 x 15 mm. Stomach/Bowel: Inflammatory changes surrounding the proximal sigmoid colon appear mildly improved. Extensive sigmoid diverticulosis again noted. Mildly distended small bowel loops in the upper abdomen are again noted, improved when compared to prior study, with transition point again seen in the lower mid abdomen near the site of mesenteric inflammatory changes. Contrast reaches the colon. The stomach is within normal limits.  Normal appendix. Vascular/Lymphatic: Aortic atherosclerosis. No enlarged abdominal or pelvic lymph nodes. Reactive mesenteric lymph nodes again noted. Reproductive: Status post hysterectomy. No adnexal masses. Other: The previously seen small abscesses in the lower  abdomen have decreased in size. For example, the largest collection now measures 3.0 x 1.1 cm, previously 3.6 x 1.5 cm. No new fluid collection. Musculoskeletal: No acute or significant osseous findings. IMPRESSION: 1. Improving sigmoid diverticulitis with interval decrease in size of several abscesses in the lower abdomen and along the bladder dome. No new fluid collection. 2. Improved partial small bowel obstruction with transition point in the lower abdomen adjacent to the mesenteric inflammation. 3. Decreased bilateral pleural effusions. 4. Unchanged cholelithiasis. 5.  Aortic  atherosclerosis (ICD10-I70.0). Electronically Signed   By: Titus Dubin M.D.   On: 02/07/2019 14:52   Ct Abdomen Pelvis W Contrast  Result Date: 01/31/2019 CLINICAL DATA:  Follow-up diverticulitis EXAM: CT ABDOMEN AND PELVIS WITH CONTRAST TECHNIQUE: Multidetector CT imaging of the abdomen and pelvis was performed using the standard protocol following bolus administration of intravenous contrast. CONTRAST:  137mL OMNIPAQUE IOHEXOL 300 MG/ML  SOLN COMPARISON:  01/25/2019 FINDINGS: Lower chest: Moderate right right and small left pleural effusion with dependent atelectasis. Interlobular septal thickening at the lung bases likely represents a small amount of edema. Hepatobiliary: Cholelithiasis.  Liver is unremarkable. Pancreas: Unremarkable Spleen: Calcified granuloma.  Otherwise unremarkable. Adrenals/Urinary Tract: Kidneys are within normal limits. Adrenal glands are unremarkable. Bladder contains a small amount of gas. There is a 1.7 x 1.5 cm fluid and gas collection at the dome of the bladder of presenting either a large diverticulum or possibly a small abscess. These findings raise the possibility of fistula between sigmoid colon and bladder. Stomach/Bowel: There are inflammatory changes of the sigmoid colon adjacent to the dome of the bladder. This is characterized by wall thickening and stranding in the adjacent fat. Diverticulosis of the sigmoid and descending colon are noted. There is a gas and fluid collection inseparable from the sigmoid colon and dome of the bladder representing either a large inflamed diverticulum or possibly a small abscess. This may indicate a fistula. Distended small bowel loops with air-fluid levels are present in the upper abdomen. Distal small bowel loops are decompressed. The transition point is in the inflammatory process within the pelvis. Vascular/Lymphatic: Atherosclerotic vascular calcifications. No abnormal retroperitoneal adenopathy. Several prominent but non  pathologically enlarged mesenteric nodes are noted and are likely inflammatory in nature. Reproductive: Uterus is absent. Other: There is stranding and inflammatory changes within the anterior pelvic fat adjacent to the area of diverticulitis. Multiple very small gas and fluid filled abscesses are present in the lower abdomen and upper pelvis. The largest measures 3.6 x 1.5 cm. A loop of bowel is seen anterior to this fluid collection. Small amount of free fluid about the liver. Musculoskeletal: No vertebral compression deformity. IMPRESSION: Findings are again consistent with acute sigmoid diverticulitis. There are findings worrisome for a fistula between the sigmoid colon and bladder as described above. There are gas and fluid filled collections in the lower abdomen and upper pelvis compatible with multiple small abscesses. The largest is 3.6 x 1.5 cm. It is posterior to a loop of bowel and safe access for percutaneous drainage is not possible. Partial small bowel obstruction pattern. The transition point is in the pelvis at the site of the inflammatory process. Cholelithiasis. Bilateral pleural effusions. Electronically Signed   By: Marybelle Killings M.D.   On: 01/31/2019 11:51   Ct Abdomen Pelvis W Contrast  Result Date: 01/25/2019 CLINICAL DATA:  Diffuse abdominal pain. History of GERD, nausea. EXAM: CT ABDOMEN AND PELVIS WITH CONTRAST TECHNIQUE: Multidetector CT imaging of the abdomen and pelvis was performed using  the standard protocol following bolus administration of intravenous contrast. CONTRAST:  168mL OMNIPAQUE IOHEXOL 300 MG/ML  SOLN COMPARISON:  None. FINDINGS: Lower chest: Mild bibasilar scarring/atelectasis. Hepatobiliary: Numerous stones within the a otherwise unremarkable gallbladder. No focal liver abnormality. No significant bile duct dilatation seen. Pancreas: Unremarkable. No pancreatic ductal dilatation or surrounding inflammatory changes. Spleen: Normal in size without focal abnormality.  Adrenals/Urinary Tract: Adrenal glands appear normal. Kidneys are unremarkable without mass, stone or hydronephrosis. No perinephric fluid. Air within the bladder. Stomach/Bowel: Extensive diverticulosis throughout the colon. Inflammation/fluid stranding is seen about the sigmoid colon indicating acute diverticulitis. The inflamed segment of bowel is contiguous with the anterior-superior margin of the bladder concerning for fistula. No dilated large or small bowel loops. Stomach is unremarkable, decompressed. Appendix is normal. Vascular/Lymphatic: Aortic atherosclerosis. No acute appearing vascular abnormality. Numerous small lymph nodes within the lower mesentery, likely reactive in nature. Reproductive: Presumed hysterectomy. No adnexal mass. Other: Small amount of free intraperitoneal air within the central abdomen and upper abdomen. No abscess collection seen. Musculoskeletal: No acute or suspicious osseous finding. IMPRESSION: 1. Diverticulitis of the sigmoid colon. The inflamed segment of sigmoid colon is contiguous with the anterior-superior margin of the bladder indicating colovesical fistula, with associated air in the bladder. No circumscribed abscess collection identified. 2. Free intraperitoneal air within the abdomen and pelvis indicating associated bowel perforation, likely micro perforation given the small amount of free air. 3. Cholelithiasis without evidence of acute cholecystitis. These results were called by telephone at the time of interpretation on 01/25/2019 at 6:25 pm to Dr. Jola Schmidt , who verbally acknowledged these results. Aortic Atherosclerosis (ICD10-I70.0). Electronically Signed   By: Franki Cabot M.D.   On: 01/25/2019 18:27       Subjective: No complaints.  No abdominal pain. Appetite good.  No fever.    Discharge Exam: Vitals:   02/08/19 0422 02/08/19 0749  BP: (!) 112/56 (!) 120/44  Pulse: 84 80  Resp: 16 16  Temp: 99.1 F (37.3 C) 98.6 F (37 C)  SpO2: 96% 96%    Vitals:   02/07/19 2019 02/07/19 2310 02/08/19 0422 02/08/19 0749  BP: (!) 147/60 (!) 149/74 (!) 112/56 (!) 120/44  Pulse: (!) 103 100 84 80  Resp: 16 18 16 16   Temp: 98.6 F (37 C) 98.8 F (37.1 C) 99.1 F (37.3 C) 98.6 F (37 C)  TempSrc: Oral Oral Oral Oral  SpO2: 99% 98% 96% 96%  Weight:      Height:        General: Pt is alert, awake, not in acute distress Cardiovascular: RRR, nl S1-S2, no murmurs appreciated.   No LE edema.   Respiratory: Normal respiratory rate and rhythm.  CTAB without rales or wheezes. Abdominal: Abdomen soft and non-tender.  No distension or HSM.   Neuro/Psych: Strength symmetric in upper and lower extremities.  Judgment and insight appear normal.   The results of significant diagnostics from this hospitalization (including imaging, microbiology, ancillary and laboratory) are listed below for reference.     Microbiology: Recent Results (from the past 240 hour(s))  C difficile quick scan w PCR reflex     Status: None   Collection Time: 02/02/19  2:23 PM  Result Value Ref Range Status   C Diff antigen NEGATIVE NEGATIVE Final   C Diff toxin NEGATIVE NEGATIVE Final   C Diff interpretation No C. difficile detected.  Final    Comment: Performed at [facility], [place] [address] [place], [place] [address]  Labs: BNP (last 3 results) No results for input(s): BNP in the last 8760 hours. Basic Metabolic Panel: Recent Labs  Lab 02/02/19 1106 02/04/19 0433 02/06/19 0403 02/06/19 0952 02/07/19 0347 02/08/19 0354  NA 136 136  --  140 138 137  K 3.8 3.5  --  3.4* 4.1 3.8  CL 104 103  --  107 104 105  CO2 22 23  --  23 24 22   GLUCOSE 101* 99  --  114* 100* 93  BUN 8 6*  --  6* 5* 6*  CREATININE 0.65 0.63  --  0.85 0.74 0.76  CALCIUM 8.7* 8.3*  --  9.1 8.9 8.6*  MG  --   --  1.7  --  2.3  --    Liver Function Tests: Recent Labs  Lab 02/04/19 0433  AST 38  ALT 25  ALKPHOS 104  BILITOT 0.6  PROT 5.4*  ALBUMIN 2.1*   No results  for input(s): LIPASE, AMYLASE in the last 168 hours. No results for input(s): AMMONIA in the last 168 hours. CBC: Recent Labs  Lab 02/04/19 0433 02/05/19 0519 02/06/19 0952 02/07/19 0347 02/08/19 0354  WBC 12.7* 12.5* 12.6* 13.5* 12.1*  HGB 9.3* 9.5* 10.7* 9.7* 9.6*  HCT 29.2* 29.7* 33.1* 30.6* 30.3*  MCV 84.9 85.3 86.9 86.2 86.3  PLT 384 420* 502* 546* 545*   Cardiac Enzymes: No results for input(s): CKTOTAL, CKMB, CKMBINDEX, TROPONINI in the last 168 hours. BNP: Invalid input(s): POCBNP CBG: No results for input(s): GLUCAP in the last 168 hours. D-Dimer No results for input(s): DDIMER in the last 72 hours. Hgb A1c No results for input(s): HGBA1C in the last 72 hours. Lipid Profile No results for input(s): CHOL, HDL, LDLCALC, TRIG, CHOLHDL, LDLDIRECT in the last 72 hours. Thyroid function studies No results for input(s): TSH, T4TOTAL, T3FREE, THYROIDAB in the last 72 hours.  Invalid input(s): FREET3 Anemia work up No results for input(s): VITAMINB12, FOLATE, FERRITIN, TIBC, IRON, RETICCTPCT in the last 72 hours. Urinalysis    Component Value Date/Time   COLORURINE AMBER (A) 01/25/2019 1823   APPEARANCEUR CLOUDY (A) 01/25/2019 1823   LABSPEC 1.025 01/25/2019 1823   PHURINE 6.0 01/25/2019 1823   GLUCOSEU NEGATIVE 01/25/2019 1823   HGBUR SMALL (A) 01/25/2019 1823   BILIRUBINUR NEGATIVE 01/25/2019 1823   KETONESUR 40 (A) 01/25/2019 1823   PROTEINUR 30 (A) 01/25/2019 1823   NITRITE POSITIVE (A) 01/25/2019 1823   LEUKOCYTESUR TRACE (A) 01/25/2019 1823   Sepsis Labs Invalid input(s): PROCALCITONIN,  WBC,  LACTICIDVEN Microbiology Recent Results (from the past 240 hour(s))  C difficile quick scan w PCR reflex     Status: None   Collection Time: 02/02/19  2:23 PM  Result Value Ref Range Status   C Diff antigen NEGATIVE NEGATIVE Final   C Diff toxin NEGATIVE NEGATIVE Final   C Diff interpretation No C. difficile detected.  Final    Comment: Performed at [facility], [place] [address], [place],  [address]     Time coordinating discharge: 35 minutes      SIGNED:   Edwin Dada, MD  [facility] 02/08/2019, 5:21 PM

## 2019-02-14 ENCOUNTER — Other Ambulatory Visit: Payer: Self-pay

## 2019-02-14 ENCOUNTER — Encounter (HOSPITAL_COMMUNITY): Payer: Self-pay

## 2019-02-14 ENCOUNTER — Inpatient Hospital Stay (HOSPITAL_COMMUNITY)
Admission: EM | Admit: 2019-02-14 | Discharge: 2019-02-19 | DRG: 392 | Disposition: A | Discharge status: Home / Self Care | Payer: Medicare Other | Attending: Internal Medicine | Admitting: Internal Medicine

## 2019-02-14 ENCOUNTER — Emergency Department (HOSPITAL_COMMUNITY): Payer: Medicare Other

## 2019-02-14 DIAGNOSIS — K5720 Diverticulitis of large intestine with perforation and abscess without bleeding: Secondary | ICD-10-CM | POA: Diagnosis not present

## 2019-02-14 DIAGNOSIS — J45909 Unspecified asthma, uncomplicated: Secondary | ICD-10-CM | POA: Diagnosis present

## 2019-02-14 DIAGNOSIS — M3500 Sicca syndrome, unspecified: Secondary | ICD-10-CM | POA: Diagnosis present

## 2019-02-14 DIAGNOSIS — Z7982 Long term (current) use of aspirin: Secondary | ICD-10-CM

## 2019-02-14 DIAGNOSIS — I10 Essential (primary) hypertension: Secondary | ICD-10-CM | POA: Diagnosis not present

## 2019-02-14 DIAGNOSIS — E611 Iron deficiency: Secondary | ICD-10-CM | POA: Diagnosis present

## 2019-02-14 DIAGNOSIS — Z803 Family history of malignant neoplasm of breast: Secondary | ICD-10-CM | POA: Diagnosis not present

## 2019-02-14 DIAGNOSIS — J4520 Mild intermittent asthma, uncomplicated: Secondary | ICD-10-CM | POA: Diagnosis not present

## 2019-02-14 DIAGNOSIS — Z91040 Latex allergy status: Secondary | ICD-10-CM

## 2019-02-14 DIAGNOSIS — R1032 Left lower quadrant pain: Secondary | ICD-10-CM | POA: Diagnosis not present

## 2019-02-14 DIAGNOSIS — N321 Vesicointestinal fistula: Secondary | ICD-10-CM | POA: Diagnosis present

## 2019-02-14 DIAGNOSIS — D649 Anemia, unspecified: Secondary | ICD-10-CM | POA: Diagnosis present

## 2019-02-14 DIAGNOSIS — Z79899 Other long term (current) drug therapy: Secondary | ICD-10-CM | POA: Diagnosis not present

## 2019-02-14 DIAGNOSIS — N311 Reflex neuropathic bladder, not elsewhere classified: Secondary | ICD-10-CM | POA: Diagnosis not present

## 2019-02-14 DIAGNOSIS — K651 Peritoneal abscess: Secondary | ICD-10-CM

## 2019-02-14 LAB — URINALYSIS, ROUTINE W REFLEX MICROSCOPIC
Bilirubin Urine: NEGATIVE
Glucose, UA: NEGATIVE mg/dL
Hgb urine dipstick: NEGATIVE
Ketones, ur: NEGATIVE mg/dL
Nitrite: NEGATIVE
Protein, ur: 30 mg/dL — AB
Specific Gravity, Urine: 1.026 (ref 1.005–1.030)
WBC, UA: >50 WBC/hpf — ABNORMAL HIGH (ref 0–5)
pH: 5 (ref 5.0–8.0)

## 2019-02-14 LAB — COMPREHENSIVE METABOLIC PANEL
ALT: 24 U/L (ref 0–44)
AST: 45 U/L — ABNORMAL HIGH (ref 15–41)
Albumin: 3.1 g/dL — ABNORMAL LOW (ref 3.5–5.0)
Alkaline Phosphatase: 126 U/L (ref 38–126)
Anion gap: 12 (ref 5–15)
BUN: 13 mg/dL (ref 8–23)
CO2: 21 mmol/L — ABNORMAL LOW (ref 22–32)
Calcium: 9.3 mg/dL (ref 8.9–10.3)
Chloride: 104 mmol/L (ref 98–111)
Creatinine, Ser: 0.86 mg/dL (ref 0.44–1.00)
GFR calc Af Amer: >60 mL/min (ref >60)
GFR calc non Af Amer: >60 mL/min (ref >60)
Glucose, Bld: 114 mg/dL — ABNORMAL HIGH (ref 70–99)
Potassium: 4.7 mmol/L (ref 3.5–5.1)
Sodium: 137 mmol/L (ref 135–145)
Total Bilirubin: 1.5 mg/dL — ABNORMAL HIGH (ref 0.3–1.2)
Total Protein: 7 g/dL (ref 6.5–8.1)

## 2019-02-14 LAB — CBC
HCT: 36.8 % (ref 36.0–46.0)
Hemoglobin: 11.8 g/dL — ABNORMAL LOW (ref 12.0–15.0)
MCH: 27.5 pg (ref 26.0–34.0)
MCHC: 32.1 g/dL (ref 30.0–36.0)
MCV: 85.8 fL (ref 80.0–100.0)
Platelets: 576 10*3/uL — ABNORMAL HIGH (ref 150–400)
RBC: 4.29 MIL/uL (ref 3.87–5.11)
RDW: 13.2 % (ref 11.5–15.5)
WBC: 22.4 10*3/uL — ABNORMAL HIGH (ref 4.0–10.5)
nRBC: 0 % (ref 0.0–0.2)

## 2019-02-14 LAB — LIPASE, BLOOD: Lipase: 31 U/L (ref 11–51)

## 2019-02-14 IMAGING — CT CT ABDOMEN AND PELVIS WITH CONTRAST
2 of 5 series · 16 of 46 positions shown, 18 images · IV contrast (APPLIED)
Comparison: [DATE].

CLINICAL DATA: Patient was recently admitted for diverticulitis
with continue abdomen pain.

EXAM:
CT ABDOMEN AND PELVIS WITH CONTRAST
TECHNIQUE: Multidetector CT imaging of the abdomen and pelvis was performed
using the standard protocol following bolus administration of
intravenous contrast.
CONTRAST:  100mL OMNIPAQUE IOHEXOL 300 MG/ML  SOLN

[Series 3: abd/ pelvis 5.0 i30f 2 · axial · 0.72mm/px · z∈[+664,+1090]mm · 13 of 97 slices shown, 15 images]
[im 6/97  soft-tissue]
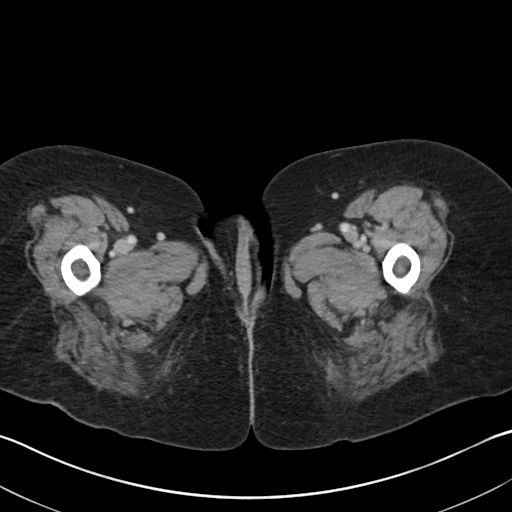
[im 6/97  bone]
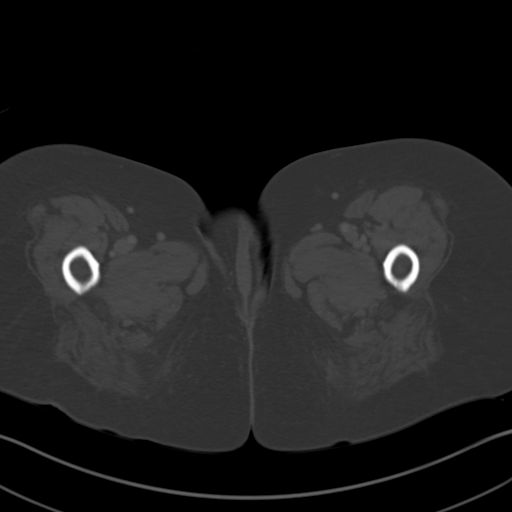
[im 11/97  soft-tissue]
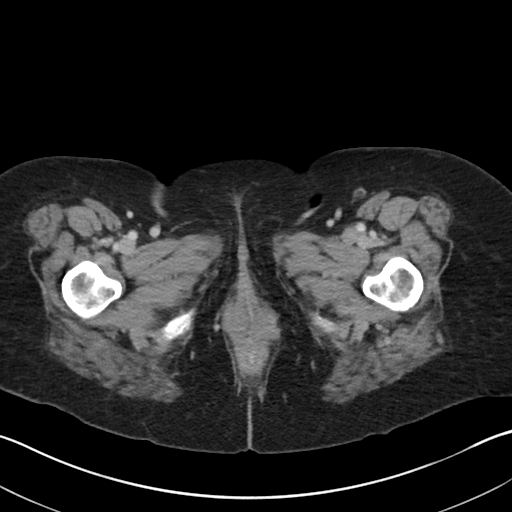
[im 22/97  soft-tissue]
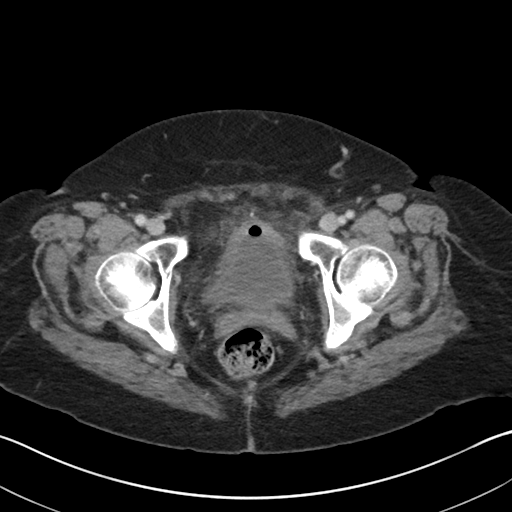
[im 27/97  soft-tissue]
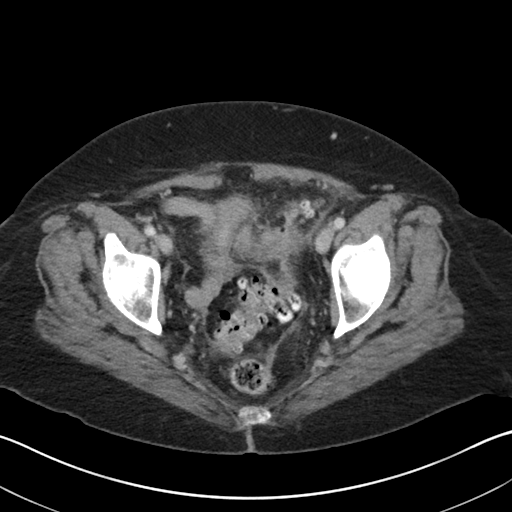
[im 33/97  soft-tissue]
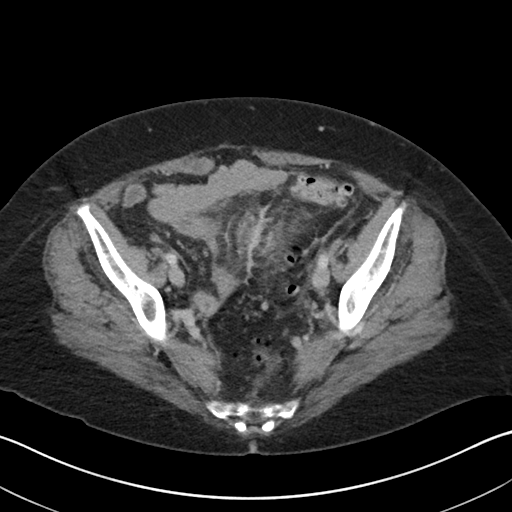
[im 43/97  soft-tissue]
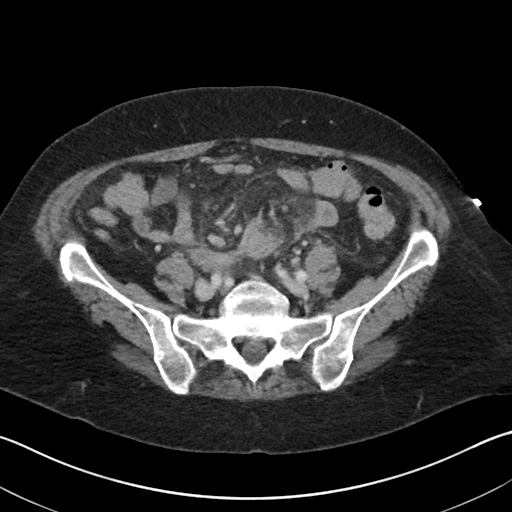
[im 49/97  soft-tissue]
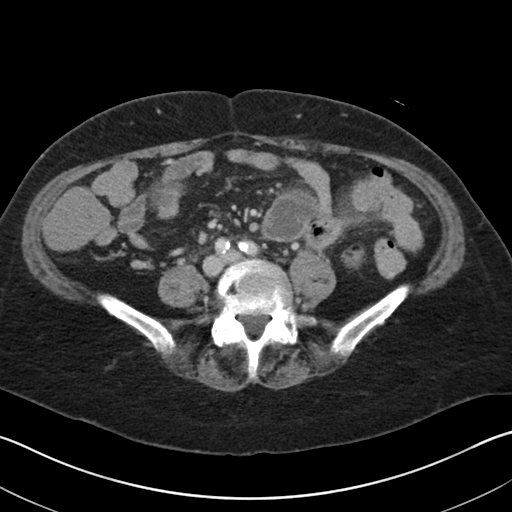
[im 54/97  soft-tissue]
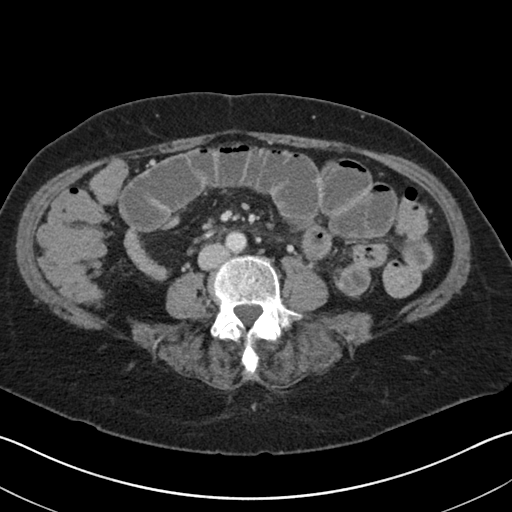
[im 65/97  soft-tissue]
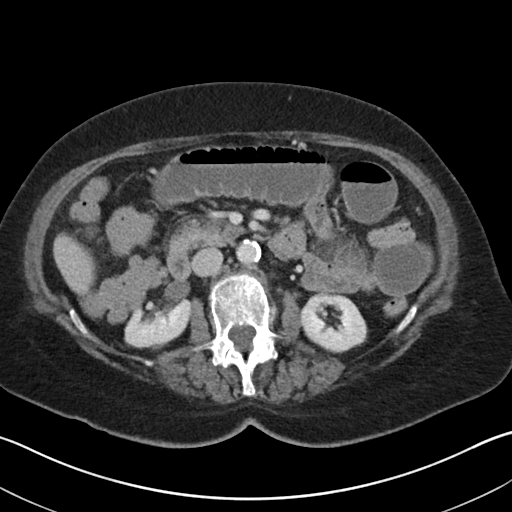
[im 65/97  bone]
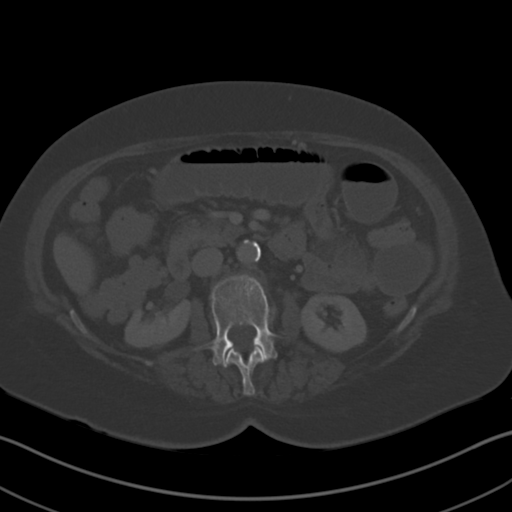
[im 70/97  soft-tissue]
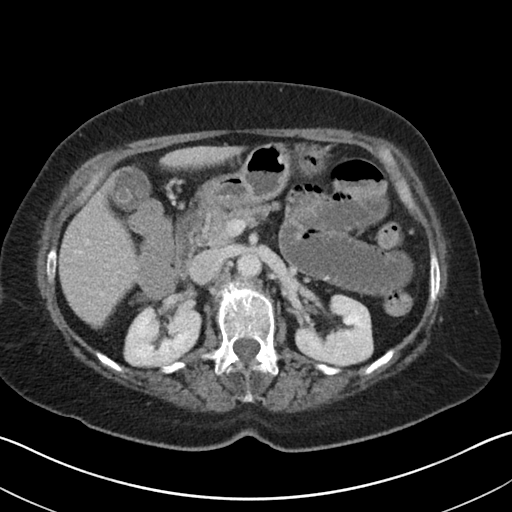
[im 75/97  soft-tissue]
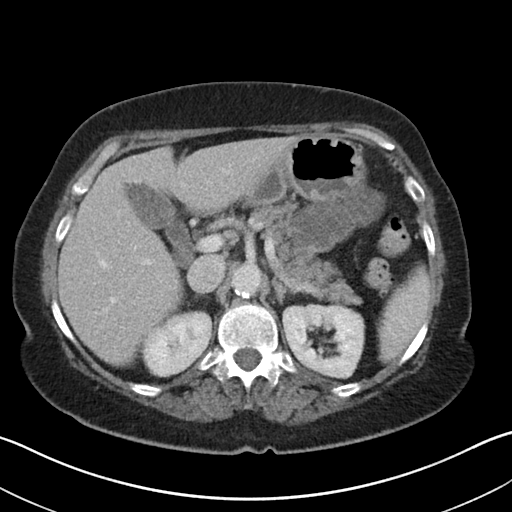
[im 86/97  soft-tissue]
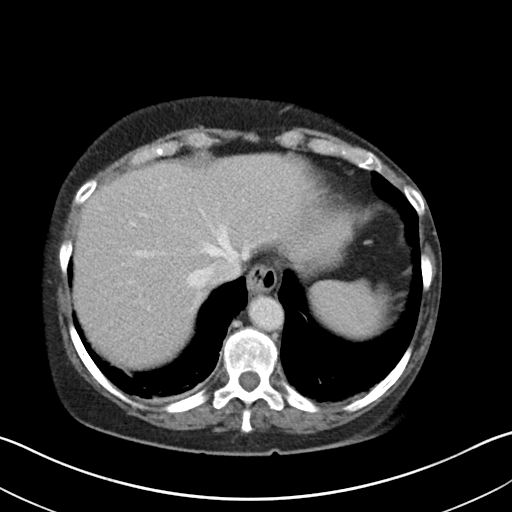
[im 91/97  soft-tissue]
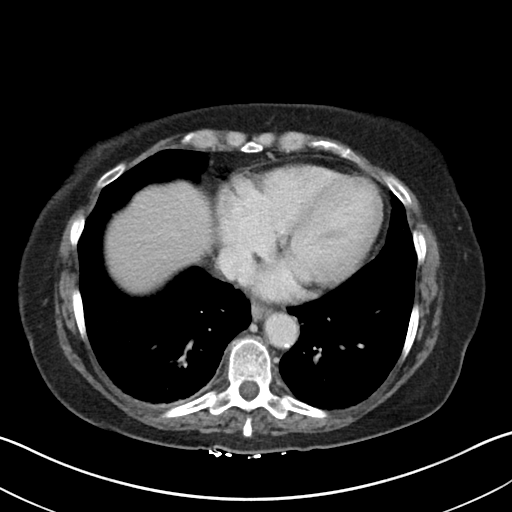

[Series 6: coronal soft tissue · coronal · 0.87mm/px · 3 of 98 slices shown]
[im 33/98  soft-tissue]
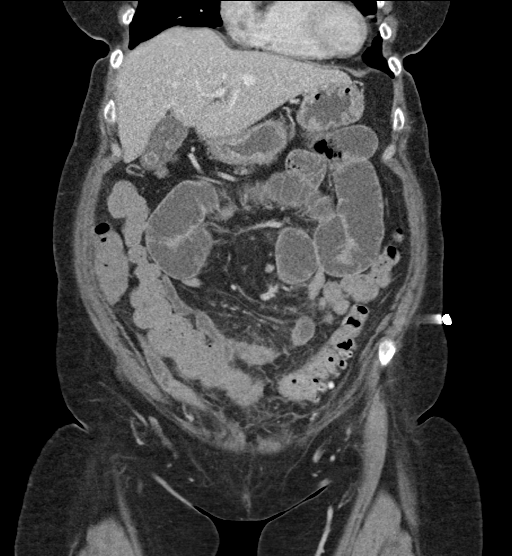
[im 44/98  soft-tissue]
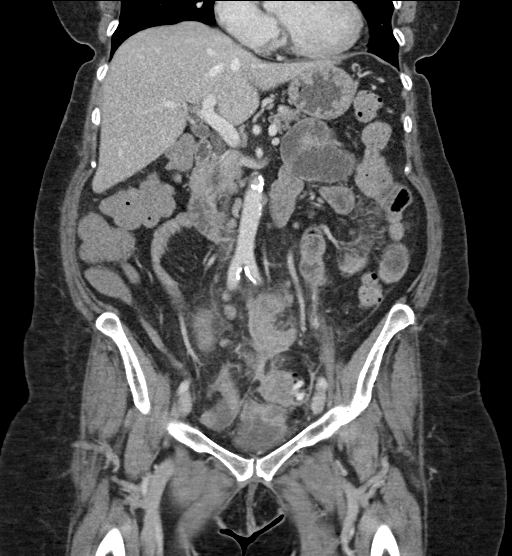
[im 54/98  soft-tissue]
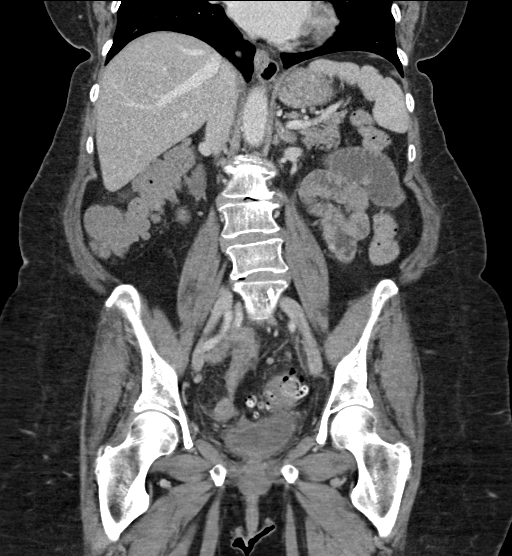

[16 of 46 positions shown; findings below may reference images not displayed]

FINDINGS: Lower chest: Minimal atelectasis of the lung bases are noted. The
heart size is normal.

Hepatobiliary: No focal liver abnormality is seen. Gallstones are
identified in the gallbladder. No inflammatory changes noted around
the gallbladder. No biliary dilatation.

Pancreas: Unremarkable. No pancreatic ductal dilatation or
surrounding inflammatory changes.

Spleen: Normal in size without focal abnormality.

Adrenals/Urinary Tract: Adrenal glands are unremarkable. Kidneys are
normal, without renal calculi, focal lesion, or hydronephrosis. The
previously noted enhancing fluid collection at the dome of the
bladder is stable in size but the collection currently has air
suggesting focal abscess.

Stomach/Bowel: Inflammatory changes surrounding the proximal sigmoid
colon is slightly worsened. There are multiple moderate distended
small bowel loops in the upper and mid abdomen with transition in
the lower mid abdomen at the site of mesentery inflammation of
nearby sigmoid colon suggesting possible developing small bowel
obstruction.

The stomach is normal.  The appendix is normal.

Vascular/Lymphatic: Aortic atherosclerosis. No enlarged abdominal or
pelvic lymph nodes.

Reproductive: Uterus and bilateral adnexa are unremarkable.

Other: The previously seen small abscesses in lower abdomen are
probably not significantly changed in size but there is current air
formation within the abscess not previously seen.

Musculoskeletal: Degenerative joint changes of the spine are
identified.
IMPRESSION: Inflammatory changes surrounding the proximal sigmoid colon is
slightly worsened compared to prior exam with worsened interval
question developing small bowel obstruction as described. The
previously noted small abscesses in the lower abdomen probably not
significantly changed in size but there is current air formation
noted within the abscesses not previously seen.

## 2019-02-14 MED ORDER — ACETAMINOPHEN 650 MG RE SUPP: 650.0000 mg | Freq: Four times a day (QID) | RECTAL | Status: DC | PRN

## 2019-02-14 MED ORDER — SODIUM CHLORIDE 0.9 % IV BOLUS
1000.0000 mL | Freq: Once | INTRAVENOUS | Status: AC
  Administered 2019-02-14: 1000 mL via INTRAVENOUS

## 2019-02-14 MED ORDER — CIPROFLOXACIN IN D5W 400 MG/200ML IV SOLN
400.0000 mg | Freq: Two times a day (BID) | INTRAVENOUS | Status: DC
  Administered 2019-02-14 – 2019-02-18 (×8): 400 mg via INTRAVENOUS
  Filled 2019-02-14 (×8): qty 200

## 2019-02-14 MED ORDER — ONDANSETRON HCL 4 MG/2ML IJ SOLN
4.0000 mg | Freq: Four times a day (QID) | INTRAMUSCULAR | Status: DC | PRN
  Administered 2019-02-14 – 2019-02-17 (×7): 4 mg via INTRAVENOUS
  Filled 2019-02-14 (×7): qty 2

## 2019-02-14 MED ORDER — ACETAMINOPHEN 325 MG PO TABS
650.0000 mg | ORAL_TABLET | Freq: Four times a day (QID) | ORAL | Status: DC | PRN
  Filled 2019-02-14: qty 2

## 2019-02-14 MED ORDER — MELATONIN 3 MG PO TABS
9.0000 mg | ORAL_TABLET | Freq: Every evening | ORAL | Status: DC | PRN
  Filled 2019-02-14: qty 3

## 2019-02-14 MED ORDER — SODIUM CHLORIDE 0.9% FLUSH
3.0000 mL | Freq: Two times a day (BID) | INTRAVENOUS | Status: DC
  Administered 2019-02-14 – 2019-02-19 (×4): 3 mL via INTRAVENOUS

## 2019-02-14 MED ORDER — SODIUM CHLORIDE 0.9 % IV SOLN: 250.0000 mL | INTRAVENOUS | Status: DC | PRN

## 2019-02-14 MED ORDER — IOHEXOL 300 MG/ML  SOLN
100.0000 mL | Freq: Once | INTRAMUSCULAR | Status: AC | PRN
  Administered 2019-02-14: 100 mL via INTRAVENOUS

## 2019-02-14 MED ORDER — OXYCODONE HCL 5 MG PO TABS
10.0000 mg | ORAL_TABLET | Freq: Four times a day (QID) | ORAL | Status: DC | PRN
  Filled 2019-02-14: qty 2

## 2019-02-14 MED ORDER — METRONIDAZOLE IN NACL 5-0.79 MG/ML-% IV SOLN
500.0000 mg | Freq: Three times a day (TID) | INTRAVENOUS | Status: DC
  Administered 2019-02-14 – 2019-02-18 (×10): 500 mg via INTRAVENOUS
  Filled 2019-02-14 (×11): qty 100

## 2019-02-14 MED ORDER — MONTELUKAST SODIUM 10 MG PO TABS
10.0000 mg | ORAL_TABLET | Freq: Every day | ORAL | Status: DC
  Administered 2019-02-14 – 2019-02-18 (×5): 10 mg via ORAL
  Filled 2019-02-14 (×5): qty 1

## 2019-02-14 MED ORDER — ONDANSETRON HCL 4 MG/2ML IJ SOLN
4.0000 mg | Freq: Once | INTRAMUSCULAR | Status: AC
  Administered 2019-02-14: 4 mg via INTRAVENOUS
  Filled 2019-02-14: qty 2

## 2019-02-14 MED ORDER — PANTOPRAZOLE SODIUM 40 MG PO TBEC
40.0000 mg | DELAYED_RELEASE_TABLET | Freq: Every day | ORAL | Status: DC
  Administered 2019-02-14 – 2019-02-19 (×6): 40 mg via ORAL
  Filled 2019-02-14 (×6): qty 1

## 2019-02-14 MED ORDER — SODIUM CHLORIDE 0.9% FLUSH: 3.0000 mL | INTRAVENOUS | Status: DC | PRN

## 2019-02-14 MED ORDER — ONDANSETRON HCL 4 MG PO TABS: 4.0000 mg | ORAL_TABLET | Freq: Four times a day (QID) | ORAL | Status: DC | PRN

## 2019-02-14 MED ORDER — HYDROMORPHONE HCL 1 MG/ML IJ SOLN
0.5000 mg | INTRAMUSCULAR | Status: DC | PRN
  Administered 2019-02-14 – 2019-02-18 (×5): 0.5 mg via INTRAVENOUS
  Filled 2019-02-14 (×6): qty 1

## 2019-02-14 MED ORDER — HYDRALAZINE HCL 20 MG/ML IJ SOLN
10.0000 mg | Freq: Four times a day (QID) | INTRAMUSCULAR | Status: DC | PRN
  Administered 2019-02-17 – 2019-02-18 (×2): 10 mg via INTRAVENOUS
  Filled 2019-02-14 (×2): qty 1

## 2019-02-14 MED ORDER — POLYETHYLENE GLYCOL 3350 17 G PO PACK: 17.0000 g | PACK | Freq: Every day | ORAL | Status: DC | PRN

## 2019-02-14 MED ORDER — ALBUTEROL SULFATE (2.5 MG/3ML) 0.083% IN NEBU: 2.5000 mg | INHALATION_SOLUTION | RESPIRATORY_TRACT | Status: DC | PRN

## 2019-02-14 MED ORDER — PIPERACILLIN-TAZOBACTAM 3.375 G IVPB 30 MIN
3.3750 g | Freq: Once | INTRAVENOUS | Status: DC
  Filled 2019-02-14: qty 50

## 2019-02-14 MED ORDER — ASPIRIN EC 81 MG PO TBEC
81.0000 mg | DELAYED_RELEASE_TABLET | Freq: Every evening | ORAL | Status: DC
  Administered 2019-02-15: 81 mg via ORAL
  Filled 2019-02-14: qty 1

## 2019-02-14 MED ORDER — CALCIUM CARBONATE 1250 (500 CA) MG PO TABS
1250.0000 mg | ORAL_TABLET | Freq: Two times a day (BID) | ORAL | Status: DC
  Filled 2019-02-14 (×3): qty 1

## 2019-02-14 MED ORDER — SODIUM CHLORIDE 0.9 % IV SOLN
INTRAVENOUS | Status: DC
  Administered 2019-02-14 – 2019-02-19 (×8): via INTRAVENOUS

## 2019-02-14 MED ORDER — SODIUM CHLORIDE 0.9% FLUSH
3.0000 mL | Freq: Once | INTRAVENOUS | Status: AC
  Administered 2019-02-14: 3 mL via INTRAVENOUS

## 2019-02-14 MED ORDER — HEPARIN SODIUM (PORCINE) 5000 UNIT/ML IJ SOLN
5000.0000 [IU] | Freq: Three times a day (TID) | INTRAMUSCULAR | Status: DC
  Administered 2019-02-14 – 2019-02-19 (×14): 5000 [IU] via SUBCUTANEOUS
  Filled 2019-02-14 (×14): qty 1

## 2019-02-14 MED ORDER — MORPHINE SULFATE (PF) 4 MG/ML IV SOLN
4.0000 mg | Freq: Once | INTRAVENOUS | Status: DC
  Filled 2019-02-14: qty 1

## 2019-02-14 NOTE — H&P (Signed)
Patient Demographics:    Denise Payne, is a 73 y.o. 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 repeat CT abdomen and pelvis shows worsening findings including air formation noted within the abscess , patient was recently admitted from 01/24/2021 02/08/2019 for same, treated with IV Zosyn and discharged home on p.o. Augmentin..... General surgery consult from Dr. Marlou Starks appreciated---patient will be started on IV Cipro and Flagyl pending cultures - Urologyplanning to follow up as outpatient.Would perform concomitant surgery with gen surg when indicated. Give clear liquid diet, PRN antiemetics and opiates  2)Asthma: No acute exacerbation--- Continue singulair and prn neb  3)Sjogren syndrome: Stable, -patient has been on Plaquenil for 8 years, consider holding plaquenil..... Given persistent infection  4)Normocytic anemia--- hemoglobin currently higher than recent baseline most likely due to hemoconcentration,  suspected chronic anemia secondary to to be due to chronic disease and Plaquenil use, anemia work-up reveals some degree of iron deficiency, ferritin is normal, serum iron is low at 14, iron saturation is low at 7, with a low TIBC of 034, J17 and folic acid are not low.  No evidence of ongoing blood loss or  bleeding,    5)H/o HTN: No BP medications PTA. use Hydralazine IV prn   With History of - Reviewed by me  Past Medical History:  Diagnosis Date   Asthma    Sjogren's syndrome ([place]) 01/26/2019      Past Surgical History:  Procedure Laterality Date   AUGMENTATION MAMMAPLASTY Bilateral 10/16/1976    Chief Complaint  Patient presents with   Abdominal Pain   Emesis      HPI:    Denise Payne  is a 73 y.o. female  retired Therapist, sports with a history of persistent asthma and Sjogren's syndrome on plaquenil presents to the ED with abdominal pain and persistent emesis without blood but with occasional bile.... No fevers no chills  Patient was admitted on 01/25/2019 with acute sigmoid diverticulitis with microperforation and colovesical fistula----treated with IV Zosyn and discharged home on Augmentin on 02/08/2019-----  No fevers......., No tachypnea  No dysuria or hematuria   in ED.....WBC is up to 22.4 from 12 .6 about  6 days ago... repeat CT abdomen and pelvis shows worsening findings including air formation noted within the abscess --- Surgical consult from Dr. Autumn Messing appreciated, recommends IV Cipro and Flagyl  Admission to hospital service requested    Review of systems:    In addition to the HPI above,  A full Review of  Systems was done, all other systems reviewed are negative except as noted above in HPI , .    Social History:  Reviewed by me    Social History   Tobacco Use   Smoking status: Never Smoker   Smokeless tobacco: Never Used  Substance Use Topics   Alcohol use: Yes    Alcohol/week: 1.0 standard drinks    Types: 1 Glasses of wine per week     Family History :  Reviewed by me    Family History  Problem Relation Age of Onset   Breast cancer Mother 25     Home Medications:   Prior to Admission medications   Medication Sig Start Date End Date Taking? Authorizing Provider  albuterol (PROVENTIL HFA;VENTOLIN HFA) 108 (90 Base) MCG/ACT  inhaler Inhale 2 puffs into the lungs every 6 (six) hours as needed for wheezing or shortness of breath.    Yes [provider]  aspirin EC 81 MG tablet Take 81 mg by mouth every evening.    Yes [provider]  calcium carbonate (CALCIUM 600) 600 MG TABS tablet Take 600 mg by mouth 2 (two) times daily with a meal.    [provider]  Cholecalciferol (VITAMIN D) 50 MCG (2000 UT) tablet Take 2,000 Units by mouth daily.    [provider]  cyclobenzaprine (FLEXERIL) 10 MG tablet Take by mouth.    [provider]  diclofenac sodium (VOLTAREN) 1 % GEL Apply 2 g topically 2 (two) times daily as needed (pain).     [provider]  esomeprazole (NEXIUM) 40 MG capsule Take 40 mg by mouth daily.     [provider]  Melatonin 10 MG TABS Take 10 mg by mouth at bedtime as needed (sleep).     [provider]  montelukast (SINGULAIR) 10 MG tablet Take 10 mg by mouth at bedtime.     [provider]  naproxen sodium (ALEVE) 220 MG tablet Take 220 mg by mouth as needed (pain).    [provider]  oxyCODONE (OXY IR/ROXICODONE) 5 MG immediate release tablet Take 1 tablet (5 mg total) by mouth every 6 (six) hours as needed (5mg  for moderate pain, 10mg  for severe pain). Patient taking differently: Take 5-10 mg by mouth every 6 (six) hours as needed (5mg  for moderate pain, 10mg  for severe pain).  02/08/19   Danford, Suann Larry, MD  vitamin B-12 (CYANOCOBALAMIN) 1000 MCG tablet Take 1,000 mcg by mouth daily.     [provider]     Allergies:     Allergies  Allergen Reactions   Latex Shortness Of Breath and Swelling    Lip and eye swelling   Onion Diarrhea and Other (See Comments)    Stomach cramps     Physical Exam:   Vitals  Blood pressure (!) 144/68, pulse 98, temperature 98 F (36.7 C), temperature source Oral, resp. rate 17, height 5' 2.5" (1.588 m), weight 78.9 kg, SpO2 96 %.  Physical Examination:  General appearance - alert, well appearing, and in no distress and  Mental status - alert, oriented to person, place, and time,  HEENT:- [place].AT, No sclera icterus Neck-Supple Neck,No JVD,.  Lungs-  CTAB , fair symmetrical air movement CV- S1, S2 normal, regular  Abd-  +ve B.Sounds, Abd Soft, lower abdominal tenderness especially in the left lower quadrant without rebound or guarding, not distended Extremity/Skin:- No  edema, pedal pulses present  Psych-affect is appropriate, oriented x3 Neuro-no  new focal deficits, no tremors     Data Review:    CBC Recent Labs  Lab 02/08/19 0354 02/14/19 1236  WBC 12.1* 22.4*  HGB 9.6* 11.8*  HCT 30.3* 36.8  PLT 545* 576*  MCV 86.3 85.8  MCH 27.4 27.5  MCHC 31.7 32.1  RDW 13.5 13.2   ------------------------------------------------------------------------------------------------------------------  Chemistries  Recent Labs  Lab 02/08/19 0354 02/14/19 1236  NA 137 137  K 3.8 4.7  CL 105 104  CO2 22 21*  GLUCOSE 93 114*  BUN 6* 13  CREATININE 0.76 0.86  CALCIUM 8.6* 9.3  AST  --  45*  ALT  --  24  ALKPHOS  --  126  BILITOT  --  1.5*   ------------------------------------------------------------------------------------------------------------------ estimated creatinine clearance is 57.3 mL/min (by C-G formula based on SCr of 0.86 mg/dL). ------------------------------------------------------------------------------------------------------------------ No results for input(s): TSH, T4TOTAL, T3FREE, THYROIDAB in the last 72 hours.  Invalid input(s): FREET3   Coagulation profile No results for input(s): INR, PROTIME in the last 168 hours. ------------------------------------------------------------------------------------------------------------------- No results for input(s): DDIMER in the last 72 hours. -------------------------------------------------------------------------------------------------------------------  Cardiac  Enzymes No results for input(s): CKMB, TROPONINI, MYOGLOBIN in the last 168 hours.  Invalid input(s): CK ------------------------------------------------------------------------------------------------------------------ No results found for: BNP   ---------------------------------------------------------------------------------------------------------------  Urinalysis    Component Value Date/Time   COLORURINE AMBER (A) 02/14/2019 1303   APPEARANCEUR HAZY (A) 02/14/2019 1303   LABSPEC 1.026 02/14/2019 1303   PHURINE 5.0 02/14/2019 1303   GLUCOSEU NEGATIVE 02/14/2019 1303   [place] 02/14/2019 1303   [place] 02/14/2019 1303   KETONESUR NEGATIVE 02/14/2019 1303   PROTEINUR 30 (A) 02/14/2019 1303   NITRITE NEGATIVE 02/14/2019 1303   LEUKOCYTESUR MODERATE (A) 02/14/2019 1303    ----------------------------------------------------------------------------------------------------------------   Imaging Results:    Ct Abdomen Pelvis W Contrast  Result Date: 02/14/2019 CLINICAL DATA:  Patient was recently admitted for diverticulitis with continue abdomen pain. EXAM: CT ABDOMEN AND PELVIS WITH CONTRAST TECHNIQUE: Multidetector CT imaging of the abdomen and pelvis was performed using the standard protocol following bolus administration of intravenous contrast. CONTRAST:  151mL OMNIPAQUE IOHEXOL 300 MG/ML  SOLN COMPARISON:  February 07, 2019. FINDINGS: Lower chest: Minimal atelectasis of the lung bases are noted. The heart size is normal. Hepatobiliary: No focal liver abnormality is seen. Gallstones are identified in the gallbladder. No inflammatory changes noted around the gallbladder. No biliary dilatation. Pancreas: Unremarkable. No pancreatic ductal dilatation or surrounding inflammatory changes. Spleen: Normal in size without focal abnormality. Adrenals/Urinary Tract: Adrenal glands are unremarkable. Kidneys are normal, without renal calculi, focal lesion, or hydronephrosis.  The previously noted enhancing fluid collection at the dome of the bladder is stable in size but the collection currently has air suggesting focal abscess. Stomach/Bowel: Inflammatory changes surrounding the proximal sigmoid colon is slightly worsened. There are multiple moderate distended small bowel loops in the upper and mid abdomen with transition in the lower mid abdomen at the site of mesentery inflammation of nearby sigmoid colon suggesting possible developing small bowel obstruction. The stomach is normal.  The appendix is normal. Vascular/Lymphatic: Aortic atherosclerosis. No enlarged abdominal or pelvic lymph nodes. Reproductive: Uterus and bilateral adnexa are unremarkable. Other: The previously seen small abscesses in lower abdomen are probably not significantly changed in size but there is current air formation within the abscess not previously seen. Musculoskeletal: Degenerative joint changes of the spine are identified. IMPRESSION: Inflammatory changes surrounding the proximal sigmoid colon is slightly worsened compared to prior exam with worsened interval question developing small  bowel obstruction as described. The previously noted small abscesses in the lower abdomen probably not significantly changed in size but there is current air formation noted within the abscesses not previously seen. Electronically Signed   By: Abelardo Diesel M.D.   On: 02/14/2019 15:57    Radiological Exams on Admission: Ct Abdomen Pelvis W Contrast  Result Date: 02/14/2019 CLINICAL DATA:  Patient was recently admitted for diverticulitis with continue abdomen pain. EXAM: CT ABDOMEN AND PELVIS WITH CONTRAST TECHNIQUE: Multidetector CT imaging of the abdomen and pelvis was performed using the standard protocol following bolus administration of intravenous contrast. CONTRAST:  174mL OMNIPAQUE IOHEXOL 300 MG/ML  SOLN COMPARISON:  February 07, 2019. FINDINGS: Lower chest: Minimal atelectasis of the lung bases are noted. The  heart size is normal. Hepatobiliary: No focal liver abnormality is seen. Gallstones are identified in the gallbladder. No inflammatory changes noted around the gallbladder. No biliary dilatation. Pancreas: Unremarkable. No pancreatic ductal dilatation or surrounding inflammatory changes. Spleen: Normal in size without focal abnormality. Adrenals/Urinary Tract: Adrenal glands are unremarkable. Kidneys are normal, without renal calculi, focal lesion, or hydronephrosis. The previously noted enhancing fluid collection at the dome of the bladder is stable in size but the collection currently has air suggesting focal abscess. Stomach/Bowel: Inflammatory changes surrounding the proximal sigmoid colon is slightly worsened. There are multiple moderate distended small bowel loops in the upper and mid abdomen with transition in the lower mid abdomen at the site of mesentery inflammation of nearby sigmoid colon suggesting possible developing small bowel obstruction. The stomach is normal.  The appendix is normal. Vascular/Lymphatic: Aortic atherosclerosis. No enlarged abdominal or pelvic lymph nodes. Reproductive: Uterus and bilateral adnexa are unremarkable. Other: The previously seen small abscesses in lower abdomen are probably not significantly changed in size but there is current air formation within the abscess not previously seen. Musculoskeletal: Degenerative joint changes of the spine are identified. IMPRESSION: Inflammatory changes surrounding the proximal sigmoid colon is slightly worsened compared to prior exam with worsened interval question developing small bowel obstruction as described. The previously noted small abscesses in the lower abdomen probably not significantly changed in size but there is current air formation noted within the abscesses not previously seen. Electronically Signed   By: Abelardo Diesel M.D.   On: 02/14/2019 15:57    DVT Prophylaxis -SCD  /heparin AM Labs Ordered, also please review  Full Orders  Family Communication: Admission, patients condition and plan of care including tests being ordered have been discussed with the patient  who indicate understanding and agree with the plan   Code Status - Full Code  Likely DC to  home  Condition   Stable   Roxan Hockey M.D on 02/14/2019 at 6:30 PM Go to [email or web address] -  for contact info  [facility] - Office  [phone]

## 2019-02-14 NOTE — Progress Notes (Signed)
Admitted from ED complaining of abdominal pain. Oriented to unit and staff, plan of care discussed with patient, zofran given for nausea.will continue to monitor.

## 2019-02-14 NOTE — Consult Note (Signed)
Reason for Consult:abd pain Referring Physician: Dr. Braxton Feathers Snowberger is an 73 y.o. female.  HPI: The patient is a 73 year old white female who was discharged from the hospital last Saturday after a 2-week course of treatment for diverticulitis with microperforation.  She was treated with IV Zosyn.  She had some improvement but at the time of discharge still had a white count of 12,000 and was still having some crampy abdominal pain.  She was discharged on Augmentin and during the week she has had worsening of her crampy abdominal pain.  She denies any fevers or chills.  On Wednesday of this week she began having nausea and vomiting.  She came back to the emergency department today where a CT scan shows some slight worsening of her diverticulitis with a small developing abscess.  Past Medical History:  Diagnosis Date  . Asthma   . Sjogren's syndrome ([place]) 01/26/2019    Past Surgical History:  Procedure Laterality Date  . AUGMENTATION MAMMAPLASTY Bilateral 10/16/1976    Family History  Problem Relation Age of Onset  . Breast cancer Mother 48    Social History:  reports that she has never smoked. She has never used smokeless tobacco. She reports current alcohol use of about 1.0 standard drinks of alcohol per week. She reports that she does not use drugs.  Allergies:  Allergies  Allergen Reactions  . Latex Shortness Of Breath and Swelling    Lip and eye swelling  . Onion Diarrhea and Other (See Comments)    Stomach cramps    Medications: I have reviewed the patient's current medications.  Results for orders placed or performed during the hospital encounter of 02/14/19 (from the past 48 hour(s))  Lipase, blood     Status: None   Collection Time: 02/14/19 12:36 PM  Result Value Ref Range   Lipase 31 11 - 51 U/L    Comment: Performed at [facility], [place] [address], [place], [place] [address]  Comprehensive metabolic panel     Status: Abnormal   Collection Time:  02/14/19 12:36 PM  Result Value Ref Range   Sodium 137 135 - 145 mmol/L   Potassium 4.7 3.5 - 5.1 mmol/L   Chloride 104 98 - 111 mmol/L   CO2 21 (L) 22 - 32 mmol/L   Glucose, Bld 114 (H) 70 - 99 mg/dL   BUN 13 8 - 23 mg/dL   Creatinine, Ser 0.86 0.44 - 1.00 mg/dL   Calcium 9.3 8.9 - 10.3 mg/dL   Total Protein 7.0 6.5 - 8.1 g/dL   Albumin 3.1 (L) 3.5 - 5.0 g/dL   AST 45 (H) 15 - 41 U/L   ALT 24 0 - 44 U/L   Alkaline Phosphatase 126 38 - 126 U/L   Total Bilirubin 1.5 (H) 0.3 - 1.2 mg/dL   GFR calc non Af Amer >60 >60 mL/min   GFR calc Af Amer >60 >60 mL/min   Anion gap 12 5 - 15    Comment: Performed at [facility] [address] [place] [address]  CBC     Status: Abnormal   Collection Time: 02/14/19 12:36 PM  Result Value Ref Range   WBC 22.4 (H) 4.0 - 10.5 K/uL   RBC 4.29 3.87 - 5.11 MIL/uL   Hemoglobin 11.8 (L) 12.0 - 15.0 g/dL   HCT 36.8 36.0 - 46.0 %   MCV 85.8 80.0 - 100.0 fL   MCH 27.5 26.0 - 34.0 pg  MCHC 32.1 30.0 - 36.0 g/dL   RDW 13.2 11.5 - 15.5 %   Platelets 576 (H) 150 - 400 K/uL   nRBC 0.0 0.0 - 0.2 %    Comment: Performed at [facility], [place] [address], [place], [place] [address]  Urinalysis, Routine w reflex microscopic     Status: Abnormal   Collection Time: 02/14/19  1:03 PM  Result Value Ref Range   Color, Urine AMBER (A) YELLOW    Comment: BIOCHEMICALS MAY BE AFFECTED BY COLOR   APPearance HAZY (A) CLEAR   Specific Gravity, Urine 1.026 1.005 - 1.030   pH 5.0 5.0 - 8.0   Glucose, UA NEGATIVE NEGATIVE mg/dL   Hgb urine dipstick NEGATIVE NEGATIVE   Bilirubin Urine NEGATIVE NEGATIVE   Ketones, ur NEGATIVE NEGATIVE mg/dL   Protein, ur 30 (A) NEGATIVE mg/dL   Nitrite NEGATIVE NEGATIVE   Leukocytes,Ua MODERATE (A) NEGATIVE   RBC / HPF 0-5 0 - 5 RBC/hpf   WBC, UA >50 (H) 0 - 5 WBC/hpf   Bacteria, UA RARE (A) NONE SEEN   Squamous Epithelial / LPF 0-5 0 - 5   WBC Clumps PRESENT    Mucus PRESENT    Budding Yeast PRESENT     Hyaline Casts, UA PRESENT     Comment: Performed at [facility], [place] [address] [place], [place] [address]    Ct Abdomen Pelvis W Contrast  Result Date: 02/14/2019 CLINICAL DATA:  Patient was recently admitted for diverticulitis with continue abdomen pain. EXAM: CT ABDOMEN AND PELVIS WITH CONTRAST TECHNIQUE: Multidetector CT imaging of the abdomen and pelvis was performed using the standard protocol following bolus administration of intravenous contrast. CONTRAST:  162mL OMNIPAQUE IOHEXOL 300 MG/ML  SOLN COMPARISON:  February 07, 2019. FINDINGS: Lower chest: Minimal atelectasis of the lung bases are noted. The heart size is normal. Hepatobiliary: No focal liver abnormality is seen. Gallstones are identified in the gallbladder. No inflammatory changes noted around the gallbladder. No biliary dilatation. Pancreas: Unremarkable. No pancreatic ductal dilatation or surrounding inflammatory changes. Spleen: Normal in size without focal abnormality. Adrenals/Urinary Tract: Adrenal glands are unremarkable. Kidneys are normal, without renal calculi, focal lesion, or hydronephrosis. The previously noted enhancing fluid collection at the dome of the bladder is stable in size but the collection currently has air suggesting focal abscess. Stomach/Bowel: Inflammatory changes surrounding the proximal sigmoid colon is slightly worsened. There are multiple moderate distended small bowel loops in the upper and mid abdomen with transition in the lower mid abdomen at the site of mesentery inflammation of nearby sigmoid colon suggesting possible developing small bowel obstruction. The stomach is normal.  The appendix is normal. Vascular/Lymphatic: Aortic atherosclerosis. No enlarged abdominal or pelvic lymph nodes. Reproductive: Uterus and bilateral adnexa are unremarkable. Other: The previously seen small abscesses in lower abdomen are probably not significantly changed in size but there is current air formation within the  abscess not previously seen. Musculoskeletal: Degenerative joint changes of the spine are identified. IMPRESSION: Inflammatory changes surrounding the proximal sigmoid colon is slightly worsened compared to prior exam with worsened interval question developing small bowel obstruction as described. The previously noted small abscesses in the lower abdomen probably not significantly changed in size but there is current air formation noted within the abscesses not previously seen. Electronically Signed   By: Abelardo Diesel M.D.   On: 02/14/2019 15:57    Review of Systems  Constitutional: Negative.   HENT: Negative.   Eyes: Negative.  Respiratory: Negative.   Cardiovascular: Negative.   Gastrointestinal: Positive for abdominal pain, nausea and vomiting.  Genitourinary: Negative.   Musculoskeletal: Negative.   Skin: Negative.   Neurological: Negative.   Endo/Heme/Allergies: Negative.   Psychiatric/Behavioral: Negative.    Blood pressure (!) 144/59, pulse 94, temperature 98.2 F (36.8 C), temperature source Oral, resp. rate 16, SpO2 100 %. Physical Exam  Constitutional: She is oriented to person, place, and time. She appears well-developed and well-nourished. No distress.  HENT:  Head: Normocephalic and atraumatic.  Mouth/Throat: No oropharyngeal exudate.  Eyes: Pupils are equal, round, and reactive to light. Conjunctivae and EOM are normal.  Neck: Normal range of motion. Neck supple.  Cardiovascular: Normal rate, regular rhythm and normal heart sounds.  Respiratory: Breath sounds normal. No stridor. No respiratory distress.  GI: Soft.  There is mild tenderness LLQ. No peritonitis  Musculoskeletal: Normal range of motion.        General: No tenderness or edema.  Neurological: She is alert and oriented to person, place, and time. Coordination normal.  Skin: Skin is warm and dry. No rash noted.  Psychiatric: She has a normal mood and affect. Her behavior is normal. Thought content normal.     Assessment/Plan: The patient appears to have some slight worsening of her diverticulitis with microperforation.  Her abdomen is quite soft so I do not think she requires urgent surgical intervention.  I would agree with readmission to the hospital and start her on broad-spectrum antibiotic therapy and bowel rest.  Since she has had almost 3 weeks of penicillin type therapy I would favor switching her to something like Cipro and Flagyl.  We will follow her closely with you.  If she does not improve over the next few days then she may require surgery at that point.  Autumn Messing III 02/14/2019, 5:11 PM

## 2019-02-14 NOTE — ED Provider Notes (Signed)
Care assumed from [facility], PA-C at shift change with CT abd/pelvis pending.   In brief, this patient is a 73 y.o. F past medical history of diverticulitis with perforation recently discharged from the hospital 2 days ago who presents for evaluation of worsening left lower quadrant abdominal pain and nonbloody, nonbilious emesis.  Please see note from previous provider for full history/physical exam.   Physical Exam  BP 121/68 (BP Location: Right Arm)   Pulse 80   Temp 98.6 F (37 C) (Oral)   Resp 16   Ht 5' 2.5" (1.588 m)   Wt 78.9 kg   SpO2 95%   BMI 31.32 kg/m   Physical Exam   Abdomen is soft, nondistended.  Mild tenderness in the left lower quadrant.  ED Course/Procedures     Procedures  MDM    PLAN: Patient with increasing leukocytosis as compared to discharge.  She is pending CT abdomen pelvis.  Patient discussed with general surgery (Dr. Dema Severin) who knows patient is in the ED.  MDM: Inflammatory changes surrounding the proximal sigmoid colon that is slightly worsened compared to previous exam.  There are multiple moderate distended small bowel loops in the upper and mid abdomen with transition in the lower mid abdomen at the site of mesentery inflammation of beer by sigmoid colon suggesting developing small bowel obstruction.  There is mention of abscess that was seen previously which is not changed in size but there is current air formation noticed within the abscess not previously seen.  We will plan to consult surgery.  Discussed patient with Dr. Marlou Starks (Gen Surg).  Plan for surgery consult with medical admission. Recommends changing antibiotic to Zosyn IV.  Recommends patient be n.p.o.  If patient keeps vomiting, recommends placement of NG tube.  If no vomiting, does not need to go NG tube.  Discussed with hospitalist.  Will plan to admit.  1. LLQ pain   2. Intra-abdominal abscess [facility])           Volanda Napoleon, PA-C 02/14/19 2045    Deno Etienne,  DO 02/14/19 2122

## 2019-02-14 NOTE — ED Notes (Signed)
ED TO INPATIENT HANDOFF REPORT  ED Nurse Name and Phone #: Caprice Kluver [phone]  S Name/Age/Gender Denise Payne 73 y.o. female Room/Bed: 025C/025C  Code Status   Code Status: Prior  Home/SNF/Other Home Patient oriented to: self, place, time and situation Is this baseline? Yes   Triage Complete: Triage complete  Chief Complaint sent by dr/abd pain/n/v  Triage Note Pt reports abd pain, n/v since Wednesday. Was recently admitted for diverticulitis last week.    Allergies Allergies  Allergen Reactions  . Latex Shortness Of Breath and Swelling    Lip and eye swelling  . Onion Diarrhea and Other (See Comments)    Stomach cramps    Level of Care/Admitting Diagnosis ED Disposition    ED Disposition Condition Comment   Admit  Hospital Area: [place] [[id number]]  Level of Care: Med-Surg [16]  Covid Evaluation: N/A  Diagnosis: Colonic diverticular abscess [[id number]]  Admitting Physician: Morrison Old  Attending Physician: Morrison Old  Estimated length of stay: 3 - 4 days  Certification:: I certify this patient will need inpatient services for at least 2 midnights  Bed request comments: med surg  PT Class (Do Not Modify): Inpatient [101]  PT Acc Code (Do Not Modify): Private [1]       B Medical/Surgery History Past Medical History:  Diagnosis Date  . Asthma   . Sjogren's syndrome ([place]) 01/26/2019   Past Surgical History:  Procedure Laterality Date  . AUGMENTATION MAMMAPLASTY Bilateral 10/16/1976     A IV Location/Drains/Wounds Patient Lines/Drains/Airways Status   Active Line/Drains/Airways    Name:   Placement date:   Placement time:   Site:   Days:   Peripheral IV 02/14/19 Right Forearm   02/14/19    1345    Forearm   less than 1          Intake/Output Last 24 hours No intake or output data in the 24 hours ending 02/14/19 1737  Labs/Imaging Results for orders placed or performed during the hospital encounter  of 02/14/19 (from the past 48 hour(s))  Lipase, blood     Status: None   Collection Time: 02/14/19 12:36 PM  Result Value Ref Range   Lipase 31 11 - 51 U/L    Comment: Performed at [facility], [place] [address], [place], [place] [address]  Comprehensive metabolic panel     Status: Abnormal   Collection Time: 02/14/19 12:36 PM  Result Value Ref Range   Sodium 137 135 - 145 mmol/L   Potassium 4.7 3.5 - 5.1 mmol/L   Chloride 104 98 - 111 mmol/L   CO2 21 (L) 22 - 32 mmol/L   Glucose, Bld 114 (H) 70 - 99 mg/dL   BUN 13 8 - 23 mg/dL   Creatinine, Ser 0.86 0.44 - 1.00 mg/dL   Calcium 9.3 8.9 - 10.3 mg/dL   Total Protein 7.0 6.5 - 8.1 g/dL   Albumin 3.1 (L) 3.5 - 5.0 g/dL   AST 45 (H) 15 - 41 U/L   ALT 24 0 - 44 U/L   Alkaline Phosphatase 126 38 - 126 U/L   Total Bilirubin 1.5 (H) 0.3 - 1.2 mg/dL   GFR calc non Af Amer >60 >60 mL/min   GFR calc Af Amer >60 >60 mL/min   Anion gap 12 5 - 15    Comment: Performed at [facility] [address] [place], [place] [address]  CBC     Status: Abnormal   Collection  Time: 02/14/19 12:36 PM  Result Value Ref Range   WBC 22.4 (H) 4.0 - 10.5 K/uL   RBC 4.29 3.87 - 5.11 MIL/uL   Hemoglobin 11.8 (L) 12.0 - 15.0 g/dL   HCT 36.8 36.0 - 46.0 %   MCV 85.8 80.0 - 100.0 fL   MCH 27.5 26.0 - 34.0 pg   MCHC 32.1 30.0 - 36.0 g/dL   RDW 13.2 11.5 - 15.5 %   Platelets 576 (H) 150 - 400 K/uL   nRBC 0.0 0.0 - 0.2 %    Comment: Performed at [facility], [place] [address], [place], [place] [address]  Urinalysis, Routine w reflex microscopic     Status: Abnormal   Collection Time: 02/14/19  1:03 PM  Result Value Ref Range   Color, Urine AMBER (A) YELLOW    Comment: BIOCHEMICALS MAY BE AFFECTED BY COLOR   APPearance HAZY (A) CLEAR   Specific Gravity, Urine 1.026 1.005 - 1.030   pH 5.0 5.0 - 8.0   Glucose, UA NEGATIVE NEGATIVE mg/dL   Hgb urine dipstick NEGATIVE NEGATIVE   Bilirubin Urine NEGATIVE NEGATIVE   Ketones, ur NEGATIVE NEGATIVE  mg/dL   Protein, ur 30 (A) NEGATIVE mg/dL   Nitrite NEGATIVE NEGATIVE   Leukocytes,Ua MODERATE (A) NEGATIVE   RBC / HPF 0-5 0 - 5 RBC/hpf   WBC, UA >50 (H) 0 - 5 WBC/hpf   Bacteria, UA RARE (A) NONE SEEN   Squamous Epithelial / LPF 0-5 0 - 5   WBC Clumps PRESENT    Mucus PRESENT    Budding Yeast PRESENT    Hyaline Casts, UA PRESENT     Comment: Performed at [facility], [place] [address], [place], [place] [address]   Ct Abdomen Pelvis W Contrast  Result Date: 02/14/2019 CLINICAL DATA:  Patient was recently admitted for diverticulitis with continue abdomen pain. EXAM: CT ABDOMEN AND PELVIS WITH CONTRAST TECHNIQUE: Multidetector CT imaging of the abdomen and pelvis was performed using the standard protocol following bolus administration of intravenous contrast. CONTRAST:  14mL OMNIPAQUE IOHEXOL 300 MG/ML  SOLN COMPARISON:  February 07, 2019. FINDINGS: Lower chest: Minimal atelectasis of the lung bases are noted. The heart size is normal. Hepatobiliary: No focal liver abnormality is seen. Gallstones are identified in the gallbladder. No inflammatory changes noted around the gallbladder. No biliary dilatation. Pancreas: Unremarkable. No pancreatic ductal dilatation or surrounding inflammatory changes. Spleen: Normal in size without focal abnormality. Adrenals/Urinary Tract: Adrenal glands are unremarkable. Kidneys are normal, without renal calculi, focal lesion, or hydronephrosis. The previously noted enhancing fluid collection at the dome of the bladder is stable in size but the collection currently has air suggesting focal abscess. Stomach/Bowel: Inflammatory changes surrounding the proximal sigmoid colon is slightly worsened. There are multiple moderate distended small bowel loops in the upper and mid abdomen with transition in the lower mid abdomen at the site of mesentery inflammation of nearby sigmoid colon suggesting possible developing small bowel obstruction. The stomach is normal.  The  appendix is normal. Vascular/Lymphatic: Aortic atherosclerosis. No enlarged abdominal or pelvic lymph nodes. Reproductive: Uterus and bilateral adnexa are unremarkable. Other: The previously seen small abscesses in lower abdomen are probably not significantly changed in size but there is current air formation within the abscess not previously seen. Musculoskeletal: Degenerative joint changes of the spine are identified. IMPRESSION: Inflammatory changes surrounding the proximal sigmoid colon is slightly worsened compared to prior exam with worsened interval question developing small bowel obstruction as described. The  previously noted small abscesses in the lower abdomen probably not significantly changed in size but there is current air formation noted within the abscesses not previously seen. Electronically Signed   By: Abelardo Diesel M.D.   On: 02/14/2019 15:57    Pending Labs Unresulted Labs (From admission, onward)    Start     Ordered   02/14/19 1716  Blood culture (routine x 2)  BLOOD CULTURE X 2,   STAT     02/14/19 1715   02/14/19 1521  Urine culture  Add-on,   STAT     02/14/19 1520          Vitals/Pain Today's Vitals   02/14/19 1515 02/14/19 1631 02/14/19 1634 02/14/19 1715  BP: 134/62 (!) 144/59 (!) 144/59 131/71  Pulse: 83 94 94 93  Resp:   16   Temp:      TempSrc:      SpO2: 99% 100% 100% 100%    Isolation Precautions No active isolations  Medications Medications  morphine 4 MG/ML injection 4 mg (4 mg Intravenous Refused 02/14/19 1350)  ciprofloxacin (CIPRO) IVPB 400 mg (has no administration in time range)  metroNIDAZOLE (FLAGYL) IVPB 500 mg (has no administration in time range)  sodium chloride flush (NS) 0.9 % injection 3 mL (3 mLs Intravenous Given 02/14/19 1349)  sodium chloride 0.9 % bolus 1,000 mL (1,000 mLs Intravenous New Bag/Given 02/14/19 1349)  ondansetron (ZOFRAN) injection 4 mg (4 mg Intravenous Given 02/14/19 1349)  iohexol (OMNIPAQUE) 300 MG/ML solution 100 mL  (100 mLs Intravenous Contrast Given 02/14/19 1528)    Mobility walks Low fall risk   Focused Assessments GI-Nausea/Vomiting   R Recommendations: See Admitting Provider Note  Report given to:   Additional Notes:

## 2019-02-14 NOTE — ED Notes (Signed)
Called to give report x1. 

## 2019-02-14 NOTE — ED Triage Notes (Signed)
Pt reports abd pain, n/v since Wednesday. Was recently admitted for diverticulitis last week.

## 2019-02-14 NOTE — ED Provider Notes (Signed)
[facility] EMERGENCY DEPARTMENT Provider Note   CSN: [id number] Arrival date & time: 02/14/19  1222  History   Chief Complaint Chief Complaint  Patient presents with  . Abdominal Pain  . Emesis   HPI Denise Payne is a 73 y.o. female with past medical history significant for diverticulitis with perforation, colovesicular fistula, asthma who presents for evaluation abdominal pain. Patient with nausea, vomiting as well as left-sided abdominal pain x2 days.  Patient recently admitted on 01/25/2019- 02/08/19 for diverticulitis with perforation and fistula formation.  Patient states her pain resolved up until 2 days after discharge and returned on the 27th.  States she did call [facility] [place] [facility] and they told her to proceed to the emergency department for evaluation.  She is had multiple episodes of nonbloody, nonbilious emesis.  Has been unable to tolerate p.o. intake except for small sips of liquids at home.  Denies fever, chills, headache, neck pain, neck stiffness, chest pain, shortness of breath, dysuria, diarrhea, constipation.  Last bowel movement this morning without melena or hematochezia.  She denies diarrhea.  Her current pain a 4/10.  Denies radiation of pain.  Describes as a cramping sensation.  Has not taken anything for pain PTA.  History obtained from patient.  No interpreter was used.    Last PO intake- 7am this morning Last Colonoscopy 2019 without diverticulosis PCP-Dr. Jossie Ng [facility]  HPI  Past Medical History:  Diagnosis Date  . Asthma   . Sjogren's syndrome ([place]) 01/26/2019    Patient Active Problem List   Diagnosis Date Noted  . Bowel perforation ([place])   . Asthma 01/26/2019  . Sjogren's syndrome ([place]) 01/26/2019  . Diverticulitis of large intestine with perforation 01/25/2019  . 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 AUGMENTATION MAMMAPLASTY Bilateral 10/16/1976     OB History    No obstetric history on file.      Home Medications    Prior to Admission medications   Medication Sig Start Date End Date Taking? Authorizing Provider  albuterol (PROVENTIL HFA;VENTOLIN HFA) 108 (90 Base) MCG/ACT inhaler Inhale 2 puffs into the lungs every 6 (six) hours as needed for wheezing or shortness of breath.     [provider]  amoxicillin-clavulanate (AUGMENTIN) 875-125 MG tablet Take 1 tablet by mouth 2 (two) times daily with a meal. 02/08/19   Danford, Suann Larry, MD  aspirin EC 81 MG tablet Take 81 mg by mouth daily.     [provider]  calcium carbonate (CALCIUM 600) 600 MG TABS tablet Take 600 mg by mouth 2 (two) times daily with a meal.    [provider]  Cholecalciferol (VITAMIN D) 50 MCG (2000 UT) tablet Take 2,000 Units by mouth daily.    [provider]  cyclobenzaprine (FLEXERIL) 10 MG tablet Take by mouth.    [provider]  diclofenac sodium (VOLTAREN) 1 % GEL Apply 2 g topically 2 (two) times daily as needed (pain).     [provider]  esomeprazole (NEXIUM) 40 MG capsule Take 40 mg by mouth daily.     [provider]  hydroxychloroquine (PLAQUENIL) 200 MG tablet Take 200 mg by mouth 2 (two) times daily.     [provider]  Melatonin 10 MG TABS Take 10 mg by mouth at bedtime as needed (sleep).     [provider]  montelukast (SINGULAIR) 10 MG tablet Take 10 mg by mouth at bedtime.  [provider]  naproxen sodium (ALEVE) 220 MG tablet Take 220 mg by mouth as needed (pain).    [provider]  oxyCODONE (OXY IR/ROXICODONE) 5 MG immediate release tablet Take 1 tablet (5 mg total) by mouth every 6 (six) hours as needed (5mg  for moderate pain, 10mg  for severe pain). 02/08/19   Danford, Suann Larry, MD  vitamin B-12 (CYANOCOBALAMIN) 1000 MCG tablet Take 1,000 mcg by mouth daily.     [provider]    Family History Family History  Problem Relation  Age of Onset  . Breast cancer Mother 2    Social History Social History   Tobacco Use  . Smoking status: Never Smoker  . Smokeless tobacco: Never Used  Substance Use Topics  . Alcohol use: Yes    Alcohol/week: 1.0 standard drinks    Types: 1 Glasses of wine per week  . Drug use: Never     Allergies   Latex   Review of Systems Review of Systems  Constitutional: Negative.   HENT: Negative.   Respiratory: Negative.   Cardiovascular: Negative.   Gastrointestinal: Positive for abdominal pain, nausea and vomiting. Negative for abdominal distention, anal bleeding, blood in stool, constipation, diarrhea and rectal pain.  Genitourinary: Negative.   Skin: Negative.   Neurological: Negative.   All other systems reviewed and are negative.    Physical Exam Updated Vital Signs BP 125/68   Pulse 88   Temp 98.2 F (36.8 C) (Oral)   Resp 14   SpO2 98%   Physical Exam Vitals signs and nursing note reviewed.  Constitutional:      General: She is not in acute distress.    Appearance: She is well-developed. She is not ill-appearing, toxic-appearing or diaphoretic.  HENT:     Head: Normocephalic and atraumatic.     Mouth/Throat:     Comments: Mucous membranes dry Eyes:     Pupils: Pupils are equal, round, and reactive to light.  Neck:     Musculoskeletal: Normal range of motion.  Cardiovascular:     Rate and Rhythm: Normal rate.     Comments: No murmurs, rubs or gallops. Pulmonary:     Effort: No respiratory distress.     Comments: Clear to auscultation bilaterally without wheeze, rhonchi rales.  No accessory muscle usage.  Speaks in full sentences without difficulty. Abdominal:     General: There is no distension.     Comments: Soft, without rebound or guarding.  Generalized tenderness to the left lower quadrant and suprapubic region.  Negative CVA tenderness.  Musculoskeletal: Normal range of motion.  Skin:    General: Skin is warm and dry.     Comments: Brisk  capillary refill.  No pallor or jaundice.  Neurological:     Mental Status: She is alert.     Comments: No facial droop.  Cranial 2 through 12 grossly intact.    ED Treatments / Results  Labs (all labs ordered are listed, but only abnormal results are displayed) Labs Reviewed  COMPREHENSIVE METABOLIC PANEL - Abnormal; Notable for the following components:      Result Value   CO2 21 (*)    Glucose, Bld 114 (*)    Albumin 3.1 (*)    AST 45 (*)    Total Bilirubin 1.5 (*)    All other components within normal limits  CBC - Abnormal; Notable for the following components:   WBC 22.4 (*)    Hemoglobin 11.8 (*)    Platelets 576 (*)  All other components within normal limits  URINALYSIS, ROUTINE W REFLEX MICROSCOPIC - Abnormal; Notable for the following components:   Color, Urine AMBER (*)    APPearance HAZY (*)    Protein, ur 30 (*)    Leukocytes,Ua MODERATE (*)    WBC, UA >50 (*)    Bacteria, UA RARE (*)    All other components within normal limits  LIPASE, BLOOD    EKG None  Radiology No results found.  Procedures Procedures (including critical care time)  Medications Ordered in ED Medications  morphine 4 MG/ML injection 4 mg (4 mg Intravenous Refused 02/14/19 1350)  sodium chloride flush (NS) 0.9 % injection 3 mL (3 mLs Intravenous Given 02/14/19 1349)  sodium chloride 0.9 % bolus 1,000 mL (1,000 mLs Intravenous New Bag/Given 02/14/19 1349)  ondansetron (ZOFRAN) injection 4 mg (4 mg Intravenous Given 02/14/19 1349)    Initial Impression / Assessment and Plan / ED Course  I have reviewed the triage vital signs and the nursing notes.  Pertinent labs & imaging results that were available during my care of the patient were reviewed by me and considered in my medical decision making (see chart for details).  66 old female who appears otherwise well presents for evaluation LLQ abdominal pain.  Afebrile, nonseptic, non-ill-appearing.  Patient recently hospitalized for bowel  perforation with diverticulitis and discharged 4 days ago.  Increase left lower quadrant abdominal pain as well as multiple episodes of nonbloody, nonbilious emesis.  Normal bowel movements, last bowel movement this morning without melena or hematochezia.  Has not taking anything for pain.  Her abdomen is soft without rebound or guarding.  She does have tenderness to her left lower quadrant.  Normal urination.  Mucous membranes appear mildly dry. Will give IV fluids, urinalysis, labs, imaging.  CBC with leukocytosis at 22.4, previously 12.0, 6 days ago.  Hemoglobin 11.8, improved from previous at 9.6 Metabolic panel with mild hyperglycemia at 114, bili at 1.5, AST at 45--no epigastric or right upper quadrant tenderness.  Negative Murphy sign.  Low suspicion for gallbladder pathology. Urinalysis moderate leukocytes, rare bacteria, >50 WBC, Culture sent Lipase 31  1420: On reevaluation abdomen soft, mildly tender left lower quadrant without rebound or guarding.  Patient refused prior pain medication.  Patient pending imaging at this time.  Will reevaluate.  1515: On reevaluation abdomen soft, nontender without rebound or guarding.  Patient has no anything for pain at this time. Voiced relief with Zofran given in ED. Patient pending CT scan. May need to consult with general surgery pending on CT results.  Patient care transferred to [facility], PA-C at shift change.  CT scan pending.       Final Clinical Impressions(s) / ED Diagnoses   Final diagnoses:  LLQ pain    ED Discharge Orders    None       Ulises Wolfinger A, PA-C 02/14/19 1520    Hayden Rasmussen, MD 02/14/19 1754

## 2019-02-15 DIAGNOSIS — J4520 Mild intermittent asthma, uncomplicated: Secondary | ICD-10-CM

## 2019-02-15 DIAGNOSIS — I10 Essential (primary) hypertension: Secondary | ICD-10-CM

## 2019-02-15 DIAGNOSIS — M3500 Sicca syndrome, unspecified: Secondary | ICD-10-CM

## 2019-02-15 LAB — BASIC METABOLIC PANEL
Anion gap: 8 (ref 5–15)
BUN: 12 mg/dL (ref 8–23)
CO2: 22 mmol/L (ref 22–32)
Calcium: 8.5 mg/dL — ABNORMAL LOW (ref 8.9–10.3)
Chloride: 106 mmol/L (ref 98–111)
Creatinine, Ser: 0.7 mg/dL (ref 0.44–1.00)
GFR calc Af Amer: >60 mL/min (ref >60)
GFR calc non Af Amer: >60 mL/min (ref >60)
Glucose, Bld: 88 mg/dL (ref 70–99)
Potassium: 4 mmol/L (ref 3.5–5.1)
Sodium: 136 mmol/L (ref 135–145)

## 2019-02-15 LAB — CBC
HCT: 28.8 % — ABNORMAL LOW (ref 36.0–46.0)
Hemoglobin: 9.3 g/dL — ABNORMAL LOW (ref 12.0–15.0)
MCH: 27.5 pg (ref 26.0–34.0)
MCHC: 32.3 g/dL (ref 30.0–36.0)
MCV: 85.2 fL (ref 80.0–100.0)
Platelets: 406 10*3/uL — ABNORMAL HIGH (ref 150–400)
RBC: 3.38 MIL/uL — ABNORMAL LOW (ref 3.87–5.11)
RDW: 13.2 % (ref 11.5–15.5)
WBC: 16 10*3/uL — ABNORMAL HIGH (ref 4.0–10.5)
nRBC: 0 % (ref 0.0–0.2)

## 2019-02-15 LAB — URINE CULTURE: Culture: 60000 — AB

## 2019-02-15 NOTE — Plan of Care (Signed)
  Problem: Pain Managment: Goal: General experience of comfort will improve Outcome: Progressing   Problem: Coping: Goal: Level of anxiety will decrease Outcome: Progressing   Problem: Nutrition: Goal: Adequate nutrition will be maintained Outcome: Progressing   

## 2019-02-15 NOTE — Progress Notes (Signed)
Patient ID: Denise Payne, female   DOB: 08/23/1946, 73 y.o.   MRN: [id number] [facility] Progress Note:   * No surgery found *  Subjective: Mental status is clear.  Up reading iPad Objective: Vital signs in last 24 hours: Temp:  [98 F (36.7 C)-98.6 F (37 C)] 98.4 F (36.9 C) (05/02 0434) Pulse Rate:  [79-104] 79 (05/02 0434) Resp:  [14-18] 18 (05/02 0434) BP: (121-163)/(48-71) 125/48 (05/02 0434) SpO2:  [95 %-100 %] 97 % (05/02 0434) Weight:  [78.9 kg] 78.9 kg (05/01 1748)  Intake/Output from previous day: 05/01 0701 - 05/02 0700 In: 807.6 [I.V.:610; IV Piggyback:197.6] Out: 200 [Emesis/NG output:200] Intake/Output this shift: Total I/O In: 360 [P.O.:360] Out: -   Physical Exam: Work of breathing is normal.  On Cipro/Flagyl for recurrent diverticultis/vesicle fistula  Lab Results:  Results for orders placed or performed during the hospital encounter of 02/14/19 (from the past 48 hour(s))  Lipase, blood     Status: None   Collection Time: 02/14/19 12:36 PM  Result Value Ref Range   Lipase 31 11 - 51 U/L    Comment: Performed at [facility], [address] [place], [place] [address]  Comprehensive metabolic panel     Status: Abnormal   Collection Time: 02/14/19 12:36 PM  Result Value Ref Range   Sodium 137 135 - 145 mmol/L   Potassium 4.7 3.5 - 5.1 mmol/L   Chloride 104 98 - 111 mmol/L   CO2 21 (L) 22 - 32 mmol/L   Glucose, Bld 114 (H) 70 - 99 mg/dL   BUN 13 8 - 23 mg/dL   Creatinine, Ser 0.86 0.44 - 1.00 mg/dL   Calcium 9.3 8.9 - 10.3 mg/dL   Total Protein 7.0 6.5 - 8.1 g/dL   Albumin 3.1 (L) 3.5 - 5.0 g/dL   AST 45 (H) 15 - 41 U/L   ALT 24 0 - 44 U/L   Alkaline Phosphatase 126 38 - 126 U/L   Total Bilirubin 1.5 (H) 0.3 - 1.2 mg/dL   GFR calc non Af Amer >60 >60 mL/min   GFR calc Af Amer >60 >60 mL/min   Anion gap 12 5 - 15    Comment: Performed at [facility] [address] [place], [place] [address]  CBC     Status: Abnormal   Collection Time: 02/14/19 12:36 PM  Result Value Ref Range   WBC 22.4 (H) 4.0 - 10.5 K/uL   RBC 4.29 3.87 - 5.11 MIL/uL   Hemoglobin 11.8 (L) 12.0 - 15.0 g/dL   HCT 36.8 36.0 - 46.0 %   MCV 85.8 80.0 - 100.0 fL   MCH 27.5 26.0 - 34.0 pg   MCHC 32.1 30.0 - 36.0 g/dL   RDW 13.2 11.5 - 15.5 %   Platelets 576 (H) 150 - 400 K/uL   nRBC 0.0 0.0 - 0.2 %    Comment: Performed at [facility], [place] [address] [place], [place] [address]  Urinalysis, Routine w reflex microscopic     Status: Abnormal   Collection Time: 02/14/19  1:03 PM  Result Value Ref Range   Color, Urine AMBER (A) YELLOW    Comment: BIOCHEMICALS MAY BE AFFECTED BY COLOR   APPearance HAZY (A) CLEAR   Specific Gravity, Urine 1.026 1.005 - 1.030   pH 5.0 5.0 - 8.0   Glucose, UA NEGATIVE NEGATIVE mg/dL   Hgb urine dipstick NEGATIVE NEGATIVE   Bilirubin Urine NEGATIVE NEGATIVE  Ketones, ur NEGATIVE NEGATIVE mg/dL   Protein, ur 30 (A) NEGATIVE mg/dL   Nitrite NEGATIVE NEGATIVE   Leukocytes,Ua MODERATE (A) NEGATIVE   RBC / HPF 0-5 0 - 5 RBC/hpf   WBC, UA >50 (H) 0 - 5 WBC/hpf   Bacteria, UA RARE (A) NONE SEEN   Squamous Epithelial / LPF 0-5 0 - 5   WBC Clumps PRESENT    Mucus PRESENT    Budding Yeast PRESENT    Hyaline Casts, UA PRESENT     Comment: Performed at [facility] [address], [place], [place] [address]  Blood culture (routine x 2)     Status: None (Preliminary result)   Collection Time: 02/14/19  5:16 PM  Result Value Ref Range   Specimen Description BLOOD SITE NOT SPECIFIED    Special Requests      BOTTLES DRAWN AEROBIC AND ANAEROBIC Blood Culture results may not be optimal due to an inadequate volume of blood received in culture bottles   Culture      NO GROWTH < 24 HOURS Performed at [facility] [address] [place], [place] [address]    Report Status PENDING   Blood culture (routine x 2)     Status: None (Preliminary result)   Collection Time: 02/14/19  5:35 PM  Result  Value Ref Range   Specimen Description BLOOD RIGHT HAND    Special Requests      BOTTLES DRAWN AEROBIC AND ANAEROBIC Blood Culture results may not be optimal due to an inadequate volume of blood received in culture bottles   Culture      NO GROWTH < 24 HOURS Performed at [facility] [address], [place], [place] [address]    Report Status PENDING   Basic metabolic panel     Status: Abnormal   Collection Time: 02/15/19  2:22 AM  Result Value Ref Range   Sodium 136 135 - 145 mmol/L   Potassium 4.0 3.5 - 5.1 mmol/L   Chloride 106 98 - 111 mmol/L   CO2 22 22 - 32 mmol/L   Glucose, Bld 88 70 - 99 mg/dL   BUN 12 8 - 23 mg/dL   Creatinine, Ser 0.70 0.44 - 1.00 mg/dL   Calcium 8.5 (L) 8.9 - 10.3 mg/dL   GFR calc non Af Amer >60 >60 mL/min   GFR calc Af Amer >60 >60 mL/min   Anion gap 8 5 - 15    Comment: Performed at [facility] [address], [place], [place] [address]  CBC     Status: Abnormal   Collection Time: 02/15/19  2:22 AM  Result Value Ref Range   WBC 16.0 (H) 4.0 - 10.5 K/uL   RBC 3.38 (L) 3.87 - 5.11 MIL/uL   Hemoglobin 9.3 (L) 12.0 - 15.0 g/dL   HCT 28.8 (L) 36.0 - 46.0 %   MCV 85.2 80.0 - 100.0 fL   MCH 27.5 26.0 - 34.0 pg   MCHC 32.3 30.0 - 36.0 g/dL   RDW 13.2 11.5 - 15.5 %   Platelets 406 (H) 150 - 400 K/uL   nRBC 0.0 0.0 - 0.2 %    Comment: Performed at [facility], [place] [address] [place], [place] [address]    Radiology/Results: Ct Abdomen Pelvis W Contrast  Result Date: 02/14/2019 CLINICAL DATA:  Patient was recently admitted for diverticulitis with continue abdomen pain. EXAM: CT ABDOMEN AND PELVIS WITH CONTRAST TECHNIQUE: Multidetector CT imaging of  the abdomen and pelvis was performed using the standard protocol following bolus administration of intravenous contrast. CONTRAST:  165mL OMNIPAQUE IOHEXOL 300 MG/ML  SOLN COMPARISON:  February 07, 2019. FINDINGS: Lower chest: Minimal atelectasis of the lung bases are noted. The heart size is  normal. Hepatobiliary: No focal liver abnormality is seen. Gallstones are identified in the gallbladder. No inflammatory changes noted around the gallbladder. No biliary dilatation. Pancreas: Unremarkable. No pancreatic ductal dilatation or surrounding inflammatory changes. Spleen: Normal in size without focal abnormality. Adrenals/Urinary Tract: Adrenal glands are unremarkable. Kidneys are normal, without renal calculi, focal lesion, or hydronephrosis. The previously noted enhancing fluid collection at the dome of the bladder is stable in size but the collection currently has air suggesting focal abscess. Stomach/Bowel: Inflammatory changes surrounding the proximal sigmoid colon is slightly worsened. There are multiple moderate distended small bowel loops in the upper and mid abdomen with transition in the lower mid abdomen at the site of mesentery inflammation of nearby sigmoid colon suggesting possible developing small bowel obstruction. The stomach is normal.  The appendix is normal. Vascular/Lymphatic: Aortic atherosclerosis. No enlarged abdominal or pelvic lymph nodes. Reproductive: Uterus and bilateral adnexa are unremarkable. Other: The previously seen small abscesses in lower abdomen are probably not significantly changed in size but there is current air formation within the abscess not previously seen. Musculoskeletal: Degenerative joint changes of the spine are identified. IMPRESSION: Inflammatory changes surrounding the proximal sigmoid colon is slightly worsened compared to prior exam with worsened interval question developing small bowel obstruction as described. The previously noted small abscesses in the lower abdomen probably not significantly changed in size but there is current air formation noted within the abscesses not previously seen. Electronically Signed   By: Abelardo Diesel M.D.   On: 02/14/2019 15:57    Anti-infectives: Anti-infectives (From admission, onward)   Start     Dose/Rate  Route Frequency Ordered Stop   02/14/19 1800  ciprofloxacin (CIPRO) IVPB 400 mg     400 mg 200 mL/hr over 60 Minutes Intravenous Every 12 hours 02/14/19 1711     02/14/19 1800  metroNIDAZOLE (FLAGYL) IVPB 500 mg     500 mg 100 mL/hr over 60 Minutes Intravenous Every 8 hours 02/14/19 1711     02/14/19 1700  piperacillin-tazobactam (ZOSYN) IVPB 3.375 g  Status:  Discontinued     3.375 g 100 mL/hr over 30 Minutes Intravenous  Once 02/14/19 1652 02/14/19 1711      Assessment/Plan: Problem List: Patient Active Problem List   Diagnosis Date Noted  . 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 Bowel perforation ([place])   . Asthma 01/26/2019  . Sjogren's syndrome ([place]) 01/26/2019  . Diverticulitis of large intestine with perforation 01/25/2019  . 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 Dema Severin as she has appt to see him and Dr. Phebe Colla in the office.   * No surgery found *    LOS: 1 day   Matt B. Hassell Done, MD, [facility] [phone] beeper [phone]  02/15/2019 9:31 AM

## 2019-02-15 NOTE — Progress Notes (Addendum)
PROGRESS NOTE    Denise Payne  JAS:[id number] DOB: 11-06-1945 DOA: 02/14/2019 PCP: Leeroy Cha, MD   Brief Narrative:  Per HPI: Denise Payne  is a 73 y.o. female  retired Therapist, sports with a history of persistent asthma and Sjogren's syndrome on plaquenil presents to the ED with abdominal pain and persistent emesis without blood but with occasional bile.... No fevers no chills Patient was admitted on 01/25/2019 with acute sigmoid diverticulitis with microperforation and colovesical fistula----treated with IV Zosyn and discharged home on Augmentin on 02/08/2019----- No fevers......., No tachypnea No dysuria or hematuria  In ED.....WBC is up to 22.4 from 12 .6 about  6 days ago... repeat CT abdomen and pelvis shows worsening findings including air formation noted within the abscess --- Surgical consult from Dr. Autumn Messing appreciated, recommends IV Cipro and Flagyl    Assessment & Plan:   Principal Problem:   Acute sigmoid diverticulitis with microperforation and colovesical fistula/Colonic diverticular abscess Active Problems:   Diverticulitis of large intestine with perforation   Asthma   Sjogren's syndrome ([place])   Sigmoid diverticulitis with microperforation colovesical fistula.  As noted by admitting physician patient had Worsening clinical symptoms, WBC is up to 22.4 from 12 .6 about  6 days ago... repeat CT abdomen and pelvis shows worsening findings including air formation noted within the abscess , patient was recently admitted from 01/24/2021 02/08/2019 for same, treated with IV Zosyn and discharged home on p.o. Augmentin.  Patient was seen by general surgery with recommendation for Cipro and Flagyl which we will continue.  Continue on clear liquid diet PRN antiemetics and analgesics.  Blood cultures are negative to date x1 and white blood cell has decreased from 22.4-16.  We will continue IV antibiotics, continue monitoring labs follow-up cultures.  Additional recommendations  per general surgery.  Asthma.  Patient is not in exacerbation she stable continue Singulair and as needed nebulizers.  Sjogren's syndrome.  This has been stable, patient's been on Plaquenil for 8 years being held secondary to concerns for immunosuppression.  Anticipate restarting with improved infection control  Normocytic anemia.  Patient mildly iron deficient.  We will continue home medications is no evidence of ongoing blood loss.  Anticipate iron on discharge  Hypertension.  Patient has a history of hypertension although was noted to not be on blood pressure medications prior to admission, IV hydralazine made available on a as needed basis.  DVT prophylaxis: SCD/Compression stockings  Code Status: Full    Code Status Orders  (From admission, onward)         Start     Ordered   02/14/19 1821  Full code  Continuous     02/14/19 1820        Code Status History    Date Active Date Inactive Code Status Order ID Comments User Context   01/25/2019 1936 02/08/2019 1605 Full Code [id number]  Elie Confer, MD ED     Family Communication: Patients condition and plan of care including tests being ordered, iv ABX, and holding Plaquennil have been discussed with the patient  who indicate understanding and agree with the plan  Also updated son Denise Payne today, answered all questions Disposition Plan:   Patient will remain inpatient an additional day for IV antibiotics, culture results, further subspecialty evaluation by general surgery.  These interventions are necessary as patient would likely suffer overwhelming infection likely sepsis and hemodynamic collapse without them. Consults called: None Admission status: Inpatient   Consultants:   general surgery  Procedures:  Ct Abdomen Pelvis W Contrast  Result Date: 02/14/2019 CLINICAL DATA:  Patient was recently admitted for diverticulitis with continue abdomen pain. EXAM: CT ABDOMEN AND PELVIS WITH CONTRAST TECHNIQUE: Multidetector CT  imaging of the abdomen and pelvis was performed using the standard protocol following bolus administration of intravenous contrast. CONTRAST:  152mL OMNIPAQUE IOHEXOL 300 MG/ML  SOLN COMPARISON:  February 07, 2019. FINDINGS: Lower chest: Minimal atelectasis of the lung bases are noted. The heart size is normal. Hepatobiliary: No focal liver abnormality is seen. Gallstones are identified in the gallbladder. No inflammatory changes noted around the gallbladder. No biliary dilatation. Pancreas: Unremarkable. No pancreatic ductal dilatation or surrounding inflammatory changes. Spleen: Normal in size without focal abnormality. Adrenals/Urinary Tract: Adrenal glands are unremarkable. Kidneys are normal, without renal calculi, focal lesion, or hydronephrosis. The previously noted enhancing fluid collection at the dome of the bladder is stable in size but the collection currently has air suggesting focal abscess. Stomach/Bowel: Inflammatory changes surrounding the proximal sigmoid colon is slightly worsened. There are multiple moderate distended small bowel loops in the upper and mid abdomen with transition in the lower mid abdomen at the site of mesentery inflammation of nearby sigmoid colon suggesting possible developing small bowel obstruction. The stomach is normal.  The appendix is normal. Vascular/Lymphatic: Aortic atherosclerosis. No enlarged abdominal or pelvic lymph nodes. Reproductive: Uterus and bilateral adnexa are unremarkable. Other: The previously seen small abscesses in lower abdomen are probably not significantly changed in size but there is current air formation within the abscess not previously seen. Musculoskeletal: Degenerative joint changes of the spine are identified. IMPRESSION: Inflammatory changes surrounding the proximal sigmoid colon is slightly worsened compared to prior exam with worsened interval question developing small bowel obstruction as described. The previously noted small abscesses in the  lower abdomen probably not significantly changed in size but there is current air formation noted within the abscesses not previously seen. Electronically Signed   By: Abelardo Diesel M.D.   On: 02/14/2019 15:57   Ct Abdomen Pelvis W Contrast  Result Date: 02/07/2019 CLINICAL DATA:  Diverticular abscess follow-up. EXAM: CT ABDOMEN AND PELVIS WITH CONTRAST TECHNIQUE: Multidetector CT imaging of the abdomen and pelvis was performed using the standard protocol following bolus administration of intravenous contrast. CONTRAST:  152mL ISOVUE-300 IOPAMIDOL (ISOVUE-300) INJECTION 61% COMPARISON:  CT abdomen pelvis dated January 31, 2019. FINDINGS: Lower chest: Improving now small right and trace left pleural effusions and bilateral lower lobe subsegmental atelectasis. Hepatobiliary: No focal liver abnormality. Unchanged cholelithiasis. No gallbladder wall thickening or biliary dilatation. Pancreas: Unremarkable. No pancreatic ductal dilatation or surrounding inflammatory changes. Spleen: Normal in size without focal abnormality. Adrenals/Urinary Tract: Adrenal glands are unremarkable. Kidneys are normal, without renal calculi, focal lesion, or hydronephrosis. Rim enhancing fluid collection at the dome of the bladder has decreased in size, now measuring 13 x 12 mm, previously 17 x 15 mm. Stomach/Bowel: Inflammatory changes surrounding the proximal sigmoid colon appear mildly improved. Extensive sigmoid diverticulosis again noted. Mildly distended small bowel loops in the upper abdomen are again noted, improved when compared to prior study, with transition point again seen in the lower mid abdomen near the site of mesenteric inflammatory changes. Contrast reaches the colon. The stomach is within normal limits.  Normal appendix. Vascular/Lymphatic: Aortic atherosclerosis. No enlarged abdominal or pelvic lymph nodes. Reactive mesenteric lymph nodes again noted. Reproductive: Status post hysterectomy. No adnexal masses. Other:  The previously seen small abscesses in the lower abdomen have decreased in size. For example, the largest collection  now measures 3.0 x 1.1 cm, previously 3.6 x 1.5 cm. No new fluid collection. Musculoskeletal: No acute or significant osseous findings. IMPRESSION: 1. Improving sigmoid diverticulitis with interval decrease in size of several abscesses in the lower abdomen and along the bladder dome. No new fluid collection. 2. Improved partial small bowel obstruction with transition point in the lower abdomen adjacent to the mesenteric inflammation. 3. Decreased bilateral pleural effusions. 4. Unchanged cholelithiasis. 5.  Aortic atherosclerosis (ICD10-I70.0). Electronically Signed   By: Titus Dubin M.D.   On: 02/07/2019 14:52   Ct Abdomen Pelvis W Contrast  Result Date: 01/31/2019 CLINICAL DATA:  Follow-up diverticulitis EXAM: CT ABDOMEN AND PELVIS WITH CONTRAST TECHNIQUE: Multidetector CT imaging of the abdomen and pelvis was performed using the standard protocol following bolus administration of intravenous contrast. CONTRAST:  134mL OMNIPAQUE IOHEXOL 300 MG/ML  SOLN COMPARISON:  01/25/2019 FINDINGS: Lower chest: Moderate right right and small left pleural effusion with dependent atelectasis. Interlobular septal thickening at the lung bases likely represents a small amount of edema. Hepatobiliary: Cholelithiasis.  Liver is unremarkable. Pancreas: Unremarkable Spleen: Calcified granuloma.  Otherwise unremarkable. Adrenals/Urinary Tract: Kidneys are within normal limits. Adrenal glands are unremarkable. Bladder contains a small amount of gas. There is a 1.7 x 1.5 cm fluid and gas collection at the dome of the bladder of presenting either a large diverticulum or possibly a small abscess. These findings raise the possibility of fistula between sigmoid colon and bladder. Stomach/Bowel: There are inflammatory changes of the sigmoid colon adjacent to the dome of the bladder. This is characterized by wall  thickening and stranding in the adjacent fat. Diverticulosis of the sigmoid and descending colon are noted. There is a gas and fluid collection inseparable from the sigmoid colon and dome of the bladder representing either a large inflamed diverticulum or possibly a small abscess. This may indicate a fistula. Distended small bowel loops with air-fluid levels are present in the upper abdomen. Distal small bowel loops are decompressed. The transition point is in the inflammatory process within the pelvis. Vascular/Lymphatic: Atherosclerotic vascular calcifications. No abnormal retroperitoneal adenopathy. Several prominent but non pathologically enlarged mesenteric nodes are noted and are likely inflammatory in nature. Reproductive: Uterus is absent. Other: There is stranding and inflammatory changes within the anterior pelvic fat adjacent to the area of diverticulitis. Multiple very small gas and fluid filled abscesses are present in the lower abdomen and upper pelvis. The largest measures 3.6 x 1.5 cm. A loop of bowel is seen anterior to this fluid collection. Small amount of free fluid about the liver. Musculoskeletal: No vertebral compression deformity. IMPRESSION: Findings are again consistent with acute sigmoid diverticulitis. There are findings worrisome for a fistula between the sigmoid colon and bladder as described above. There are gas and fluid filled collections in the lower abdomen and upper pelvis compatible with multiple small abscesses. The largest is 3.6 x 1.5 cm. It is posterior to a loop of bowel and safe access for percutaneous drainage is not possible. Partial small bowel obstruction pattern. The transition point is in the pelvis at the site of the inflammatory process. Cholelithiasis. Bilateral pleural effusions. Electronically Signed   By: Marybelle Killings M.D.   On: 01/31/2019 11:51   Ct Abdomen Pelvis W Contrast  Result Date: 01/25/2019 CLINICAL DATA:  Diffuse abdominal pain. History of GERD,  nausea. EXAM: CT ABDOMEN AND PELVIS WITH CONTRAST TECHNIQUE: Multidetector CT imaging of the abdomen and pelvis was performed using the standard protocol following bolus administration of intravenous contrast. CONTRAST:  151mL OMNIPAQUE IOHEXOL 300 MG/ML  SOLN COMPARISON:  None. FINDINGS: Lower chest: Mild bibasilar scarring/atelectasis. Hepatobiliary: Numerous stones within the a otherwise unremarkable gallbladder. No focal liver abnormality. No significant bile duct dilatation seen. Pancreas: Unremarkable. No pancreatic ductal dilatation or surrounding inflammatory changes. Spleen: Normal in size without focal abnormality. Adrenals/Urinary Tract: Adrenal glands appear normal. Kidneys are unremarkable without mass, stone or hydronephrosis. No perinephric fluid. Air within the bladder. Stomach/Bowel: Extensive diverticulosis throughout the colon. Inflammation/fluid stranding is seen about the sigmoid colon indicating acute diverticulitis. The inflamed segment of bowel is contiguous with the anterior-superior margin of the bladder concerning for fistula. No dilated large or small bowel loops. Stomach is unremarkable, decompressed. Appendix is normal. Vascular/Lymphatic: Aortic atherosclerosis. No acute appearing vascular abnormality. Numerous small lymph nodes within the lower mesentery, likely reactive in nature. Reproductive: Presumed hysterectomy. No adnexal mass. Other: Small amount of free intraperitoneal air within the central abdomen and upper abdomen. No abscess collection seen. Musculoskeletal: No acute or suspicious osseous finding. IMPRESSION: 1. Diverticulitis of the sigmoid colon. The inflamed segment of sigmoid colon is contiguous with the anterior-superior margin of the bladder indicating colovesical fistula, with associated air in the bladder. No circumscribed abscess collection identified. 2. Free intraperitoneal air within the abdomen and pelvis indicating associated bowel perforation, likely micro  perforation given the small amount of free air. 3. Cholelithiasis without evidence of acute cholecystitis. These results were called by telephone at the time of interpretation on 01/25/2019 at 6:25 pm to Dr. Jola Schmidt , who verbally acknowledged these results. Aortic Atherosclerosis (ICD10-I70.0). Electronically Signed   By: Franki Cabot M.D.   On: 01/25/2019 18:27     Antimicrobials:   Cipro and Flagyl day #2    Subjective: Patient stable.  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 Discussed with patient the role of holding Plaquenil secondary to his peripheral immunosuppressive effects.  Objective: Vitals:   02/14/19 1748 02/14/19 1819 02/14/19 1931 02/15/19 0434  BP:  (!) 144/68 121/68 (!) 125/48  Pulse:  98 80 79  Resp:  17 16 18   Temp:  98 F (36.7 C) 98.6 F (37 C) 98.4 F (36.9 C)  TempSrc:  Oral Oral Oral  SpO2:  96% 95% 97%  Weight: 78.9 kg     Height: 5' 2.5" (1.588 m)       Intake/Output Summary (Last 24 hours) at 02/15/2019 1229 Last data filed at 02/15/2019 1443 Gross per 24 hour  Intake 1167.64 ml  Output 200 ml  Net 967.64 ml   Filed Weights   02/14/19 1748  Weight: 78.9 kg    Examination:  General exam: Appears calm and comfortable  Respiratory system: Clear to auscultation. Respiratory effort normal. Cardiovascular system: S1 & S2 heard, RRR. No JVD, murmurs, rubs, gallops or clicks. No pedal edema. Gastrointestinal system: Abdomen is nondistended, soft and mildly tender. No organomegaly or masses felt. Normal bowel sounds heard. Central nervous system: Alert and oriented. No focal neurological deficits. Extremities: Warm well perfused, no edema Skin: No rashes, lesions or ulcers Psychiatry: Judgement and insight appear normal. Mood & affect appropriate.     Data Reviewed: I have personally reviewed following labs and imaging studies  CBC: Recent Labs  Lab  02/14/19 1236 02/15/19 0222  WBC 22.4* 16.0*  HGB 11.8* 9.3*  HCT 36.8 28.8*  MCV 85.8 85.2  PLT 576* 154*   Basic Metabolic Panel: Recent Labs  Lab 02/14/19  1236 02/15/19 0222  NA 137 136  K 4.7 4.0  CL 104 106  CO2 21* 22  GLUCOSE 114* 88  BUN 13 12  CREATININE 0.86 0.70  CALCIUM 9.3 8.5*   GFR: Estimated Creatinine Clearance: 61.6 mL/min (by C-G formula based on SCr of 0.7 mg/dL). Liver Function Tests: Recent Labs  Lab 02/14/19 1236  AST 45*  ALT 24  ALKPHOS 126  BILITOT 1.5*  PROT 7.0  ALBUMIN 3.1*   Recent Labs  Lab 02/14/19 1236  LIPASE 31   No results for input(s): AMMONIA in the last 168 hours. Coagulation Profile: No results for input(s): INR, PROTIME in the last 168 hours. Cardiac Enzymes: No results for input(s): CKTOTAL, CKMB, CKMBINDEX, TROPONINI in the last 168 hours. BNP (last 3 results) No results for input(s): PROBNP in the last 8760 hours. HbA1C: No results for input(s): HGBA1C in the last 72 hours. CBG: No results for input(s): GLUCAP in the last 168 hours. Lipid Profile: No results for input(s): CHOL, HDL, LDLCALC, TRIG, CHOLHDL, LDLDIRECT in the last 72 hours. Thyroid Function Tests: No results for input(s): TSH, T4TOTAL, FREET4, T3FREE, THYROIDAB in the last 72 hours. Anemia Panel: No results for input(s): VITAMINB12, FOLATE, FERRITIN, TIBC, IRON, RETICCTPCT in the last 72 hours. Sepsis Labs: No results for input(s): PROCALCITON, LATICACIDVEN in the last 168 hours.  Recent Results (from the past 240 hour(s))  Blood culture (routine x 2)     Status: None (Preliminary result)   Collection Time: 02/14/19  5:16 PM  Result Value Ref Range Status   Specimen Description BLOOD SITE NOT SPECIFIED  Final   Special Requests   Final    BOTTLES DRAWN AEROBIC AND ANAEROBIC Blood Culture results may not be optimal due to an inadequate volume of blood received in culture bottles   Culture   Final    NO GROWTH < 24 HOURS Performed at [facility] [address], [place], [place] [address]    Report Status PENDING  Incomplete  Blood culture (routine x 2)     Status: None (Preliminary result)   Collection Time: 02/14/19  5:35 PM  Result Value Ref Range Status   Specimen Description BLOOD RIGHT HAND  Final   Special Requests   Final    BOTTLES DRAWN AEROBIC AND ANAEROBIC Blood Culture results may not be optimal due to an inadequate volume of blood received in culture bottles   Culture   Final    NO GROWTH < 24 HOURS Performed at [facility], [place] [address], [place], [place] [address]    Report Status PENDING  Incomplete         Radiology Studies: Ct Abdomen Pelvis W Contrast  Result Date: 02/14/2019 CLINICAL DATA:  Patient was recently admitted for diverticulitis with continue abdomen pain. EXAM: CT ABDOMEN AND PELVIS WITH CONTRAST TECHNIQUE: Multidetector CT imaging of the abdomen and pelvis was performed using the standard protocol following bolus administration of intravenous contrast. CONTRAST:  170mL OMNIPAQUE IOHEXOL 300 MG/ML  SOLN COMPARISON:  February 07, 2019. FINDINGS: Lower chest: Minimal atelectasis of the lung bases are noted. The heart size is normal. Hepatobiliary: No focal liver abnormality is seen. Gallstones are identified in the gallbladder. No inflammatory changes noted around the gallbladder. No biliary dilatation. Pancreas: Unremarkable. No pancreatic ductal dilatation or surrounding inflammatory changes. Spleen: Normal in size without focal abnormality. Adrenals/Urinary Tract: Adrenal glands are unremarkable. Kidneys are normal, without renal calculi, focal lesion, or hydronephrosis. The previously noted enhancing  fluid collection at the dome of the bladder is stable in size but the collection currently has air suggesting focal abscess. Stomach/Bowel: Inflammatory changes surrounding the proximal sigmoid colon is slightly worsened. There are multiple moderate distended small bowel loops in the  upper and mid abdomen with transition in the lower mid abdomen at the site of mesentery inflammation of nearby sigmoid colon suggesting possible developing small bowel obstruction. The stomach is normal.  The appendix is normal. Vascular/Lymphatic: Aortic atherosclerosis. No enlarged abdominal or pelvic lymph nodes. Reproductive: Uterus and bilateral adnexa are unremarkable. Other: The previously seen small abscesses in lower abdomen are probably not significantly changed in size but there is current air formation within the abscess not previously seen. Musculoskeletal: Degenerative joint changes of the spine are identified. IMPRESSION: Inflammatory changes surrounding the proximal sigmoid colon is slightly worsened compared to prior exam with worsened interval question developing small bowel obstruction as described. The previously noted small abscesses in the lower abdomen probably not significantly changed in size but there is current air formation noted within the abscesses not previously seen. Electronically Signed   By: Abelardo Diesel M.D.   On: 02/14/2019 15:57        Scheduled Meds:  aspirin EC  81 mg Oral QPM   calcium carbonate  1,250 mg Oral BID WC   heparin  5,000 Units Subcutaneous Q8H   montelukast  10 mg Oral QHS    morphine injection  4 mg Intravenous Once   pantoprazole  40 mg Oral Daily   sodium chloride flush  3 mL Intravenous Q12H   Continuous Infusions:  sodium chloride     sodium chloride 75 mL/hr at 02/14/19 1833   ciprofloxacin 400 mg (02/15/19 0622)   metronidazole 500 mg (02/15/19 1014)     LOS: 1 day    Time spent: 14 min    Nicolette Bang, MD [facility]  If 7PM-7AM, please contact night-coverage  02/15/2019, 12:29 PM

## 2019-02-16 ENCOUNTER — Encounter (HOSPITAL_COMMUNITY): Payer: Self-pay

## 2019-02-16 LAB — CBC WITH DIFFERENTIAL/PLATELET
Abs Immature Granulocytes: 0.06 10*3/uL (ref 0.00–0.07)
Basophils Absolute: 0 10*3/uL (ref 0.0–0.1)
Basophils Relative: 1 %
Eosinophils Absolute: 0.2 10*3/uL (ref 0.0–0.5)
Eosinophils Relative: 3 %
HCT: 27.9 % — ABNORMAL LOW (ref 36.0–46.0)
Hemoglobin: 9 g/dL — ABNORMAL LOW (ref 12.0–15.0)
Immature Granulocytes: 1 %
Lymphocytes Relative: 18 %
Lymphs Abs: 1.5 10*3/uL (ref 0.7–4.0)
MCH: 27.6 pg (ref 26.0–34.0)
MCHC: 32.3 g/dL (ref 30.0–36.0)
MCV: 85.6 fL (ref 80.0–100.0)
Monocytes Absolute: 0.6 10*3/uL (ref 0.1–1.0)
Monocytes Relative: 7 %
Neutro Abs: 5.8 10*3/uL (ref 1.7–7.7)
Neutrophils Relative %: 70 %
Platelets: 365 10*3/uL (ref 150–400)
RBC: 3.26 MIL/uL — ABNORMAL LOW (ref 3.87–5.11)
RDW: 13.2 % (ref 11.5–15.5)
WBC: 8.2 10*3/uL (ref 4.0–10.5)
nRBC: 0 % (ref 0.0–0.2)

## 2019-02-16 LAB — BASIC METABOLIC PANEL
Anion gap: 10 (ref 5–15)
BUN: 6 mg/dL — ABNORMAL LOW (ref 8–23)
CO2: 21 mmol/L — ABNORMAL LOW (ref 22–32)
Calcium: 8.4 mg/dL — ABNORMAL LOW (ref 8.9–10.3)
Chloride: 107 mmol/L (ref 98–111)
Creatinine, Ser: 0.73 mg/dL (ref 0.44–1.00)
GFR calc Af Amer: >60 mL/min (ref >60)
GFR calc non Af Amer: >60 mL/min (ref >60)
Glucose, Bld: 82 mg/dL (ref 70–99)
Potassium: 3.6 mmol/L (ref 3.5–5.1)
Sodium: 138 mmol/L (ref 135–145)

## 2019-02-16 MED ORDER — HYDROXYCHLOROQUINE SULFATE 200 MG PO TABS
200.0000 mg | ORAL_TABLET | Freq: Two times a day (BID) | ORAL | Status: DC
  Administered 2019-02-16 – 2019-02-19 (×7): 200 mg via ORAL
  Filled 2019-02-16 (×7): qty 1

## 2019-02-16 NOTE — Progress Notes (Signed)
Patient ID: Denise Payne, female   DOB: [birth year], 73 y.o.   MRN: [id number] [facility] Surgery Progress Note:   * No surgery found *  Subjective: Mental status is clear;  She is having more loose BMs and that could be from the antibiotics-with diverticultiis, that can be a good thing Objective: Vital signs in last 24 hours: Temp:  [98.8 F (37.1 C)-98.9 F (37.2 C)] 98.9 F (37.2 C) (05/03 0400) Pulse Rate:  [75-86] 75 (05/03 0400) BP: (138-153)/(55-77) 153/62 (05/03 0400) SpO2:  [99 %-100 %] 99 % (05/03 0400)  Intake/Output from previous day: 05/02 0701 - 05/03 0700 In: 960 [P.O.:960] Out: -  Intake/Output this shift: Total I/O In: 240 [P.O.:240] Out: -   Physical Exam: Work of breathing is normal.  Pain is about the same.    Lab Results:  Results for orders placed or performed during the hospital encounter of 02/14/19 (from the past 48 hour(s))  Lipase, blood     Status: None   Collection Time: 02/14/19 12:36 PM  Result Value Ref Range   Lipase 31 11 - 51 U/L    Comment: Performed at [facility], [place] [address] [place], [place] [address]  Comprehensive metabolic panel     Status: Abnormal   Collection Time: 02/14/19 12:36 PM  Result Value Ref Range   Sodium 137 135 - 145 mmol/L   Potassium 4.7 3.5 - 5.1 mmol/L   Chloride 104 98 - 111 mmol/L   CO2 21 (L) 22 - 32 mmol/L   Glucose, Bld 114 (H) 70 - 99 mg/dL   BUN 13 8 - 23 mg/dL   Creatinine, Ser 0.86 0.44 - 1.00 mg/dL   Calcium 9.3 8.9 - 10.3 mg/dL   Total Protein 7.0 6.5 - 8.1 g/dL   Albumin 3.1 (L) 3.5 - 5.0 g/dL   AST 45 (H) 15 - 41 U/L   ALT 24 0 - 44 U/L   Alkaline Phosphatase 126 38 - 126 U/L   Total Bilirubin 1.5 (H) 0.3 - 1.2 mg/dL   GFR calc non Af Amer >60 >60 mL/min   GFR calc Af Amer >60 >60 mL/min   Anion gap 12 5 - 15    Comment: Performed at [facility] [address], [place], [place] [address]  CBC     Status: Abnormal   Collection Time: 02/14/19 12:36 PM  Result Value  Ref Range   WBC 22.4 (H) 4.0 - 10.5 K/uL   RBC 4.29 3.87 - 5.11 MIL/uL   Hemoglobin 11.8 (L) 12.0 - 15.0 g/dL   HCT 36.8 36.0 - 46.0 %   MCV 85.8 80.0 - 100.0 fL   MCH 27.5 26.0 - 34.0 pg   MCHC 32.1 30.0 - 36.0 g/dL   RDW 13.2 11.5 - 15.5 %   Platelets 576 (H) 150 - 400 K/uL   nRBC 0.0 0.0 - 0.2 %    Comment: Performed at [facility], [place] [address] [place], [place] [address]  Urinalysis, Routine w reflex microscopic     Status: Abnormal   Collection Time: 02/14/19  1:03 PM  Result Value Ref Range   Color, Urine AMBER (A) YELLOW    Comment: BIOCHEMICALS MAY BE AFFECTED BY COLOR   APPearance HAZY (A) CLEAR   Specific Gravity, Urine 1.026 1.005 - 1.030   pH 5.0 5.0 - 8.0   Glucose, UA NEGATIVE NEGATIVE mg/dL   Hgb urine dipstick NEGATIVE NEGATIVE   Bilirubin Urine NEGATIVE NEGATIVE  Ketones, ur NEGATIVE NEGATIVE mg/dL   Protein, ur 30 (A) NEGATIVE mg/dL   Nitrite NEGATIVE NEGATIVE   Leukocytes,Ua MODERATE (A) NEGATIVE   RBC / HPF 0-5 0 - 5 RBC/hpf   WBC, UA >50 (H) 0 - 5 WBC/hpf   Bacteria, UA RARE (A) NONE SEEN   Squamous Epithelial / LPF 0-5 0 - 5   WBC Clumps PRESENT    Mucus PRESENT    Budding Yeast PRESENT    Hyaline Casts, UA PRESENT     Comment: Performed at [facility] [address] [place], [place] [address]  Urine culture     Status: Abnormal   Collection Time: 02/14/19  3:21 PM  Result Value Ref Range   Specimen Description URINE, RANDOM    Special Requests      NONE Performed at [facility], [place] [address] [place], [place] [address]    Culture 60,000 COLONIES/mL YEAST (A)    Report Status 02/15/2019 FINAL   Blood culture (routine x 2)     Status: None (Preliminary result)   Collection Time: 02/14/19  5:16 PM  Result Value Ref Range   Specimen Description BLOOD SITE NOT SPECIFIED    Special Requests      BOTTLES DRAWN AEROBIC AND ANAEROBIC Blood Culture results may not be optimal due to an inadequate volume of blood received in  culture bottles   Culture      NO GROWTH 2 DAYS Performed at [facility], [place] [address], [place], [place] [address]    Report Status PENDING   Blood culture (routine x 2)     Status: None (Preliminary result)   Collection Time: 02/14/19  5:35 PM  Result Value Ref Range   Specimen Description BLOOD RIGHT HAND    Special Requests      BOTTLES DRAWN AEROBIC AND ANAEROBIC Blood Culture results may not be optimal due to an inadequate volume of blood received in culture bottles   Culture      NO GROWTH 2 DAYS Performed at [facility], [place] [address] [place], [place] [address]    Report Status PENDING   Basic metabolic panel     Status: Abnormal   Collection Time: 02/15/19  2:22 AM  Result Value Ref Range   Sodium 136 135 - 145 mmol/L   Potassium 4.0 3.5 - 5.1 mmol/L   Chloride 106 98 - 111 mmol/L   CO2 22 22 - 32 mmol/L   Glucose, Bld 88 70 - 99 mg/dL   BUN 12 8 - 23 mg/dL   Creatinine, Ser 0.70 0.44 - 1.00 mg/dL   Calcium 8.5 (L) 8.9 - 10.3 mg/dL   GFR calc non Af Amer >60 >60 mL/min   GFR calc Af Amer >60 >60 mL/min   Anion gap 8 5 - 15    Comment: Performed at [facility] [address], [place], [place] [address]  CBC     Status: Abnormal   Collection Time: 02/15/19  2:22 AM  Result Value Ref Range   WBC 16.0 (H) 4.0 - 10.5 K/uL   RBC 3.38 (L) 3.87 - 5.11 MIL/uL   Hemoglobin 9.3 (L) 12.0 - 15.0 g/dL    Comment: REPEATED TO VERIFY DELTA CHECK NOTED    HCT 28.8 (L) 36.0 - 46.0 %   MCV 85.2 80.0 - 100.0 fL   MCH 27.5 26.0 - 34.0 pg   MCHC 32.3 30.0 - 36.0 g/dL   RDW  13.2 11.5 - 15.5 %   Platelets 406 (H) 150 - 400 K/uL   nRBC 0.0 0.0 - 0.2 %    Comment: Performed at [facility], [place] [address] [place], [place] [address]  CBC with Differential/Platelet     Status: Abnormal   Collection Time: 02/16/19  5:14 AM  Result Value Ref Range   WBC 8.2 4.0 - 10.5 K/uL   RBC 3.26 (L) 3.87 - 5.11 MIL/uL   Hemoglobin 9.0 (L) 12.0 - 15.0 g/dL   HCT  27.9 (L) 36.0 - 46.0 %   MCV 85.6 80.0 - 100.0 fL   MCH 27.6 26.0 - 34.0 pg   MCHC 32.3 30.0 - 36.0 g/dL   RDW 13.2 11.5 - 15.5 %   Platelets 365 150 - 400 K/uL   nRBC 0.0 0.0 - 0.2 %   Neutrophils Relative % 70 %   Neutro Abs 5.8 1.7 - 7.7 K/uL   Lymphocytes Relative 18 %   Lymphs Abs 1.5 0.7 - 4.0 K/uL   Monocytes Relative 7 %   Monocytes Absolute 0.6 0.1 - 1.0 K/uL   Eosinophils Relative 3 %   Eosinophils Absolute 0.2 0.0 - 0.5 K/uL   Basophils Relative 1 %   Basophils Absolute 0.0 0.0 - 0.1 K/uL   Immature Granulocytes 1 %   Abs Immature Granulocytes 0.06 0.00 - 0.07 K/uL    Comment: Performed at [facility] [address] [place], [place] [address]  Basic metabolic panel     Status: Abnormal   Collection Time: 02/16/19  5:14 AM  Result Value Ref Range   Sodium 138 135 - 145 mmol/L   Potassium 3.6 3.5 - 5.1 mmol/L   Chloride 107 98 - 111 mmol/L   CO2 21 (L) 22 - 32 mmol/L   Glucose, Bld 82 70 - 99 mg/dL   BUN 6 (L) 8 - 23 mg/dL   Creatinine, Ser 0.73 0.44 - 1.00 mg/dL   Calcium 8.4 (L) 8.9 - 10.3 mg/dL   GFR calc non Af Amer >60 >60 mL/min   GFR calc Af Amer >60 >60 mL/min   Anion gap 10 5 - 15    Comment: Performed at [facility], [place] [address], [place], [place] [address]    Radiology/Results: Ct Abdomen Pelvis W Contrast  Result Date: 02/14/2019 CLINICAL DATA:  Patient was recently admitted for diverticulitis with continue abdomen pain. EXAM: CT ABDOMEN AND PELVIS WITH CONTRAST TECHNIQUE: Multidetector CT imaging of the abdomen and pelvis was performed using the standard protocol following bolus administration of intravenous contrast. CONTRAST:  168mL OMNIPAQUE IOHEXOL 300 MG/ML  SOLN COMPARISON:  February 07, 2019. FINDINGS: Lower chest: Minimal atelectasis of the lung bases are noted. The heart size is normal. Hepatobiliary: No focal liver abnormality is seen. Gallstones are identified in the gallbladder. No inflammatory changes noted around the  gallbladder. No biliary dilatation. Pancreas: Unremarkable. No pancreatic ductal dilatation or surrounding inflammatory changes. Spleen: Normal in size without focal abnormality. Adrenals/Urinary Tract: Adrenal glands are unremarkable. Kidneys are normal, without renal calculi, focal lesion, or hydronephrosis. The previously noted enhancing fluid collection at the dome of the bladder is stable in size but the collection currently has air suggesting focal abscess. Stomach/Bowel: Inflammatory changes surrounding the proximal sigmoid colon is slightly worsened. There are multiple moderate distended small bowel loops in the upper and mid abdomen with transition in the lower mid abdomen at the site of mesentery inflammation of nearby sigmoid colon suggesting  possible developing small bowel obstruction. The stomach is normal.  The appendix is normal. Vascular/Lymphatic: Aortic atherosclerosis. No enlarged abdominal or pelvic lymph nodes. Reproductive: Uterus and bilateral adnexa are unremarkable. Other: The previously seen small abscesses in lower abdomen are probably not significantly changed in size but there is current air formation within the abscess not previously seen. Musculoskeletal: Degenerative joint changes of the spine are identified. IMPRESSION: Inflammatory changes surrounding the proximal sigmoid colon is slightly worsened compared to prior exam with worsened interval question developing small bowel obstruction as described. The previously noted small abscesses in the lower abdomen probably not significantly changed in size but there is current air formation noted within the abscesses not previously seen. Electronically Signed   By: Abelardo Diesel M.D.   On: 02/14/2019 15:57    Anti-infectives: Anti-infectives (From admission, onward)   Start     Dose/Rate Route Frequency Ordered Stop   02/14/19 1800  ciprofloxacin (CIPRO) IVPB 400 mg     400 mg 200 mL/hr over 60 Minutes Intravenous Every 12 hours  02/14/19 1711     02/14/19 1800  metroNIDAZOLE (FLAGYL) IVPB 500 mg     500 mg 100 mL/hr over 60 Minutes Intravenous Every 8 hours 02/14/19 1711     02/14/19 1700  piperacillin-tazobactam (ZOSYN) IVPB 3.375 g  Status:  Discontinued     3.375 g 100 mL/hr over 30 Minutes Intravenous  Once 02/14/19 1652 02/14/19 1711      Assessment/Plan: Problem List: Patient Active Problem List   Diagnosis Date Noted  . 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 Bowel perforation ([place])   . Asthma 01/26/2019  . Sjogren's syndrome ([place]) 01/26/2019  . Diverticulitis of large intestine with perforation 01/25/2019  . 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  She is still having pain.  Dr. Dema Severin has seen her an will defer to him.  * No surgery found *    LOS: 2 days   Matt B. Hassell Done, MD, [facility] [phone] beeper [phone]  02/16/2019 9:22 AM

## 2019-02-16 NOTE — Progress Notes (Signed)
PROGRESS NOTE    Denise Payne  ZTI:[id number] DOB: Aug 10, 1946 DOA: 02/14/2019 PCP: Leeroy Cha, MD   Brief Narrative:  Per HPI: [name] a1 y.o.femaleretired RN with a history of persistent asthma and Sjogren's syndrome on plaquenilpresents to the ED with abdominal pain and persistent emesis without blood but with occasional bile.Marland KitchenMarland KitchenMarland KitchenNo fevers no chills Patient was admitted on 01/25/2019 with acute sigmoid diverticulitis with microperforation and colovesical fistula----treated with IV Zosyn and discharged home on Augmentin on 02/08/2019----- No fevers......., Notachypnea No dysuria or hematuria  In ED.....WBC is up to 22.4 from 12 .6 about 6 days ago... repeat CT abdomen and pelvis shows worsening findings including air formation noted within the abscess --- Surgical consult from Dr. Dorena Cookey, recommends IV Cipro and Flagyl   Assessment & Plan:   Principal Problem:   Acute sigmoid diverticulitis with microperforation and colovesical fistula/Colonic diverticular abscess Active Problems:   Diverticulitis of large intestine with perforation   Asthma   Sjogren's syndrome ([place])   Sigmoid diverticulitis with microperforation colovesical fistula.  As noted by admitting physician patient had Worsening clinical symptoms, WBC is up to 22.4 from 12 .6 about 6 days ago... repeat CT abdomen and pelvis shows worsening findings including air formation noted within the abscess,patient was recently admitted from 01/24/2021 02/08/2019 for same,treated with IV Zosyn and discharged home on p.o. Augmentin.  Patient was seen by general surgery with recommendation for Cipro and Flagyl which we will continue.  Continue on clear liquid diet PRN antiemetics and analgesics.  Blood cultures are negative to date x1 and white blood cell has decreased from 22.4>16>8.2.  We will continue IV antibiotics, continue monitoring labs follow-up cultures.  Additional recommendations  per general surgery.  Asthma.  Patient is not in exacerbation she stable continue Singulair and as needed nebulizers.  Sjogren's syndrome.  This has been stable, patient's been on Plaquenil for 8 years being held secondary to concerns for immunosuppression. Restarting today  Normocytic anemia.  Patient mildly iron deficient.  We will continue home medications is no evidence of ongoing blood loss.  Anticipate iron on discharge  Hypertension.  Patient has a history of hypertension although was noted to not be on blood pressure medications prior to admission, IV hydralazine made available on a as needed basis.   DVT prophylaxis: SCD/Compression stockings  Code Status: Full    Code Status Orders  (From admission, onward)         Start     Ordered   02/14/19 1821  Full code  Continuous     02/14/19 1820        Code Status History    Date Active Date Inactive Code Status Order ID Comments User Context   01/25/2019 1936 02/08/2019 1605 Full Code [id number]  Elie Confer, MD ED     Family Communication: None today Disposition Plan:   Patient will remain inpatient an additional day for IV antibiotics, culture results, further subspecialty evaluation by general surgery.  These interventions are necessary as patient would likely suffer overwhelming infection likely sepsis and hemodynamic collapse without them. Consults called: None Admission status: Inpatient   Consultants:   gen surg  Procedures:  Ct Abdomen Pelvis W Contrast  Result Date: 02/14/2019 CLINICAL DATA:  Patient was recently admitted for diverticulitis with continue abdomen pain. EXAM: CT ABDOMEN AND PELVIS WITH CONTRAST TECHNIQUE: Multidetector CT imaging of the abdomen and pelvis was performed using the standard protocol following bolus administration of intravenous contrast. CONTRAST:  153mL OMNIPAQUE IOHEXOL 300 MG/ML  SOLN  COMPARISON:  February 07, 2019. FINDINGS: Lower chest: Minimal atelectasis of the lung  bases are noted. The heart size is normal. Hepatobiliary: No focal liver abnormality is seen. Gallstones are identified in the gallbladder. No inflammatory changes noted around the gallbladder. No biliary dilatation. Pancreas: Unremarkable. No pancreatic ductal dilatation or surrounding inflammatory changes. Spleen: Normal in size without focal abnormality. Adrenals/Urinary Tract: Adrenal glands are unremarkable. Kidneys are normal, without renal calculi, focal lesion, or hydronephrosis. The previously noted enhancing fluid collection at the dome of the bladder is stable in size but the collection currently has air suggesting focal abscess. Stomach/Bowel: Inflammatory changes surrounding the proximal sigmoid colon is slightly worsened. There are multiple moderate distended small bowel loops in the upper and mid abdomen with transition in the lower mid abdomen at the site of mesentery inflammation of nearby sigmoid colon suggesting possible developing small bowel obstruction. The stomach is normal.  The appendix is normal. Vascular/Lymphatic: Aortic atherosclerosis. No enlarged abdominal or pelvic lymph nodes. Reproductive: Uterus and bilateral adnexa are unremarkable. Other: The previously seen small abscesses in lower abdomen are probably not significantly changed in size but there is current air formation within the abscess not previously seen. Musculoskeletal: Degenerative joint changes of the spine are identified. IMPRESSION: Inflammatory changes surrounding the proximal sigmoid colon is slightly worsened compared to prior exam with worsened interval question developing small bowel obstruction as described. The previously noted small abscesses in the lower abdomen probably not significantly changed in size but there is current air formation noted within the abscesses not previously seen. Electronically Signed   By: Abelardo Diesel M.D.   On: 02/14/2019 15:57   Ct Abdomen Pelvis W Contrast  Result Date:  02/07/2019 CLINICAL DATA:  Diverticular abscess follow-up. EXAM: CT ABDOMEN AND PELVIS WITH CONTRAST TECHNIQUE: Multidetector CT imaging of the abdomen and pelvis was performed using the standard protocol following bolus administration of intravenous contrast. CONTRAST:  129mL ISOVUE-300 IOPAMIDOL (ISOVUE-300) INJECTION 61% COMPARISON:  CT abdomen pelvis dated January 31, 2019. FINDINGS: Lower chest: Improving now small right and trace left pleural effusions and bilateral lower lobe subsegmental atelectasis. Hepatobiliary: No focal liver abnormality. Unchanged cholelithiasis. No gallbladder wall thickening or biliary dilatation. Pancreas: Unremarkable. No pancreatic ductal dilatation or surrounding inflammatory changes. Spleen: Normal in size without focal abnormality. Adrenals/Urinary Tract: Adrenal glands are unremarkable. Kidneys are normal, without renal calculi, focal lesion, or hydronephrosis. Rim enhancing fluid collection at the dome of the bladder has decreased in size, now measuring 13 x 12 mm, previously 17 x 15 mm. Stomach/Bowel: Inflammatory changes surrounding the proximal sigmoid colon appear mildly improved. Extensive sigmoid diverticulosis again noted. Mildly distended small bowel loops in the upper abdomen are again noted, improved when compared to prior study, with transition point again seen in the lower mid abdomen near the site of mesenteric inflammatory changes. Contrast reaches the colon. The stomach is within normal limits.  Normal appendix. Vascular/Lymphatic: Aortic atherosclerosis. No enlarged abdominal or pelvic lymph nodes. Reactive mesenteric lymph nodes again noted. Reproductive: Status post hysterectomy. No adnexal masses. Other: The previously seen small abscesses in the lower abdomen have decreased in size. For example, the largest collection now measures 3.0 x 1.1 cm, previously 3.6 x 1.5 cm. No new fluid collection. Musculoskeletal: No acute or significant osseous findings.  IMPRESSION: 1. Improving sigmoid diverticulitis with interval decrease in size of several abscesses in the lower abdomen and along the bladder dome. No new fluid collection. 2. Improved partial small bowel obstruction with transition point in the  lower abdomen adjacent to the mesenteric inflammation. 3. Decreased bilateral pleural effusions. 4. Unchanged cholelithiasis. 5.  Aortic atherosclerosis (ICD10-I70.0). Electronically Signed   By: Titus Dubin M.D.   On: 02/07/2019 14:52   Ct Abdomen Pelvis W Contrast  Result Date: 01/31/2019 CLINICAL DATA:  Follow-up diverticulitis EXAM: CT ABDOMEN AND PELVIS WITH CONTRAST TECHNIQUE: Multidetector CT imaging of the abdomen and pelvis was performed using the standard protocol following bolus administration of intravenous contrast. CONTRAST:  116mL OMNIPAQUE IOHEXOL 300 MG/ML  SOLN COMPARISON:  01/25/2019 FINDINGS: Lower chest: Moderate right right and small left pleural effusion with dependent atelectasis. Interlobular septal thickening at the lung bases likely represents a small amount of edema. Hepatobiliary: Cholelithiasis.  Liver is unremarkable. Pancreas: Unremarkable Spleen: Calcified granuloma.  Otherwise unremarkable. Adrenals/Urinary Tract: Kidneys are within normal limits. Adrenal glands are unremarkable. Bladder contains a small amount of gas. There is a 1.7 x 1.5 cm fluid and gas collection at the dome of the bladder of presenting either a large diverticulum or possibly a small abscess. These findings raise the possibility of fistula between sigmoid colon and bladder. Stomach/Bowel: There are inflammatory changes of the sigmoid colon adjacent to the dome of the bladder. This is characterized by wall thickening and stranding in the adjacent fat. Diverticulosis of the sigmoid and descending colon are noted. There is a gas and fluid collection inseparable from the sigmoid colon and dome of the bladder representing either a large inflamed diverticulum or  possibly a small abscess. This may indicate a fistula. Distended small bowel loops with air-fluid levels are present in the upper abdomen. Distal small bowel loops are decompressed. The transition point is in the inflammatory process within the pelvis. Vascular/Lymphatic: Atherosclerotic vascular calcifications. No abnormal retroperitoneal adenopathy. Several prominent but non pathologically enlarged mesenteric nodes are noted and are likely inflammatory in nature. Reproductive: Uterus is absent. Other: There is stranding and inflammatory changes within the anterior pelvic fat adjacent to the area of diverticulitis. Multiple very small gas and fluid filled abscesses are present in the lower abdomen and upper pelvis. The largest measures 3.6 x 1.5 cm. A loop of bowel is seen anterior to this fluid collection. Small amount of free fluid about the liver. Musculoskeletal: No vertebral compression deformity. IMPRESSION: Findings are again consistent with acute sigmoid diverticulitis. There are findings worrisome for a fistula between the sigmoid colon and bladder as described above. There are gas and fluid filled collections in the lower abdomen and upper pelvis compatible with multiple small abscesses. The largest is 3.6 x 1.5 cm. It is posterior to a loop of bowel and safe access for percutaneous drainage is not possible. Partial small bowel obstruction pattern. The transition point is in the pelvis at the site of the inflammatory process. Cholelithiasis. Bilateral pleural effusions. Electronically Signed   By: Marybelle Killings M.D.   On: 01/31/2019 11:51   Ct Abdomen Pelvis W Contrast  Result Date: 01/25/2019 CLINICAL DATA:  Diffuse abdominal pain. History of GERD, nausea. EXAM: CT ABDOMEN AND PELVIS WITH CONTRAST TECHNIQUE: Multidetector CT imaging of the abdomen and pelvis was performed using the standard protocol following bolus administration of intravenous contrast. CONTRAST:  162mL OMNIPAQUE IOHEXOL 300 MG/ML   SOLN COMPARISON:  None. FINDINGS: Lower chest: Mild bibasilar scarring/atelectasis. Hepatobiliary: Numerous stones within the a otherwise unremarkable gallbladder. No focal liver abnormality. No significant bile duct dilatation seen. Pancreas: Unremarkable. No pancreatic ductal dilatation or surrounding inflammatory changes. Spleen: Normal in size without focal abnormality. Adrenals/Urinary Tract: Adrenal glands appear normal.  Kidneys are unremarkable without mass, stone or hydronephrosis. No perinephric fluid. Air within the bladder. Stomach/Bowel: Extensive diverticulosis throughout the colon. Inflammation/fluid stranding is seen about the sigmoid colon indicating acute diverticulitis. The inflamed segment of bowel is contiguous with the anterior-superior margin of the bladder concerning for fistula. No dilated large or small bowel loops. Stomach is unremarkable, decompressed. Appendix is normal. Vascular/Lymphatic: Aortic atherosclerosis. No acute appearing vascular abnormality. Numerous small lymph nodes within the lower mesentery, likely reactive in nature. Reproductive: Presumed hysterectomy. No adnexal mass. Other: Small amount of free intraperitoneal air within the central abdomen and upper abdomen. No abscess collection seen. Musculoskeletal: No acute or suspicious osseous finding. IMPRESSION: 1. Diverticulitis of the sigmoid colon. The inflamed segment of sigmoid colon is contiguous with the anterior-superior margin of the bladder indicating colovesical fistula, with associated air in the bladder. No circumscribed abscess collection identified. 2. Free intraperitoneal air within the abdomen and pelvis indicating associated bowel perforation, likely micro perforation given the small amount of free air. 3. Cholelithiasis without evidence of acute cholecystitis. These results were called by telephone at the time of interpretation on 01/25/2019 at 6:25 pm to Dr. Jola Schmidt , who verbally acknowledged these  results. Aortic Atherosclerosis (ICD10-I70.0). Electronically Signed   By: Franki Cabot M.D.   On: 01/25/2019 18:27     Antimicrobials:   cipro flagyl day 3   Subjective: Pt reports multiple soft small BM/s, still feels nauseous Pain is improving  Objective: Vitals:   02/15/19 0434 02/15/19 1447 02/15/19 1934 02/16/19 0400  BP: (!) 125/48 (!) 144/55 138/77 (!) 153/62  Pulse: 79 83 86 75  Resp: 18     Temp: 98.4 F (36.9 C) 98.9 F (37.2 C) 98.8 F (37.1 C) 98.9 F (37.2 C)  TempSrc: Oral Oral Oral Oral  SpO2: 97% 100% 99% 99%  Weight:      Height:        Intake/Output Summary (Last 24 hours) at 02/16/2019 1325 Last data filed at 02/16/2019 6606 Gross per 24 hour  Intake 600 ml  Output --  Net 600 ml   Filed Weights   02/14/19 1748  Weight: 78.9 kg    Examination:  General exam: Appears calm and comfortable  Respiratory system: Clear to auscultation. Respiratory effort normal. Cardiovascular system: S1 & S2 heard, RRR. No JVD, murmurs, rubs, gallops or clicks. No pedal edema. Gastrointestinal system: Abdomen is nondistended,quiet BS, no /rg, mildly TTP. Central nervous system: Alert and oriented. No focal neurological deficits. Extremities: WWP, no edema Skin: No rashes, lesions or ulcers Psychiatry: Judgement and insight appear normal. Mood & affect appropriate.     Data Reviewed: I have personally reviewed following labs and imaging studies  CBC: Recent Labs  Lab 02/14/19 1236 02/15/19 0222 02/16/19 0514  WBC 22.4* 16.0* 8.2  NEUTROABS  --   --  5.8  HGB 11.8* 9.3* 9.0*  HCT 36.8 28.8* 27.9*  MCV 85.8 85.2 85.6  PLT 576* 406* 301   Basic Metabolic Panel: Recent Labs  Lab 02/14/19 1236 02/15/19 0222 02/16/19 0514  NA 137 136 138  K 4.7 4.0 3.6  CL 104 106 107  CO2 21* 22 21*  GLUCOSE 114* 88 82  BUN 13 12 6*  CREATININE 0.86 0.70 0.73  CALCIUM 9.3 8.5* 8.4*   GFR: Estimated Creatinine Clearance: 61.6 mL/min (by C-G formula based on  SCr of 0.73 mg/dL). Liver Function Tests: Recent Labs  Lab 02/14/19 1236  AST 45*  ALT 24  ALKPHOS 126  BILITOT 1.5*  PROT 7.0  ALBUMIN 3.1*   Recent Labs  Lab 02/14/19 1236  LIPASE 31   No results for input(s): AMMONIA in the last 168 hours. Coagulation Profile: No results for input(s): INR, PROTIME in the last 168 hours. Cardiac Enzymes: No results for input(s): CKTOTAL, CKMB, CKMBINDEX, TROPONINI in the last 168 hours. BNP (last 3 results) No results for input(s): PROBNP in the last 8760 hours. HbA1C: No results for input(s): HGBA1C in the last 72 hours. CBG: No results for input(s): GLUCAP in the last 168 hours. Lipid Profile: No results for input(s): CHOL, HDL, LDLCALC, TRIG, CHOLHDL, LDLDIRECT in the last 72 hours. Thyroid Function Tests: No results for input(s): TSH, T4TOTAL, FREET4, T3FREE, THYROIDAB in the last 72 hours. Anemia Panel: No results for input(s): VITAMINB12, FOLATE, FERRITIN, TIBC, IRON, RETICCTPCT in the last 72 hours. Sepsis Labs: No results for input(s): PROCALCITON, LATICACIDVEN in the last 168 hours.  Recent Results (from the past 240 hour(s))  Urine culture     Status: Abnormal   Collection Time: 02/14/19  3:21 PM  Result Value Ref Range Status   Specimen Description URINE, RANDOM  Final   Special Requests   Final    NONE Performed at [facility], [address], [place], [place] [address]    Culture 60,000 COLONIES/mL YEAST (A)  Final   Report Status 02/15/2019 FINAL  Final  Blood culture (routine x 2)     Status: None (Preliminary result)   Collection Time: 02/14/19  5:16 PM  Result Value Ref Range Status   Specimen Description BLOOD SITE NOT SPECIFIED  Final   Special Requests   Final    BOTTLES DRAWN AEROBIC AND ANAEROBIC Blood Culture results may not be optimal due to an inadequate volume of blood received in culture bottles   Culture   Final    NO GROWTH 2 DAYS Performed at [facility], [place] [address]  [place], [place] [address]    Report Status PENDING  Incomplete  Blood culture (routine x 2)     Status: None (Preliminary result)   Collection Time: 02/14/19  5:35 PM  Result Value Ref Range Status   Specimen Description BLOOD RIGHT HAND  Final   Special Requests   Final    BOTTLES DRAWN AEROBIC AND ANAEROBIC Blood Culture results may not be optimal due to an inadequate volume of blood received in culture bottles   Culture   Final    NO GROWTH 2 DAYS Performed at [facility], [place] [address], [place], [place] [address]    Report Status PENDING  Incomplete         Radiology Studies: Ct Abdomen Pelvis W Contrast  Result Date: 02/14/2019 CLINICAL DATA:  Patient was recently admitted for diverticulitis with continue abdomen pain. EXAM: CT ABDOMEN AND PELVIS WITH CONTRAST TECHNIQUE: Multidetector CT imaging of the abdomen and pelvis was performed using the standard protocol following bolus administration of intravenous contrast. CONTRAST:  136mL OMNIPAQUE IOHEXOL 300 MG/ML  SOLN COMPARISON:  February 07, 2019. FINDINGS: Lower chest: Minimal atelectasis of the lung bases are noted. The heart size is normal. Hepatobiliary: No focal liver abnormality is seen. Gallstones are identified in the gallbladder. No inflammatory changes noted around the gallbladder. No biliary dilatation. Pancreas: Unremarkable. No pancreatic ductal dilatation or surrounding inflammatory changes. Spleen: Normal in size without focal abnormality. Adrenals/Urinary Tract: Adrenal glands are unremarkable. Kidneys are normal, without renal calculi, focal lesion, or hydronephrosis. The previously noted enhancing  fluid collection at the dome of the bladder is stable in size but the collection currently has air suggesting focal abscess. Stomach/Bowel: Inflammatory changes surrounding the proximal sigmoid colon is slightly worsened. There are multiple moderate distended small bowel loops in the upper and mid abdomen with transition in the  lower mid abdomen at the site of mesentery inflammation of nearby sigmoid colon suggesting possible developing small bowel obstruction. The stomach is normal.  The appendix is normal. Vascular/Lymphatic: Aortic atherosclerosis. No enlarged abdominal or pelvic lymph nodes. Reproductive: Uterus and bilateral adnexa are unremarkable. Other: The previously seen small abscesses in lower abdomen are probably not significantly changed in size but there is current air formation within the abscess not previously seen. Musculoskeletal: Degenerative joint changes of the spine are identified. IMPRESSION: Inflammatory changes surrounding the proximal sigmoid colon is slightly worsened compared to prior exam with worsened interval question developing small bowel obstruction as described. The previously noted small abscesses in the lower abdomen probably not significantly changed in size but there is current air formation noted within the abscesses not previously seen. Electronically Signed   By: Abelardo Diesel M.D.   On: 02/14/2019 15:57        Scheduled Meds:  aspirin EC  81 mg Oral QPM   calcium carbonate  1,250 mg Oral BID WC   heparin  5,000 Units Subcutaneous Q8H   hydroxychloroquine  200 mg Oral BID   montelukast  10 mg Oral QHS    morphine injection  4 mg Intravenous Once   pantoprazole  40 mg Oral Daily   sodium chloride flush  3 mL Intravenous Q12H   Continuous Infusions:  sodium chloride     sodium chloride 75 mL/hr at 02/16/19 0529   ciprofloxacin 400 mg (02/16/19 0532)   metronidazole 500 mg (02/16/19 1209)     LOS: 2 days    Time spent: 63 min    Nicolette Bang, MD [facility]  If 7PM-7AM, please contact night-coverage  02/16/2019, 1:25 PM

## 2019-02-17 LAB — CBC WITH DIFFERENTIAL/PLATELET
Abs Immature Granulocytes: 0.06 10*3/uL (ref 0.00–0.07)
Basophils Absolute: 0.1 10*3/uL (ref 0.0–0.1)
Basophils Relative: 1 %
Eosinophils Absolute: 0.2 10*3/uL (ref 0.0–0.5)
Eosinophils Relative: 2 %
HCT: 27.2 % — ABNORMAL LOW (ref 36.0–46.0)
Hemoglobin: 8.7 g/dL — ABNORMAL LOW (ref 12.0–15.0)
Immature Granulocytes: 1 %
Lymphocytes Relative: 20 %
Lymphs Abs: 1.7 10*3/uL (ref 0.7–4.0)
MCH: 27.2 pg (ref 26.0–34.0)
MCHC: 32 g/dL (ref 30.0–36.0)
MCV: 85 fL (ref 80.0–100.0)
Monocytes Absolute: 0.6 10*3/uL (ref 0.1–1.0)
Monocytes Relative: 8 %
Neutro Abs: 5.7 10*3/uL (ref 1.7–7.7)
Neutrophils Relative %: 68 %
Platelets: 346 10*3/uL (ref 150–400)
RBC: 3.2 MIL/uL — ABNORMAL LOW (ref 3.87–5.11)
RDW: 13.1 % (ref 11.5–15.5)
WBC: 8.2 10*3/uL (ref 4.0–10.5)
nRBC: 0 % (ref 0.0–0.2)

## 2019-02-17 LAB — BASIC METABOLIC PANEL
Anion gap: 10 (ref 5–15)
BUN: <5 mg/dL — ABNORMAL LOW (ref 8–23)
CO2: 21 mmol/L — ABNORMAL LOW (ref 22–32)
Calcium: 8.2 mg/dL — ABNORMAL LOW (ref 8.9–10.3)
Chloride: 105 mmol/L (ref 98–111)
Creatinine, Ser: 0.67 mg/dL (ref 0.44–1.00)
GFR calc Af Amer: >60 mL/min (ref >60)
GFR calc non Af Amer: >60 mL/min (ref >60)
Glucose, Bld: 78 mg/dL (ref 70–99)
Potassium: 3.3 mmol/L — ABNORMAL LOW (ref 3.5–5.1)
Sodium: 136 mmol/L (ref 135–145)

## 2019-02-17 MED ORDER — PROMETHAZINE HCL 25 MG/ML IJ SOLN
12.5000 mg | Freq: Four times a day (QID) | INTRAMUSCULAR | Status: DC | PRN
  Administered 2019-02-17: 12.5 mg via INTRAVENOUS
  Filled 2019-02-17 (×4): qty 1

## 2019-02-17 MED ORDER — POTASSIUM CHLORIDE CRYS ER 20 MEQ PO TBCR
40.0000 meq | EXTENDED_RELEASE_TABLET | Freq: Two times a day (BID) | ORAL | Status: AC
  Filled 2019-02-17: qty 2

## 2019-02-17 MED ORDER — POTASSIUM CHLORIDE 10 MEQ/100ML IV SOLN
10.0000 meq | INTRAVENOUS | Status: AC
  Administered 2019-02-17 – 2019-02-18 (×4): 10 meq via INTRAVENOUS
  Filled 2019-02-17 (×4): qty 100

## 2019-02-17 MED ORDER — PROMETHAZINE HCL 25 MG/ML IJ SOLN
12.5000 mg | Freq: Four times a day (QID) | INTRAMUSCULAR | Status: DC | PRN
  Administered 2019-02-17 – 2019-02-18 (×2): 12.5 mg via INTRAVENOUS

## 2019-02-17 MED ORDER — SACCHAROMYCES BOULARDII 250 MG PO CAPS
250.0000 mg | ORAL_CAPSULE | Freq: Two times a day (BID) | ORAL | Status: DC
  Administered 2019-02-17 – 2019-02-19 (×5): 250 mg via ORAL
  Filled 2019-02-17 (×5): qty 1

## 2019-02-17 NOTE — TOC Initial Note (Signed)
Transition of Care [facility]) - Initial/Assessment Note    Patient Details  Name: Siyah Mault MRN: [id number] Date of Birth: [birth year]  [facility]) CM/SW Contact:    Marilu Favre, RN Phone Number: 02/17/2019, 11:32 AM  Clinical Narrative:                 Patient from home alone. At discharge her daughter will stay with her. Her son lives 3 minutes away.   Will follow for any needs.   Expected Discharge Plan: Home/Self Care Barriers to Discharge: Continued Medical Work up   Patient Goals and CMS Choice Patient states their goals for this hospitalization and ongoing recovery are:: to go home  CMS Medicare.gov Compare Post Acute Care list provided to:: Patient Choice offered to / list presented to : NA  Expected Discharge Plan and Services Expected Discharge Plan: Home/Self Care       Living arrangements for the past 2 months: Single Family Home                 DME Arranged: N/A         HH Arranged: NA [place] Agency: NA        Prior Living Arrangements/Services Living arrangements for the past 2 months: Single Family Home Lives with:: Self Patient language and need for interpreter reviewed:: Yes Do you feel safe going back to the place where you live?: Yes      Need for Family Participation in Patient Care: Yes (Comment) Care giver support system in place?: Yes (comment)   Criminal Activity/Legal Involvement Pertinent to Current Situation/Hospitalization: No - Comment as needed  Activities of Daily Living Home Assistive Devices/Equipment: Eyeglasses ADL Screening (condition at time of admission) Patient's cognitive ability adequate to safely complete daily activities?: Yes Is the patient deaf or have difficulty hearing?: No Does the patient have difficulty seeing, even when wearing glasses/contacts?: No Does the patient have difficulty concentrating, remembering, or making decisions?: No Patient able to express need for assistance with ADLs?:  Yes Does the patient have difficulty dressing or bathing?: No Independently performs ADLs?: Yes (appropriate for developmental age) Does the patient have difficulty walking or climbing stairs?: No Weakness of Legs: None Weakness of Arms/Hands: None  Permission Sought/Granted                  Emotional Assessment Appearance:: Appears stated age Attitude/Demeanor/Rapport: Engaged Affect (typically observed): Accepting Orientation: : Oriented to Self, Oriented to Place, Oriented to  Time, Oriented to Situation   Psych Involvement: No (comment)  Admission diagnosis:  LLQ pain [R10.32] Intra-abdominal abscess ([place]) [K65.1] Patient Active Problem List   Diagnosis Date Noted  . 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 Bowel perforation ([place])   . Asthma 01/26/2019  . Sjogren's syndrome ([place]) 01/26/2019  . Diverticulitis of large intestine with perforation 01/25/2019  . 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

## 2019-02-17 NOTE — Progress Notes (Signed)
[facility] Progress Note     Subjective: CC-  Overall abdominal pain significantly improved, but states that she still feels "bleh." Some nausea and dry heaving this morning, but she associates this with the antibiotics. Tolerating clear liquids without increased pain or nausea. States that she has had numerous bouts of diarrhea.  WBC 8.2, afebrile.  Objective: Vital signs in last 24 hours: Temp:  [98.5 F (36.9 C)-98.7 F (37.1 C)] 98.5 F (36.9 C) (05/04 0626) Pulse Rate:  [77-81] 81 (05/04 0626) BP: (135-155)/(58-64) 135/58 (05/04 0626) SpO2:  [97 %-99 %] 97 % (05/04 0626) Last BM Date: 02/16/19  Intake/Output from previous day: 05/03 0701 - 05/04 0700 In: 2804.2 [P.O.:240; I.V.:1407.8; IV Piggyback:1156.4] Out: -  Intake/Output this shift: Total I/O In: 225 [P.O.:225] Out: -   PE: Gen:  Alert, NAD, pleasant HEENT: EOM's intact, pupils equal and round Pulm:  effort normal Abd: Soft, NT/ND, +BS, no HSM Ext:  Calves soft and nontender Psych: A&Ox3  Skin: no rashes noted, warm and dry  Lab Results:  Recent Labs    02/16/19 0514 02/17/19 0222  WBC 8.2 8.2  HGB 9.0* 8.7*  HCT 27.9* 27.2*  PLT 365 346   BMET Recent Labs    02/16/19 0514 02/17/19 0222  NA 138 136  K 3.6 3.3*  CL 107 105  CO2 21* 21*  GLUCOSE 82 78  BUN 6* <5*  CREATININE 0.73 0.67  CALCIUM 8.4* 8.2*   PT/INR No results for input(s): LABPROT, INR in the last 72 hours. CMP     Component Value Date/Time   NA 136 02/17/2019 0222   K 3.3 (L) 02/17/2019 0222   CL 105 02/17/2019 0222   CO2 21 (L) 02/17/2019 0222   GLUCOSE 78 02/17/2019 0222   BUN <5 (L) 02/17/2019 0222   CREATININE 0.67 02/17/2019 0222   CALCIUM 8.2 (L) 02/17/2019 0222   PROT 7.0 02/14/2019 1236   ALBUMIN 3.1 (L) 02/14/2019 1236   AST 45 (H) 02/14/2019 1236   ALT 24 02/14/2019 1236   ALKPHOS 126 02/14/2019 1236   BILITOT 1.5 (H) 02/14/2019 1236   GFRNONAA >60 02/17/2019 0222   GFRAA >60 02/17/2019  0222   Lipase     Component Value Date/Time   LIPASE 31 02/14/2019 1236       Studies/Results: No results found.  Anti-infectives: Anti-infectives (From admission, onward)   Start     Dose/Rate Route Frequency Ordered Stop   02/16/19 1000  hydroxychloroquine (PLAQUENIL) tablet 200 mg     200 mg Oral 2 times daily 02/16/19 0958     02/14/19 1800  ciprofloxacin (CIPRO) IVPB 400 mg     400 mg 200 mL/hr over 60 Minutes Intravenous Every 12 hours 02/14/19 1711     02/14/19 1800  metroNIDAZOLE (FLAGYL) IVPB 500 mg     500 mg 100 mL/hr over 60 Minutes Intravenous Every 8 hours 02/14/19 1711     02/14/19 1700  piperacillin-tazobactam (ZOSYN) IVPB 3.375 g  Status:  Discontinued     3.375 g 100 mL/hr over 30 Minutes Intravenous  Once 02/14/19 1652 02/14/19 1711       Assessment/Plan Asthma Sjogren's syndrome   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 Add probiotic. Replace potassium. Continue IV abx.    LOS: 3 days    Wellington Hampshire , [facility] 02/17/2019, 9:51 AM Pager: [phone] Mon-Thurs 7:00 am-4:30 pm Fri 7:00 am -11:30 AM Sat-Sun 7:00 am-11:30 am

## 2019-02-17 NOTE — Progress Notes (Signed)
PROGRESS NOTE    Denise Payne  YNW:[id number] DOB: 09-29-1946 DOA: 02/14/2019 PCP: Leeroy Cha, MD   Brief Narrative:  Per HPI: [name] a77 y.o.femaleretired RN with a history of persistent asthma and Sjogren's syndrome on plaquenilpresents to the ED with abdominal pain and persistent emesis without blood but with occasional bile.Marland KitchenMarland KitchenMarland KitchenNo fevers no chills Patient was admitted on 01/25/2019 with acute sigmoid diverticulitis with microperforation and colovesical fistula----treated with IV Zosyn and discharged home on Augmentin on 02/08/2019----- No fevers......., Notachypnea No dysuria or hematuria  In ED.....WBC is up to 22.4 from 12 .6 about 6 days ago... repeat CT abdomen and pelvis shows worsening findings including air formation noted within the abscess --- Surgical consult from Dr. Dorena Cookey, recommends IV Cipro and Flagyl   Assessment & Plan:   Principal Problem:   Acute sigmoid diverticulitis with microperforation and colovesical fistula/Colonic diverticular abscess Active Problems:   Diverticulitis of large intestine with perforation   Asthma   Sjogren's syndrome (Denise Payne)   Sigmoid diverticulitis with microperforation colovesical fistula. As noted by admitting physician patient had Worsening clinical symptoms, WBC is up to 22.4 from 12 .6 about 6 days ago... repeat CT abdomen and pelvis shows worsening findings including air formation noted within the abscess,patient was recently admitted from 01/24/2021 02/08/2019 for same,treated with IV Zosyn and discharged home on p.o. Augmentin. Patient was seen by general surgery with recommendation for Cipro and Flagyl which we will continue. Continue on clear liquid diet PRN antiemetics and analgesics. Blood cultures are negative to date x1 and white blood cell has decreased from 22.4>16>8.2. We will continue IV antibiotics, continue monitoring labs follow-up cultures. Additional recommendations  per general surgery.  Asthma. Patient is not in exacerbation she stable continue Singulair and as needed nebulizers.  Sjogren's syndrome. This has been stable,patient's been on Plaquenil for 8 years being held secondary to concerns for immunosuppression.Restarting today  Normocytic anemia. Patient mildly iron deficient. We will continue home medications is no evidence of ongoing blood loss. Anticipate iron on discharge  Hypertension. Patient has a history of hypertension although was noted to not be on blood pressure medications prior to admission, IV hydralazine made available on a as needed basis.   DVT prophylaxis: SCD/Compression stockings  Code Status: Full    Code Status Orders  (From admission, onward)         Start     Ordered   02/14/19 1821  Full code  Continuous     02/14/19 1820        Code Status History    Date Active Date Inactive Code Status Order ID Comments User Context   01/25/2019 1936 02/08/2019 1605 Full Code [id number]  Elie Confer, MD ED     Family Communication: none today  Disposition Plan:   Patient will remain inpatient an additional day for IV antibiotics, culture results, further subspecialty evaluation by general surgery. These interventions are necessary as patient would likely suffer overwhelming infection likely sepsis and hemodynamic collapse without them. Consults called: None Admission status: Inpatient   Consultants:   gen surg  Procedures:  Ct Abdomen Pelvis W Contrast  Result Date: 02/14/2019 CLINICAL DATA:  Patient was recently admitted for diverticulitis with continue abdomen pain. EXAM: CT ABDOMEN AND PELVIS WITH CONTRAST TECHNIQUE: Multidetector CT imaging of the abdomen and pelvis was performed using the standard protocol following bolus administration of intravenous contrast. CONTRAST:  176mL OMNIPAQUE IOHEXOL 300 MG/ML  SOLN COMPARISON:  February 07, 2019. FINDINGS: Lower chest: Minimal atelectasis of the lung  bases are noted. The heart size is normal. Hepatobiliary: No focal liver abnormality is seen. Gallstones are identified in the gallbladder. No inflammatory changes noted around the gallbladder. No biliary dilatation. Pancreas: Unremarkable. No pancreatic ductal dilatation or surrounding inflammatory changes. Spleen: Normal in size without focal abnormality. Adrenals/Urinary Tract: Adrenal glands are unremarkable. Kidneys are normal, without renal calculi, focal lesion, or hydronephrosis. The previously noted enhancing fluid collection at the dome of the bladder is stable in size but the collection currently has air suggesting focal abscess. Stomach/Bowel: Inflammatory changes surrounding the proximal sigmoid colon is slightly worsened. There are multiple moderate distended small bowel loops in the upper and mid abdomen with transition in the lower mid abdomen at the site of mesentery inflammation of nearby sigmoid colon suggesting possible developing small bowel obstruction. The stomach is normal.  The appendix is normal. Vascular/Lymphatic: Aortic atherosclerosis. No enlarged abdominal or pelvic lymph nodes. Reproductive: Uterus and bilateral adnexa are unremarkable. Other: The previously seen small abscesses in lower abdomen are probably not significantly changed in size but there is current air formation within the abscess not previously seen. Musculoskeletal: Degenerative joint changes of the spine are identified. IMPRESSION: Inflammatory changes surrounding the proximal sigmoid colon is slightly worsened compared to prior exam with worsened interval question developing small bowel obstruction as described. The previously noted small abscesses in the lower abdomen probably not significantly changed in size but there is current air formation noted within the abscesses not previously seen. Electronically Signed   By: Abelardo Diesel M.D.   On: 02/14/2019 15:57   Ct Abdomen Pelvis W Contrast  Result Date:  02/07/2019 CLINICAL DATA:  Diverticular abscess follow-up. EXAM: CT ABDOMEN AND PELVIS WITH CONTRAST TECHNIQUE: Multidetector CT imaging of the abdomen and pelvis was performed using the standard protocol following bolus administration of intravenous contrast. CONTRAST:  153mL ISOVUE-300 IOPAMIDOL (ISOVUE-300) INJECTION 61% COMPARISON:  CT abdomen pelvis dated January 31, 2019. FINDINGS: Lower chest: Improving now small right and trace left pleural effusions and bilateral lower lobe subsegmental atelectasis. Hepatobiliary: No focal liver abnormality. Unchanged cholelithiasis. No gallbladder wall thickening or biliary dilatation. Pancreas: Unremarkable. No pancreatic ductal dilatation or surrounding inflammatory changes. Spleen: Normal in size without focal abnormality. Adrenals/Urinary Tract: Adrenal glands are unremarkable. Kidneys are normal, without renal calculi, focal lesion, or hydronephrosis. Rim enhancing fluid collection at the dome of the bladder has decreased in size, now measuring 13 x 12 mm, previously 17 x 15 mm. Stomach/Bowel: Inflammatory changes surrounding the proximal sigmoid colon appear mildly improved. Extensive sigmoid diverticulosis again noted. Mildly distended small bowel loops in the upper abdomen are again noted, improved when compared to prior study, with transition point again seen in the lower mid abdomen near the site of mesenteric inflammatory changes. Contrast reaches the colon. The stomach is within normal limits.  Normal appendix. Vascular/Lymphatic: Aortic atherosclerosis. No enlarged abdominal or pelvic lymph nodes. Reactive mesenteric lymph nodes again noted. Reproductive: Status post hysterectomy. No adnexal masses. Other: The previously seen small abscesses in the lower abdomen have decreased in size. For example, the largest collection now measures 3.0 x 1.1 cm, previously 3.6 x 1.5 cm. No new fluid collection. Musculoskeletal: No acute or significant osseous findings.  IMPRESSION: 1. Improving sigmoid diverticulitis with interval decrease in size of several abscesses in the lower abdomen and along the bladder dome. No new fluid collection. 2. Improved partial small bowel obstruction with transition point in the lower abdomen adjacent to the mesenteric inflammation. 3. Decreased bilateral pleural effusions. 4. Unchanged  cholelithiasis. 5.  Aortic atherosclerosis (ICD10-I70.0). Electronically Signed   By: Titus Dubin M.D.   On: 02/07/2019 14:52   Ct Abdomen Pelvis W Contrast  Result Date: 01/31/2019 CLINICAL DATA:  Follow-up diverticulitis EXAM: CT ABDOMEN AND PELVIS WITH CONTRAST TECHNIQUE: Multidetector CT imaging of the abdomen and pelvis was performed using the standard protocol following bolus administration of intravenous contrast. CONTRAST:  149mL OMNIPAQUE IOHEXOL 300 MG/ML  SOLN COMPARISON:  01/25/2019 FINDINGS: Lower chest: Moderate right right and small left pleural effusion with dependent atelectasis. Interlobular septal thickening at the lung bases likely represents a small amount of edema. Hepatobiliary: Cholelithiasis.  Liver is unremarkable. Pancreas: Unremarkable Spleen: Calcified granuloma.  Otherwise unremarkable. Adrenals/Urinary Tract: Kidneys are within normal limits. Adrenal glands are unremarkable. Bladder contains a small amount of gas. There is a 1.7 x 1.5 cm fluid and gas collection at the dome of the bladder of presenting either a large diverticulum or possibly a small abscess. These findings raise the possibility of fistula between sigmoid colon and bladder. Stomach/Bowel: There are inflammatory changes of the sigmoid colon adjacent to the dome of the bladder. This is characterized by wall thickening and stranding in the adjacent fat. Diverticulosis of the sigmoid and descending colon are noted. There is a gas and fluid collection inseparable from the sigmoid colon and dome of the bladder representing either a large inflamed diverticulum or  possibly a small abscess. This may indicate a fistula. Distended small bowel loops with air-fluid levels are present in the upper abdomen. Distal small bowel loops are decompressed. The transition point is in the inflammatory process within the pelvis. Vascular/Lymphatic: Atherosclerotic vascular calcifications. No abnormal retroperitoneal adenopathy. Several prominent but non pathologically enlarged mesenteric nodes are noted and are likely inflammatory in nature. Reproductive: Uterus is absent. Other: There is stranding and inflammatory changes within the anterior pelvic fat adjacent to the area of diverticulitis. Multiple very small gas and fluid filled abscesses are present in the lower abdomen and upper pelvis. The largest measures 3.6 x 1.5 cm. A loop of bowel is seen anterior to this fluid collection. Small amount of free fluid about the liver. Musculoskeletal: No vertebral compression deformity. IMPRESSION: Findings are again consistent with acute sigmoid diverticulitis. There are findings worrisome for a fistula between the sigmoid colon and bladder as described above. There are gas and fluid filled collections in the lower abdomen and upper pelvis compatible with multiple small abscesses. The largest is 3.6 x 1.5 cm. It is posterior to a loop of bowel and safe access for percutaneous drainage is not possible. Partial small bowel obstruction pattern. The transition point is in the pelvis at the site of the inflammatory process. Cholelithiasis. Bilateral pleural effusions. Electronically Signed   By: Marybelle Killings M.D.   On: 01/31/2019 11:51   Ct Abdomen Pelvis W Contrast  Result Date: 01/25/2019 CLINICAL DATA:  Diffuse abdominal pain. History of GERD, nausea. EXAM: CT ABDOMEN AND PELVIS WITH CONTRAST TECHNIQUE: Multidetector CT imaging of the abdomen and pelvis was performed using the standard protocol following bolus administration of intravenous contrast. CONTRAST:  132mL OMNIPAQUE IOHEXOL 300 MG/ML   SOLN COMPARISON:  None. FINDINGS: Lower chest: Mild bibasilar scarring/atelectasis. Hepatobiliary: Numerous stones within the a otherwise unremarkable gallbladder. No focal liver abnormality. No significant bile duct dilatation seen. Pancreas: Unremarkable. No pancreatic ductal dilatation or surrounding inflammatory changes. Spleen: Normal in size without focal abnormality. Adrenals/Urinary Tract: Adrenal glands appear normal. Kidneys are unremarkable without mass, stone or hydronephrosis. No perinephric fluid. Air within the  bladder. Stomach/Bowel: Extensive diverticulosis throughout the colon. Inflammation/fluid stranding is seen about the sigmoid colon indicating acute diverticulitis. The inflamed segment of bowel is contiguous with the anterior-superior margin of the bladder concerning for fistula. No dilated large or small bowel loops. Stomach is unremarkable, decompressed. Appendix is normal. Vascular/Lymphatic: Aortic atherosclerosis. No acute appearing vascular abnormality. Numerous small lymph nodes within the lower mesentery, likely reactive in nature. Reproductive: Presumed hysterectomy. No adnexal mass. Other: Small amount of free intraperitoneal air within the central abdomen and upper abdomen. No abscess collection seen. Musculoskeletal: No acute or suspicious osseous finding. IMPRESSION: 1. Diverticulitis of the sigmoid colon. The inflamed segment of sigmoid colon is contiguous with the anterior-superior margin of the bladder indicating colovesical fistula, with associated air in the bladder. No circumscribed abscess collection identified. 2. Free intraperitoneal air within the abdomen and pelvis indicating associated bowel perforation, likely micro perforation given the small amount of free air. 3. Cholelithiasis without evidence of acute cholecystitis. These results were called by telephone at the time of interpretation on 01/25/2019 at 6:25 pm to Dr. Jola Schmidt , who verbally acknowledged these  results. Aortic Atherosclerosis (ICD10-I70.0). Electronically Signed   By: Franki Cabot M.D.   On: 01/25/2019 18:27     Antimicrobials:   cipro flagyl day 4    Subjective: Still reports some nausea Multiple soft BM  Objective: Vitals:   02/16/19 0400 02/16/19 1458 02/16/19 1951 02/17/19 0626  BP: (!) 153/62 (!) 155/64 (!) 153/63 (!) 135/58  Pulse: 75 79 77 81  Resp:      Temp: 98.9 F (37.2 C) 98.6 F (37 C) 98.7 F (37.1 C) 98.5 F (36.9 C)  TempSrc: Oral Oral Oral Oral  SpO2: 99% 99% 98% 97%  Weight:      Height:        Intake/Output Summary (Last 24 hours) at 02/17/2019 1334 Last data filed at 02/17/2019 0800 Gross per 24 hour  Intake 2789.16 ml  Output --  Net 2789.16 ml   Filed Weights   02/14/19 1748  Weight: 78.9 kg    Examination:  General exam: Appears calm and comfortable  Respiratory system: Clear to auscultation. Respiratory effort normal. Cardiovascular system: S1 & S2 heard, RRR. No JVD, murmurs, rubs, gallops or clicks. No pedal edema. Gastrointestinal system: Abdomen is nondistended, soft and  MILDLY tender. Normal bowel sounds heard. Central nervous system: Alert and oriented. No focal neurological deficits. Extremities: wwp, no edema Skin: No rashes, lesions or ulcers Psychiatry: Judgement and insight appear normal. Mood & affect appropriate.     Data Reviewed: I have personally reviewed following labs and imaging studies  CBC: Recent Labs  Lab 02/14/19 1236 02/15/19 0222 02/16/19 0514 02/17/19 0222  WBC 22.4* 16.0* 8.2 8.2  NEUTROABS  --   --  5.8 5.7  HGB 11.8* 9.3* 9.0* 8.7*  HCT 36.8 28.8* 27.9* 27.2*  MCV 85.8 85.2 85.6 85.0  PLT 576* 406* 365 425   Basic Metabolic Panel: Recent Labs  Lab 02/14/19 1236 02/15/19 0222 02/16/19 0514 02/17/19 0222  NA 137 136 138 136  K 4.7 4.0 3.6 3.3*  CL 104 106 107 105  CO2 21* 22 21* 21*  GLUCOSE 114* 88 82 78  BUN 13 12 6* <5*  CREATININE 0.86 0.70 0.73 0.67  CALCIUM 9.3 8.5*  8.4* 8.2*   GFR: Estimated Creatinine Clearance: 61.6 mL/min (by C-G formula based on SCr of 0.67 mg/dL). Liver Function Tests: Recent Labs  Lab 02/14/19 1236  AST 45*  ALT  24  ALKPHOS 126  BILITOT 1.5*  PROT 7.0  ALBUMIN 3.1*   Recent Labs  Lab 02/14/19 1236  LIPASE 31   No results for input(s): AMMONIA in the last 168 hours. Coagulation Profile: No results for input(s): INR, PROTIME in the last 168 hours. Cardiac Enzymes: No results for input(s): CKTOTAL, CKMB, CKMBINDEX, TROPONINI in the last 168 hours. BNP (last 3 results) No results for input(s): PROBNP in the last 8760 hours. HbA1C: No results for input(s): HGBA1C in the last 72 hours. CBG: No results for input(s): GLUCAP in the last 168 hours. Lipid Profile: No results for input(s): CHOL, HDL, LDLCALC, TRIG, CHOLHDL, LDLDIRECT in the last 72 hours. Thyroid Function Tests: No results for input(s): TSH, T4TOTAL, FREET4, T3FREE, THYROIDAB in the last 72 hours. Anemia Panel: No results for input(s): VITAMINB12, FOLATE, FERRITIN, TIBC, IRON, RETICCTPCT in the last 72 hours. Sepsis Labs: No results for input(s): PROCALCITON, LATICACIDVEN in the last 168 hours.  Recent Results (from the past 240 hour(s))  Urine culture     Status: Abnormal   Collection Time: 02/14/19  3:21 PM  Result Value Ref Range Status   Specimen Description URINE, RANDOM  Final   Special Requests   Final    NONE Performed at [facility], [address] [place], [place] [address]    Culture 60,000 COLONIES/mL YEAST (A)  Final   Report Status 02/15/2019 FINAL  Final  Blood culture (routine x 2)     Status: None (Preliminary result)   Collection Time: 02/14/19  5:16 PM  Result Value Ref Range Status   Specimen Description BLOOD SITE NOT SPECIFIED  Final   Special Requests   Final    BOTTLES DRAWN AEROBIC AND ANAEROBIC Blood Culture results may not be optimal due to an inadequate volume of blood received in culture bottles   Culture    Final    NO GROWTH 3 DAYS Performed at [facility], [place] [address], [place], [place] [address]    Report Status PENDING  Incomplete  Blood culture (routine x 2)     Status: None (Preliminary result)   Collection Time: 02/14/19  5:35 PM  Result Value Ref Range Status   Specimen Description BLOOD RIGHT HAND  Final   Special Requests   Final    BOTTLES DRAWN AEROBIC AND ANAEROBIC Blood Culture results may not be optimal due to an inadequate volume of blood received in culture bottles   Culture   Final    NO GROWTH 3 DAYS Performed at [facility], [place] [address] [place], [place] [address]    Report Status PENDING  Incomplete         Radiology Studies: No results found.      Scheduled Meds:  aspirin EC  81 mg Oral QPM   calcium carbonate  1,250 mg Oral BID WC   heparin  5,000 Units Subcutaneous Q8H   hydroxychloroquine  200 mg Oral BID   montelukast  10 mg Oral QHS    morphine injection  4 mg Intravenous Once   pantoprazole  40 mg Oral Daily   potassium chloride  40 mEq Oral BID   saccharomyces boulardii  250 mg Oral BID   sodium chloride flush  3 mL Intravenous Q12H   Continuous Infusions:  sodium chloride     sodium chloride Stopped (02/17/19 0227)   ciprofloxacin 400 mg (02/17/19 0549)   metronidazole 500 mg (02/17/19 1119)   potassium chloride  LOS: 3 days    Time spent: 35 min    Nicolette Bang, MD [facility]  If 7PM-7AM, please contact night-coverage  02/17/2019, 1:34 PM

## 2019-02-18 LAB — BASIC METABOLIC PANEL
Anion gap: 12 (ref 5–15)
BUN: <5 mg/dL — ABNORMAL LOW (ref 8–23)
CO2: 21 mmol/L — ABNORMAL LOW (ref 22–32)
Calcium: 8.4 mg/dL — ABNORMAL LOW (ref 8.9–10.3)
Chloride: 106 mmol/L (ref 98–111)
Creatinine, Ser: 0.64 mg/dL (ref 0.44–1.00)
GFR calc Af Amer: >60 mL/min (ref >60)
GFR calc non Af Amer: >60 mL/min (ref >60)
Glucose, Bld: 97 mg/dL (ref 70–99)
Potassium: 3 mmol/L — ABNORMAL LOW (ref 3.5–5.1)
Sodium: 139 mmol/L (ref 135–145)

## 2019-02-18 LAB — CBC WITH DIFFERENTIAL/PLATELET
Abs Immature Granulocytes: 0.09 10*3/uL — ABNORMAL HIGH (ref 0.00–0.07)
Basophils Absolute: 0.1 10*3/uL (ref 0.0–0.1)
Basophils Relative: 1 %
Eosinophils Absolute: 0.1 10*3/uL (ref 0.0–0.5)
Eosinophils Relative: 1 %
HCT: 29.8 % — ABNORMAL LOW (ref 36.0–46.0)
Hemoglobin: 9.5 g/dL — ABNORMAL LOW (ref 12.0–15.0)
Immature Granulocytes: 1 %
Lymphocytes Relative: 14 %
Lymphs Abs: 1.4 10*3/uL (ref 0.7–4.0)
MCH: 26.7 pg (ref 26.0–34.0)
MCHC: 31.9 g/dL (ref 30.0–36.0)
MCV: 83.7 fL (ref 80.0–100.0)
Monocytes Absolute: 0.7 10*3/uL (ref 0.1–1.0)
Monocytes Relative: 7 %
Neutro Abs: 8.2 10*3/uL — ABNORMAL HIGH (ref 1.7–7.7)
Neutrophils Relative %: 76 %
Platelets: 384 10*3/uL (ref 150–400)
RBC: 3.56 MIL/uL — ABNORMAL LOW (ref 3.87–5.11)
RDW: 13 % (ref 11.5–15.5)
WBC: 10.6 10*3/uL — ABNORMAL HIGH (ref 4.0–10.5)
nRBC: 0 % (ref 0.0–0.2)

## 2019-02-18 MED ORDER — POTASSIUM CHLORIDE 10 MEQ/100ML IV SOLN: 10.0000 meq | INTRAVENOUS | Status: DC

## 2019-02-18 MED ORDER — POTASSIUM CHLORIDE 10 MEQ/100ML IV SOLN
10.0000 meq | INTRAVENOUS | Status: AC
  Administered 2019-02-18 (×3): 10 meq via INTRAVENOUS
  Filled 2019-02-18 (×3): qty 100

## 2019-02-18 MED ORDER — POTASSIUM CHLORIDE 10 MEQ/100ML IV SOLN
10.0000 meq | Freq: Once | INTRAVENOUS | Status: AC
  Administered 2019-02-18: 10 meq via INTRAVENOUS
  Filled 2019-02-18: qty 100

## 2019-02-18 MED ORDER — GUAIFENESIN ER 600 MG PO TB12
600.0000 mg | ORAL_TABLET | Freq: Two times a day (BID) | ORAL | Status: DC
  Administered 2019-02-18 – 2019-02-19 (×3): 600 mg via ORAL
  Filled 2019-02-18 (×3): qty 1

## 2019-02-18 MED ORDER — CIPROFLOXACIN HCL 500 MG PO TABS
500.0000 mg | ORAL_TABLET | Freq: Two times a day (BID) | ORAL | Status: DC
  Administered 2019-02-18 – 2019-02-19 (×2): 500 mg via ORAL
  Filled 2019-02-18 (×2): qty 1

## 2019-02-18 NOTE — Progress Notes (Signed)
PROGRESS NOTE    Denise Payne  IRS:[id number] DOB: [birth year] DOA: 02/14/2019 PCP: Leeroy Cha, MD   Brief Narrative:  Per HPI: Denise Payne a62 y.o.femaleretired RN with a history of persistent asthma and Sjogren's syndrome on plaquenilpresents to the ED with abdominal pain and persistent emesis without blood but with occasional bile.Marland KitchenMarland KitchenMarland KitchenNo fevers no chills Patient was admitted on 01/25/2019 with acute sigmoid diverticulitis with microperforation and colovesical fistula----treated with IV Zosyn and discharged home on Augmentin on 02/08/2019----- No fevers......., Notachypnea No dysuria or hematuria  In ED.....WBC is up to 22.4 from 12 .6 about 6 days ago... repeat CT abdomen and pelvis shows worsening findings including air formation noted within the abscess --- Surgical consult from Dr. Dorena Cookey, recommends IV Cipro and Flagyl  Brief hospital course since admission: Patient was placed on IV Cipro and Flagyl which he tolerated with the exception of some nausea.  She continues to have bowel movements and is being followed by general surgery.  Flagyl was discontinued by general surgery and Cipro was changed to p.o. antibiotics May 5.  Continue to monitor if patient does not improve anticipate resection  Assessment & Plan:   Principal Problem:   Acute sigmoid diverticulitis with microperforation and colovesical fistula/Colonic diverticular abscess Active Problems:   Diverticulitis of large intestine with perforation   Asthma   Sjogren's syndrome ([place])   Sigmoid diverticulitis with microperforation colovesical fistula. As noted by admitting physician patient had Worsening clinical symptoms, WBC is up to 22.4 from 12 .6 about 6 days ago... repeat CT abdomen and pelvis shows worsening findings including air formation noted within the abscess,patient was recently admitted from 01/24/2021 02/08/2019 for same,treated with IV Zosyn and discharged home on  p.o. Augmentin. Patient was seen by general surgery with recommendation for Cipro and Flagyl which we will continue. Continue on clear liquid diet PRN antiemetics and analgesics. Blood cultures are negative to date and white blood cell has been normal x 2 then bumped to 10.6 today,  surgery discontinued Flagyl and converted Cipro to p.o, they continue to follow for improvement versus resection.  Asthma. Patient is not in exacerbation she stable continue Singulair and as needed nebulizers.  Sjogren's syndrome. This has been stable,patient's been on Plaquenil for 8 years this was initially held but has been resumed.  Patient very anxious about not being on her Plaquenil  Normocytic anemia. Patient mildly iron deficient. We will continue home medications is no evidence of ongoing blood loss. Anticipate iron on discharge  Hypertension. Patient has a history of hypertension although was noted to not be on blood pressure medications prior to admission, IV hydralazine made available on a as needed basis.  DVT prophylaxis: Heparin SQ  Code Status: Full    Code Status Orders  (From admission, onward)         Start     Ordered   02/14/19 1821  Full code  Continuous     02/14/19 1820        Code Status History    Date Active Date Inactive Code Status Order ID Comments User Context   01/25/2019 1936 02/08/2019 1605 Full Code [id number]  Elie Confer, MD ED     Family Communication: [facility] [phone],  Disposition Plan:    Patient will remain inpatient an additional day for IV antibiotics, culture results, further subspecialty evaluation by general surgery with possible surgery. These interventions are necessary as patient would likely suffer overwhelming infection likely sepsis and hemodynamic collapse without them. Consults called: None  Admission status: Inpatient   Consultants:   gen surgery  Procedures:  Ct Abdomen Pelvis W Contrast  Result Date:  02/14/2019 CLINICAL DATA:  Patient was recently admitted for diverticulitis with continue abdomen pain. EXAM: CT ABDOMEN AND PELVIS WITH CONTRAST TECHNIQUE: Multidetector CT imaging of the abdomen and pelvis was performed using the standard protocol following bolus administration of intravenous contrast. CONTRAST:  19mL OMNIPAQUE IOHEXOL 300 MG/ML  SOLN COMPARISON:  February 07, 2019. FINDINGS: Lower chest: Minimal atelectasis of the lung bases are noted. The heart size is normal. Hepatobiliary: No focal liver abnormality is seen. Gallstones are identified in the gallbladder. No inflammatory changes noted around the gallbladder. No biliary dilatation. Pancreas: Unremarkable. No pancreatic ductal dilatation or surrounding inflammatory changes. Spleen: Normal in size without focal abnormality. Adrenals/Urinary Tract: Adrenal glands are unremarkable. Kidneys are normal, without renal calculi, focal lesion, or hydronephrosis. The previously noted enhancing fluid collection at the dome of the bladder is stable in size but the collection currently has air suggesting focal abscess. Stomach/Bowel: Inflammatory changes surrounding the proximal sigmoid colon is slightly worsened. There are multiple moderate distended small bowel loops in the upper and mid abdomen with transition in the lower mid abdomen at the site of mesentery inflammation of nearby sigmoid colon suggesting possible developing small bowel obstruction. The stomach is normal.  The appendix is normal. Vascular/Lymphatic: Aortic atherosclerosis. No enlarged abdominal or pelvic lymph nodes. Reproductive: Uterus and bilateral adnexa are unremarkable. Other: The previously seen small abscesses in lower abdomen are probably not significantly changed in size but there is current air formation within the abscess not previously seen. Musculoskeletal: Degenerative joint changes of the spine are identified. IMPRESSION: Inflammatory changes surrounding the proximal sigmoid  colon is slightly worsened compared to prior exam with worsened interval question developing small bowel obstruction as described. The previously noted small abscesses in the lower abdomen probably not significantly changed in size but there is current air formation noted within the abscesses not previously seen. Electronically Signed   By: Abelardo Diesel M.D.   On: 02/14/2019 15:57   Ct Abdomen Pelvis W Contrast  Result Date: 02/07/2019 CLINICAL DATA:  Diverticular abscess follow-up. EXAM: CT ABDOMEN AND PELVIS WITH CONTRAST TECHNIQUE: Multidetector CT imaging of the abdomen and pelvis was performed using the standard protocol following bolus administration of intravenous contrast. CONTRAST:  179mL ISOVUE-300 IOPAMIDOL (ISOVUE-300) INJECTION 61% COMPARISON:  CT abdomen pelvis dated January 31, 2019. FINDINGS: Lower chest: Improving now small right and trace left pleural effusions and bilateral lower lobe subsegmental atelectasis. Hepatobiliary: No focal liver abnormality. Unchanged cholelithiasis. No gallbladder wall thickening or biliary dilatation. Pancreas: Unremarkable. No pancreatic ductal dilatation or surrounding inflammatory changes. Spleen: Normal in size without focal abnormality. Adrenals/Urinary Tract: Adrenal glands are unremarkable. Kidneys are normal, without renal calculi, focal lesion, or hydronephrosis. Rim enhancing fluid collection at the dome of the bladder has decreased in size, now measuring 13 x 12 mm, previously 17 x 15 mm. Stomach/Bowel: Inflammatory changes surrounding the proximal sigmoid colon appear mildly improved. Extensive sigmoid diverticulosis again noted. Mildly distended small bowel loops in the upper abdomen are again noted, improved when compared to prior study, with transition point again seen in the lower mid abdomen near the site of mesenteric inflammatory changes. Contrast reaches the colon. The stomach is within normal limits.  Normal appendix. Vascular/Lymphatic: Aortic  atherosclerosis. No enlarged abdominal or pelvic lymph nodes. Reactive mesenteric lymph nodes again noted. Reproductive: Status post hysterectomy. No adnexal masses. Other: The previously seen small abscesses  in the lower abdomen have decreased in size. For example, the largest collection now measures 3.0 x 1.1 cm, previously 3.6 x 1.5 cm. No new fluid collection. Musculoskeletal: No acute or significant osseous findings. IMPRESSION: 1. Improving sigmoid diverticulitis with interval decrease in size of several abscesses in the lower abdomen and along the bladder dome. No new fluid collection. 2. Improved partial small bowel obstruction with transition point in the lower abdomen adjacent to the mesenteric inflammation. 3. Decreased bilateral pleural effusions. 4. Unchanged cholelithiasis. 5.  Aortic atherosclerosis (ICD10-I70.0). Electronically Signed   By: Titus Dubin M.D.   On: 02/07/2019 14:52   Ct Abdomen Pelvis W Contrast  Result Date: 01/31/2019 CLINICAL DATA:  Follow-up diverticulitis EXAM: CT ABDOMEN AND PELVIS WITH CONTRAST TECHNIQUE: Multidetector CT imaging of the abdomen and pelvis was performed using the standard protocol following bolus administration of intravenous contrast. CONTRAST:  162mL OMNIPAQUE IOHEXOL 300 MG/ML  SOLN COMPARISON:  01/25/2019 FINDINGS: Lower chest: Moderate right right and small left pleural effusion with dependent atelectasis. Interlobular septal thickening at the lung bases likely represents a small amount of edema. Hepatobiliary: Cholelithiasis.  Liver is unremarkable. Pancreas: Unremarkable Spleen: Calcified granuloma.  Otherwise unremarkable. Adrenals/Urinary Tract: Kidneys are within normal limits. Adrenal glands are unremarkable. Bladder contains a small amount of gas. There is a 1.7 x 1.5 cm fluid and gas collection at the dome of the bladder of presenting either a large diverticulum or possibly a small abscess. These findings raise the possibility of fistula  between sigmoid colon and bladder. Stomach/Bowel: There are inflammatory changes of the sigmoid colon adjacent to the dome of the bladder. This is characterized by wall thickening and stranding in the adjacent fat. Diverticulosis of the sigmoid and descending colon are noted. There is a gas and fluid collection inseparable from the sigmoid colon and dome of the bladder representing either a large inflamed diverticulum or possibly a small abscess. This may indicate a fistula. Distended small bowel loops with air-fluid levels are present in the upper abdomen. Distal small bowel loops are decompressed. The transition point is in the inflammatory process within the pelvis. Vascular/Lymphatic: Atherosclerotic vascular calcifications. No abnormal retroperitoneal adenopathy. Several prominent but non pathologically enlarged mesenteric nodes are noted and are likely inflammatory in nature. Reproductive: Uterus is absent. Other: There is stranding and inflammatory changes within the anterior pelvic fat adjacent to the area of diverticulitis. Multiple very small gas and fluid filled abscesses are present in the lower abdomen and upper pelvis. The largest measures 3.6 x 1.5 cm. A loop of bowel is seen anterior to this fluid collection. Small amount of free fluid about the liver. Musculoskeletal: No vertebral compression deformity. IMPRESSION: Findings are again consistent with acute sigmoid diverticulitis. There are findings worrisome for a fistula between the sigmoid colon and bladder as described above. There are gas and fluid filled collections in the lower abdomen and upper pelvis compatible with multiple small abscesses. The largest is 3.6 x 1.5 cm. It is posterior to a loop of bowel and safe access for percutaneous drainage is not possible. Partial small bowel obstruction pattern. The transition point is in the pelvis at the site of the inflammatory process. Cholelithiasis. Bilateral pleural effusions. Electronically  Signed   By: Marybelle Killings M.D.   On: 01/31/2019 11:51   Ct Abdomen Pelvis W Contrast  Result Date: 01/25/2019 CLINICAL DATA:  Diffuse abdominal pain. History of GERD, nausea. EXAM: CT ABDOMEN AND PELVIS WITH CONTRAST TECHNIQUE: Multidetector CT imaging of the abdomen and pelvis  was performed using the standard protocol following bolus administration of intravenous contrast. CONTRAST:  135mL OMNIPAQUE IOHEXOL 300 MG/ML  SOLN COMPARISON:  None. FINDINGS: Lower chest: Mild bibasilar scarring/atelectasis. Hepatobiliary: Numerous stones within the a otherwise unremarkable gallbladder. No focal liver abnormality. No significant bile duct dilatation seen. Pancreas: Unremarkable. No pancreatic ductal dilatation or surrounding inflammatory changes. Spleen: Normal in size without focal abnormality. Adrenals/Urinary Tract: Adrenal glands appear normal. Kidneys are unremarkable without mass, stone or hydronephrosis. No perinephric fluid. Air within the bladder. Stomach/Bowel: Extensive diverticulosis throughout the colon. Inflammation/fluid stranding is seen about the sigmoid colon indicating acute diverticulitis. The inflamed segment of bowel is contiguous with the anterior-superior margin of the bladder concerning for fistula. No dilated large or small bowel loops. Stomach is unremarkable, decompressed. Appendix is normal. Vascular/Lymphatic: Aortic atherosclerosis. No acute appearing vascular abnormality. Numerous small lymph nodes within the lower mesentery, likely reactive in nature. Reproductive: Presumed hysterectomy. No adnexal mass. Other: Small amount of free intraperitoneal air within the central abdomen and upper abdomen. No abscess collection seen. Musculoskeletal: No acute or suspicious osseous finding. IMPRESSION: 1. Diverticulitis of the sigmoid colon. The inflamed segment of sigmoid colon is contiguous with the anterior-superior margin of the bladder indicating colovesical fistula, with associated air in  the bladder. No circumscribed abscess collection identified. 2. Free intraperitoneal air within the abdomen and pelvis indicating associated bowel perforation, likely micro perforation given the small amount of free air. 3. Cholelithiasis without evidence of acute cholecystitis. These results were called by telephone at the time of interpretation on 01/25/2019 at 6:25 pm to Dr. Jola Schmidt , who verbally acknowledged these results. Aortic Atherosclerosis (ICD10-I70.0). Electronically Signed   By: Franki Cabot M.D.   On: 01/25/2019 18:27     Antimicrobials:   Flagyl x 4 days now off per gen surg  cipro day 5   Subjective: patient reports continued soft bowel movements, reports mild nausea no vomiting No other acute events overnight  Objective: Vitals:   02/17/19 1926 02/17/19 2038 02/17/19 2207 02/18/19 0420  BP: (!) 176/70 (!) 172/79 (!) 166/74 (!) 157/67  Pulse: 85   85  Resp: 17   17  Temp: 99.1 F (37.3 C)   98.5 F (36.9 C)  TempSrc: Oral   Oral  SpO2: 98%   99%  Weight:      Height:        Intake/Output Summary (Last 24 hours) at 02/18/2019 1530 Last data filed at 02/18/2019 1506 Gross per 24 hour  Intake 4108.55 ml  Output --  Net 4108.55 ml   Filed Weights   02/14/19 1748  Weight: 78.9 kg    Examination:  General exam: Appears calm and comfortable  Respiratory system: Clear to auscultation. Respiratory effort normal. Cardiovascular system: S1 & S2 heard, RRR. No JVD, murmurs, rubs, gallops or clicks. No pedal edema. Gastrointestinal system: Abdomen is nondistended, soft and mildly tenderLLQ.  Normal bowel sounds heard. Central nervous system: Alert and oriented. No focal neurological deficits. Extremities: WWP no edema. Skin: No rashes, lesions or ulcers Psychiatry: Judgement and insight appear normal. Mood & affect appropriate.     Data Reviewed: I have personally reviewed following labs and imaging studies  CBC: Recent Labs  Lab 02/14/19 1236  02/15/19 0222 02/16/19 0514 02/17/19 0222 02/18/19 0200  WBC 22.4* 16.0* 8.2 8.2 10.6*  NEUTROABS  --   --  5.8 5.7 8.2*  HGB 11.8* 9.3* 9.0* 8.7* 9.5*  HCT 36.8 28.8* 27.9* 27.2* 29.8*  MCV 85.8 85.2 85.6  85.0 83.7  PLT 576* 406* 365 346 086   Basic Metabolic Panel: Recent Labs  Lab 02/14/19 1236 02/15/19 0222 02/16/19 0514 02/17/19 0222 02/18/19 0200  NA 137 136 138 136 139  K 4.7 4.0 3.6 3.3* 3.0*  CL 104 106 107 105 106  CO2 21* 22 21* 21* 21*  GLUCOSE 114* 88 82 78 97  BUN 13 12 6* <5* <5*  CREATININE 0.86 0.70 0.73 0.67 0.64  CALCIUM 9.3 8.5* 8.4* 8.2* 8.4*   GFR: Estimated Creatinine Clearance: 61.6 mL/min (by C-G formula based on SCr of 0.64 mg/dL). Liver Function Tests: Recent Labs  Lab 02/14/19 1236  AST 45*  ALT 24  ALKPHOS 126  BILITOT 1.5*  PROT 7.0  ALBUMIN 3.1*   Recent Labs  Lab 02/14/19 1236  LIPASE 31   No results for input(s): AMMONIA in the last 168 hours. Coagulation Profile: No results for input(s): INR, PROTIME in the last 168 hours. Cardiac Enzymes: No results for input(s): CKTOTAL, CKMB, CKMBINDEX, TROPONINI in the last 168 hours. BNP (last 3 results) No results for input(s): PROBNP in the last 8760 hours. HbA1C: No results for input(s): HGBA1C in the last 72 hours. CBG: No results for input(s): GLUCAP in the last 168 hours. Lipid Profile: No results for input(s): CHOL, HDL, LDLCALC, TRIG, CHOLHDL, LDLDIRECT in the last 72 hours. Thyroid Function Tests: No results for input(s): TSH, T4TOTAL, FREET4, T3FREE, THYROIDAB in the last 72 hours. Anemia Panel: No results for input(s): VITAMINB12, FOLATE, FERRITIN, TIBC, IRON, RETICCTPCT in the last 72 hours. Sepsis Labs: No results for input(s): PROCALCITON, LATICACIDVEN in the last 168 hours.  Recent Results (from the past 240 hour(s))  Urine culture     Status: Abnormal   Collection Time: 02/14/19  3:21 PM  Result Value Ref Range Status   Specimen Description URINE, RANDOM   Final   Special Requests   Final    NONE Performed at [facility], [address] [place], [place] [address]    Culture 60,000 COLONIES/mL YEAST (A)  Final   Report Status 02/15/2019 FINAL  Final  Blood culture (routine x 2)     Status: None (Preliminary result)   Collection Time: 02/14/19  5:16 PM  Result Value Ref Range Status   Specimen Description BLOOD SITE NOT SPECIFIED  Final   Special Requests   Final    BOTTLES DRAWN AEROBIC AND ANAEROBIC Blood Culture results may not be optimal due to an inadequate volume of blood received in culture bottles   Culture   Final    NO GROWTH 4 DAYS Performed at [facility], [place] [address] [place], [place] [address]    Report Status PENDING  Incomplete  Blood culture (routine x 2)     Status: None (Preliminary result)   Collection Time: 02/14/19  5:35 PM  Result Value Ref Range Status   Specimen Description BLOOD RIGHT HAND  Final   Special Requests   Final    BOTTLES DRAWN AEROBIC AND ANAEROBIC Blood Culture results may not be optimal due to an inadequate volume of blood received in culture bottles   Culture   Final    NO GROWTH 4 DAYS Performed at [facility], [place] [address], [place], [place] [address]    Report Status PENDING  Incomplete         Radiology Studies: No results found.      Scheduled Meds:  aspirin EC  81 mg Oral QPM  calcium carbonate  1,250 mg Oral BID WC   ciprofloxacin  500 mg Oral BID   guaiFENesin  600 mg Oral BID   heparin  5,000 Units Subcutaneous Q8H   hydroxychloroquine  200 mg Oral BID   montelukast  10 mg Oral QHS    morphine injection  4 mg Intravenous Once   pantoprazole  40 mg Oral Daily   saccharomyces boulardii  250 mg Oral BID   sodium chloride flush  3 mL Intravenous Q12H   Continuous Infusions:  sodium chloride     sodium chloride Stopped (02/18/19 0739)   potassium chloride 10 mEq (02/18/19 1527)     LOS: 4 days    Time spent: 35  min    Nicolette Bang, MD [facility]  If 7PM-7AM, please contact night-coverage  02/18/2019, 3:30 PM

## 2019-02-18 NOTE — Progress Notes (Signed)
[facility] Progress Note     Subjective: CC-  Feeling about the same as yesterday. She reports some intermittent LLQ pain. Pain is not worse with PO intake, it is random. Persistent nausea which she still attributes to antibiotics. No emesis. Loose BM x2 this morning. Passing flatus. Tolerating fulls but does not have much of an appetite.  Objective: Vital signs in last 24 hours: Temp:  [98.5 F (36.9 C)-99.1 F (37.3 C)] 98.5 F (36.9 C) (05/05 0420) Pulse Rate:  [85] 85 (05/05 0420) Resp:  [17] 17 (05/05 0420) BP: (157-176)/(67-79) 157/67 (05/05 0420) SpO2:  [98 %-99 %] 99 % (05/05 0420) Last BM Date: 02/18/19  Intake/Output from previous day: 05/04 0701 - 05/05 0700 In: 4155.6 [P.O.:585; I.V.:1328.8; IV Piggyback:2241.7] Out: -  Intake/Output this shift: No intake/output data recorded.  PE: Gen:  Alert, NAD, pleasant HEENT: EOM's intact, pupils equal and round Pulm:  effort normal, CTAB Cardio: RRR Abd: Soft, ND, mild LLQ TTP without rebound or guarding, +BS, no HSM Ext:  Calves soft and nontender Psych: A&Ox3  Skin: no rashes noted, warm and dry   Lab Results:  Recent Labs    02/17/19 0222 02/18/19 0200  WBC 8.2 10.6*  HGB 8.7* 9.5*  HCT 27.2* 29.8*  PLT 346 384   BMET Recent Labs    02/17/19 0222 02/18/19 0200  NA 136 139  K 3.3* 3.0*  CL 105 106  CO2 21* 21*  GLUCOSE 78 97  BUN <5* <5*  CREATININE 0.67 0.64  CALCIUM 8.2* 8.4*   PT/INR No results for input(s): LABPROT, INR in the last 72 hours. CMP     Component Value Date/Time   NA 139 02/18/2019 0200   K 3.0 (L) 02/18/2019 0200   CL 106 02/18/2019 0200   CO2 21 (L) 02/18/2019 0200   GLUCOSE 97 02/18/2019 0200   BUN <5 (L) 02/18/2019 0200   CREATININE 0.64 02/18/2019 0200   CALCIUM 8.4 (L) 02/18/2019 0200   PROT 7.0 02/14/2019 1236   ALBUMIN 3.1 (L) 02/14/2019 1236   AST 45 (H) 02/14/2019 1236   ALT 24 02/14/2019 1236   ALKPHOS 126 02/14/2019 1236   BILITOT 1.5 (H)  02/14/2019 1236   GFRNONAA >60 02/18/2019 0200   GFRAA >60 02/18/2019 0200   Lipase     Component Value Date/Time   LIPASE 31 02/14/2019 1236       Studies/Results: No results found.  Anti-infectives: Anti-infectives (From admission, onward)   Start     Dose/Rate Route Frequency Ordered Stop   02/18/19 0915  ciprofloxacin (CIPRO) tablet 500 mg     500 mg Oral 2 times daily 02/18/19 0901     02/16/19 1000  hydroxychloroquine (PLAQUENIL) tablet 200 mg     200 mg Oral 2 times daily 02/16/19 0958     02/14/19 1800  ciprofloxacin (CIPRO) IVPB 400 mg  Status:  Discontinued     400 mg 200 mL/hr over 60 Minutes Intravenous Every 12 hours 02/14/19 1711 02/18/19 0901   02/14/19 1800  metroNIDAZOLE (FLAGYL) IVPB 500 mg  Status:  Discontinued     500 mg 100 mL/hr over 60 Minutes Intravenous Every 8 hours 02/14/19 1711 02/18/19 0901   02/14/19 1700  piperacillin-tazobactam (ZOSYN) IVPB 3.375 g  Status:  Discontinued     3.375 g 100 mL/hr over 30 Minutes Intravenous  Once 02/14/19 1652 02/14/19 1711       Assessment/Plan Asthma Sjogren's syndrome   Recurrent sigmoid Diverticulitis with microperforation Colovesical fistula -  discharged 4/25 on augmentin, readmitted 5/1 worsening abdominal pain - CT 5/1 showed some slight worsening of her diverticulitis with a small developing abscess   ID - cipro 5/1>>day#5, flagyl 5/1>>5/5 FEN - IVF, FLD VTE - SCDs, sq heparin Foley - none Follow up - Dr. Dema Severin if she improves  Plan - D/c flagyl and transition to oral cipro. Continue fulls. Repeat labs in AM. If improved may be ready for discharge tomorrow with OP f/u with Dr. Dema Severin, if not improving may need surgery this admission.   LOS: 4 days    Denise Payne , [facility] 02/18/2019, 9:01 AM Pager: [phone] Mon-Thurs 7:00 am-4:30 pm Fri 7:00 am -11:30 AM Sat-Sun 7:00 am-11:30 am

## 2019-02-19 LAB — CBC
HCT: 30.7 % — ABNORMAL LOW (ref 36.0–46.0)
Hemoglobin: 9.8 g/dL — ABNORMAL LOW (ref 12.0–15.0)
MCH: 26.7 pg (ref 26.0–34.0)
MCHC: 31.9 g/dL (ref 30.0–36.0)
MCV: 83.7 fL (ref 80.0–100.0)
Platelets: 354 10*3/uL (ref 150–400)
RBC: 3.67 MIL/uL — ABNORMAL LOW (ref 3.87–5.11)
RDW: 13.2 % (ref 11.5–15.5)
WBC: 9.3 10*3/uL (ref 4.0–10.5)
nRBC: 0 % (ref 0.0–0.2)

## 2019-02-19 LAB — BASIC METABOLIC PANEL
Anion gap: 6 (ref 5–15)
BUN: <5 mg/dL — ABNORMAL LOW (ref 8–23)
CO2: 23 mmol/L (ref 22–32)
Calcium: 8.4 mg/dL — ABNORMAL LOW (ref 8.9–10.3)
Chloride: 109 mmol/L (ref 98–111)
Creatinine, Ser: 0.7 mg/dL (ref 0.44–1.00)
GFR calc Af Amer: >60 mL/min (ref >60)
GFR calc non Af Amer: >60 mL/min (ref >60)
Glucose, Bld: 91 mg/dL (ref 70–99)
Potassium: 3.6 mmol/L (ref 3.5–5.1)
Sodium: 138 mmol/L (ref 135–145)

## 2019-02-19 LAB — CULTURE, BLOOD (ROUTINE X 2)
Culture: NO GROWTH
Culture: NO GROWTH

## 2019-02-19 LAB — MAGNESIUM: Magnesium: 1.4 mg/dL — ABNORMAL LOW (ref 1.7–2.4)

## 2019-02-19 MED ORDER — OXYCODONE HCL 5 MG PO TABS: 5.0000 mg | ORAL_TABLET | Freq: Every day | ORAL | Status: DC

## 2019-02-19 MED ORDER — CIPROFLOXACIN HCL 500 MG PO TABS: 500.0000 mg | ORAL_TABLET | Freq: Two times a day (BID) | ORAL | 0 refills | Status: AC

## 2019-02-19 NOTE — Discharge Instructions (Addendum)
if her symptoms return  to ED and we would plan for surgery sooner rather than later.   Low-Fiber Eating Plan Fiber is found in fruits, vegetables, whole grains, and beans. Eating a diet low in fiber helps to reduce how often you have bowel movements and how much you produce during a bowel movement. A low-fiber eating plan may help your digestive system heal if:  You have certain conditions, such as Crohn's disease or diverticulitis.  You recently had radiation therapy on your pelvis or bowel.  You recently had intestinal surgery.  You have a new surgical opening in your abdomen (colostomy or ileostomy).  Your intestine is narrowed (stricture). Your health care provider will determine how long you need to stay on this diet. Your health care provider may recommend that you work with a diet and nutrition specialist (dietitian). What are tips for following this plan? General guidelines  Follow recommendations from your dietitian about how much fiber you should have each day.  Most people on this eating plan should try to eat less than 10 grams (g) of fiber each day. Your daily fiber goal is _________________ g.  Take vitamin and mineral supplements as told by your health care provider or dietitian. Chewable or liquid forms are best when on this eating plan. Reading food labels  Check food labels for the amount of dietary fiber.  Choose foods that have less than 2 grams of fiber in one serving. Cooking  Use white flour and other allowed grains for baking and cooking.  Cook meat using methods that keep it tender, such as braising or poaching.  Cook eggs until the yolk is completely solid.  Cook with healthy oils, such as olive oil or canola oil. Meal planning   Eat 5-6 small meals throughout the day instead of 3 large meals.  If you are lactose intolerant: ? Choose low-lactose dairy foods. ? Do not eat dairy foods, if told by your dietitian.  Limit fat and oils to less than 8  teaspoons a day.  Eat small portions of desserts. What foods are allowed? The items listed below may not be a complete list. Talk with your dietitian about what dietary choices are best for you. Grains All bread and crackers made with white flour. Waffles, pancakes, and [place] toast. Bagels. Pretzels. Melba toast, zwieback, and matzoh. Cooked and dried cereals that do not contain whole grains, added fiber, seeds, or dried fruit. CornmealDomenick Gong. Hot and cold cereals made with refined corn, wheat, rice, or oats. Plain pasta and noodles. White rice. Vegetables Well-cooked or canned vegetables without skin, seeds, or stems. Cooked potatoes without skins. Vegetable juice. Fruits Soft-cooked or canned fruits without skin and seeds. Peeled ripe banana. Applesauce. Fruit juice without pulp. Meats and other protein foods Ground meat. Tender cuts of meat or poultry. Eggs. Fish, seafood, and shellfish. Smooth nut butters. Tofu. Dairy All milk products and drinks. Lactose-free milks, including rice, soy, and almond milks. Yogurt without fruit, nuts, chocolate, or granola mix-ins. Sour cream. Cottage cheese. Cheese. Beverages Decaf coffee. Fruit and vegetable juices or smoothies (in small amounts, with no pulp or skins, and with fruits from allowed list). Sports drinks. Herbal tea. Fats and oils Olive oil, canola oil, sunflower oil, flaxseed oil, and grapeseed oil. Mayonnaise. Cream cheese. Margarine. Butter. Sweets and desserts Plain cakes and cookies. Cream pies and pies made with allowed fruits. Pudding. Custard. Fruit gelatin. Sherbet. Popsicles. Ice cream without nuts. Plain hard candy. Honey. Jelly. Molasses. Syrups, including chocolate syrup.  Chocolate. Marshmallows. Gumdrops. Seasoning and other foods Bouillon. Broth. Cream soups made from allowed foods. Strained soup. Casseroles made with allowed foods. Ketchup. Mild mustard. Mild salad dressings. Plain gravies. Vinegar. Spices in moderation.  Salt. Sugar. What foods are not allowed? The items listed below may not be a complete list. Talk with your dietitian about what dietary choices are best for you. Grains Whole wheat and whole grain breads and crackers. Multigrain breads and crackers. Rye bread. Whole grain or multigrain cereals. Cereals with nuts, raisins, or coconut. Bran. Coarse wheat cereals. Granola. High-fiber cereals. Cornmeal or corn bread. Whole grain pasta. Wild or brown rice. Quinoa. Popcorn. Buckwheat. Wheat germ. Vegetables Potato skins. Raw or undercooked vegetables. All beans and bean sprouts. Cooked greens. Corn. Peas. Cabbage. Beets. Broccoli. Brussels sprouts. Cauliflower. Mushrooms. Onions. Peppers. Parsnips. Okra. Sauerkraut. Fruit Raw or dried fruit. Berries. Fruit juice with pulp. Prune juice. Meats and other protein foods Tough, fibrous meats with gristle. Fatty meat. Poultry with skin. Fried meat, Sales executive, or fish. Deli or lunch meats. Sausage, bacon, and hot dogs. Nuts and chunky nut butter. Dried peas, beans, and lentils. Dairy Yogurt with fruit, nuts, chocolate, or granola mix-ins. Beverages Caffeinated coffee and teas. Fats and oils Avocado. Coconut. Sweets and desserts Desserts, cookies, or candies that contain nuts or coconut. Dried fruit. Jams and preserves with seeds. Marmalade. Any dessert made with fruits or grains that are not allowed. Seasoning and other foods Corn tortilla chips. Soups made with vegetables or grains that are not allowed. Relish. Horseradish. Angie Fava. Olives. Summary  Most people on a low-fiber eating plan should eat less than 10 grams of fiber a day. Follow recommendations from your dietitian about how much fiber you should have each day.  Always check food labels to see the dietary fiber content of packaged foods. In general, a low-fiber food will have fewer than 2 grams of fiber per serving.  In general, try to avoid whole grains, raw fruits and vegetables, dried fruit,  tough cuts of meat, nuts, and seeds.  Take a vitamin and mineral supplement as told by your health care provider or dietitian. This information is not intended to replace advice given to you by your health care provider. Make sure you discuss any questions you have with your health care provider. Document Released: 03/24/2002 Document Revised: 12/05/2016 Document Reviewed: 12/05/2016 Elsevier Interactive Patient Education  2019 Elsevier Inc.    Diverticulitis  Diverticulitis is when small pockets in your large intestine (colon) get infected or swollen. This causes stomach pain and watery poop (diarrhea). These pouches are called diverticula. They form in people who have a condition called diverticulosis. Follow these instructions at home: Medicines  Take over-the-counter and prescription medicines only as told by your doctor. These include: ? Antibiotics. ? Pain medicines. ? Fiber pills. ? Probiotics. ? Stool softeners.  Do not drive or use heavy machinery while taking prescription pain medicine.  If you were prescribed an antibiotic, take it as told. Do not stop taking it even if you feel better. General instructions   Follow a diet as told by your doctor.  When you feel better, your doctor may tell you to change your diet. You may need to eat a lot of fiber. Fiber makes it easier to poop (have bowel movements). Healthy foods with fiber include: ? Berries. ? Beans. ? Lentils. ? Green vegetables.  Exercise 3 or more times a week. Aim for 30 minutes each time. Exercise enough to sweat and make your heart beat faster.  Keep all follow-up visits as told. This is important. You may need to have an exam of the large intestine. This is called a colonoscopy. Contact a doctor if:  Your pain does not get better.  You have a hard time eating or drinking.  You are not pooping like normal. Get help right away if:  Your pain gets worse.  Your problems do not get better.  Your  problems get worse very fast.  You have a fever.  You throw up (vomit) more than one time.  You have poop that is: ? Bloody. ? Black. ? Tarry. Summary  Diverticulitis is when small pockets in your large intestine (colon) get infected or swollen.  Take medicines only as told by your doctor.  Follow a diet as told by your doctor. This information is not intended to replace advice given to you by your health care provider. Make sure you discuss any questions you have with your health care provider. Document Released: 03/20/2008 Document Revised: 10/19/2016 Document Reviewed: 10/19/2016 Elsevier Interactive Patient Education  2019 [employer].

## 2019-02-19 NOTE — Progress Notes (Signed)
[facility] Progress Note     Subjective: CC-  Patient states that she's feeling better today than yesterday. Nausea has resolved. Tolerating diet although appetite still somewhat suppressed. She had a more formed BM this morning. States that she has had no more LLQ pains.  WBC 9.3, VSS  Objective: Vital signs in last 24 hours: Temp:  [98.3 F (36.8 C)-99 F (37.2 C)] 98.7 F (37.1 C) (05/06 0538) Pulse Rate:  [88-93] 92 (05/06 0538) Resp:  [18] 18 (05/06 0538) BP: (148-184)/(62-93) 152/62 (05/06 0538) SpO2:  [98 %-99 %] 98 % (05/06 0538) Last BM Date: 02/18/19  Intake/Output from previous day: 05/05 0701 - 05/06 0700 In: 199.5 [P.O.:150; I.V.:28; IV Piggyback:21.5] Out: -  Intake/Output this shift: No intake/output data recorded.  PE: Gen: Alert, NAD, pleasant HEENT: EOM's intact, pupils equal and round Pulm: effort normal, CTAB Cardio: RRR Abd: Soft, ND, NT, +BS, no HSM WYO:VZCHYI soft and nontender Psych: A&Ox3  Skin: no rashes noted, warm and dry    Lab Results:  Recent Labs    02/18/19 0200 02/19/19 0203  WBC 10.6* 9.3  HGB 9.5* 9.8*  HCT 29.8* 30.7*  PLT 384 354   BMET Recent Labs    02/18/19 0200 02/19/19 0203  NA 139 138  K 3.0* 3.6  CL 106 109  CO2 21* 23  GLUCOSE 97 91  BUN <5* <5*  CREATININE 0.64 0.70  CALCIUM 8.4* 8.4*   PT/INR No results for input(s): LABPROT, INR in the last 72 hours. CMP     Component Value Date/Time   NA 138 02/19/2019 0203   K 3.6 02/19/2019 0203   CL 109 02/19/2019 0203   CO2 23 02/19/2019 0203   GLUCOSE 91 02/19/2019 0203   BUN <5 (L) 02/19/2019 0203   CREATININE 0.70 02/19/2019 0203   CALCIUM 8.4 (L) 02/19/2019 0203   PROT 7.0 02/14/2019 1236   ALBUMIN 3.1 (L) 02/14/2019 1236   AST 45 (H) 02/14/2019 1236   ALT 24 02/14/2019 1236   ALKPHOS 126 02/14/2019 1236   BILITOT 1.5 (H) 02/14/2019 1236   GFRNONAA >60 02/19/2019 0203   GFRAA >60 02/19/2019 0203   Lipase     Component  Value Date/Time   LIPASE 31 02/14/2019 1236       Studies/Results: No results found.  Anti-infectives: Anti-infectives (From admission, onward)   Start     Dose/Rate Route Frequency Ordered Stop   02/19/19 0000  ciprofloxacin (CIPRO) 500 MG tablet     500 mg Oral 2 times daily 02/19/19 0903 02/26/19 2359   02/18/19 2000  ciprofloxacin (CIPRO) tablet 500 mg     500 mg Oral 2 times daily 02/18/19 0901     02/16/19 1000  hydroxychloroquine (PLAQUENIL) tablet 200 mg     200 mg Oral 2 times daily 02/16/19 0958     02/14/19 1800  ciprofloxacin (CIPRO) IVPB 400 mg  Status:  Discontinued     400 mg 200 mL/hr over 60 Minutes Intravenous Every 12 hours 02/14/19 1711 02/18/19 0901   02/14/19 1800  metroNIDAZOLE (FLAGYL) IVPB 500 mg  Status:  Discontinued     500 mg 100 mL/hr over 60 Minutes Intravenous Every 8 hours 02/14/19 1711 02/18/19 0901   02/14/19 1700  piperacillin-tazobactam (ZOSYN) IVPB 3.375 g  Status:  Discontinued     3.375 g 100 mL/hr over 30 Minutes Intravenous  Once 02/14/19 1652 02/14/19 1711       Assessment/Plan Asthma Sjogren's syndrome   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 Dema Severin   Plan- Clinically improving today, WBC down 9.3, afebrile. Patient would like to continue nonop treatment at this time, and keep appt with Dr. Dema Severin in 3 weeks to discuss elective surgery.  She understands that if her symptoms return she will need to return to ED and we would plan for surgery sooner rather than later.  [place] for discharge from surgical standpoint. I sent a rx for cipro x7 days to her pharmacy. She knows to call with questions/concerns. I also discussed this  with her daughter on the phone.   LOS: 5 days    Wellington Hampshire , [facility] 02/19/2019, 9:04 AM Pager: [phone] Mon-Thurs 7:00 am-4:30 pm Fri 7:00 am -11:30 AM Sat-Sun 7:00 am-11:30 am

## 2019-02-19 NOTE — Progress Notes (Signed)
Patient discharging home. Discharge instructions explained to patient and she verbalized understanding. No c/o pain or discomfort upon discharge. Took all personal belongings. No further questions or concerns voiced.

## 2019-02-19 NOTE — Discharge Summary (Signed)
Physician Discharge Summary  [name] QTM:[id number] DOB: 1946-10-02 DOA: 02/14/2019  PCP: Leeroy Cha, MD  Admit date: 02/14/2019 Discharge date: 02/19/2019  Admitted From: home Discharge disposition: home   Recommendations for Outpatient Follow-Up:   1. Low residue diet 2. Close outpatient surgical follow up 3. Monitor blood pressure-- start medications if needed outpatient   Discharge Diagnosis:   Principal Problem:   Acute sigmoid diverticulitis with microperforation and colovesical fistula/Colonic diverticular abscess Active Problems:   Diverticulitis of large intestine with perforation   Asthma   Sjogren's syndrome ([place])    Discharge Condition: Improved.  Diet recommendation: Low sodium, heart healthy.  Carbohydrate-modified.  Regular.  Wound care: None.  Code status: Full.   History of Present Illness:    [name] a47 y.o.femaleretired RN with a history of persistent asthma and Sjogren's syndrome on plaquenilpresents to the ED with abdominal pain and persistent emesis without blood but with occasional bile.Marland KitchenMarland KitchenMarland KitchenNo fevers no chills Patient was admitted on 01/25/2019 with acute sigmoid diverticulitis with microperforation and colovesical fistula----treated with IV Zosyn and discharged home on Augmentin on 02/08/2019-----  Hospital Course by Problem:  Recurrent sigmoid Diverticulitis with microperforation Colovesical fistula Per general surgery: Clinically improving today, WBC down 9.3, afebrile. Patient would like to continue nonop treatment at this time, and keep appt with Dr. Dema Severin in 3 weeks to discuss elective surgery.  She understands that if her symptoms return she will need to return to ED and we would plan for surgery sooner rather than later.  [place] for discharge from surgical standpoint. I sent a rx for cipro x7 days to her pharmacy. She knows to call with questions/concerns. I also discussed this with her daughter on the  phone.  Asthma. Patient is not in exacerbation she stable continue Singulair and as needed nebulizers.  Sjogren's syndrome.  - on Plaquenil  Normocytic anemia.  -Patient mildly iron deficient. We will continue home medications is no evidence of ongoing blood loss -outpatient follow up  Hypertension.  -outpatient follow up  Medical Consultants:      Discharge Exam:   Vitals:   02/19/19 0213 02/19/19 0538  BP: (!) 148/65 (!) 152/62  Pulse:  92  Resp:  18  Temp:  98.7 F (37.1 C)  SpO2:  98%   Vitals:   02/18/19 1601 02/18/19 2251 02/19/19 0213 02/19/19 0538  BP: (!) 158/72 (!) 184/93 (!) 148/65 (!) 152/62  Pulse: 88 93  92  Resp:  18  18  Temp: 99 F (37.2 C) 98.3 F (36.8 C)  98.7 F (37.1 C)  TempSrc: Oral Oral  Oral  SpO2: 99% 99%  98%  Weight:      Height:        General exam: Appears calm and comfortable.   The results of significant diagnostics from this hospitalization (including imaging, microbiology, ancillary and laboratory) are listed below for reference.     Procedures and Diagnostic Studies:   Ct Abdomen Pelvis W Contrast  Result Date: 02/14/2019 CLINICAL DATA:  Patient was recently admitted for diverticulitis with continue abdomen pain. EXAM: CT ABDOMEN AND PELVIS WITH CONTRAST TECHNIQUE: Multidetector CT imaging of the abdomen and pelvis was performed using the standard protocol following bolus administration of intravenous contrast. CONTRAST:  150mL OMNIPAQUE IOHEXOL 300 MG/ML  SOLN COMPARISON:  February 07, 2019. FINDINGS: Lower chest: Minimal atelectasis of the lung bases are noted. The heart size is normal. Hepatobiliary: No focal liver abnormality is seen. Gallstones are identified in the gallbladder. No inflammatory  changes noted around the gallbladder. No biliary dilatation. Pancreas: Unremarkable. No pancreatic ductal dilatation or surrounding inflammatory changes. Spleen: Normal in size without focal abnormality. Adrenals/Urinary Tract:  Adrenal glands are unremarkable. Kidneys are normal, without renal calculi, focal lesion, or hydronephrosis. The previously noted enhancing fluid collection at the dome of the bladder is stable in size but the collection currently has air suggesting focal abscess. Stomach/Bowel: Inflammatory changes surrounding the proximal sigmoid colon is slightly worsened. There are multiple moderate distended small bowel loops in the upper and mid abdomen with transition in the lower mid abdomen at the site of mesentery inflammation of nearby sigmoid colon suggesting possible developing small bowel obstruction. The stomach is normal.  The appendix is normal. Vascular/Lymphatic: Aortic atherosclerosis. No enlarged abdominal or pelvic lymph nodes. Reproductive: Uterus and bilateral adnexa are unremarkable. Other: The previously seen small abscesses in lower abdomen are probably not significantly changed in size but there is current air formation within the abscess not previously seen. Musculoskeletal: Degenerative joint changes of the spine are identified. IMPRESSION: Inflammatory changes surrounding the proximal sigmoid colon is slightly worsened compared to prior exam with worsened interval question developing small bowel obstruction as described. The previously noted small abscesses in the lower abdomen probably not significantly changed in size but there is current air formation noted within the abscesses not previously seen. Electronically Signed   By: Abelardo Diesel M.D.   On: 02/14/2019 15:57     Labs:   Basic Metabolic Panel: Recent Labs  Lab 02/15/19 0222 02/16/19 0514 02/17/19 0222 02/18/19 0200 02/19/19 0203  NA 136 138 136 139 138  K 4.0 3.6 3.3* 3.0* 3.6  CL 106 107 105 106 109  CO2 22 21* 21* 21* 23  GLUCOSE 88 82 78 97 91  BUN 12 6* <5* <5* <5*  CREATININE 0.70 0.73 0.67 0.64 0.70  CALCIUM 8.5* 8.4* 8.2* 8.4* 8.4*  MG  --   --   --   --  1.4*   GFR Estimated Creatinine Clearance: 61.6 mL/min  (by C-G formula based on SCr of 0.7 mg/dL). Liver Function Tests: Recent Labs  Lab 02/14/19 1236  AST 45*  ALT 24  ALKPHOS 126  BILITOT 1.5*  PROT 7.0  ALBUMIN 3.1*   Recent Labs  Lab 02/14/19 1236  LIPASE 31   No results for input(s): AMMONIA in the last 168 hours. Coagulation profile No results for input(s): INR, PROTIME in the last 168 hours.  CBC: Recent Labs  Lab 02/15/19 0222 02/16/19 0514 02/17/19 0222 02/18/19 0200 02/19/19 0203  WBC 16.0* 8.2 8.2 10.6* 9.3  NEUTROABS  --  5.8 5.7 8.2*  --   HGB 9.3* 9.0* 8.7* 9.5* 9.8*  HCT 28.8* 27.9* 27.2* 29.8* 30.7*  MCV 85.2 85.6 85.0 83.7 83.7  PLT 406* 365 346 384 354   Cardiac Enzymes: No results for input(s): CKTOTAL, CKMB, CKMBINDEX, TROPONINI in the last 168 hours. BNP: Invalid input(s): POCBNP CBG: No results for input(s): GLUCAP in the last 168 hours. D-Dimer No results for input(s): DDIMER in the last 72 hours. Hgb A1c No results for input(s): HGBA1C in the last 72 hours. Lipid Profile No results for input(s): CHOL, HDL, LDLCALC, TRIG, CHOLHDL, LDLDIRECT in the last 72 hours. Thyroid function studies No results for input(s): TSH, T4TOTAL, T3FREE, THYROIDAB in the last 72 hours.  Invalid input(s): FREET3 Anemia work up No results for input(s): VITAMINB12, FOLATE, FERRITIN, TIBC, IRON, RETICCTPCT in the last 72 hours. Microbiology Recent Results (from the past 240  hour(s))  Urine culture     Status: Abnormal   Collection Time: 02/14/19  3:21 PM  Result Value Ref Range Status   Specimen Description URINE, RANDOM  Final   Special Requests   Final    NONE Performed at [facility], [address], [place], [place] [address]    Culture 60,000 COLONIES/mL YEAST (A)  Final   Report Status 02/15/2019 FINAL  Final  Blood culture (routine x 2)     Status: None   Collection Time: 02/14/19  5:16 PM  Result Value Ref Range Status   Specimen Description BLOOD SITE NOT SPECIFIED  Final   Special  Requests   Final    BOTTLES DRAWN AEROBIC AND ANAEROBIC Blood Culture results may not be optimal due to an inadequate volume of blood received in culture bottles   Culture   Final    NO GROWTH 5 DAYS Performed at [facility], [place] [address] [place], [place] [address]    Report Status 02/19/2019 FINAL  Final  Blood culture (routine x 2)     Status: None   Collection Time: 02/14/19  5:35 PM  Result Value Ref Range Status   Specimen Description BLOOD RIGHT HAND  Final   Special Requests   Final    BOTTLES DRAWN AEROBIC AND ANAEROBIC Blood Culture results may not be optimal due to an inadequate volume of blood received in culture bottles   Culture   Final    NO GROWTH 5 DAYS Performed at [facility], [place] [address], [place], [place] [address]    Report Status 02/19/2019 FINAL  Final     Discharge Instructions:   Discharge Instructions    Discharge instructions   Complete by:  As directed    Low residue   Increase activity slowly   Complete by:  As directed      Allergies as of 02/19/2019      Reactions   Latex Shortness Of Breath, Swelling   Lip and eye swelling   Onion Diarrhea, Other (See Comments)   Stomach cramps      Medication List    STOP taking these medications   naproxen sodium 220 MG tablet Commonly known as:  ALEVE     TAKE these medications   albuterol 108 (90 Base) MCG/ACT inhaler Commonly known as:  VENTOLIN HFA Inhale 2 puffs into the lungs every 6 (six) hours as needed for wheezing or shortness of breath.   aspirin EC 81 MG tablet Take 81 mg by mouth every evening.   ciprofloxacin 500 MG tablet Commonly known as:  CIPRO Take 1 tablet (500 mg total) by mouth 2 (two) times daily for 7 days.   cyclobenzaprine 10 MG tablet Commonly known as:  FLEXERIL Take 10 mg by mouth daily as needed for muscle spasms.   diclofenac sodium 1 % Gel Commonly known as:  VOLTAREN Apply 2 g topically 2 (two) times daily as needed (pain).     esomeprazole 40 MG capsule Commonly known as:  NEXIUM Take 40 mg by mouth daily.   hydroxychloroquine 200 MG tablet Commonly known as:  PLAQUENIL Take 200 mg by mouth 2 (two) times daily.   Melatonin 10 MG Tabs Take 10 mg by mouth at bedtime as needed (sleep).   montelukast 10 MG tablet Commonly known as:  SINGULAIR Take 10 mg by mouth at bedtime.   oxyCODONE 5 MG immediate release tablet Commonly known as:  Oxy IR/ROXICODONE Take 1 tablet (  5 mg total) by mouth at bedtime.   PROBIOTIC PO Take 1 tablet by mouth daily.   vitamin B-12 1000 MCG tablet Commonly known as:  CYANOCOBALAMIN Take 1,000 mcg by mouth daily.   Vitamin D 50 MCG (2000 UT) tablet Take 2,000 Units by mouth daily.      Follow-up Information    Ileana Roup, MD Follow up.   Specialty:  General Surgery Why:  follow up as scheduled in 3 weeks Contact information: [place] [address] [phone]        Leeroy Cha, MD Follow up in 1 week(s).   Specialty:  Internal Medicine Contact information: [address] [place] [address] [phone]            Time coordinating discharge: 35 min  Signed:  Geradine Girt DO  [facility] 02/19/2019, 9:59 AM

## 2019-02-28 DIAGNOSIS — K219 Gastro-esophageal reflux disease without esophagitis: Secondary | ICD-10-CM | POA: Diagnosis not present

## 2019-02-28 DIAGNOSIS — D649 Anemia, unspecified: Secondary | ICD-10-CM | POA: Diagnosis not present

## 2019-02-28 DIAGNOSIS — M3500 Sicca syndrome, unspecified: Secondary | ICD-10-CM | POA: Diagnosis not present

## 2019-02-28 DIAGNOSIS — J4520 Mild intermittent asthma, uncomplicated: Secondary | ICD-10-CM | POA: Diagnosis not present

## 2019-02-28 DIAGNOSIS — K5720 Diverticulitis of large intestine with perforation and abscess without bleeding: Secondary | ICD-10-CM | POA: Diagnosis not present

## 2019-03-11 DIAGNOSIS — K5732 Diverticulitis of large intestine without perforation or abscess without bleeding: Secondary | ICD-10-CM | POA: Diagnosis not present

## 2019-03-26 ENCOUNTER — Other Ambulatory Visit: Payer: Self-pay | Admitting: Urology

## 2019-03-27 DIAGNOSIS — R109 Unspecified abdominal pain: Secondary | ICD-10-CM | POA: Diagnosis not present

## 2019-04-01 ENCOUNTER — Ambulatory Visit: Payer: Self-pay | Admitting: Surgery

## 2019-04-01 NOTE — H&P (Signed)
CC: Hospital f/u - hx of sigmoid diverticulitis, probable colovesical fistula, possible small bowel involvement  HPI: Ms. Barbe is a very pleasant 84yoF with hx of Sjogren syndrome (managed with hydroxychloroquine, no steroids), asthma who presented to the hospital for/09/2019 with abdominal pain. She is worked up and found to have a leukocytosis and abdominal tenderness on exam. She underwent CT scan of the abdomen and pelvis which showed sigmoid diverticulitis with microperforation and a few clips of gas in the mesentery, air and the dome of the bladder with suspicion for colovesical fistula. She at that time and noted having at least a year worth of symptoms regarding pneumaturia. She hadn't had any prior known history of diverticulitis. She is admitted hospital she had a relatively protracted course and was discharged on 02/08/2019 on oral antibiotics. She had bloating and nausea and was readmitted 02/14/2019 at which time she had a CT scan which demonstrated inflammatory changes around the proximal sigmoid colon which was slightly worsened in appearance as well as a reactive ileus/partial small bowel traction with some secondarily inflamed/enteritis appearing small intestine on her scan. She was placed on Cipro Flagyl and admitted hospital. With this regimen, she improved. She was discharged 02/19/2019 and is here today for follow-up. She denies any significant complaints at this time.  She denies any nausea/vomiting. She is having gas and bowel movements. She is tolerating a diet-low fiber at this time. She has been eating carrots that are cooked.  She reports having had her last colonoscopy 2 years ago in [facility]. We do not have a copy of this this time. She reports that this was normal as far she can recall.  PMH: Sjogren syndrome (well controlled with hydroxychloroquine, denies steroids); asthma and (well controlled with when necessary inhalers)  PSH: Hysterectomy via  Pfannenstiel. Denies any other abdominal or pelvic procedures.  FHx: Denies FHx of malignancy  Social: Denies use of tobacco/drugs; occasional social EtOH use - not daily. None since last admission. She is a retired Therapist, sports  ROS: A comprehensive 10 system review of systems was completed with the patient and pertinent findings as noted above.  The patient is a 73 year old female.   Allergies (Tanisha A. Owens Shark, [place]; 03/11/2019 11:30 AM) No Known Drug Allergies  [03/11/2019]: Allergies Reconciled   Medication History (Tanisha A. Owens Shark, [place]; 03/11/2019 11:32 AM) Cyclobenzaprine HCl (10MG  Tablet, Oral as needed) Active. Montelukast Sodium (10MG  Tablet, Oral) Active. Aspirin (81MG  Tablet, Oral) Active. Melatonin (Oral) Specific strength unknown - Active. NexIUM (40MG  Packet, Oral) Active. Plaquenil (200MG  Tablet, Oral) Active. [employer] Vitamin B12 (1000MCG Tablet, Oral) Active. Vitamin D2 (50 MCG(2000 UT) Tablet, Oral) Active. Voltaren (1% Gel, Transdermal) Active. Medications Reconciled    Review of Systems Harrell Gave M. Brentlee Sciara MD; 03/11/2019 12:00 PM) General Not Present- Chills and Fever. HEENT Not Present- Double Vision and Headache. Respiratory Not Present- Cough and Difficulty Breathing on Exertion. Cardiovascular Not Present- Chest Pain and Difficulty Breathing On Exertion. Gastrointestinal Not Present- Abdominal Pain, Nausea and Vomiting. Musculoskeletal Present- Muscle Pain. Not Present- Decreased Range of Motion. Neurological Not Present- Decreased Memory and Difficulty Speaking. Psychiatric Not Present- Anxiety and Depression. Hematology Not Present- Blood Clots and Blood Thinners.  Vitals (Tanisha A. Brown RMA; 03/11/2019 11:30 AM) 03/11/2019 11:29 AM Weight: 166.2 lb Height: 62in Body Surface Area: 1.77 m Body Mass Index: 30.4 kg/m  Temp.: 97.8F  Pulse: 107 (Regular)  BP: 138/84(Sitting, Left Arm, Standard)       Physical Exam Harrell Gave M.  Emanuel Dowson MD; 03/11/2019  12:00 PM) The physical exam findings are as follows: Note: Constitutional: No acute distress; conversant; no deformities Eyes: Moist conjunctiva; no lid lag; anicteric sclerae; pupils equal round and reactive to light Neck: Trachea midline; no palpable thyromegaly Lungs: Normal respiratory effort; no tactile fremitus CV: Regular rate and rhythm; no palpable thrill; no pitting edema GI: Abdomen soft, nontender, nondistended; no palpable hepatosplenomegaly MSK: Normal gait; no clubbing/cyanosis Psychiatric: Appropriate affect; alert and oriented 3 Lymphatic: No palpable cervical or axillary lymphadenopathy    Assessment & Plan Harrell Gave M. Destyni Hoppel MD; 03/11/2019 12:05 PM)  SIGMOID DIVERTICULITIS (K57.32) Story: Ms. 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 The pathophysiology of diverticulitis and colovesical fistulas was discussed at length with associated pictures. -We discussed robotic/laparoscopic and possible open sigmoid colectomy, possible repair of colovesical fistula, possible small bowel resection, flexible sigmoidoscopy; cysto/stents with urology -The planned procedures, material risks (including, but not limited to, pain, bleeding, infection, scarring, need for blood transfusion, damage to surrounding structures- blood vessels/nerves/viscus/organs, damage to ureter, urine leak, leak from anastomosis, need for additional procedures, DVT/PE, need for stoma which may be permanent, hernia, recurrence of ~3%, pneumonia, heart attack, stroke, death) benefits and alternatives to surgery were discussed at length. I  noted a good probability that the procedure would help improve her symptoms through risk reduction of recurrence. The patient's questions were answered to their satisfaction, they voiced understanding and elected to proceed with surgery. Additionally, we discussed typical postoperative expectations and the recovery process. Counseling and coordination of care exceeded more than 50% of the time spent with patient. Total time spent with patient and charting: 39 minutes  Signed electronically by Ileana Roup, MD (03/11/2019 12:05 PM)

## 2019-04-10 DIAGNOSIS — Z6829 Body mass index (BMI) 29.0-29.9, adult: Secondary | ICD-10-CM | POA: Diagnosis not present

## 2019-04-10 DIAGNOSIS — M3500 Sicca syndrome, unspecified: Secondary | ICD-10-CM | POA: Diagnosis not present

## 2019-04-10 DIAGNOSIS — M5136 Other intervertebral disc degeneration, lumbar region: Secondary | ICD-10-CM | POA: Diagnosis not present

## 2019-04-10 DIAGNOSIS — M25561 Pain in right knee: Secondary | ICD-10-CM | POA: Diagnosis not present

## 2019-04-10 DIAGNOSIS — M150 Primary generalized (osteo)arthritis: Secondary | ICD-10-CM | POA: Diagnosis not present

## 2019-05-16 NOTE — Patient Instructions (Addendum)
YOU NEED TO HAVE A COVID 19 TEST ON 05/19/2019 @ 2:35 pm. THIS TEST MUST BE DONE BEFORE SURGERY, COME to  [facility], [place] , [address]. (This is the old [facility]) [place], PLEASE BEGIN THE QUARANTINE INSTRUCTIONS AS OUTLINED IN YOUR HANDOUT.                Denise Payne  05/16/2019   Your procedure is scheduled on: Thursday 05/22/2019   Report to [facility] Main  Entrance    Report to admitting at 5:45 am    [address] [place].   PLEASE CONSUME A CLEAR LIQUID DIET THE DAY BEFORE ALONG WITH YOUR BOWEL PREP   Call this number if you have problems the morning of surgery [phone]    Remember: DRINK 2 PRESURGERY ENSURE DRINKS THE NIGHT BEFORE SURGERY AT  1000 PM AND 1 PRESURGERY DRINK THE DAY OF THE PROCEDURE 3 HOURS PRIOR TO SCHEDULED SURGERY. NO SOLIDS AFTER MIDNIGHT THE DAY PRIOR TO THE SURGERY. NOTHING BY MOUTH EXCEPT CLEAR LIQUIDS UNTIL THREE HOURS PRIOR TO SCHEDULED SURGERY. PLEASE FINISH PRESURGERY ENSURE DRINK PER SURGEON ORDER 3 HOURS PRIOR TO SCHEDULED SURGERY TIME WHICH NEEDS TO BE COMPLETED AT 4:45 AM.    CLEAR LIQUID DIET   Foods Allowed                                                                     Foods Excluded  Coffee and tea, regular and decaf                             liquids that you cannot  Plain Jell-O any favor except red or purple                                           see through such as: Fruit ices (not with fruit pulp)                                     milk, soups, orange juice  Iced Popsicles                                    All solid food Carbonated beverages, regular and diet                                    Cranberry, grape and apple juices Sports drinks like Gatorade Lightly seasoned clear broth or consume(fat free) Sugar, honey syrup  Sample Menu Breakfast                                Lunch  Supper Cranberry juice                    Beef broth                            Chicken broth Jell-O                                     Grape juice                           Apple juice Coffee or tea                        Jell-O                                      Popsicle                                                Coffee or tea                        Coffee or tea  _____________________________________________________________________    Take these medicines the morning of surgery with A SIP OF WATER:  May use your albuterol inhaler if needed (please bring with you day of surgery)       BRUSH YOUR TEETH MORNING OF SURGERY AND RINSE YOUR MOUTH OUT, NO CHEWING GUM CANDY OR MINTS.                                   You may not have any metal on your body including hair pins and              piercings    Do not wear jewelry, make-up, lotions, powders or perfumes, deodorant             Do not wear nail polish.  Do not shave  48 hours prior to surgery.           Do not bring valuables to the hospital. [facility] [place].  Contacts, dentures or bridgework may not be worn into surgery.   [facility] - Preparing for Surgery Before surgery, you can play an important role.  Because skin is not sterile, your skin needs to be as free of germs as possible.  You can reduce the number of germs on your skin by washing with CHG (chlorahexidine gluconate) soap before surgery.  CHG is an antiseptic cleaner which kills germs and bonds with the skin to continue killing germs even after washing. Please DO NOT use if you have an allergy to CHG or antibacterial soaps.  If your skin becomes reddened/irritated stop using the CHG and inform your nurse when you arrive at Short Stay. Do not shave (including legs and underarms) for at least 48 hours prior to the first CHG shower.  You may shave your face/neck. Please follow  these instructions carefully:  1.  Shower  with CHG Soap the night before surgery and the  morning of Surgery.  2.  If you choose to wash your hair, wash your hair first as usual with your  normal  shampoo.  3.  After you shampoo, rinse your hair and body thoroughly to remove the  shampoo.                           4.  Use CHG as you would any other liquid soap.  You can apply chg directly  to the skin and wash                       Gently with a scrungie or clean washcloth.  5.  Apply the CHG Soap to your body ONLY FROM THE NECK DOWN.   Do not use on face/ open                           Wound or open sores. Avoid contact with eyes, ears mouth and genitals (private parts).                       Wash face,  Genitals (private parts) with your normal soap.             6.  Wash thoroughly, paying special attention to the area where your surgery  will be performed.  7.  Thoroughly rinse your body with warm water from the neck down.  8.  DO NOT shower/wash with your normal soap after using and rinsing off  the CHG Soap.                9.  Pat yourself dry with a clean towel.            10.  Wear clean pajamas.            11.  Place clean sheets on your bed the night of your first shower and do not  sleep with pets. Day of Surgery : Do not apply any lotions/deodorants the morning of surgery.  Please wear clean clothes to the hospital/surgery center.  FAILURE TO FOLLOW THESE INSTRUCTIONS MAY RESULT IN THE CANCELLATION OF YOUR SURGERY PATIENT SIGNATURE_________________________________  NURSE SIGNATURE__________________________________  ________________________________________________________________________   Denise Payne  An incentive spirometer is a tool that can help keep your lungs clear and active. This tool measures how well you are filling your lungs with each breath. Taking long deep breaths may help reverse or decrease the chance of developing breathing (pulmonary) problems (especially infection) following:  A long period  of time when you are unable to move or be active. BEFORE THE PROCEDURE   If the spirometer includes an indicator to show your best effort, your nurse or respiratory therapist will set it to a desired goal.  If possible, sit up straight or lean slightly forward. Try not to slouch.  Hold the incentive spirometer in an upright position. INSTRUCTIONS FOR USE  1. Sit on the edge of your bed if possible, or sit up as far as you can in bed or on a chair. 2. Hold the incentive spirometer in an upright position. 3. Breathe out normally. 4. Place the mouthpiece in your mouth and seal your lips tightly around it. 5. Breathe in slowly and as deeply as possible, raising the piston or the ball toward  the top of the column. 6. Hold your breath for 3-5 seconds or for as long as possible. Allow the piston or ball to fall to the bottom of the column. 7. Remove the mouthpiece from your mouth and breathe out normally. 8. Rest for a few seconds and repeat Steps 1 through 7 at least 10 times every 1-2 hours when you are awake. Take your time and take a few normal breaths between deep breaths. 9. The spirometer may include an indicator to show your best effort. Use the indicator as a goal to work toward during each repetition. 10. After each set of 10 deep breaths, practice coughing to be sure your lungs are clear. If you have an incision (the cut made at the time of surgery), support your incision when coughing by placing a pillow or rolled up towels firmly against it. Once you are able to get out of bed, walk around indoors and cough well. You may stop using the incentive spirometer when instructed by your caregiver.  RISKS AND COMPLICATIONS  Take your time so you do not get dizzy or light-headed.  If you are in pain, you may need to take or ask for pain medication before doing incentive spirometry. It is harder to take a deep breath if you are having pain. AFTER USE  Rest and breathe slowly and easily.  It  can be helpful to keep track of a log of your progress. Your caregiver can provide you with a simple table to help with this. If you are using the spirometer at home, follow these instructions: [place] IF:   You are having difficultly using the spirometer.  You have trouble using the spirometer as often as instructed.  Your pain medication is not giving enough relief while using the spirometer.  You develop fever of 100.5 F (38.1 C) or higher. SEEK IMMEDIATE MEDICAL CARE IF:   You cough up bloody sputum that had not been present before.  You develop fever of 102 F (38.9 C) or greater.  You develop worsening pain at or near the incision site. MAKE SURE YOU:   Understand these instructions.  Will watch your condition.  Will get help right away if you are not doing well or get worse. Document Released: 02/12/2007 Document Revised: 12/25/2011 Document Reviewed: 04/15/2007 [facility] Patient Information 2014 [place], [place].   ________________________________________________________________________

## 2019-05-19 ENCOUNTER — Other Ambulatory Visit (HOSPITAL_COMMUNITY)
Admission: RE | Admit: 2019-05-19 | Discharge: 2019-05-19 | Disposition: A | Discharge status: Home / Self Care | Payer: Medicare Other | Source: Ambulatory Visit | Attending: Surgery | Admitting: Surgery

## 2019-05-19 DIAGNOSIS — Z20828 Contact with and (suspected) exposure to other viral communicable diseases: Secondary | ICD-10-CM | POA: Diagnosis not present

## 2019-05-19 DIAGNOSIS — Z01812 Encounter for preprocedural laboratory examination: Secondary | ICD-10-CM | POA: Diagnosis not present

## 2019-05-19 LAB — SARS CORONAVIRUS 2 (TAT 6-24 HRS): SARS Coronavirus 2: NEGATIVE

## 2019-05-20 ENCOUNTER — Encounter (HOSPITAL_COMMUNITY)
Admission: RE | Admit: 2019-05-20 | Discharge: 2019-05-20 | Disposition: A | Discharge status: Home / Self Care | Payer: Medicare Other | Source: Ambulatory Visit | Attending: Surgery | Admitting: Surgery

## 2019-05-20 ENCOUNTER — Encounter (HOSPITAL_COMMUNITY): Payer: Self-pay | Admitting: *Deleted

## 2019-05-20 ENCOUNTER — Other Ambulatory Visit: Payer: Self-pay

## 2019-05-20 DIAGNOSIS — K5792 Diverticulitis of intestine, part unspecified, without perforation or abscess without bleeding: Secondary | ICD-10-CM | POA: Insufficient documentation

## 2019-05-20 DIAGNOSIS — Z91018 Allergy to other foods: Secondary | ICD-10-CM | POA: Diagnosis not present

## 2019-05-20 DIAGNOSIS — J45909 Unspecified asthma, uncomplicated: Secondary | ICD-10-CM | POA: Diagnosis not present

## 2019-05-20 DIAGNOSIS — Z885 Allergy status to narcotic agent status: Secondary | ICD-10-CM | POA: Diagnosis not present

## 2019-05-20 DIAGNOSIS — Z90710 Acquired absence of both cervix and uterus: Secondary | ICD-10-CM | POA: Diagnosis not present

## 2019-05-20 DIAGNOSIS — N321 Vesicointestinal fistula: Secondary | ICD-10-CM | POA: Insufficient documentation

## 2019-05-20 DIAGNOSIS — Z803 Family history of malignant neoplasm of breast: Secondary | ICD-10-CM | POA: Diagnosis not present

## 2019-05-20 DIAGNOSIS — K5720 Diverticulitis of large intestine with perforation and abscess without bleeding: Secondary | ICD-10-CM | POA: Diagnosis not present

## 2019-05-20 DIAGNOSIS — F419 Anxiety disorder, unspecified: Secondary | ICD-10-CM | POA: Diagnosis not present

## 2019-05-20 DIAGNOSIS — E669 Obesity, unspecified: Secondary | ICD-10-CM | POA: Diagnosis not present

## 2019-05-20 DIAGNOSIS — R9431 Abnormal electrocardiogram [ECG] [EKG]: Secondary | ICD-10-CM | POA: Insufficient documentation

## 2019-05-20 DIAGNOSIS — M3500 Sicca syndrome, unspecified: Secondary | ICD-10-CM | POA: Diagnosis not present

## 2019-05-20 DIAGNOSIS — F329 Major depressive disorder, single episode, unspecified: Secondary | ICD-10-CM | POA: Diagnosis not present

## 2019-05-20 DIAGNOSIS — Z01818 Encounter for other preprocedural examination: Secondary | ICD-10-CM | POA: Insufficient documentation

## 2019-05-20 DIAGNOSIS — Z91040 Latex allergy status: Secondary | ICD-10-CM | POA: Diagnosis not present

## 2019-05-20 LAB — PROTIME-INR
INR: 0.9 (ref 0.8–1.2)
Prothrombin Time: 12 seconds (ref 11.4–15.2)

## 2019-05-20 LAB — COMPREHENSIVE METABOLIC PANEL
ALT: 18 U/L (ref 0–44)
AST: 23 U/L (ref 15–41)
Albumin: 3.8 g/dL (ref 3.5–5.0)
Alkaline Phosphatase: 83 U/L (ref 38–126)
Anion gap: 9 (ref 5–15)
BUN: 20 mg/dL (ref 8–23)
CO2: 24 mmol/L (ref 22–32)
Calcium: 9.3 mg/dL (ref 8.9–10.3)
Chloride: 108 mmol/L (ref 98–111)
Creatinine, Ser: 0.71 mg/dL (ref 0.44–1.00)
GFR calc Af Amer: >60 mL/min (ref >60)
GFR calc non Af Amer: >60 mL/min (ref >60)
Glucose, Bld: 102 mg/dL — ABNORMAL HIGH (ref 70–99)
Potassium: 4.3 mmol/L (ref 3.5–5.1)
Sodium: 141 mmol/L (ref 135–145)
Total Bilirubin: 0.4 mg/dL (ref 0.3–1.2)
Total Protein: 7.1 g/dL (ref 6.5–8.1)

## 2019-05-20 LAB — CBC WITH DIFFERENTIAL/PLATELET
Abs Immature Granulocytes: 0.01 10*3/uL (ref 0.00–0.07)
Basophils Absolute: 0.1 10*3/uL (ref 0.0–0.1)
Basophils Relative: 1 %
Eosinophils Absolute: 0.2 10*3/uL (ref 0.0–0.5)
Eosinophils Relative: 3 %
HCT: 39.4 % (ref 36.0–46.0)
Hemoglobin: 12.2 g/dL (ref 12.0–15.0)
Immature Granulocytes: 0 %
Lymphocytes Relative: 25 %
Lymphs Abs: 1.7 10*3/uL (ref 0.7–4.0)
MCH: 27.4 pg (ref 26.0–34.0)
MCHC: 31 g/dL (ref 30.0–36.0)
MCV: 88.3 fL (ref 80.0–100.0)
Monocytes Absolute: 0.5 10*3/uL (ref 0.1–1.0)
Monocytes Relative: 8 %
Neutro Abs: 4.5 10*3/uL (ref 1.7–7.7)
Neutrophils Relative %: 63 %
Platelets: 296 10*3/uL (ref 150–400)
RBC: 4.46 MIL/uL (ref 3.87–5.11)
RDW: 13.9 % (ref 11.5–15.5)
WBC: 6.9 10*3/uL (ref 4.0–10.5)
nRBC: 0 % (ref 0.0–0.2)

## 2019-05-20 LAB — HEMOGLOBIN A1C
Hgb A1c MFr Bld: 5.1 % (ref 4.8–5.6)
Mean Plasma Glucose: 99.67 mg/dL

## 2019-05-20 LAB — ABO/RH: ABO/RH(D): A POS

## 2019-05-20 LAB — APTT: aPTT: 31 seconds (ref 24–36)

## 2019-05-21 MED ORDER — BUPIVACAINE LIPOSOME 1.3 % IJ SUSP
20.0000 mL | Freq: Once | INTRAMUSCULAR | Status: DC
  Filled 2019-05-21: qty 20

## 2019-05-22 ENCOUNTER — Inpatient Hospital Stay (HOSPITAL_COMMUNITY)
Admission: RE | Admit: 2019-05-22 | Discharge: 2019-05-25 | DRG: 330 | Disposition: A | Discharge status: Home / Self Care | Payer: Medicare Other | Attending: General Surgery | Admitting: General Surgery

## 2019-05-22 ENCOUNTER — Other Ambulatory Visit: Payer: Self-pay

## 2019-05-22 ENCOUNTER — Inpatient Hospital Stay (HOSPITAL_COMMUNITY): Payer: Medicare Other | Admitting: Certified Registered Nurse Anesthetist

## 2019-05-22 ENCOUNTER — Encounter (HOSPITAL_COMMUNITY)
Admission: RE | Disposition: A | Discharge status: Home / Self Care | Payer: Self-pay | Source: Home / Self Care | Attending: Surgery

## 2019-05-22 ENCOUNTER — Inpatient Hospital Stay (HOSPITAL_COMMUNITY): Payer: Medicare Other

## 2019-05-22 ENCOUNTER — Encounter (HOSPITAL_COMMUNITY): Payer: Self-pay | Admitting: Emergency Medicine

## 2019-05-22 DIAGNOSIS — Z90710 Acquired absence of both cervix and uterus: Secondary | ICD-10-CM | POA: Diagnosis not present

## 2019-05-22 DIAGNOSIS — Z6830 Body mass index (BMI) 30.0-30.9, adult: Secondary | ICD-10-CM

## 2019-05-22 DIAGNOSIS — Z885 Allergy status to narcotic agent status: Secondary | ICD-10-CM | POA: Diagnosis not present

## 2019-05-22 DIAGNOSIS — M3500 Sicca syndrome, unspecified: Secondary | ICD-10-CM | POA: Diagnosis present

## 2019-05-22 DIAGNOSIS — F419 Anxiety disorder, unspecified: Secondary | ICD-10-CM | POA: Diagnosis present

## 2019-05-22 DIAGNOSIS — K5720 Diverticulitis of large intestine with perforation and abscess without bleeding: Secondary | ICD-10-CM | POA: Diagnosis present

## 2019-05-22 DIAGNOSIS — Z803 Family history of malignant neoplasm of breast: Secondary | ICD-10-CM

## 2019-05-22 DIAGNOSIS — Z91018 Allergy to other foods: Secondary | ICD-10-CM

## 2019-05-22 DIAGNOSIS — K5733 Diverticulitis of large intestine without perforation or abscess with bleeding: Secondary | ICD-10-CM | POA: Diagnosis not present

## 2019-05-22 DIAGNOSIS — J45909 Unspecified asthma, uncomplicated: Secondary | ICD-10-CM | POA: Diagnosis present

## 2019-05-22 DIAGNOSIS — K660 Peritoneal adhesions (postprocedural) (postinfection): Secondary | ICD-10-CM | POA: Diagnosis present

## 2019-05-22 DIAGNOSIS — Z91040 Latex allergy status: Secondary | ICD-10-CM | POA: Diagnosis not present

## 2019-05-22 DIAGNOSIS — N321 Vesicointestinal fistula: Secondary | ICD-10-CM | POA: Diagnosis present

## 2019-05-22 DIAGNOSIS — E669 Obesity, unspecified: Secondary | ICD-10-CM | POA: Diagnosis present

## 2019-05-22 DIAGNOSIS — F329 Major depressive disorder, single episode, unspecified: Secondary | ICD-10-CM | POA: Diagnosis present

## 2019-05-22 DIAGNOSIS — Z9049 Acquired absence of other specified parts of digestive tract: Secondary | ICD-10-CM

## 2019-05-22 DIAGNOSIS — Z408 Encounter for other prophylactic surgery: Secondary | ICD-10-CM | POA: Diagnosis not present

## 2019-05-22 HISTORY — PX: CYSTOSCOPY WITH STENT PLACEMENT: SHX5790

## 2019-05-22 HISTORY — PX: XI ROBOTIC ASSISTED LOWER ANTERIOR RESECTION: SHX6558

## 2019-05-22 HISTORY — PX: FLEXIBLE SIGMOIDOSCOPY: SHX5431

## 2019-05-22 LAB — TYPE AND SCREEN
ABO/RH(D): A POS
Antibody Screen: NEGATIVE

## 2019-05-22 SURGERY — SIGMOIDOSCOPY, FLEXIBLE
Anesthesia: General | Site: Abdomen

## 2019-05-22 MED ORDER — NEOMYCIN SULFATE 500 MG PO TABS: 1000.0000 mg | ORAL_TABLET | ORAL | Status: DC

## 2019-05-22 MED ORDER — CYCLOBENZAPRINE HCL 10 MG PO TABS
10.0000 mg | ORAL_TABLET | Freq: Three times a day (TID) | ORAL | Status: DC | PRN
  Administered 2019-05-22: 20:00:00 10 mg via ORAL
  Filled 2019-05-22 (×2): qty 1

## 2019-05-22 MED ORDER — LIDOCAINE HCL 2 % IJ SOLN
INTRAMUSCULAR | Status: AC
  Filled 2019-05-22: qty 20

## 2019-05-22 MED ORDER — 0.9 % SODIUM CHLORIDE (POUR BTL) OPTIME
TOPICAL | Status: DC | PRN
  Administered 2019-05-22: 2000 mL

## 2019-05-22 MED ORDER — HYDRALAZINE HCL 20 MG/ML IJ SOLN: 10.0000 mg | INTRAMUSCULAR | Status: DC | PRN

## 2019-05-22 MED ORDER — SODIUM CHLORIDE 0.9 % IR SOLN
Status: DC | PRN
  Administered 2019-05-22: 3000 mL via INTRAVESICAL

## 2019-05-22 MED ORDER — ENSURE SURGERY PO LIQD
237.0000 mL | Freq: Two times a day (BID) | ORAL | Status: DC
  Administered 2019-05-23 (×2): 237 mL via ORAL
  Filled 2019-05-22 (×7): qty 237

## 2019-05-22 MED ORDER — PROPOFOL 10 MG/ML IV BOLUS
INTRAVENOUS | Status: DC | PRN
  Administered 2019-05-22: 100 mg via INTRAVENOUS
  Administered 2019-05-22: 30 mg via INTRAVENOUS
  Administered 2019-05-22: 20 mg via INTRAVENOUS

## 2019-05-22 MED ORDER — INDOCYANINE GREEN 25 MG IV SOLR
INTRAVENOUS | Status: DC | PRN
  Administered 2019-05-22: 7.5 mg via INTRAVENOUS

## 2019-05-22 MED ORDER — BUPIVACAINE-EPINEPHRINE (PF) 0.25% -1:200000 IJ SOLN
INTRAMUSCULAR | Status: AC
  Filled 2019-05-22: qty 30

## 2019-05-22 MED ORDER — ONDANSETRON HCL 4 MG/2ML IJ SOLN: 4.0000 mg | Freq: Four times a day (QID) | INTRAMUSCULAR | Status: DC | PRN

## 2019-05-22 MED ORDER — HYDROXYCHLOROQUINE SULFATE 200 MG PO TABS
200.0000 mg | ORAL_TABLET | Freq: Two times a day (BID) | ORAL | Status: DC
  Administered 2019-05-22 – 2019-05-25 (×6): 200 mg via ORAL
  Filled 2019-05-22 (×6): qty 1

## 2019-05-22 MED ORDER — LACTATED RINGERS IV SOLN
INTRAVENOUS | Status: DC
  Administered 2019-05-22: 20:00:00 via INTRAVENOUS

## 2019-05-22 MED ORDER — FENTANYL CITRATE (PF) 250 MCG/5ML IJ SOLN
INTRAMUSCULAR | Status: AC
  Filled 2019-05-22: qty 5

## 2019-05-22 MED ORDER — SUCCINYLCHOLINE CHLORIDE 200 MG/10ML IV SOSY
PREFILLED_SYRINGE | INTRAVENOUS | Status: AC
  Filled 2019-05-22: qty 10

## 2019-05-22 MED ORDER — CHLORHEXIDINE GLUCONATE CLOTH 2 % EX PADS: 6.0000 | MEDICATED_PAD | Freq: Once | CUTANEOUS | Status: DC

## 2019-05-22 MED ORDER — PANTOPRAZOLE SODIUM 40 MG PO TBEC
40.0000 mg | DELAYED_RELEASE_TABLET | Freq: Every day | ORAL | Status: DC
  Administered 2019-05-23 – 2019-05-25 (×3): 40 mg via ORAL
  Filled 2019-05-22 (×3): qty 1

## 2019-05-22 MED ORDER — PHENYLEPHRINE 40 MCG/ML (10ML) SYRINGE FOR IV PUSH (FOR BLOOD PRESSURE SUPPORT)
PREFILLED_SYRINGE | INTRAVENOUS | Status: AC
  Filled 2019-05-22: qty 10

## 2019-05-22 MED ORDER — PROMETHAZINE HCL 25 MG/ML IJ SOLN
INTRAMUSCULAR | Status: AC
  Filled 2019-05-22: qty 1

## 2019-05-22 MED ORDER — LABETALOL HCL 5 MG/ML IV SOLN
INTRAVENOUS | Status: DC | PRN
  Administered 2019-05-22 (×4): 2.5 mg via INTRAVENOUS

## 2019-05-22 MED ORDER — ALUM & MAG HYDROXIDE-SIMETH 200-200-20 MG/5ML PO SUSP: 30.0000 mL | Freq: Four times a day (QID) | ORAL | Status: DC | PRN

## 2019-05-22 MED ORDER — ROCURONIUM BROMIDE 50 MG/5ML IV SOSY
PREFILLED_SYRINGE | INTRAVENOUS | Status: DC | PRN
  Administered 2019-05-22: 10 mg via INTRAVENOUS
  Administered 2019-05-22: 20 mg via INTRAVENOUS
  Administered 2019-05-22: 10 mg via INTRAVENOUS
  Administered 2019-05-22: 20 mg via INTRAVENOUS
  Administered 2019-05-22: 50 mg via INTRAVENOUS

## 2019-05-22 MED ORDER — DIPHENHYDRAMINE HCL 12.5 MG/5ML PO ELIX: 12.5000 mg | ORAL_SOLUTION | Freq: Four times a day (QID) | ORAL | Status: DC | PRN

## 2019-05-22 MED ORDER — METHYLENE BLUE 0.5 % INJ SOLN
INTRAVENOUS | Status: AC
  Filled 2019-05-22: qty 10

## 2019-05-22 MED ORDER — ONDANSETRON HCL 4 MG/2ML IJ SOLN
INTRAMUSCULAR | Status: DC | PRN
  Administered 2019-05-22: 4 mg via INTRAVENOUS

## 2019-05-22 MED ORDER — HEPARIN SODIUM (PORCINE) 5000 UNIT/ML IJ SOLN
5000.0000 [IU] | Freq: Once | INTRAMUSCULAR | Status: AC
  Administered 2019-05-22: 5000 [IU] via SUBCUTANEOUS
  Filled 2019-05-22: qty 1

## 2019-05-22 MED ORDER — ONDANSETRON HCL 4 MG PO TABS: 4.0000 mg | ORAL_TABLET | Freq: Four times a day (QID) | ORAL | Status: DC | PRN

## 2019-05-22 MED ORDER — METRONIDAZOLE 500 MG PO TABS: 1000.0000 mg | ORAL_TABLET | ORAL | Status: DC

## 2019-05-22 MED ORDER — MONTELUKAST SODIUM 10 MG PO TABS
10.0000 mg | ORAL_TABLET | Freq: Every day | ORAL | Status: DC
  Administered 2019-05-22 – 2019-05-24 (×3): 10 mg via ORAL
  Filled 2019-05-22 (×3): qty 1

## 2019-05-22 MED ORDER — LIDOCAINE 2% (20 MG/ML) 5 ML SYRINGE
INTRAMUSCULAR | Status: DC | PRN
  Administered 2019-05-22: 80 mg via INTRAVENOUS

## 2019-05-22 MED ORDER — DEXAMETHASONE SODIUM PHOSPHATE 10 MG/ML IJ SOLN
INTRAMUSCULAR | Status: DC | PRN
  Administered 2019-05-22: 5 mg via INTRAVENOUS

## 2019-05-22 MED ORDER — TRAMADOL HCL 50 MG PO TABS
50.0000 mg | ORAL_TABLET | Freq: Four times a day (QID) | ORAL | Status: DC | PRN
  Filled 2019-05-22: qty 1

## 2019-05-22 MED ORDER — LIDOCAINE 20MG/ML (2%) 15 ML SYRINGE OPTIME
INTRAMUSCULAR | Status: DC | PRN
  Administered 2019-05-22: 1.5 mg/kg/h via INTRAVENOUS

## 2019-05-22 MED ORDER — PROMETHAZINE HCL 25 MG/ML IJ SOLN
6.2500 mg | INTRAMUSCULAR | Status: DC | PRN
  Administered 2019-05-22: 6.25 mg via INTRAVENOUS

## 2019-05-22 MED ORDER — SUCCINYLCHOLINE CHLORIDE 200 MG/10ML IV SOSY
PREFILLED_SYRINGE | INTRAVENOUS | Status: DC | PRN
  Administered 2019-05-22: 100 mg via INTRAVENOUS

## 2019-05-22 MED ORDER — PHENYLEPHRINE 40 MCG/ML (10ML) SYRINGE FOR IV PUSH (FOR BLOOD PRESSURE SUPPORT)
PREFILLED_SYRINGE | INTRAVENOUS | Status: DC | PRN
  Administered 2019-05-22 (×2): 80 ug via INTRAVENOUS

## 2019-05-22 MED ORDER — ROCURONIUM BROMIDE 10 MG/ML (PF) SYRINGE
PREFILLED_SYRINGE | INTRAVENOUS | Status: AC
  Filled 2019-05-22: qty 10

## 2019-05-22 MED ORDER — FENTANYL CITRATE (PF) 100 MCG/2ML IJ SOLN
INTRAMUSCULAR | Status: AC
  Filled 2019-05-22: qty 2

## 2019-05-22 MED ORDER — BUPIVACAINE LIPOSOME 1.3 % IJ SUSP
INTRAMUSCULAR | Status: DC | PRN
  Administered 2019-05-22: 20 mL

## 2019-05-22 MED ORDER — LACTATED RINGERS IV SOLN
INTRAVENOUS | Status: DC
  Administered 2019-05-22 (×4): via INTRAVENOUS

## 2019-05-22 MED ORDER — BUPIVACAINE-EPINEPHRINE (PF) 0.25% -1:200000 IJ SOLN
INTRAMUSCULAR | Status: DC | PRN
  Administered 2019-05-22: 30 mL

## 2019-05-22 MED ORDER — LIDOCAINE 2% (20 MG/ML) 5 ML SYRINGE
INTRAMUSCULAR | Status: AC
  Filled 2019-05-22: qty 5

## 2019-05-22 MED ORDER — FENTANYL CITRATE (PF) 100 MCG/2ML IJ SOLN
INTRAMUSCULAR | Status: DC | PRN
  Administered 2019-05-22 (×2): 50 ug via INTRAVENOUS
  Administered 2019-05-22: 25 ug via INTRAVENOUS
  Administered 2019-05-22: 100 ug via INTRAVENOUS
  Administered 2019-05-22 (×3): 50 ug via INTRAVENOUS

## 2019-05-22 MED ORDER — IBUPROFEN 200 MG PO TABS
600.0000 mg | ORAL_TABLET | Freq: Four times a day (QID) | ORAL | Status: DC | PRN
  Administered 2019-05-23 – 2019-05-24 (×3): 600 mg via ORAL
  Filled 2019-05-22 (×3): qty 3

## 2019-05-22 MED ORDER — PROPOFOL 10 MG/ML IV BOLUS
INTRAVENOUS | Status: AC
  Filled 2019-05-22: qty 20

## 2019-05-22 MED ORDER — SODIUM CHLORIDE 0.9 % IV SOLN
2.0000 g | INTRAVENOUS | Status: AC
  Administered 2019-05-22: 08:00:00 2 g via INTRAVENOUS
  Filled 2019-05-22: qty 2

## 2019-05-22 MED ORDER — ONDANSETRON HCL 4 MG/2ML IJ SOLN
INTRAMUSCULAR | Status: AC
  Filled 2019-05-22: qty 2

## 2019-05-22 MED ORDER — VITAMIN B-12 1000 MCG PO TABS
1000.0000 ug | ORAL_TABLET | Freq: Every day | ORAL | Status: DC
  Administered 2019-05-23 – 2019-05-25 (×3): 1000 ug via ORAL
  Filled 2019-05-22 (×3): qty 1

## 2019-05-22 MED ORDER — ACETAMINOPHEN 500 MG PO TABS
1000.0000 mg | ORAL_TABLET | Freq: Four times a day (QID) | ORAL | Status: DC
  Administered 2019-05-22 – 2019-05-25 (×8): 1000 mg via ORAL
  Filled 2019-05-22 (×9): qty 2

## 2019-05-22 MED ORDER — HYDROMORPHONE HCL 1 MG/ML IJ SOLN
0.5000 mg | INTRAMUSCULAR | Status: DC | PRN
  Administered 2019-05-22 – 2019-05-23 (×4): 0.5 mg via INTRAVENOUS
  Filled 2019-05-22 (×6): qty 0.5

## 2019-05-22 MED ORDER — ACETAMINOPHEN 500 MG PO TABS
1000.0000 mg | ORAL_TABLET | ORAL | Status: AC
  Administered 2019-05-22: 1000 mg via ORAL
  Filled 2019-05-22: qty 2

## 2019-05-22 MED ORDER — HEPARIN SODIUM (PORCINE) 5000 UNIT/ML IJ SOLN
5000.0000 [IU] | Freq: Three times a day (TID) | INTRAMUSCULAR | Status: DC
  Administered 2019-05-22 – 2019-05-25 (×8): 5000 [IU] via SUBCUTANEOUS
  Filled 2019-05-22 (×7): qty 1

## 2019-05-22 MED ORDER — LACTATED RINGERS IV SOLN
INTRAVENOUS | Status: AC | PRN
  Administered 2019-05-22: 1

## 2019-05-22 MED ORDER — SODIUM CHLORIDE 0.9 % IV SOLN
INTRAVENOUS | Status: DC | PRN
  Administered 2019-05-22: 25 ug/min via INTRAVENOUS
  Administered 2019-05-22: 80 ug/min via INTRAVENOUS

## 2019-05-22 MED ORDER — POLYETHYLENE GLYCOL 3350 17 GM/SCOOP PO POWD: 1.0000 | Freq: Once | ORAL | Status: DC

## 2019-05-22 MED ORDER — HYDROMORPHONE HCL 1 MG/ML IJ SOLN
INTRAMUSCULAR | Status: AC
  Filled 2019-05-22: qty 1

## 2019-05-22 MED ORDER — ALBUTEROL SULFATE (2.5 MG/3ML) 0.083% IN NEBU: 2.5000 mg | INHALATION_SOLUTION | Freq: Four times a day (QID) | RESPIRATORY_TRACT | Status: DC | PRN

## 2019-05-22 MED ORDER — HYDROMORPHONE HCL 1 MG/ML IJ SOLN
0.2500 mg | INTRAMUSCULAR | Status: DC | PRN
  Administered 2019-05-22 (×2): 0.25 mg via INTRAVENOUS

## 2019-05-22 MED ORDER — DIPHENHYDRAMINE HCL 50 MG/ML IJ SOLN: 12.5000 mg | Freq: Four times a day (QID) | INTRAMUSCULAR | Status: DC | PRN

## 2019-05-22 MED ORDER — LABETALOL HCL 5 MG/ML IV SOLN
INTRAVENOUS | Status: AC
  Filled 2019-05-22: qty 4

## 2019-05-22 MED ORDER — ALVIMOPAN 12 MG PO CAPS
12.0000 mg | ORAL_CAPSULE | ORAL | Status: AC
  Administered 2019-05-22: 12 mg via ORAL
  Filled 2019-05-22: qty 1

## 2019-05-22 MED ORDER — SACCHAROMYCES BOULARDII 250 MG PO CAPS
250.0000 mg | ORAL_CAPSULE | Freq: Every day | ORAL | Status: DC
  Administered 2019-05-23 – 2019-05-25 (×3): 250 mg via ORAL
  Filled 2019-05-22 (×3): qty 1

## 2019-05-22 SURGICAL SUPPLY — 111 items
APPLIER CLIP 5 13 M/L LIGAMAX5 (MISCELLANEOUS)
APPLIER CLIP ROT 10 11.4 M/L (STAPLE)
BAG URO CATCHER STRL LF (MISCELLANEOUS) ×4 IMPLANT
BLADE EXTENDED COATED 6.5IN (ELECTRODE) ×4 IMPLANT
CANNULA REDUC XI 12-8 STAPL (CANNULA) ×1
CANNULA REDUC XI 12-8MM STAPL (CANNULA) ×1
CANNULA REDUCER 12-8 DVNC XI (CANNULA) ×2 IMPLANT
CATH URET 5FR 28IN OPEN ENDED (CATHETERS) IMPLANT
CELLS DAT CNTRL 66122 CELL SVR (MISCELLANEOUS) IMPLANT
CHLORAPREP W/TINT 26 (MISCELLANEOUS) ×4 IMPLANT
CLIP APPLIE 5 13 M/L LIGAMAX5 (MISCELLANEOUS) IMPLANT
CLIP APPLIE ROT 10 11.4 M/L (STAPLE) IMPLANT
CLIP VESOLOCK LG 6/CT PURPLE (CLIP) IMPLANT
CLIP VESOLOCK MED LG 6/CT (CLIP) IMPLANT
CLOTH BEACON ORANGE TIMEOUT ST (SAFETY) ×4 IMPLANT
COVER SURGICAL LIGHT HANDLE (MISCELLANEOUS) ×8 IMPLANT
COVER TIP SHEARS 8 DVNC (MISCELLANEOUS) ×2 IMPLANT
COVER TIP SHEARS 8MM DA VINCI (MISCELLANEOUS) ×2
COVER WAND RF STERILE (DRAPES) IMPLANT
DECANTER SPIKE VIAL GLASS SM (MISCELLANEOUS) ×4 IMPLANT
DEVICE TROCAR PUNCTURE CLOSURE (ENDOMECHANICALS) IMPLANT
DRAIN CHANNEL 19F RND (DRAIN) ×4 IMPLANT
DRAPE ARM DVNC X/XI (DISPOSABLE) ×8 IMPLANT
DRAPE COLUMN DVNC XI (DISPOSABLE) ×2 IMPLANT
DRAPE DA VINCI XI ARM (DISPOSABLE) ×8
DRAPE DA VINCI XI COLUMN (DISPOSABLE) ×2
DRAPE SURG IRRIG POUCH 19X23 (DRAPES) ×4 IMPLANT
DRSG OPSITE POSTOP 4X10 (GAUZE/BANDAGES/DRESSINGS) IMPLANT
DRSG OPSITE POSTOP 4X6 (GAUZE/BANDAGES/DRESSINGS) ×2 IMPLANT
DRSG OPSITE POSTOP 4X8 (GAUZE/BANDAGES/DRESSINGS) IMPLANT
DRSG TEGADERM 2-3/8X2-3/4 SM (GAUZE/BANDAGES/DRESSINGS) ×20 IMPLANT
DRSG TEGADERM 4X4.75 (GAUZE/BANDAGES/DRESSINGS) ×4 IMPLANT
ELECT PENCIL ROCKER SW 15FT (MISCELLANEOUS) ×4 IMPLANT
ELECT REM PT RETURN 15FT ADLT (MISCELLANEOUS) ×4 IMPLANT
ENDOLOOP SUT PDS II  0 18 (SUTURE)
ENDOLOOP SUT PDS II 0 18 (SUTURE) IMPLANT
EVACUATOR SILICONE 100CC (DRAIN) ×4 IMPLANT
GAUZE SPONGE 2X2 8PLY STRL LF (GAUZE/BANDAGES/DRESSINGS) ×2 IMPLANT
GAUZE SPONGE 4X4 12PLY STRL (GAUZE/BANDAGES/DRESSINGS) IMPLANT
GLOVE BIO SURGEON STRL SZ7.5 (GLOVE) ×12 IMPLANT
GLOVE INDICATOR 8.0 STRL GRN (GLOVE) ×12 IMPLANT
GLOVE SURG SS PI 8.0 STRL IVOR (GLOVE) IMPLANT
GOWN STRL REUS W/TWL XL LVL3 (GOWN DISPOSABLE) ×24 IMPLANT
GRASPER SUT TROCAR 14GX15 (MISCELLANEOUS) ×4 IMPLANT
GUIDEWIRE STR DUAL SENSOR (WIRE) ×4 IMPLANT
HOLDER FOLEY CATH W/STRAP (MISCELLANEOUS) ×4 IMPLANT
IRRIG SUCT STRYKERFLOW 2 WTIP (MISCELLANEOUS)
IRRIGATION SUCT STRKRFLW 2 WTP (MISCELLANEOUS) IMPLANT
KIT PROCEDURE DA VINCI SI (MISCELLANEOUS)
KIT PROCEDURE DVNC SI (MISCELLANEOUS) IMPLANT
KIT TURNOVER KIT A (KITS) IMPLANT
MANIFOLD NEPTUNE II (INSTRUMENTS) ×4 IMPLANT
NDL INSUFFLATION 14GA 120MM (NEEDLE) ×2 IMPLANT
NEEDLE INSUFFLATION 14GA 120MM (NEEDLE) ×4 IMPLANT
PACK CARDIOVASCULAR III (CUSTOM PROCEDURE TRAY) ×4 IMPLANT
PACK COLON (CUSTOM PROCEDURE TRAY) ×4 IMPLANT
PACK CYSTO (CUSTOM PROCEDURE TRAY) ×4 IMPLANT
PAD POSITIONING PINK XL (MISCELLANEOUS) ×4 IMPLANT
PORT LAP GEL ALEXIS MED 5-9CM (MISCELLANEOUS) ×4 IMPLANT
PROTECTOR NERVE ULNAR (MISCELLANEOUS) ×8 IMPLANT
RELOAD STAPLE 45 BLU REG DVNC (STAPLE) IMPLANT
RELOAD STAPLE 45 GRN THCK DVNC (STAPLE) IMPLANT
RETRACTOR WND ALEXIS 18 MED (MISCELLANEOUS) IMPLANT
RTRCTR WOUND ALEXIS 18CM MED (MISCELLANEOUS)
SCISSORS LAP 5X35 DISP (ENDOMECHANICALS) ×4 IMPLANT
SEAL CANN UNIV 5-8 DVNC XI (MISCELLANEOUS) ×8 IMPLANT
SEAL XI 5MM-8MM UNIVERSAL (MISCELLANEOUS) ×8
SEALER VESSEL DA VINCI XI (MISCELLANEOUS) ×2
SEALER VESSEL EXT DVNC XI (MISCELLANEOUS) ×2 IMPLANT
SLEEVE ADV FIXATION 5X100MM (TROCAR) IMPLANT
SOLUTION ELECTROLUBE (MISCELLANEOUS) ×4 IMPLANT
SPONGE GAUZE 2X2 STER 10/PKG (GAUZE/BANDAGES/DRESSINGS) ×2
STAPLER 45 BLU RELOAD XI (STAPLE) IMPLANT
STAPLER 45 BLUE RELOAD XI (STAPLE)
STAPLER 45 GREEN RELOAD XI (STAPLE)
STAPLER 45 GRN RELOAD XI (STAPLE) IMPLANT
STAPLER CANNULA SEAL DVNC XI (STAPLE) ×2 IMPLANT
STAPLER CANNULA SEAL XI (STAPLE) ×2
STAPLER ECHELON POWER CIR 29 (STAPLE) ×2 IMPLANT
STAPLER SHEATH (SHEATH) ×2
STAPLER SHEATH ENDOWRIST DVNC (SHEATH) ×2 IMPLANT
SURGILUBE 2OZ TUBE FLIPTOP (MISCELLANEOUS) ×4 IMPLANT
SUT MNCRL AB 4-0 PS2 18 (SUTURE) ×4 IMPLANT
SUT PDS AB 1 CTX 36 (SUTURE) IMPLANT
SUT PDS AB 1 TP1 96 (SUTURE) IMPLANT
SUT PROLENE 0 CT 2 (SUTURE) IMPLANT
SUT PROLENE 2 0 KS (SUTURE) IMPLANT
SUT PROLENE 2 0 SH DA (SUTURE) IMPLANT
SUT SILK 2 0 (SUTURE)
SUT SILK 2 0 SH CR/8 (SUTURE) IMPLANT
SUT SILK 2-0 18XBRD TIE 12 (SUTURE) IMPLANT
SUT SILK 3 0 (SUTURE) ×2
SUT SILK 3 0 SH CR/8 (SUTURE) ×4 IMPLANT
SUT SILK 3-0 18XBRD TIE 12 (SUTURE) ×2 IMPLANT
SUT V-LOC BARB 180 2/0GR6 GS22 (SUTURE)
SUT VIC AB 3-0 SH 18 (SUTURE) IMPLANT
SUT VIC AB 3-0 SH 27 (SUTURE)
SUT VIC AB 3-0 SH 27XBRD (SUTURE) IMPLANT
SUT VICRYL 0 UR6 27IN ABS (SUTURE) ×4 IMPLANT
SUTURE V-LC BRB 180 2/0GR6GS22 (SUTURE) IMPLANT
SYR 10ML LL (SYRINGE) ×4 IMPLANT
SYS LAPSCP GELPORT 120MM (MISCELLANEOUS)
SYSTEM LAPSCP GELPORT 120MM (MISCELLANEOUS) IMPLANT
TAPE UMBILICAL COTTON 1/8X30 (MISCELLANEOUS) ×4 IMPLANT
TOWEL OR NON WOVEN STRL DISP B (DISPOSABLE) ×4 IMPLANT
TRAY FOLEY MTR SLVR 16FR STAT (SET/KITS/TRAYS/PACK) ×4 IMPLANT
TROCAR ADV FIXATION 5X100MM (TROCAR) ×4 IMPLANT
TUBING CONNECTING 10 (TUBING) ×9 IMPLANT
TUBING CONNECTING 10' (TUBING) ×3
TUBING INSUFFLATION 10FT LAP (TUBING) ×4 IMPLANT
TUBING UROLOGY SET (TUBING) IMPLANT

## 2019-05-22 NOTE — H&P (Signed)
CC: Here today for surgery - hx of sigmoid diverticulitis, probable colovesical fistula, possible small bowel involvement  HPI: Denise Payne is a very pleasant 3yoF with hx of Sjogren syndrome (managed with hydroxychloroquine, no steroids), asthma who presented to the hospital 09/2019 with abdominal pain. She is worked up and found to have a leukocytosis and abdominal tenderness on exam. She underwent CT scan of the abdomen and pelvis which showed sigmoid diverticulitis with microperforation and a few clips of gas in the mesentery, air and the dome of the bladder with suspicion for colovesical fistula. She at that time and noted having at least a year worth of symptoms regarding pneumaturia. She hadn't had any prior known history of diverticulitis. She is admitted hospital she had a relatively protracted course and was discharged on 02/08/2019 on oral antibiotics. She had bloating and nausea and was readmitted 02/14/2019 at which time she had a CT scan which demonstrated inflammatory changes around the proximal sigmoid colon which was slightly worsened in appearance as well as a reactive ileus/partial small bowel traction with some secondarily inflamed/enteritis appearing small intestine on her scan. She was placed on Cipro Flagyl and admitted hospital. With this regimen, she improved. She was discharged 02/19/2019 and is here today for follow-up. She denies any significant complaints at this time.  She denies any nausea/vomiting. She is having gas and bowel movements. She is tolerating a diet-low fiber at this time. She has been eating carrots that are cooked.  She reports having had her last colonoscopy 2 years ago in [facility] She reports that this was normal as far she can recall.  PMH: Sjogren syndrome (well controlled with hydroxychloroquine, denies steroids); asthma and (well controlled with when necessary inhalers)  PSH: Hysterectomy via Pfannenstiel. Denies any other  abdominal or pelvic procedures.  FHx: Denies FHx of malignancy  Social: Denies use of tobacco/drugs; occasional social EtOH use - not daily. None since last admission. She is a retired Therapist, sports  ROS: A comprehensive 10 system review of systems was completed with the patient and pertinent findings as noted above.  Past Medical History:  Diagnosis Date  . Asthma   . Sjogren's syndrome ([place]) 01/26/2019    Past Surgical History:  Procedure Laterality Date  . AUGMENTATION MAMMAPLASTY Bilateral 10/16/1976    Family History  Problem Relation Age of Onset  . Breast cancer Mother 15    Social:  reports that she has never smoked. She has never used smokeless tobacco. She reports current alcohol use of about 1.0 standard drinks of alcohol per week. She reports that she does not use drugs.  Allergies:  Allergies  Allergen Reactions  . Latex Shortness Of Breath and Swelling    Lip and eye swelling  . Morphine And Related Other (See Comments)    Makes her cramp in abdomen   . Onion Diarrhea and Other (See Comments)    Stomach cramps    Medications: I have reviewed the patient's current medications.  Results for orders placed or performed during the hospital encounter of 05/20/19 (from the past 48 hour(s))  ABO/Rh     Status: None   Collection Time: 05/20/19  4:08 PM  Result Value Ref Range   ABO/RH(D)      A POS Performed at [facility], [place] [address], [place], [place] [address]   APTT     Status: None   Collection Time: 05/20/19  4:10 PM  Result Value Ref Range   aPTT 31 24 - 36  seconds    Comment: Performed at [facility], [place] [address], [place], [place] [address]  CBC WITH DIFFERENTIAL     Status: None   Collection Time: 05/20/19  4:10 PM  Result Value Ref Range   WBC 6.9 4.0 - 10.5 K/uL   RBC 4.46 3.87 - 5.11 MIL/uL   Hemoglobin 12.2 12.0 - 15.0 g/dL   HCT 39.4 36.0 - 46.0 %   MCV 88.3 80.0 - 100.0 fL   MCH 27.4 26.0 - 34.0 pg    MCHC 31.0 30.0 - 36.0 g/dL   RDW 13.9 11.5 - 15.5 %   Platelets 296 150 - 400 K/uL   nRBC 0.0 0.0 - 0.2 %   Neutrophils Relative % 63 %   Neutro Abs 4.5 1.7 - 7.7 K/uL   Lymphocytes Relative 25 %   Lymphs Abs 1.7 0.7 - 4.0 K/uL   Monocytes Relative 8 %   Monocytes Absolute 0.5 0.1 - 1.0 K/uL   Eosinophils Relative 3 %   Eosinophils Absolute 0.2 0.0 - 0.5 K/uL   Basophils Relative 1 %   Basophils Absolute 0.1 0.0 - 0.1 K/uL   Immature Granulocytes 0 %   Abs Immature Granulocytes 0.01 0.00 - 0.07 K/uL    Comment: Performed at [facility], [place] [address], [place], [place] [address]  Comprehensive metabolic panel     Status: Abnormal   Collection Time: 05/20/19  4:10 PM  Result Value Ref Range   Sodium 141 135 - 145 mmol/L   Potassium 4.3 3.5 - 5.1 mmol/L   Chloride 108 98 - 111 mmol/L   CO2 24 22 - 32 mmol/L   Glucose, Bld 102 (H) 70 - 99 mg/dL   BUN 20 8 - 23 mg/dL   Creatinine, Ser 0.71 0.44 - 1.00 mg/dL   Calcium 9.3 8.9 - 10.3 mg/dL   Total Protein 7.1 6.5 - 8.1 g/dL   Albumin 3.8 3.5 - 5.0 g/dL   AST 23 15 - 41 U/L   ALT 18 0 - 44 U/L   Alkaline Phosphatase 83 38 - 126 U/L   Total Bilirubin 0.4 0.3 - 1.2 mg/dL   GFR calc non Af Amer >60 >60 mL/min   GFR calc Af Amer >60 >60 mL/min   Anion gap 9 5 - 15    Comment: Performed at [facility], [place] [address], [place], [place] [address]  Hemoglobin A1c     Status: None   Collection Time: 05/20/19  4:10 PM  Result Value Ref Range   Hgb A1c MFr Bld 5.1 4.8 - 5.6 %    Comment: (NOTE) Pre diabetes:          5.7%-6.4% Diabetes:              >6.4% Glycemic control for   <7.0% adults with diabetes    Mean Plasma Glucose 99.67 mg/dL    Comment: Performed at [place] [address], [place], [place] [address]  Protime-INR     Status: None   Collection Time: 05/20/19  4:10 PM  Result Value Ref Range   Prothrombin Time 12.0 11.4 - 15.2 seconds   INR 0.9 0.8 - 1.2    Comment:  (NOTE) INR goal varies based on device and disease states. Performed at [facility], [place] [address], [place],  [address]   Type and screen     Status: None   Collection Time: 05/20/19  4:10 PM  Result Value Ref Range   ABO/RH(D) A POS    Antibody Screen NEG    Sample Expiration 05/25/2019,2359    Extend sample reason      NO TRANSFUSIONS OR PREGNANCY IN THE PAST 3 MONTHS Performed at [place] [address], [place], [place] [address]     No results found.  ROS - all of the below systems have been reviewed with the patient and positives are indicated with bold text General: chills, fever or night sweats Eyes: blurry vision or double vision ENT: epistaxis or sore throat Allergy/Immunology: itchy/watery eyes or nasal congestion Hematologic/Lymphatic: bleeding problems, blood clots or swollen lymph nodes Endocrine: temperature intolerance or unexpected weight changes Breast: new or changing breast lumps or nipple discharge Resp: cough, shortness of breath, or wheezing CV: chest pain or dyspnea on exertion GI: as per HPI GU: dysuria, trouble voiding, or hematuria MSK: joint pain or joint stiffness Neuro: TIA or stroke symptoms Derm: pruritus and skin lesion changes Psych: anxiety and depression  PE Blood pressure (!) 153/79, pulse 97, temperature 97.8 F (36.6 C), temperature source Oral, resp. rate 18, height 5\' 2"  (1.575 m), weight 75 kg, SpO2 100 %. Constitutional: NAD; conversant; no deformities Eyes: Moist conjunctiva; no lid lag; anicteric; PERRL Neck: Trachea midline; no thyromegaly Lungs: Normal respiratory effort; no tactile fremitus CV: RRR; no palpable thrills; no pitting edema GI: Abd soft, NT/ND; no palpable hepatosplenomegaly MSK: Normal gait; no clubbing/cyanosis Psychiatric: Appropriate affect; alert and oriented x3 Lymphatic: No palpable cervical or axillary lymphadenopathy  Results for orders placed or  performed during the hospital encounter of 05/20/19 (from the past 48 hour(s))  ABO/Rh     Status: None   Collection Time: 05/20/19  4:08 PM  Result Value Ref Range   ABO/RH(D)      A POS Performed at [facility], [place] [address] [place], [place] [address]   APTT     Status: None   Collection Time: 05/20/19  4:10 PM  Result Value Ref Range   aPTT 31 24 - 36 seconds    Comment: Performed at [facility], [place] [address], [place], [place] [address]  CBC WITH DIFFERENTIAL     Status: None   Collection Time: 05/20/19  4:10 PM  Result Value Ref Range   WBC 6.9 4.0 - 10.5 K/uL   RBC 4.46 3.87 - 5.11 MIL/uL   Hemoglobin 12.2 12.0 - 15.0 g/dL   HCT 39.4 36.0 - 46.0 %   MCV 88.3 80.0 - 100.0 fL   MCH 27.4 26.0 - 34.0 pg   MCHC 31.0 30.0 - 36.0 g/dL   RDW 13.9 11.5 - 15.5 %   Platelets 296 150 - 400 K/uL   nRBC 0.0 0.0 - 0.2 %   Neutrophils Relative % 63 %   Neutro Abs 4.5 1.7 - 7.7 K/uL   Lymphocytes Relative 25 %   Lymphs Abs 1.7 0.7 - 4.0 K/uL   Monocytes Relative 8 %   Monocytes Absolute 0.5 0.1 - 1.0 K/uL   Eosinophils Relative 3 %   Eosinophils Absolute 0.2 0.0 - 0.5 K/uL   Basophils Relative 1 %   Basophils Absolute 0.1 0.0 - 0.1 K/uL   Immature Granulocytes 0 %   Abs Immature Granulocytes 0.01 0.00 - 0.07 K/uL    Comment: Performed at [facility], [place] [address], [place], [place] [address]  Comprehensive metabolic panel     Status: Abnormal   Collection Time:  05/20/19  4:10 PM  Result Value Ref Range   Sodium 141 135 - 145 mmol/L   Potassium 4.3 3.5 - 5.1 mmol/L   Chloride 108 98 - 111 mmol/L   CO2 24 22 - 32 mmol/L   Glucose, Bld 102 (H) 70 - 99 mg/dL   BUN 20 8 - 23 mg/dL   Creatinine, Ser 0.71 0.44 - 1.00 mg/dL   Calcium 9.3 8.9 - 10.3 mg/dL   Total Protein 7.1 6.5 - 8.1 g/dL   Albumin 3.8 3.5 - 5.0 g/dL   AST 23 15 - 41 U/L   ALT 18 0 - 44 U/L   Alkaline Phosphatase 83 38 - 126 U/L   Total Bilirubin 0.4  0.3 - 1.2 mg/dL   GFR calc non Af Amer >60 >60 mL/min   GFR calc Af Amer >60 >60 mL/min   Anion gap 9 5 - 15    Comment: Performed at [facility], [place] [address], [place], [place] [address]  Hemoglobin A1c     Status: None   Collection Time: 05/20/19  4:10 PM  Result Value Ref Range   Hgb A1c MFr Bld 5.1 4.8 - 5.6 %    Comment: (NOTE) Pre diabetes:          5.7%-6.4% Diabetes:              >6.4% Glycemic control for   <7.0% adults with diabetes    Mean Plasma Glucose 99.67 mg/dL    Comment: Performed at [place] [address] [place], [place] [address]  Protime-INR     Status: None   Collection Time: 05/20/19  4:10 PM  Result Value Ref Range   Prothrombin Time 12.0 11.4 - 15.2 seconds   INR 0.9 0.8 - 1.2    Comment: (NOTE) INR goal varies based on device and disease states. Performed at [facility], [place] [address] [place], [place] [address]   Type and screen     Status: None   Collection Time: 05/20/19  4:10 PM  Result Value Ref Range   ABO/RH(D) A POS    Antibody Screen NEG    Sample Expiration 05/25/2019,2359    Extend sample reason      NO TRANSFUSIONS OR PREGNANCY IN THE PAST 3 MONTHS Performed at [facility], [place] [address] [place], [place] [address]     No results found.   A/P: Ms. 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 The pathophysiology of diverticulitis and colovesical fistulas was discussed at length as well. -We discussed robotic/laparoscopic and possible open sigmoid colectomy, possible repair of colovesical fistula, possible small bowel resection, flexible sigmoidoscopy; cysto/stents  with urology -The planned procedures, material risks (including, but not limited to, pain, bleeding, infection, scarring, need for blood transfusion, damage to surrounding structures- blood vessels/nerves/viscus/organs, damage to ureter, urine leak, leak from anastomosis, need for additional procedures, DVT/PE, need for stoma which may be permanent, hernia, recurrence of ~3%, pneumonia, heart attack, stroke, death) benefits and alternatives to surgery were discussed at length. I noted a good probability that the procedure would help improve her symptoms through risk reduction of recurrence. The patient's questions were answered to her satisfaction, she voiced understanding  and elected to proceed with surgery. Additionally, we discussed typical postoperative expectations and the recovery process.  Denise Payne, M.D. [facility]

## 2019-05-22 NOTE — Anesthesia Procedure Notes (Signed)
Procedure Name: Intubation Date/Time: 05/22/2019 7:34 AM Performed by: Montel Clock, CRNA Pre-anesthesia Checklist: Patient identified, Emergency Drugs available, Suction available, Patient being monitored and Timeout performed Patient Re-evaluated:Patient Re-evaluated prior to induction Oxygen Delivery Method: Circle system utilized Preoxygenation: Pre-oxygenation with 100% oxygen Induction Type: IV induction and Rapid sequence Laryngoscope Size: Mac and 3 Grade View: Grade II Tube type: Oral Tube size: 7.0 mm Number of attempts: 1 Airway Equipment and Method: Stylet Placement Confirmation: ETT inserted through vocal cords under direct vision,  positive ETCO2 and breath sounds checked- equal and bilateral Secured at: 21 cm Tube secured with: Tape Dental Injury: Teeth and Oropharynx as per pre-operative assessment  Comments: Grade 2b view with downward laryngeal pressure. ETT easily passed through cords. Anterior glottis.

## 2019-05-22 NOTE — Transfer of Care (Signed)
Immediate Anesthesia Transfer of Care Note  Patient: Denise Payne  Procedure(s) Performed: FLEXIBLE SIGMOIDOSCOPY (N/A ) XI ROBOTIC ASSISTED SIGMOIDECTOMY (Bilateral Abdomen) CYSTOSCOPY WITH STENT PLACEMENT (N/A )  Patient Location: PACU  Anesthesia Type:General  Level of Consciousness: drowsy and patient cooperative  Airway & Oxygen Therapy: Patient Spontanous Breathing and Patient connected to face mask oxygen  Post-op Assessment: Report given to RN and Post -op Vital signs reviewed and stable  Post vital signs: Reviewed and stable  Last Vitals:  Vitals Value Taken Time  BP 141/89 05/22/19 1145  Temp    Pulse 66 05/22/19 1148  Resp 14 05/22/19 1148  SpO2 100 % 05/22/19 1148  Vitals shown include unvalidated device data.  Last Pain:  Vitals:   05/22/19 0607  TempSrc:   PainSc: 0-No pain      Patients Stated Pain Goal: 4 (56/38/75 6433)  Complications: No apparent anesthesia complications

## 2019-05-22 NOTE — Op Note (Signed)
Procedure: Cystoscopy with insertion of bilateral ureteral catheters.  Preop diagnosis: Colovesical fistula.  Postop diagnosis: Same.  Surgeon: Dr. Irine Seal.  Anesthesia: General.  Drains: Bilateral 5 French ureteral catheters and Foley catheter.  Specimen: None.  EBL: None.  Complications: None.  Indications: The patient is a 73 year old female with a colovesical fistula.  Ureteral catheters were requested to aid ureteral identification during colon resection.  Procedure: She had been given 2 g of cefotetan.  A general anesthetic was induced.  She was placed in lithotomy position and fitted with PAS hose.  Her perineum and genitalia were prepped with Betadine solution and she was draped in usual sterile fashion.  Cystoscopy was performed using a 23 [place] scope and 30 degree lens.  Examination revealed a normal urethra.  The bladder wall was normal in appearance with the exception of some erythema and edema on the left dome with a central fistula tract.  There was debris in the bladder.  The ureteral orifices were unremarkable.  A 5 French open end ureteral catheter was passed up the left ureteral orifice to the kidney under fluoroscopic guidance without difficulty.  A 5 French open-ended catheter was passed up the right ureteral orifice to the kidney under fluoroscopic guidance without difficulty.  The cystoscope was removed and a 16 French Foley catheter was placed.  The balloon was filled with 10 cc of sterile fluid.  The Foley catheter and ureteral catheters were placed to a [place] device and ureteral catheters were secured to the Foley with a silk tie.  The [place] device was placed to drainage bag.  There were no complications during my portion of the procedure which was then turned over to Dr. Dema Severin for colon resection.

## 2019-05-22 NOTE — Anesthesia Preprocedure Evaluation (Signed)
Anesthesia Evaluation  Patient identified by MRN, date of birth, ID band Patient awake    Reviewed: Allergy & Precautions, NPO status , Patient's Chart, lab work & pertinent test results  Airway Mallampati: II  TM Distance: >3 FB Neck ROM: Full    Dental no notable dental hx.    Pulmonary asthma ,    Pulmonary exam normal breath sounds clear to auscultation       Cardiovascular negative cardio ROS Normal cardiovascular exam Rhythm:Regular Rate:Normal     Neuro/Psych negative neurological ROS  negative psych ROS   GI/Hepatic negative GI ROS, Neg liver ROS,   Endo/Other  negative endocrine ROS  Renal/GU negative Renal ROS  negative genitourinary   Musculoskeletal negative musculoskeletal ROS (+)   Abdominal   Peds negative pediatric ROS (+)  Hematology negative hematology ROS (+)   Anesthesia Other Findings   Reproductive/Obstetrics negative OB ROS                             Anesthesia Physical Anesthesia Plan  ASA: II  Anesthesia Plan: General   Post-op Pain Management:    Induction: Intravenous  PONV Risk Score and Plan: 3 and Ondansetron, Dexamethasone and Treatment may vary due to age or medical condition  Airway Management Planned: Oral ETT  Additional Equipment:   Intra-op Plan:   Post-operative Plan: Extubation in OR  Informed Consent: I have reviewed the patients History and Physical, chart, labs and discussed the procedure including the risks, benefits and alternatives for the proposed anesthesia with the patient or authorized representative who has indicated his/her understanding and acceptance.     Dental advisory given  Plan Discussed with: CRNA and Surgeon  Anesthesia Plan Comments:         Anesthesia Quick Evaluation

## 2019-05-22 NOTE — Op Note (Addendum)
PATIENT: Denise Payne  73 y.o. female  Patient Care Team: Leeroy Cha, MD as PCP - General (Internal Medicine)  PREOP DIAGNOSIS: SIGMOID DIVERTICULITIS  POSTOP DIAGNOSIS: SIGMOID DIVERTICULITIS  PROCEDURE:  1. Robotic-assisted low anterior resection with double stapled colorectal anastomosis 2. Robotic lysis of adhesions (35 minutes) 3. Flexible sigmoidoscopy 4. Bilateral transversus abdominus plane blocks  SURGEON: Sharon Mt. Elmor Kost, MD  ASSISTANT: Leighton Ruff, MD  An MD assistant was necessary for tissue manipulation, retraction and positioning due to the complexity of the case, obesity of the patient and hospital policies  ANESTHESIA: General endotracheal  EBL: 50 mL Total I/O In: 3200 [I.V.:3200] Out: 200 [Urine:100; Blood:100]  DRAINS: None  SPECIMEN: Sigmoid colon - staple line is distal  COUNTS: Sponge, needle and instrument counts were reported correct x2  FINDINGS: Dense inflammation in sigmoid abutting bladder related to prior significant diverticulitis. Small bowel adhesions into pelvis and sigmoid colon that were lysed but not densely adherent per se. 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 The bladder was back filled until tense - approximately 400 mL of dilute methylene blue. There was no extravasation of blue dye. ICG perfusion test demonstrated fluorescence of the colon out to the level of the divided mesentery. The rectal stump also had great perfusion. The anastomosis was created on the proximal rectum given the amount of sigmoid disease she had which had extended onto the uppermost portion of rectum.  The anastomosis is without any tension and rests 14 cm from the anal verge by flexible sigmoidoscopy.   STATEMENT OF MEDICAL NECESSITY: [name] is a very pleasant 61yoF with hx of Sjogren syndrome (managed with hydroxychloroquine, no steroids), asthma who presented to the hospital 09/2019 with abdominal pain. She is  worked up and found to have a leukocytosis and abdominal tenderness on exam. She underwent CT scan of the abdomen and pelvis which showed sigmoid diverticulitis with microperforation and a few blips of gas in the mesentery and the dome of the bladder with suspicion for colovesical fistula. She at that time had noted having at least a year worth of symptoms regarding pneumaturia. She hadn't had any prior known history of diverticulitis though. She was admitted hospital where she had a relatively protracted course and was discharged on 02/08/2019 on oral antibiotics. She had bloating and nausea and was readmitted 02/14/2019 at which time she had a CT scan which demonstrated inflammatory changes around the proximal sigmoid colon which was slightly worsened in appearance as well as a reactive ileus/partial small bowel traction with some secondarily inflamed/enteritis appearing small intestine on her scan. She was placed on Cipro/Flagyl and admitted hospital. With this regimen, she improved. She was discharged 02/19/2019 and seen back for follow-up. She denies any significant complaints at this time and doing reasonably well. Her colonoscopy was obtained from her outside endoscopy facility and was done as recently as 2018 - hyperplastic polyp removed, tortuous colon and pandiverticulosis.  Options had been reviewed with her and she opted to pursue surgical intervention. Please refer to notes elsewhere for details regarding this discussion.  NARRATIVE: Informed consent was verified. She was then taken to the operating room, placed supine on the operating table and SCD's were applied. General endotracheal anesthesia was induced. The patient was then positioned in the lithotomy position with Allen stirrups. Pressure points were then inspected and padded. She was prepped and draped for placement of ureteral stents and cystoscopy - Dr. Jeffie Pollock then did this, please refer to his notes for details. 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 Antibiotics had been administered.  The patient's abdomen was then prepped and draped in the standard sterile fashion. Surgical timeout confirmed our plan.    An OG tube was placed and confirmed to be to suction.  At Palmer's point, the Veress needle was inserted into the peritoneal cavity.  Intraperitoneal location was confirmed using the aspiration and saline drop test.  Insufflation commenced to maximum pressure of 15 mmHg using CO2.  Following this, attention was turned to placing the 8 mm blunt tipped trocar in the right upper quadrant.  This was done carefully.  The laparoscope was inserted and confirmed no evidence of trocar site or Veress needle site complications.  The Veress needle was removed.  The abdomen was surveyed.  Small bowel adhesions were noted in the low midline area at the site of her prior Pfannenstiel and additionally sigmoid diverticulitis.  The sigmoid colon was densely adherent to the dome of the bladder on the left upper portion of the bladder.  Additional trocar sites were marked out in a line extending from the right ASIS up towards the left upper quadrant.  3 additional 8 mm trochars were placed under direct visualization.  An additional 5 mm assist port was placed in the right lateral abdomen under direct visualization.  Bilateral transversus abdominis plane blocks was then performed using a dilute mixture of Exparel and 0.25% Marcaine with epinephrine.  Using laparoscopic scissors, some of the small bowel adhesions to the anterior abdominal wall overlying her prior Pfannenstiel were carefully taken down sharply.  Following this, she was positioned in Trendelenburg with some left side tilt up.  The robot was then docked.  I then went to the console.  Using scissors, sharp dissection commenced taking down the adhesions of the small bowel and omentum from the abdominal wall where her diverticular disease and additionally prior surgery was.  This was done carefully.  The small  bowel at this point was reinspected and noted to be free of any injuries.  It had separated nicely from the colon and there is no evidence of any fistulas to the small bowel.  Sigmoid colon was then taken down sharply off of the bladder, staying within a plane that was hugging the colon and in some locations subserosal and the colon.  This was done so as to protect the bladder from any injury.  After the colon was mobilized off of the bladder, the bladder was inspected and grossly there is no evidence of any holes in the bladder.  There were additional adhesions of the sigmoid colon onto itself where it was quite redundant entering the pelvis.  These were carefully separated sharply.  Ultimately, the sigmoid was mobilized out of the pelvis.  Attention was turned to a medial approach.  The rectosigmoid colon was grasped and elevated anteriorly.  The peritoneum overlying the sacral promontory was then incised sharply.  The avascular plane between the presacral fascia and the mesorectum was identified and gained.  This plane was developed part way down into the pelvis.  This plane was then continued cephalad.  The IMA pedicle was identified.  The peritoneum overlying it was incised.  Staying within the avascular plane beneath the vessel, the plane was further developed.  The left ureter was readily identified and protected free of injury.  The gonadal vessels were also identified and protected free of injury.  This plane of dissection commenced up to the left kidney effectively mobilizing the descending mesocolon off of the underlying retroperitoneal structures.  Attention  was then turned back to the IMA pedicle.  The IMA was circumferentially dissected in a somewhat distal location given that this was for diverticular disease.  After the vessel had been circumferentially dissected, orientation of the left ureter was again confirmed to be well away from our field of dissection.  The IMA was then divided using the  vessel sealer.  The pedicle was inspected and noted to be hemostatic.  The extent of her disease ended at the location of the IMA pedicle.  The colon proximal to this was soft and normal in appearance.  The mesentery was divided out to the level of planned transection proximally.  Following this, attention was turned to the pelvis.  The location where the tinea had splayed, overlying the sacral promontory, and where there were no appendices epiploica was identified.  This corresponded at the proximal rectum.  The mesorectum at this level was then cleared using the vessel sealer.  2 of the robotic arms were then undocked.  A 12 mm robotic trocar was placed approximately 2 fingerbreadths above the pubic symphysis well above where the bladder was located.  This was done under laparoscopic visualization.  The robotic stapler was then docked.  A 45 mm green load stapler was selected.  This was placed across the proximal rectum, closed, held, and then fired.  The rectum was effectively divided and one firing of the stapler.  After this had been completely cleared, attention was turned to checking the bladder.  The bladder was backfilled with a dilute solution of methylene blue.  This was filled to the level at which the bladder was nicely distended and corresponded to approximately 400 mL of fluid.  The area of the presumed fistula was carefully inspected and there is no evident extravasation of blue dye.  The catheter was placed back to gravity drainage.  Attention was then turned to performing a perfusion test.  ICG was instilled by anesthesia.  The colon was inspected.  At the level of the divided mesentery, there was good perfusion to the proximal colon.  The rectal stump was inspected and also noted to have avidity of ICG and healthy in appearance.  Attention was then turned to the extracorporeal portion of the procedure.  The robot was undocked.  A Pfannenstiel incision was created at the location of her prior  incision.  Subcutaneous tissue was divided electrocautery.  The anterior rectus sheath at this level was incised.  This was grasped with Kocher clamps.  Flaps were raised cephalad and caudad.  The peritoneum in the midline was then incised under pneumoperitoneum.  The peritoneum was opened taking care to preserve the bladder free of injury.  An Emma wound protector was placed.  The stapled end of the rectosigmoid colon was then delivered.  The planned site of proximal transection was identified.  At this level, the colon was soft and pliable.  A pursestring device was placed.  A 2-0 Prolene on a Keith needle was passed.  The specimen was then divided and passed off with the staple line being distal.  The pursestring device was removed.  EEA sizers were passed and a 29 mm EEA stapler was selected.  3-0 silk "belt loops" were then placed around the pursestring.  The mucosa at this level was pink and well perfused.  The 29 mm anvil was placed and the pursestring was tied.  A small amount of fatty tissue was cleared.  This was then passed back into the abdomen.  An Alexis wound  protector Was placed.  Pneumoperitoneum was reestablished.  I then went below.  Under direct visualization, sizers were passed up the rectum and the 29 mm sizer passed any difficulty.  The 29 mm stapler was then passed.  The spike was deployed just anterior to the staple line.  The components were mated.  Orientation was then confirmed such that there was no twisting of the colon or small bowel underneath the mesenteric defect.  The components were closed under visualization again confirming no twisting.  There is no vagina within the staple line either.  The stapler was held and then fired.  The donuts were then inspected and noted to be complete.  The colon proximal to the anastomosis was gently occluded in the pelvis was filled with sterile saline.  I then passed the flexible sigmoidoscope.  The rectum and colon proximally anastomosis were  normal in appearance.  The anastomosis was pink, patent, and airtight.  Insufflation was then evacuated from the rectum.  The anastomosis was measured and found to be approximately 14 cm from the anal verge.  Irrigation from the pelvis was evacuated.  The bladder despite being tense with methylene blue demonstrated no evidence of extravasation.  Given this, no "formal" repair was deemed necessary and no drain was placed. Trochars were removed under direct visualization and the port sites are noted to be hemostatic.  The wound protector was removed.  Attention was turned to the clean portion of the procedure.  All scrubbed participants changed gowns and gloves.  Equipment was exchanged for clean equipment.  Additional sterile drapes were placed around the operative field.  Omentum was then brought down into the pelvis.  Hemostasis was again verified.  The peritoneum in the Pfannenstiel was closed with a running 2-0 Vicryl suture under direct visualization.  The fascia was then approximated using 2 running #1 PDS sutures.  The 12 mm trocar had been placed through the Pfannenstiel and the defect had been incorporated in our initial Pfannenstiel fascial opening.  The skin of all incision sites was then approximate using 4-0 Monocryl subcuticular suture.  Dermabond was placed over all wounds.  A honeycomb dressing was placed over the Pfannenstiel incision.  The ureteral stents were removed and noted to be intact. They foley catheter was left in place.  She was then taken out of lithotomy and returned to the supine position, awakened from anesthesia, extubated, and transferred to a stretcher for transport to PACU in satisfactory condition.  DISPOSITION: PACU in satisfactory condition

## 2019-05-22 NOTE — H&P (Signed)
I was asked to place ureteral catheters prior to colon resection for diverticulitis with abscess and colovesical fistula.   She has no prior GU surgery.    I have reviewed the chart, labs and imaging.  I have reviewed the risks of the procedure including bleeding, infection and difficulty placing stents with possible ureteral injury.

## 2019-05-23 ENCOUNTER — Encounter (HOSPITAL_COMMUNITY): Payer: Self-pay | Admitting: Surgery

## 2019-05-23 LAB — CBC
HCT: 35.9 % — ABNORMAL LOW (ref 36.0–46.0)
Hemoglobin: 11.2 g/dL — ABNORMAL LOW (ref 12.0–15.0)
MCH: 27.7 pg (ref 26.0–34.0)
MCHC: 31.2 g/dL (ref 30.0–36.0)
MCV: 88.6 fL (ref 80.0–100.0)
Platelets: 209 10*3/uL (ref 150–400)
RBC: 4.05 MIL/uL (ref 3.87–5.11)
RDW: 14.3 % (ref 11.5–15.5)
WBC: 12.2 10*3/uL — ABNORMAL HIGH (ref 4.0–10.5)
nRBC: 0 % (ref 0.0–0.2)

## 2019-05-23 LAB — BASIC METABOLIC PANEL
Anion gap: 7 (ref 5–15)
BUN: 11 mg/dL (ref 8–23)
CO2: 27 mmol/L (ref 22–32)
Calcium: 8.4 mg/dL — ABNORMAL LOW (ref 8.9–10.3)
Chloride: 106 mmol/L (ref 98–111)
Creatinine, Ser: 0.99 mg/dL (ref 0.44–1.00)
GFR calc Af Amer: >60 mL/min (ref >60)
GFR calc non Af Amer: 57 mL/min — ABNORMAL LOW (ref >60)
Glucose, Bld: 105 mg/dL — ABNORMAL HIGH (ref 70–99)
Potassium: 3.7 mmol/L (ref 3.5–5.1)
Sodium: 140 mmol/L (ref 135–145)

## 2019-05-23 MED ORDER — GABAPENTIN 600 MG PO TABS
300.0000 mg | ORAL_TABLET | Freq: Three times a day (TID) | ORAL | Status: DC
  Filled 2019-05-23: qty 0.5

## 2019-05-23 MED ORDER — GABAPENTIN 300 MG PO CAPS
300.0000 mg | ORAL_CAPSULE | Freq: Three times a day (TID) | ORAL | Status: DC
  Administered 2019-05-23 – 2019-05-25 (×7): 300 mg via ORAL
  Filled 2019-05-23 (×6): qty 1

## 2019-05-23 MED ORDER — OXYCODONE HCL 5 MG PO TABS: 5.0000 mg | ORAL_TABLET | Freq: Four times a day (QID) | ORAL | 0 refills | Status: AC | PRN

## 2019-05-23 MED ORDER — OXYCODONE HCL 5 MG PO TABS
5.0000 mg | ORAL_TABLET | Freq: Four times a day (QID) | ORAL | Status: DC | PRN
  Administered 2019-05-23 – 2019-05-24 (×2): 10 mg via ORAL
  Administered 2019-05-24: 5 mg via ORAL
  Filled 2019-05-23: qty 2
  Filled 2019-05-23: qty 1
  Filled 2019-05-23: qty 2

## 2019-05-23 NOTE — Progress Notes (Signed)
OT Cancellation Note  Patient Details Name: Madoline Bhatt MRN: [id number] DOB: 03-Jan-1946   Cancelled Treatment:    Reason Eval/Treat Not Completed: OT screened, no needs identified, will sign off.  Pt performing ADLs mod I.    Lucille Passy, OTR/L Acute Rehabilitation Services Pager [phone] Office [phone]   Lucille Passy M 05/23/2019, 1:35 PM

## 2019-05-23 NOTE — Progress Notes (Signed)
Subjective No acute events. Has some back pain - mostly along right side of back that she woke up from surgery with. States she has back pain at baseline and lying still for a couple hours always sets it off. Abdominal soreness but controlled. Denies n/v. Tolerating clear liquids without difficulty. Denies flatus/BM  Objective: Vital signs in last 24 hours: Temp:  [96.8 F (36 C)-99.1 F (37.3 C)] 98.7 F (37.1 C) (08/07 0536) Pulse Rate:  [58-88] 77 (08/07 0536) Resp:  [12-18] 16 (08/07 0536) BP: (126-179)/(59-90) 145/63 (08/07 0536) SpO2:  [96 %-100 %] 96 % (08/07 0536) Last BM Date: 05/22/19  Intake/Output from previous day: 08/06 0701 - 08/07 0700 In: 4803.3 [P.O.:240; I.V.:4563.3] Out: 1600 [Urine:1500; Blood:100] Intake/Output this shift: No intake/output data recorded.  Gen: NAD, comfortable CV: RRR Pulm: Normal work of breathing Abd: Soft, appropriately ttp around incisions; incisions c/d/i without drainage or erythema. Ext: SCDs in place  Lab Results: CBC  Recent Labs    05/20/19 1610 05/23/19 0304  WBC 6.9 12.2*  HGB 12.2 11.2*  HCT 39.4 35.9*  PLT 296 209   BMET Recent Labs    05/20/19 1610 05/23/19 0304  NA 141 140  K 4.3 3.7  CL 108 106  CO2 24 27  GLUCOSE 102* 105*  BUN 20 11  CREATININE 0.71 0.99  CALCIUM 9.3 8.4*   PT/INR Recent Labs    05/20/19 1610  LABPROT 12.0  INR 0.9   ABG No results for input(s): PHART, HCO3 in the last 72 hours.  Invalid input(s): PCO2, PO2  Studies/Results:  Anti-infectives: Anti-infectives (From admission, onward)   Start     Dose/Rate Route Frequency Ordered Stop   05/22/19 2315  hydroxychloroquine (PLAQUENIL) tablet 200 mg     200 mg Oral 2 times daily 05/22/19 2301     05/22/19 1400  neomycin (MYCIFRADIN) tablet 1,000 mg  Status:  Discontinued     1,000 mg Oral 3 times per day 05/22/19 0552 05/22/19 0555   05/22/19 1400  metroNIDAZOLE (FLAGYL) tablet 1,000 mg  Status:  Discontinued     1,000 mg  Oral 3 times per day 05/22/19 0552 05/22/19 0555   05/22/19 0600  cefoTEtan (CEFOTAN) 2 g in sodium chloride 0.9 % 100 mL IVPB     2 g 200 mL/hr over 30 Minutes Intravenous On call to O.R. 05/22/19 0552 05/22/19 0745       Assessment/Plan: Patient Active Problem List   Diagnosis Date Noted  . Status post laparoscopic-assisted sigmoidectomy 05/22/2019  . 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 Bowel perforation ([place])   . Asthma 01/26/2019  . Sjogren's syndrome ([place]) 01/26/2019  . Diverticulitis of large intestine with perforation 01/25/2019  . 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 Dema Severin, M.D. [facility]

## 2019-05-23 NOTE — Discharge Instructions (Signed)
POST OP INSTRUCTIONS AFTER COLON SURGERY  1. DIET: Be sure to include lots of fluids daily to stay hydrated - 64oz of water per day (8, 8 oz glasses).  Avoid fast food or heavy meals for the first couple of weeks as your are more likely to get nauseated. Avoid raw/uncooked fruits or vegetables for the first 4 weeks (its ok to have these if they are blended into smoothie form). If you have fruits/vegetables, make sure they are cooked until soft enough to mash on the roof of your mouth and chew your food well. Otherwise, diet as tolerated.  2. Take your usually prescribed home medications unless otherwise directed.  3. PAIN CONTROL: a. Pain is best controlled by a usual combination of three different methods TOGETHER: i. Ice/Heat ii. Over the counter pain medication iii. Prescription pain medication b. Most patients will experience some swelling and bruising around the surgical site.  Ice packs or heating pads (30-60 minutes up to 6 times a day) will help. Some people prefer to use ice alone, heat alone, alternating between ice & heat.  Experiment to what works for you.  Swelling and bruising can take several weeks to resolve.   c. It is helpful to take an over-the-counter pain medication regularly for the first few weeks: i. Ibuprofen (Motrin/Advil) - 200mg tabs - take 3 tabs (600mg) every 6 hours as needed for pain (unless you have been directed previously to avoid NSAIDs/ibuprofen) ii. Acetaminophen (Tylenol) - you may take 650mg every 6 hours as needed. You can take this with motrin as they act differently on the body. If you are taking a narcotic pain medication that has acetaminophen in it, do not take over the counter tylenol at the same time. iii. NOTE: You may take both of these medications together - most patients  find it most helpful when alternating between the two (i.e. Ibuprofen at 6am, tylenol at 9am, ibuprofen at 12pm ...) d. A  prescription for pain medication should be given to you  upon discharge.  Take your pain medication as prescribed if your pain is not adequatly controlled with the over-the-counter pain reliefs mentioned above.  4. Avoid getting constipated.  Between the surgery and the pain medications, it is common to experience some constipation.  Increasing fluid intake and taking a fiber supplement (such as Metamucil, Citrucel, FiberCon, MiraLax, etc) 1-2 times a day regularly will usually help prevent this problem from occurring.  A mild laxative (prune juice, Milk of Magnesia, MiraLax, etc) should be taken according to package directions if there are no bowel movements after 48 hours.    5. Dressing: Your incisions are covered in Dermabond which is like sterile superglue for the skin. This will come off on it's own in a couple weeks. It is waterproof and you may bathe normally starting the day after your surgery in a shower. Avoid baths/pools/lakes/oceans until your wounds have fully healed.  6. ACTIVITIES as tolerated:   a. Avoid heavy lifting (>10lbs or 1 gallon of milk) for the next 6 weeks. b. You may resume regular daily activities as tolerated--such as daily self-care, walking, climbing stairs--gradually increasing activities as tolerated.  If you can walk 30 minutes without difficulty, it is safe to try more intense activity such as jogging, treadmill, bicycling, low-impact aerobics.  c. DO NOT PUSH THROUGH PAIN.  Let pain be your guide: If it hurts to do something, don't do it. d. You may drive when you are no longer taking prescription pain medication, you   can comfortably wear a seatbelt, and you can safely maneuver your car and apply brakes.  7. FOLLOW UP in our office a. Please call [facility] at [phone] to set up an appointment to see your surgeon in the office for a follow-up appointment approximately 2 weeks after your surgery. b. Make sure that you call for this appointment the day you arrive home to insure a convenient appointment time.  9. If you  have disability or family leave forms that need to be completed, you may have them completed by your primary care physician's office; for return to work instructions, please ask our office staff and they will be happy to assist you in obtaining this documentation   When to call us [phone]: 1. Poor pain control 2. Reactions / problems with new medications (rash/itching, etc)  3. Fever over 101.5 F (38.5 C) 4. Inability to urinate 5. Nausea/vomiting 6. Worsening swelling or bruising 7. Continued bleeding from incision. 8. Increased pain, redness, or drainage from the incision  The clinic staff is available to answer your questions during regular business hours (8:30am-5pm).  Please don't hesitate to call and ask to speak to one of our nurses for clinical concerns.   A surgeon from [facility] is always on call at the hospitals   If you have a medical emergency, go to the nearest emergency room or call 911.  [facility] [address], [address], [place], [place]  [address] MAIN: [phone] FAX: [phone] [email or web address] 

## 2019-05-23 NOTE — Evaluation (Signed)
Physical Therapy One Time Evaluation Patient Details Name: Denise Payne MRN: [id number] DOB: [birth year] Today's Date: 05/23/2019   History of Present Illness  Pt is a 73 year old female s/p Robotic-assisted low anterior resection with double stapled colorectal anastomosis and robotic lysis of adhesions due to sigmoid diverticulitis on 05/22/19  Clinical Impression  Patient evaluated by Physical Therapy with no further acute PT needs identified. All education has been completed and the patient has no further questions.  Pt mobilizing well and agreeable to ambulate 5x daily during acute stay.  Pt does not anticipate needing RW upon d/c.  See below for any follow-up Physical Therapy or equipment needs. PT is signing off. Thank you for this referral.     Follow Up Recommendations No PT follow up    Equipment Recommendations  None recommended by PT    Recommendations for Other Services       Precautions / Restrictions Precautions Precautions: None      Mobility  Bed Mobility Overal bed mobility: Independent                Transfers Overall transfer level: Independent                  Ambulation/Gait Ambulation/Gait assistance: Supervision;Modified independent (Device/Increase time) Gait Distance (Feet): 400 Feet Assistive device: Rolling walker (2 wheeled) Gait Pattern/deviations: Step-through pattern;Decreased stride length     General Gait Details: steady with RW, utilized RW for support POD #1 however pt states she will not need upon d/c  Stairs            Wheelchair Mobility    Modified Rankin (Stroke Patients Only)       Balance Overall balance assessment: Needs assistance(denies any recent falls)         Standing balance support: No upper extremity supported Standing balance-Leahy Scale: Fair Standing balance comment: static fair                             Pertinent Vitals/Pain Pain Assessment: Faces Faces Pain Scale:  Hurts a little bit Pain Location: chronic back pain however improved with mobility Pain Descriptors / Indicators: Sore;Aching Pain Intervention(s): Repositioned;Monitored during session    Home Living Family/patient expects to be discharged to:: Private residence Living Arrangements: Alone Available Help at Discharge: Family;Available 24 hours/day Type of Home: House Home Access: Level entry     Home Layout: Able to live on main level with bedroom/bathroom Home Equipment: None      Prior Function Level of Independence: Independent               Hand Dominance        Extremity/Trunk Assessment        Lower Extremity Assessment Lower Extremity Assessment: Overall WFL for tasks assessed    Cervical / Trunk Assessment Cervical / Trunk Assessment: Normal  Communication   Communication: No difficulties  Cognition Arousal/Alertness: Awake/alert Behavior During Therapy: WFL for tasks assessed/performed Overall Cognitive Status: Within Functional Limits for tasks assessed                                        General Comments      Exercises     Assessment/Plan    PT Assessment Patent does not need any further PT services  PT Problem List  PT Treatment Interventions      PT Goals (Current goals can be found in the Care Plan section)  Acute Rehab PT Goals PT Goal Formulation: All assessment and education complete, DC therapy    Frequency     Barriers to discharge        Co-evaluation               AM-PAC PT "6 Clicks" Mobility  Outcome Measure Help needed turning from your back to your side while in a flat bed without using bedrails?: None Help needed moving from lying on your back to sitting on the side of a flat bed without using bedrails?: None Help needed moving to and from a bed to a chair (including a wheelchair)?: None Help needed standing up from a chair using your arms (e.g., wheelchair or bedside chair)?:  None Help needed to walk in hospital room?: A Little Help needed climbing 3-5 steps with a railing? : A Little 6 Click Score: 22    End of Session   Activity Tolerance: Patient tolerated treatment well Patient left: in chair;with call bell/phone within reach   PT Visit Diagnosis: Difficulty in walking, not elsewhere classified (R26.2)    Time: 1586-8257 PT Time Calculation (min) (ACUTE ONLY): 11 min   Charges:   PT Evaluation $PT Eval Low Complexity: [place], PT, DPT Acute Rehabilitation Services Office: [phone] Pager: [phone]  Trena Platt 05/23/2019, 12:06 PM

## 2019-05-23 NOTE — Anesthesia Postprocedure Evaluation (Signed)
Anesthesia Post Note  Patient: Denise Payne  Procedure(s) Performed: FLEXIBLE SIGMOIDOSCOPY (N/A ) XI ROBOTIC ASSISTED SIGMOIDECTOMY (Bilateral Abdomen) CYSTOSCOPY WITH STENT PLACEMENT (N/A )     Patient location during evaluation: PACU Anesthesia Type: General Level of consciousness: awake and alert Pain management: pain level controlled Vital Signs Assessment: post-procedure vital signs reviewed and stable Respiratory status: spontaneous breathing, nonlabored ventilation, respiratory function stable and patient connected to nasal cannula oxygen Cardiovascular status: blood pressure returned to baseline and stable Postop Assessment: no apparent nausea or vomiting Anesthetic complications: no    Last Vitals:  Vitals:   05/22/19 2055 05/23/19 0536  BP: (!) 171/59 (!) 145/63  Pulse: 88 77  Resp: 16 16  Temp: 37.3 C 37.1 C  SpO2: 98% 96%    Last Pain:  Vitals:   05/23/19 0536  TempSrc: Oral  PainSc:                  Anastasia Tompson S

## 2019-05-24 LAB — BASIC METABOLIC PANEL
Anion gap: 6 (ref 5–15)
BUN: 13 mg/dL (ref 8–23)
CO2: 25 mmol/L (ref 22–32)
Calcium: 8 mg/dL — ABNORMAL LOW (ref 8.9–10.3)
Chloride: 107 mmol/L (ref 98–111)
Creatinine, Ser: 0.73 mg/dL (ref 0.44–1.00)
GFR calc Af Amer: >60 mL/min (ref >60)
GFR calc non Af Amer: >60 mL/min (ref >60)
Glucose, Bld: 93 mg/dL (ref 70–99)
Potassium: 3.4 mmol/L — ABNORMAL LOW (ref 3.5–5.1)
Sodium: 138 mmol/L (ref 135–145)

## 2019-05-24 LAB — CBC
HCT: 32.5 % — ABNORMAL LOW (ref 36.0–46.0)
Hemoglobin: 10 g/dL — ABNORMAL LOW (ref 12.0–15.0)
MCH: 27.5 pg (ref 26.0–34.0)
MCHC: 30.8 g/dL (ref 30.0–36.0)
MCV: 89.5 fL (ref 80.0–100.0)
Platelets: 203 10*3/uL (ref 150–400)
RBC: 3.63 MIL/uL — ABNORMAL LOW (ref 3.87–5.11)
RDW: 14.2 % (ref 11.5–15.5)
WBC: 10.2 10*3/uL (ref 4.0–10.5)
nRBC: 0 % (ref 0.0–0.2)

## 2019-05-24 NOTE — Progress Notes (Signed)
Foley discontinued. Pt tolerated with no problems. VWilliams,RN.

## 2019-05-24 NOTE — Progress Notes (Signed)
Subjective Doing well.  Having LLQ pain.  Tolerating a diet, having bowel function.  Objective: Vital signs in last 24 hours: Temp:  [97.6 F (36.4 C)-99 F (37.2 C)] 98.2 F (36.8 C) (08/08 0526) Pulse Rate:  [66-73] 73 (08/08 0526) Resp:  [15-18] 17 (08/08 0526) BP: (112-160)/(49-88) 119/88 (08/08 0526) SpO2:  [97 %-100 %] 97 % (08/08 0526) Weight:  [75.5 kg] 75.5 kg (08/08 0526) Last BM Date: 05/23/19  Intake/Output from previous day: 08/07 0701 - 08/08 0700 In: 960 [P.O.:960] Out: 1375 [Urine:1375] Intake/Output this shift: No intake/output data recorded.  Gen: NAD, comfortable Pulm: Normal work of breathing Abd: Soft, appropriately ttp around incisions; incisions c/d/i without drainage or erythema. Ext: SCDs in place  Lab Results: CBC  Recent Labs    05/23/19 0304 05/24/19 0331  WBC 12.2* 10.2  HGB 11.2* 10.0*  HCT 35.9* 32.5*  PLT 209 203   BMET Recent Labs    05/23/19 0304 05/24/19 0331  NA 140 138  K 3.7 3.4*  CL 106 107  CO2 27 25  GLUCOSE 105* 93  BUN 11 13  CREATININE 0.99 0.73  CALCIUM 8.4* 8.0*   PT/INR No results for input(s): LABPROT, INR in the last 72 hours. ABG No results for input(s): PHART, HCO3 in the last 72 hours.  Invalid input(s): PCO2, PO2  Studies/Results:  Anti-infectives: Anti-infectives (From admission, onward)   Start     Dose/Rate Route Frequency Ordered Stop   05/22/19 2315  hydroxychloroquine (PLAQUENIL) tablet 200 mg     200 mg Oral 2 times daily 05/22/19 2301     05/22/19 1400  neomycin (MYCIFRADIN) tablet 1,000 mg  Status:  Discontinued     1,000 mg Oral 3 times per day 05/22/19 0552 05/22/19 0555   05/22/19 1400  metroNIDAZOLE (FLAGYL) tablet 1,000 mg  Status:  Discontinued     1,000 mg Oral 3 times per day 05/22/19 0552 05/22/19 0555   05/22/19 0600  cefoTEtan (CEFOTAN) 2 g in sodium chloride 0.9 % 100 mL IVPB     2 g 200 mL/hr over 30 Minutes Intravenous On call to O.R. 05/22/19 0552 05/22/19 0745        Assessment/Plan: Patient Active Problem List   Diagnosis Date Noted  . Status post laparoscopic-assisted sigmoidectomy 05/22/2019  . 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 Bowel perforation ([place])   . Asthma 01/26/2019  . Sjogren's syndrome ([place]) 01/26/2019  . Diverticulitis of large intestine with perforation 01/25/2019  . 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

## 2019-05-25 LAB — BASIC METABOLIC PANEL
Anion gap: 6 (ref 5–15)
BUN: 11 mg/dL (ref 8–23)
CO2: 26 mmol/L (ref 22–32)
Calcium: 8.3 mg/dL — ABNORMAL LOW (ref 8.9–10.3)
Chloride: 109 mmol/L (ref 98–111)
Creatinine, Ser: 0.67 mg/dL (ref 0.44–1.00)
GFR calc Af Amer: >60 mL/min (ref >60)
GFR calc non Af Amer: >60 mL/min (ref >60)
Glucose, Bld: 95 mg/dL (ref 70–99)
Potassium: 3.5 mmol/L (ref 3.5–5.1)
Sodium: 141 mmol/L (ref 135–145)

## 2019-05-25 LAB — CBC
HCT: 33 % — ABNORMAL LOW (ref 36.0–46.0)
Hemoglobin: 9.9 g/dL — ABNORMAL LOW (ref 12.0–15.0)
MCH: 27.4 pg (ref 26.0–34.0)
MCHC: 30 g/dL (ref 30.0–36.0)
MCV: 91.4 fL (ref 80.0–100.0)
Platelets: 222 10*3/uL (ref 150–400)
RBC: 3.61 MIL/uL — ABNORMAL LOW (ref 3.87–5.11)
RDW: 14.1 % (ref 11.5–15.5)
WBC: 7.4 10*3/uL (ref 4.0–10.5)
nRBC: 0 % (ref 0.0–0.2)

## 2019-05-25 MED ORDER — GABAPENTIN 300 MG PO CAPS: 300.0000 mg | ORAL_CAPSULE | Freq: Three times a day (TID) | ORAL | 0 refills | Status: DC | PRN

## 2019-05-25 NOTE — Discharge Summary (Signed)
Physician Discharge Summary  Patient ID: Denise Payne MRN: [id number] DOB/AGE: [birth year] 74 y.o.  Admit date: 05/22/2019 Discharge date: 05/25/2019  Admission Diagnoses: colovesical fistula  Discharge Diagnoses:  Active Problems:   Status post laparoscopic-assisted sigmoidectomy   Discharged Condition: good  Hospital Course: 73 year old female status post robotic assisted sigmoidectomy for colovesicular fistula.  She was noted not to have an active fistula intraoperatively.  Postoperatively she was sent to the floor in stable condition.  Her diet was advanced as tolerated.  Her activity was also advanced as tolerated.  She began to have bowel function on postop day 2.  She was felt to be in stable condition for discharge on postop day 3.  Consults: None  Significant Diagnostic Studies: labs: cbc, bmet  Treatments: IV hydration and surgery: see above  Discharge Exam: Blood pressure (!) 144/57, pulse 72, temperature 97.8 F (36.6 C), temperature source Oral, resp. rate 18, height 5\' 2"  (1.575 m), weight 76.2 kg, SpO2 100 %. General appearance: alert and cooperative GI: normal findings: soft, non-tender Incision/Wound: clean, dry, intact  Disposition: home   Allergies as of 05/25/2019      Reactions   Latex Shortness Of Breath, Swelling   Lip and eye swelling   Morphine And Related Other (See Comments)   Makes her cramp in abdomen   Onion Diarrhea, Other (See Comments)   Stomach cramps      Medication List    TAKE these medications   albuterol 108 (90 Base) MCG/ACT inhaler Commonly known as: VENTOLIN HFA Inhale 2 puffs into the lungs every 6 (six) hours as needed for wheezing or shortness of breath.   aspirin EC 81 MG tablet Take 81 mg by mouth every evening.   cyclobenzaprine 10 MG tablet Commonly known as: FLEXERIL Take 10 mg by mouth 3 (three) times daily as needed for muscle spasms.   diclofenac sodium 1 % Gel Commonly known as: VOLTAREN Apply 2 g topically  2 (two) times daily as needed (pain).   esomeprazole 40 MG capsule Commonly known as: NEXIUM Take 40 mg by mouth daily.   gabapentin 300 MG capsule Commonly known as: NEURONTIN Take 1 capsule (300 mg total) by mouth 3 (three) times daily as needed.   hydroxychloroquine 200 MG tablet Commonly known as: PLAQUENIL Take 200 mg by mouth 2 (two) times daily.   Melatonin 10 MG Tabs Take 10 mg by mouth at bedtime as needed (sleep).   montelukast 10 MG tablet Commonly known as: SINGULAIR Take 10 mg by mouth at bedtime.   naproxen sodium 220 MG tablet Commonly known as: ALEVE Take 440 mg by mouth daily as needed (pain).   oxyCODONE 5 MG immediate release tablet Commonly known as: Roxicodone Take 1 tablet (5 mg total) by mouth every 6 (six) hours as needed for up to 7 days (severe postop pain not controlled with tylenol/ibuprofen). What changed:   when to take this  reasons to take this   PROBIOTIC PO Take 1 tablet by mouth daily.   vitamin B-12 1000 MCG tablet Commonly known as: CYANOCOBALAMIN Take 1,000 mcg by mouth daily.   Vitamin D 50 MCG (2000 UT) tablet Take 2,000 Units by mouth daily.      Follow-up Information    Ileana Roup, MD. Schedule an appointment as soon as possible for a visit.   Specialty: General Surgery Why: as scheduled Contact information: [place] [address] [phone]           Signed: Elmo Putt  Marijo Conception 05/25/2019, 7:54 AM

## 2019-05-25 NOTE — Progress Notes (Signed)
Nurse reviewed discharge instructions with pt.  Pt verbalized understanding of discharge instructions, follow up appointment and new medications.  Prescriptions were electronically sent to pt's pharmacy. No concerns at time of discharge.

## 2019-06-06 DIAGNOSIS — M3500 Sicca syndrome, unspecified: Secondary | ICD-10-CM | POA: Diagnosis not present

## 2019-06-06 DIAGNOSIS — Z1211 Encounter for screening for malignant neoplasm of colon: Secondary | ICD-10-CM | POA: Diagnosis not present

## 2019-06-06 DIAGNOSIS — K219 Gastro-esophageal reflux disease without esophagitis: Secondary | ICD-10-CM | POA: Diagnosis not present

## 2019-06-06 DIAGNOSIS — K5720 Diverticulitis of large intestine with perforation and abscess without bleeding: Secondary | ICD-10-CM | POA: Diagnosis not present

## 2019-06-06 DIAGNOSIS — Z0000 Encounter for general adult medical examination without abnormal findings: Secondary | ICD-10-CM | POA: Diagnosis not present

## 2019-06-06 DIAGNOSIS — Z13220 Encounter for screening for lipoid disorders: Secondary | ICD-10-CM | POA: Diagnosis not present

## 2019-06-06 DIAGNOSIS — J4520 Mild intermittent asthma, uncomplicated: Secondary | ICD-10-CM | POA: Diagnosis not present

## 2019-06-06 DIAGNOSIS — Z1389 Encounter for screening for other disorder: Secondary | ICD-10-CM | POA: Diagnosis not present

## 2019-06-06 DIAGNOSIS — Z1231 Encounter for screening mammogram for malignant neoplasm of breast: Secondary | ICD-10-CM | POA: Diagnosis not present

## 2019-06-06 DIAGNOSIS — Z136 Encounter for screening for cardiovascular disorders: Secondary | ICD-10-CM | POA: Diagnosis not present

## 2019-07-26 DIAGNOSIS — Z23 Encounter for immunization: Secondary | ICD-10-CM | POA: Diagnosis not present

## 2019-08-08 ENCOUNTER — Encounter (INDEPENDENT_AMBULATORY_CARE_PROVIDER_SITE_OTHER): Payer: Self-pay

## 2019-09-05 DIAGNOSIS — Z20828 Contact with and (suspected) exposure to other viral communicable diseases: Secondary | ICD-10-CM | POA: Diagnosis not present

## 2019-10-13 ENCOUNTER — Ambulatory Visit: Payer: Medicare Other | Attending: Internal Medicine

## 2019-10-13 DIAGNOSIS — Z20822 Contact with and (suspected) exposure to covid-19: Secondary | ICD-10-CM

## 2019-10-13 DIAGNOSIS — Z20828 Contact with and (suspected) exposure to other viral communicable diseases: Secondary | ICD-10-CM | POA: Diagnosis not present

## 2019-10-15 LAB — NOVEL CORONAVIRUS, NAA: SARS-CoV-2, NAA: NOT DETECTED

## 2019-10-24 LAB — BASIC METABOLIC PANEL: Creatinine: 0.8 (ref <=1.1)

## 2020-03-12 ENCOUNTER — Other Ambulatory Visit: Payer: Self-pay | Admitting: Family Medicine

## 2020-03-12 ENCOUNTER — Encounter: Payer: Self-pay | Admitting: Family Medicine

## 2020-03-12 ENCOUNTER — Ambulatory Visit (INDEPENDENT_AMBULATORY_CARE_PROVIDER_SITE_OTHER): Payer: Medicare Other | Admitting: Family Medicine

## 2020-03-12 ENCOUNTER — Other Ambulatory Visit: Payer: Self-pay

## 2020-03-12 VITALS — BP 158/70 | HR 77 | Temp 97.5°F | Ht 62.0 in | Wt 163.4 lb

## 2020-03-12 DIAGNOSIS — N959 Unspecified menopausal and perimenopausal disorder: Secondary | ICD-10-CM | POA: Diagnosis not present

## 2020-03-12 DIAGNOSIS — L989 Disorder of the skin and subcutaneous tissue, unspecified: Secondary | ICD-10-CM | POA: Diagnosis not present

## 2020-03-12 DIAGNOSIS — R030 Elevated blood-pressure reading, without diagnosis of hypertension: Secondary | ICD-10-CM | POA: Diagnosis not present

## 2020-03-12 DIAGNOSIS — G8929 Other chronic pain: Secondary | ICD-10-CM | POA: Diagnosis not present

## 2020-03-12 DIAGNOSIS — M8580 Other specified disorders of bone density and structure, unspecified site: Secondary | ICD-10-CM | POA: Insufficient documentation

## 2020-03-12 DIAGNOSIS — M25561 Pain in right knee: Secondary | ICD-10-CM | POA: Diagnosis not present

## 2020-03-12 DIAGNOSIS — K219 Gastro-esophageal reflux disease without esophagitis: Secondary | ICD-10-CM | POA: Insufficient documentation

## 2020-03-12 DIAGNOSIS — M810 Age-related osteoporosis without current pathological fracture: Secondary | ICD-10-CM | POA: Insufficient documentation

## 2020-03-12 DIAGNOSIS — Z1231 Encounter for screening mammogram for malignant neoplasm of breast: Secondary | ICD-10-CM

## 2020-03-12 MED ORDER — CYCLOBENZAPRINE HCL 10 MG PO TABS: 10.0000 mg | ORAL_TABLET | Freq: Three times a day (TID) | ORAL | 0 refills | Status: DC | PRN

## 2020-03-12 NOTE — Progress Notes (Signed)
Patient: Denise Payne MRN: [id number] DOB: [birth year] PCP: Orma Flaming, MD     Subjective:  Chief Complaint  Patient presents with  . Establish Care    New Patient  . right knee pain  . Hypertension    HPI: The patient is a 74 y.o. female who presents today to establish care. Pt says that she wanted to establish care else where because she did not like her previous PCP. She has history of Sjogren's syndrome followed by rheumatology, hx of diverticulitis s/p sigmoidectomy, mild asthma and OA. She has concerns for her blood pressure as she feels like she has intermittently high blood pressures. She had no issues before her bout of diverticulitis.   Right knee pain:  She has had chronic knee pain with known OA. Her last xray was over 3 years ago and was told she wasn't ready for knee replacement. She states pain comes and goes. She is on plaquenil for her SS and isn't sure if this helps.   She was [facility] and she thinks she had labs done in December and her annual medicare was in December.   Would like a referral to dermatology   HM -need mmg -UTD on tetanus-will try to find records.  -colonoscopy: she states she was told she no longer needs due to her age -bone scan: >3 years. Hx of osteopenia -hep C screen: she thinks she has had this done.  -pneumonia shot: she has had both pneumonia shots.    Review of Systems  Constitutional: Negative for chills, fatigue and fever.  Cardiovascular: Negative for chest pain and palpitations.  Gastrointestinal: Negative for abdominal pain, nausea and vomiting.  Genitourinary: Negative for frequency, pelvic pain and urgency.  Neurological: Negative for dizziness, light-headedness and headaches.    Allergies Patient is allergic to latex; morphine and related; and onion.  Past Medical History Patient  has a past medical history of Asthma and Sjogren's syndrome ([place]) (01/26/2019).  Surgical History Patient  has a past surgical  history that includes Augmentation mammaplasty (Bilateral, 10/16/1976); Flexible sigmoidoscopy (N/A, 05/22/2019); XI robotic assisted lower anterior resection (Bilateral, 05/22/2019); and Cystoscopy with stent placement (N/A, 05/22/2019).  Family History Pateint's family history includes Breast cancer (age of onset: 29) in her mother.  Social History Patient  reports that she has never smoked. She has never used smokeless tobacco. She reports current alcohol use of about 1.0 standard drinks of alcohol per week. She reports that she does not use drugs.    Objective: Vitals:   03/12/20 1317 03/12/20 1357  BP: (!) 144/70 (!) 158/70  Pulse: 77   Temp: (!) 97.5 F (36.4 C)   TempSrc: Temporal   SpO2: 97%   Weight: 163 lb 6.4 oz (74.1 kg)   Height: 5\' 2"  (1.575 m)     Body mass index is 29.89 kg/m.  Physical Exam Vitals reviewed.  Constitutional:      Appearance: Normal appearance. She is obese.  HENT:     Head: Normocephalic and atraumatic.     Right Ear: Tympanic membrane, ear canal and external ear normal.     Left Ear: Tympanic membrane, ear canal and external ear normal.     Nose: Nose normal.     Mouth/Throat:     Mouth: Mucous membranes are moist.  Eyes:     Extraocular Movements: Extraocular movements intact.     Pupils: Pupils are equal, round, and reactive to light.  Cardiovascular:     Rate and Rhythm: Normal rate and regular  rhythm.     Heart sounds: Normal heart sounds.  Pulmonary:     Effort: Pulmonary effort is normal.     Breath sounds: Normal breath sounds.  Abdominal:     General: Bowel sounds are normal.     Palpations: Abdomen is soft.  Musculoskeletal:     Cervical back: Normal range of motion and neck supple.  Skin:    General: Skin is warm.     Capillary Refill: Capillary refill takes less than 2 seconds.  Neurological:     General: No focal deficit present.     Mental Status: She is alert and oriented to person, place, and time.  Psychiatric:         Mood and Affect: Mood normal.        Behavior: Behavior normal.      Office Visit from 03/12/2020 in [facility]  PHQ-2 Total Score  0         Assessment/plan: -requesting records for her labs/HM.   1. Elevated blood pressure reading She is going to keep a log for me x 1 month. Already tries to do low salt diet. trying to keep weight off. Encouraged exercise. See her back in one month and if still elevated will start medication.   2. Encounter for screening mammogram for malignant neoplasm of breast  - MM Digital Screening; Future  3. Menopausal and postmenopausal disorder  - DG Bone Density; Future  4. Chronic pain of right knee Known OA of her knee. Has had injections. Isn't ready for imaging at this point. Well let me know and continue voltaren gel.   5. Lesion of face  - Ambulatory referral to Dermatology    This visit occurred during the SARS-CoV-2 public health emergency.  Safety protocols were in place, including screening questions prior to the visit, additional usage of staff PPE, and extensive cleaning of exam room while observing appropriate contact time as indicated for disinfecting solutions.     Return in about 1 month (around 04/12/2020) for blood pressure check up-bring log. Orma Flaming, MD [place]   03/12/2020

## 2020-03-12 NOTE — Patient Instructions (Signed)
1) please get records for me so I can look at labs and put in her your preventative health stuff  2) let me know when ready for knee xray  3) referral done to dermatology. They will call you with this.   4) blood pressure is high. I want you to keep a log and see me back in 1-3 months. Low salt diet and exercise. If running high, come in sooner than later.   5) mammogram and bone scan ordered as well.   So nice to meet you!  Dr. Rogers Blocker

## 2020-03-16 ENCOUNTER — Encounter: Payer: Self-pay | Admitting: Family Medicine

## 2020-03-30 DIAGNOSIS — M150 Primary generalized (osteo)arthritis: Secondary | ICD-10-CM | POA: Diagnosis not present

## 2020-03-30 DIAGNOSIS — Z6829 Body mass index (BMI) 29.0-29.9, adult: Secondary | ICD-10-CM | POA: Diagnosis not present

## 2020-03-30 DIAGNOSIS — M5136 Other intervertebral disc degeneration, lumbar region: Secondary | ICD-10-CM | POA: Diagnosis not present

## 2020-03-30 DIAGNOSIS — E663 Overweight: Secondary | ICD-10-CM | POA: Diagnosis not present

## 2020-03-30 DIAGNOSIS — M3500 Sicca syndrome, unspecified: Secondary | ICD-10-CM | POA: Diagnosis not present

## 2020-03-30 DIAGNOSIS — M25561 Pain in right knee: Secondary | ICD-10-CM | POA: Diagnosis not present

## 2020-04-12 ENCOUNTER — Ambulatory Visit (INDEPENDENT_AMBULATORY_CARE_PROVIDER_SITE_OTHER): Payer: Medicare Other | Admitting: Family Medicine

## 2020-04-12 ENCOUNTER — Other Ambulatory Visit: Payer: Self-pay

## 2020-04-12 ENCOUNTER — Encounter: Payer: Self-pay | Admitting: Family Medicine

## 2020-04-12 VITALS — BP 178/70 | HR 75 | Temp 98.0°F | Ht 62.0 in | Wt 168.8 lb

## 2020-04-12 DIAGNOSIS — I10 Essential (primary) hypertension: Secondary | ICD-10-CM | POA: Diagnosis not present

## 2020-04-12 MED ORDER — LISINOPRIL 20 MG PO TABS: 20.0000 mg | ORAL_TABLET | Freq: Every day | ORAL | 1 refills | Status: DC

## 2020-04-12 NOTE — Progress Notes (Signed)
Patient: Denise Payne MRN: [id number] DOB: 04-May-1946 PCP: Orma Flaming, MD     Subjective:  Chief Complaint  Patient presents with  . Hypertension    HPI: The patient is a 74 y.o. female who presents today for Hypertension. I have made a copy of blood pressure log. She states starting red yeast rice and wants to know if this okay to take. I saw her a month ago and asked her to keep a log for me. Discussed  if elevated we would start medication. She has brought her log for me to review. She uses a wrist cuff. Continues to be asymptomatic. She can hear her pulse in the left ear at times, mainly when she goes to bed. No formal exercise.   Average readings: -128-195/69-95. Average 150-160/85  She also stopped her aleve at night and it didn't make any difference.   Review of Systems  Eyes: Negative for visual disturbance.  Respiratory: Negative for cough, shortness of breath and wheezing.   Cardiovascular: Negative for chest pain and palpitations.  Gastrointestinal: Negative for abdominal pain, nausea and vomiting.  Neurological: Negative for dizziness, light-headedness and headaches.    Allergies Patient is allergic to latex, morphine and related, and onion.  Past Medical History Patient  has a past medical history of Anemia, Asthma, Cataract, GERD (gastroesophageal reflux disease), Osteoporosis, and Sjogren's syndrome ([place]) (01/26/2019).  Surgical History Patient  has a past surgical history that includes Augmentation mammaplasty (Bilateral, 10/16/1976); Flexible sigmoidoscopy (N/A, 05/22/2019); XI robotic assisted lower anterior resection (Bilateral, 05/22/2019); Cystoscopy with stent placement (N/A, 05/22/2019); Ankle fracture surgery (2005); and Abdominal hysterectomy (1996).  Family History Pateint's family history includes Arthritis in her maternal grandmother and paternal grandmother; Birth defects in her brother; Breast cancer (age of onset: 59) in her mother; Cancer in her  brother, father, maternal grandmother, and sister; Diabetes in her maternal grandfather; Early death in her maternal grandfather, maternal grandmother, paternal grandfather, and paternal grandmother; Hearing loss in her father, maternal grandfather, mother, and paternal grandmother; Heart attack in her maternal grandfather; Heart disease in her maternal grandfather and mother; High Cholesterol in her mother; Hyperlipidemia in her brother, paternal grandmother, and sister; Hypertension in her mother and sister; Kidney disease in her paternal grandfather; Stroke in her father; Transient ischemic attack in her father.  Social History Patient  reports that she has never smoked. She has never used smokeless tobacco. She reports current alcohol use of about 8.0 standard drinks of alcohol per week. She reports that she does not use drugs.    Objective: Vitals:   04/12/20 1418 04/12/20 1449  BP: 140/70 (!) 178/70  Pulse: 75   Temp: 98 F (36.7 C)   TempSrc: Temporal   SpO2: 93%   Weight: 168 lb 12.8 oz (76.6 kg)   Height: 5\' 2"  (1.575 m)     Body mass index is 30.87 kg/m.  Physical Exam Vitals reviewed.  Constitutional:      Appearance: Normal appearance. She is obese.  HENT:     Head: Normocephalic and atraumatic.  Cardiovascular:     Rate and Rhythm: Normal rate and regular rhythm.     Heart sounds: Normal heart sounds.  Pulmonary:     Effort: Pulmonary effort is normal.     Breath sounds: Normal breath sounds.  Abdominal:     General: Bowel sounds are normal.     Palpations: Abdomen is soft.  Neurological:     General: No focal deficit present.     Mental Status: She  is alert and oriented to person, place, and time.  Psychiatric:        Mood and Affect: Mood normal.        Behavior: Behavior normal.      ekg in chart and reviewed.     Assessment/plan: 1. Benign essential HTN Readings on average are above goal. Starting her on lisinopril 20mg , but will have her start with  1/2 pill for 1-2 weeks. Parameters given to increase to full pill if >150/90. She is slightly labile so discussed im okay if she runs slightly above goal. I also want her to get an arm cuff instead of using a wrist cuff.  Side effects of ACE-I discussed including dry cough and angioedema. She is to call me if feels like she has a dry cough and they are to call 911 or go to ER if any signs/symptoms of angioedema.    This visit occurred during the SARS-CoV-2 public health emergency.  Safety protocols were in place, including screening questions prior to the visit, additional usage of staff PPE, and extensive cleaning of exam room while observing appropriate contact time as indicated for disinfecting solutions.    Return in about 6 weeks (around 05/24/2020) for blood pressure check. Orma Flaming, MD [place]   04/12/2020

## 2020-04-12 NOTE — Patient Instructions (Signed)
Going to start you on lisinopril. I want you to cut pill in half for 1-2 weeks then can bump up to a full pill if blood pressure is still >150/90.   Side effects of ACE-I: dry cough is common. Angioedema (swelling of lips and throat) can happen and if this happens go to ER or call 911.   See you back in 6 weeks. Get an arm cuff too! More accurate than the wrist.

## 2020-05-24 ENCOUNTER — Other Ambulatory Visit: Payer: Self-pay | Admitting: Family Medicine

## 2020-05-24 DIAGNOSIS — E2839 Other primary ovarian failure: Secondary | ICD-10-CM

## 2020-05-26 ENCOUNTER — Other Ambulatory Visit: Payer: Self-pay

## 2020-05-26 ENCOUNTER — Other Ambulatory Visit: Payer: Self-pay | Admitting: Family Medicine

## 2020-05-26 ENCOUNTER — Ambulatory Visit
Admission: RE | Admit: 2020-05-26 | Discharge: 2020-05-26 | Disposition: A | Discharge status: Home / Self Care | Payer: Medicare Other | Source: Ambulatory Visit | Attending: Family Medicine | Admitting: Family Medicine

## 2020-05-26 ENCOUNTER — Encounter: Payer: Self-pay | Admitting: Family Medicine

## 2020-05-26 ENCOUNTER — Ambulatory Visit (INDEPENDENT_AMBULATORY_CARE_PROVIDER_SITE_OTHER): Payer: Medicare Other | Admitting: Family Medicine

## 2020-05-26 VITALS — BP 180/80 | HR 84 | Temp 97.2°F | Ht 62.0 in | Wt 167.8 lb

## 2020-05-26 DIAGNOSIS — Z1231 Encounter for screening mammogram for malignant neoplasm of breast: Secondary | ICD-10-CM | POA: Diagnosis not present

## 2020-05-26 DIAGNOSIS — E2839 Other primary ovarian failure: Secondary | ICD-10-CM

## 2020-05-26 DIAGNOSIS — Z780 Asymptomatic menopausal state: Secondary | ICD-10-CM | POA: Diagnosis not present

## 2020-05-26 DIAGNOSIS — M8589 Other specified disorders of bone density and structure, multiple sites: Secondary | ICD-10-CM | POA: Diagnosis not present

## 2020-05-26 DIAGNOSIS — I10 Essential (primary) hypertension: Secondary | ICD-10-CM | POA: Diagnosis not present

## 2020-05-26 IMAGING — MG DIGITAL SCREENING IMPLANTS W/ CAD
8 of 10 series · 8 of 10 positions shown · non-contrast
Comparison: Previous exam(s).

CLINICAL DATA: Screening.

EXAM:
DIGITAL SCREENING BILATERAL MAMMOGRAM WITH IMPLANTS AND CAD
The patient has retroglandular implants. Standard and implant
displaced views were performed.

[L CC (1 of 4)]
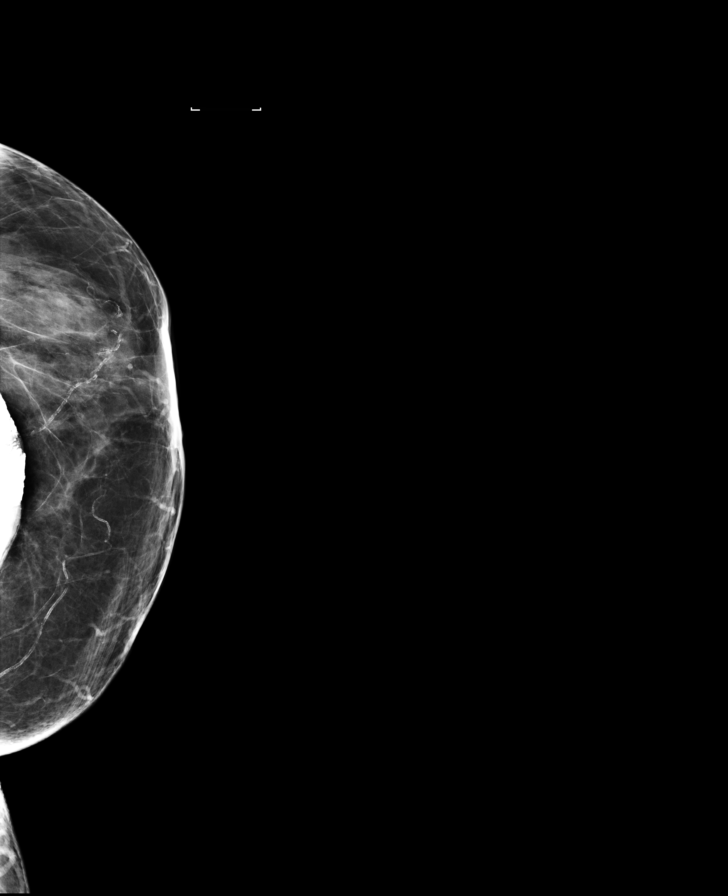

[L CC (2 of 4)]
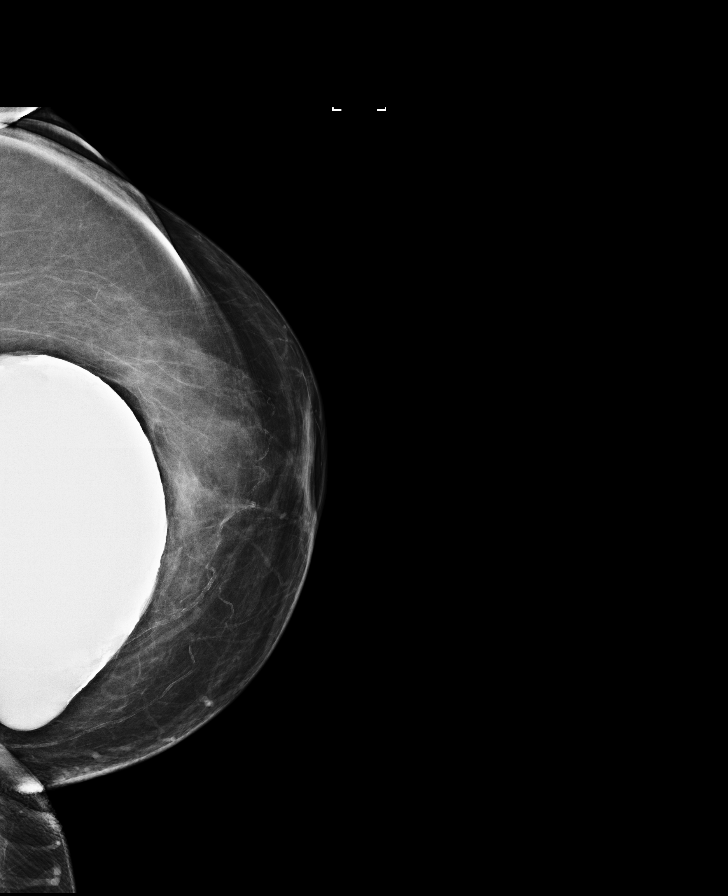

[R CC]
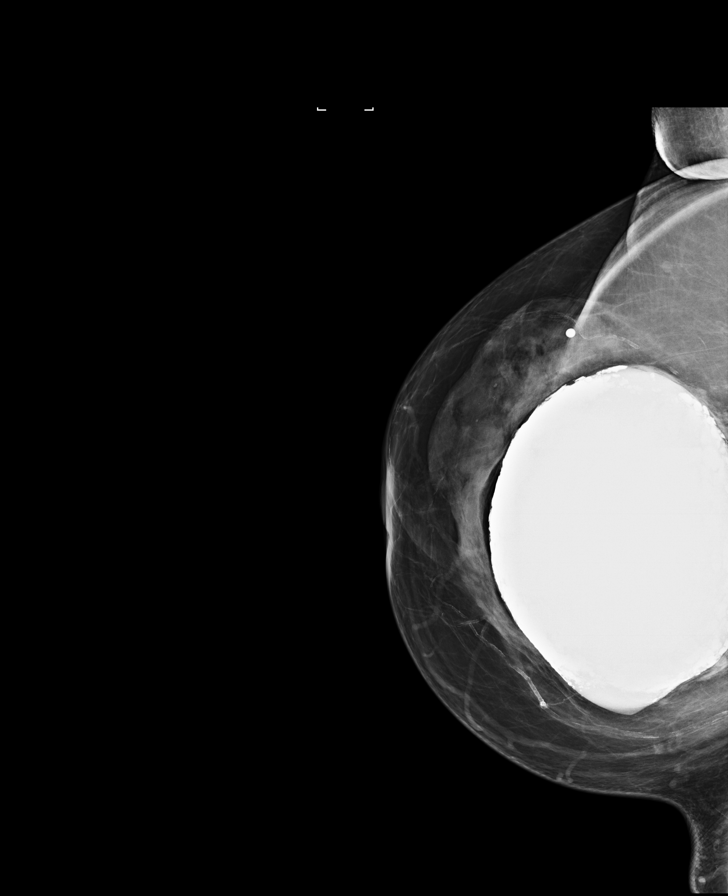

[L MLO (1 of 2)]
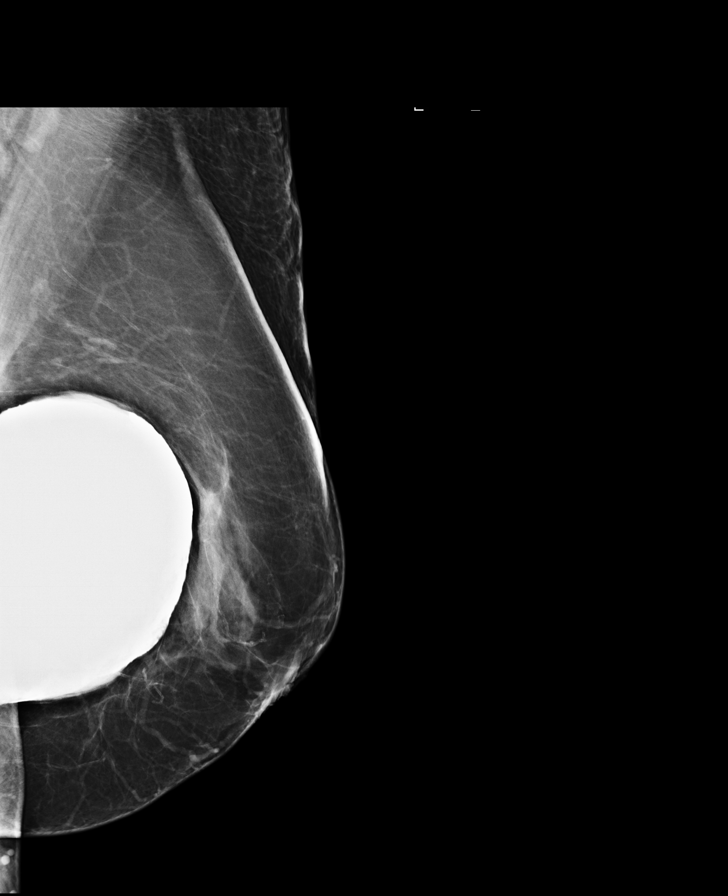

[L CC (3 of 4)]
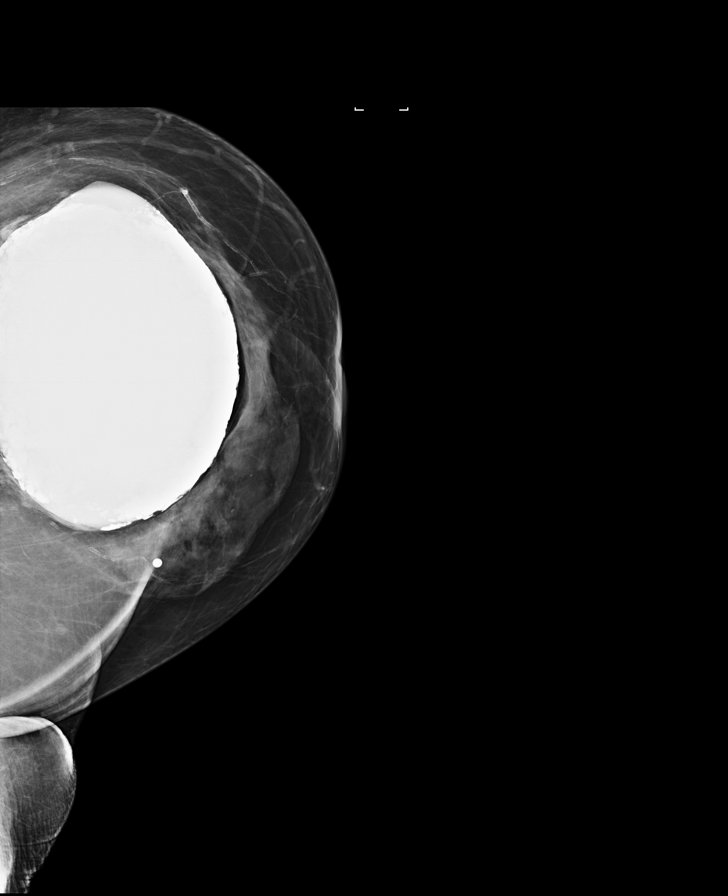

[L MLO (2 of 2)]
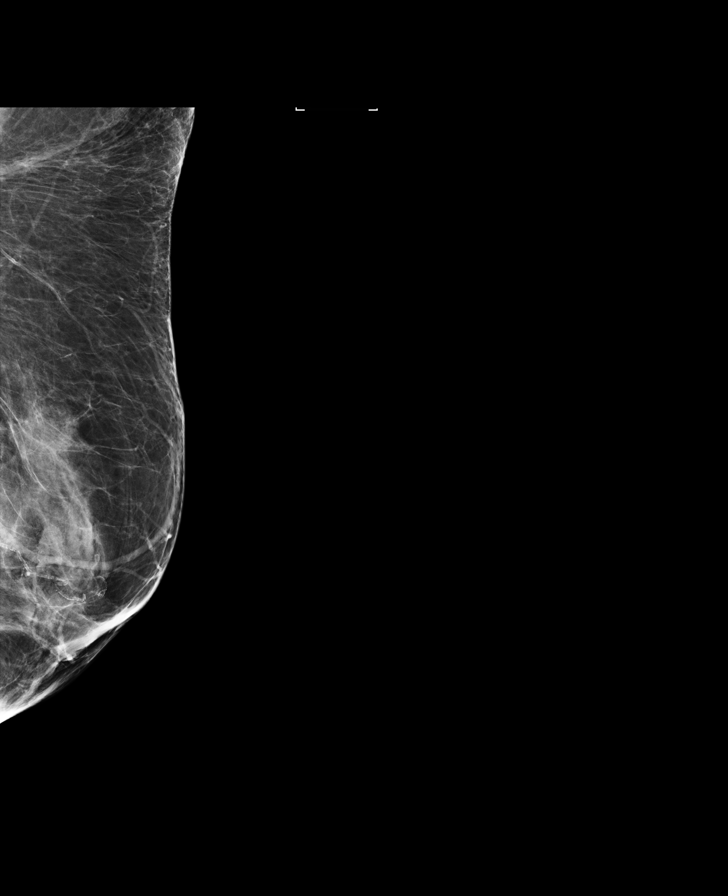

[L CC (4 of 4)]
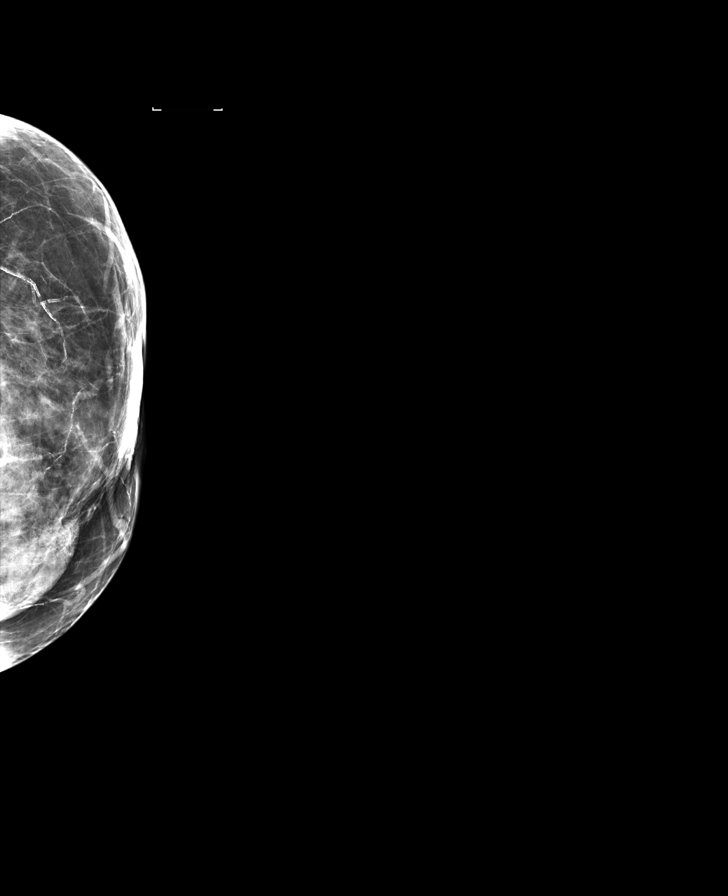

[R MLO]
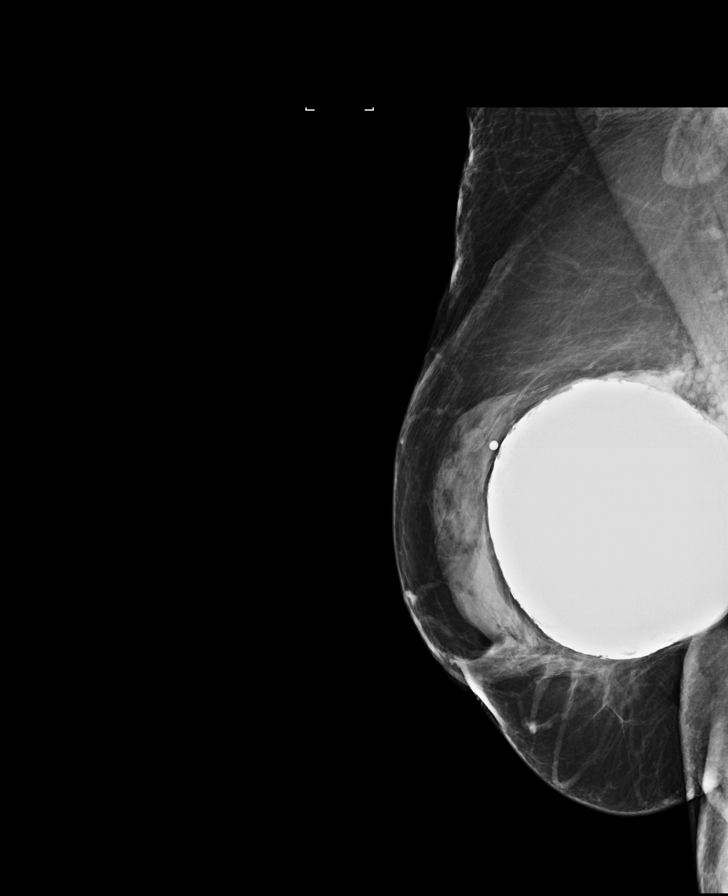

[8 of 10 positions shown; findings below may reference images not displayed]

ACR Breast Density Category c: The breast tissue is heterogeneously
dense, which may obscure small masses.
FINDINGS: There are no findings suspicious for malignancy. Images were
processed with CAD.
IMPRESSION: No mammographic evidence of malignancy. A result letter of this
screening mammogram will be mailed directly to the patient.

RECOMMENDATION:
Screening mammogram in one year. (Code:[DS])

BI-RADS CATEGORY  1:  Negative.

## 2020-05-26 MED ORDER — ESOMEPRAZOLE MAGNESIUM 40 MG PO CPDR: 40.0000 mg | DELAYED_RELEASE_CAPSULE | Freq: Every day | ORAL | 1 refills | Status: DC

## 2020-05-26 MED ORDER — MONTELUKAST SODIUM 10 MG PO TABS: 10.0000 mg | ORAL_TABLET | Freq: Every day | ORAL | 3 refills | Status: DC

## 2020-05-26 NOTE — Progress Notes (Signed)
Patient: Denise Payne MRN: [id number] DOB: 06/24/1946 PCP: Orma Flaming, MD     Subjective:  Chief Complaint  Patient presents with  . Hypertension    HPI: The patient is a 74 y.o. female who presents today for Hypertension. I saw her about 4 weeks ago and we increased her lisinopril to 20mg /day. She has her log with her. Pressures are a little labile. Range from 128-170/63-92. Average is 160/80+ She is still having pounding in her left ear, notices more at night/evening when she is quiet. Denies other symptoms.   Has recent labs from [facility]. All wnl.   Review of Systems  Constitutional: Negative for chills and fatigue.  Eyes: Negative for visual disturbance.  Respiratory: Negative for cough and shortness of breath.   Cardiovascular: Negative for chest pain, palpitations and leg swelling.  Genitourinary: Negative for frequency, pelvic pain and urgency.  Neurological: Negative for dizziness and headaches.    Allergies Patient is allergic to latex, morphine and related, and onion.  Past Medical History Patient  has a past medical history of Anemia, Asthma, Cataract, GERD (gastroesophageal reflux disease), Osteoporosis, and Sjogren's syndrome ([place]) (01/26/2019).  Surgical History Patient  has a past surgical history that includes Augmentation mammaplasty (Bilateral, 10/16/1976); Flexible sigmoidoscopy (N/A, 05/22/2019); XI robotic assisted lower anterior resection (Bilateral, 05/22/2019); Cystoscopy with stent placement (N/A, 05/22/2019); Ankle fracture surgery (2005); and Abdominal hysterectomy (1996).  Family History Pateint's family history includes Arthritis in her maternal grandmother and paternal grandmother; Birth defects in her brother; Breast cancer (age of onset: 41) in her mother; Cancer in her brother, father, maternal grandmother, and sister; Diabetes in her maternal grandfather; Early death in her maternal grandfather, maternal grandmother, paternal grandfather,  and paternal grandmother; Hearing loss in her father, maternal grandfather, mother, and paternal grandmother; Heart attack in her maternal grandfather; Heart disease in her maternal grandfather and mother; High Cholesterol in her mother; Hyperlipidemia in her brother, paternal grandmother, and sister; Hypertension in her mother and sister; Kidney disease in her paternal grandfather; Stroke in her father; Transient ischemic attack in her father.  Social History Patient  reports that she has never smoked. She has never used smokeless tobacco. She reports current alcohol use of about 8.0 standard drinks of alcohol per week. She reports that she does not use drugs.    Objective: Vitals:   05/26/20 1106  BP: (!) 180/80  Pulse: 84  Temp: (!) 97.2 F (36.2 C)  TempSrc: Temporal  SpO2: 97%  Weight: 167 lb 12.8 oz (76.1 kg)  Height: 5\' 2"  (1.575 m)    Body mass index is 30.69 kg/m.  Physical Exam Vitals reviewed.  Constitutional:      Appearance: Normal appearance.  HENT:     Head: Normocephalic and atraumatic.  Cardiovascular:     Rate and Rhythm: Normal rate and regular rhythm.     Heart sounds: Normal heart sounds.  Pulmonary:     Effort: Pulmonary effort is normal.     Breath sounds: Normal breath sounds.  Abdominal:     General: Bowel sounds are normal.     Palpations: Abdomen is soft.  Neurological:     General: No focal deficit present.     Mental Status: She is alert and oriented to person, place, and time.  Psychiatric:        Mood and Affect: Mood normal.        Behavior: Behavior normal.        Assessment/plan: 1. Benign essential HTN Above goal with average  around 160/80+. Increasing her lisinopril to 40mg /day. Continue with log. She will come back in 2 weeks with blood pressure cuff/log for nurse visit and to make sure cuff calibrated correctly. If pressures to goal will send in 40mg  of lisinopril for her.    Return in about 2 weeks (around 06/09/2020) for nurse  visit check for blood pressure .   Orma Flaming, MD [place]   05/26/2020

## 2020-05-26 NOTE — Patient Instructions (Addendum)
We are going to increase your lisinopril to 40mg /day. Since you have 20mg  pills, I would double up on these and take the 40mg /day in the AM. Take your blood pressure about 2 hours or so after this.   -if pressures come down we can send in a 40mg  pill, so please let me know.  Lets do a nurse visit in 2 weeks as well.   So good to see you!  Dr. Rogers Blocker

## 2020-06-16 ENCOUNTER — Other Ambulatory Visit: Payer: Self-pay | Admitting: Family Medicine

## 2020-06-16 ENCOUNTER — Ambulatory Visit: Payer: Medicare Other

## 2020-06-16 ENCOUNTER — Telehealth: Payer: Self-pay | Admitting: Family Medicine

## 2020-06-16 ENCOUNTER — Other Ambulatory Visit: Payer: Self-pay

## 2020-06-16 VITALS — BP 155/77 | HR 78

## 2020-06-16 DIAGNOSIS — I10 Essential (primary) hypertension: Secondary | ICD-10-CM

## 2020-06-16 MED ORDER — LISINOPRIL 40 MG PO TABS: 40.0000 mg | ORAL_TABLET | Freq: Every day | ORAL | 3 refills | Status: DC

## 2020-06-16 NOTE — Telephone Encounter (Signed)
Patient states she just received a refill for lisinopril (ZESTRIL) 40 MG tablet but it needs to go to [place], [place].

## 2020-06-16 NOTE — Progress Notes (Signed)
Blood pressure 138/74 manual. Doing much better on 40mg  lisinopril. Sending in refill of this. Continue with log. F/u in 6 months or sooner if needed.  Orma Flaming, MD [place]

## 2020-06-16 NOTE — Telephone Encounter (Signed)
[facility]  Orma Flaming, MD [place]

## 2020-06-16 NOTE — Progress Notes (Signed)
Pt was here today for B/P check. Her blood pressure reading was 155/77 with machine. Pt sat for 15 minutes, and B/P was retaken by Dr Rogers Blocker 138/64 manually.

## 2020-06-24 DIAGNOSIS — Z20828 Contact with and (suspected) exposure to other viral communicable diseases: Secondary | ICD-10-CM | POA: Diagnosis not present

## 2020-08-04 DIAGNOSIS — Z23 Encounter for immunization: Secondary | ICD-10-CM | POA: Diagnosis not present

## 2020-08-10 DIAGNOSIS — Z85828 Personal history of other malignant neoplasm of skin: Secondary | ICD-10-CM | POA: Diagnosis not present

## 2020-08-10 DIAGNOSIS — D492 Neoplasm of unspecified behavior of bone, soft tissue, and skin: Secondary | ICD-10-CM | POA: Diagnosis not present

## 2020-08-10 DIAGNOSIS — L570 Actinic keratosis: Secondary | ICD-10-CM | POA: Diagnosis not present

## 2020-08-10 DIAGNOSIS — D485 Neoplasm of uncertain behavior of skin: Secondary | ICD-10-CM | POA: Diagnosis not present

## 2020-08-10 DIAGNOSIS — L821 Other seborrheic keratosis: Secondary | ICD-10-CM | POA: Diagnosis not present

## 2020-08-10 DIAGNOSIS — C4441 Basal cell carcinoma of skin of scalp and neck: Secondary | ICD-10-CM | POA: Diagnosis not present

## 2020-08-20 ENCOUNTER — Ambulatory Visit: Payer: Medicare Other

## 2020-08-26 ENCOUNTER — Ambulatory Visit (INDEPENDENT_AMBULATORY_CARE_PROVIDER_SITE_OTHER): Payer: Medicare Other

## 2020-08-26 DIAGNOSIS — Z0000 Encounter for general adult medical examination without abnormal findings: Secondary | ICD-10-CM

## 2020-08-26 NOTE — Patient Instructions (Addendum)
Denise Payne , Thank you for taking time to come for your Medicare Wellness Visit. I appreciate your ongoing commitment to your health goals. Please review the following plan we discussed and let me know if I can assist you in the future.   Screening recommendations/referrals: Colonoscopy: No longer required Mammogram: Done 05/26/20 Bone Density: Done 05/26/20 Recommended yearly ophthalmology/optometry visit for glaucoma screening and checkup Recommended yearly dental visit for hygiene and checkup  Vaccinations: Influenza vaccine: Up to date done 08/04/20 Pneumococcal vaccine: Pt will bring in documentation on or before March appt Tdap vaccine:Pt will bring in documentation on or before March appt Shingles vaccine Pt will bring in documentation on or before March appt Covid-19:Completed 1/20 & 11/26/19  Advanced directives: Please bring a copy of your health care power of attorney and living will to the office at your convenience.  Conditions/risks identified: Lose weight  Next appointment: Follow up in one year for your annual wellness visit    Preventive Care 65 Years and Older, Female Preventive care refers to lifestyle choices and visits with your health care provider that can promote health and wellness. What does preventive care include?  A yearly physical exam. This is also called an annual well check.  Dental exams once or twice a year.  Routine eye exams. Ask your health care provider how often you should have your eyes checked.  Personal lifestyle choices, including:  Daily care of your teeth and gums.  Regular physical activity.  Eating a healthy diet.  Avoiding tobacco and drug use.  Limiting alcohol use.  Practicing safe sex.  Taking low-dose aspirin every day.  Taking vitamin and mineral supplements as recommended by your health care provider. What happens during an annual well check? The services and screenings done by your health care provider during  your annual well check will depend on your age, overall health, lifestyle risk factors, and family history of disease. Counseling  Your health care provider may ask you questions about your:  Alcohol use.  Tobacco use.  Drug use.  Emotional well-being.  Home and relationship well-being.  Sexual activity.  Eating habits.  History of falls.  Memory and ability to understand (cognition).  Work and work Statistician.  Reproductive health. Screening  You may have the following tests or measurements:  Height, weight, and BMI.  Blood pressure.  Lipid and cholesterol levels. These may be checked every 5 years, or more frequently if you are over 31 years old.  Skin check.  Lung cancer screening. You may have this screening every year starting at age 80 if you have a 30-pack-year history of smoking and currently smoke or have quit within the past 15 years.  Fecal occult blood test (FOBT) of the stool. You may have this test every year starting at age 57.  Flexible sigmoidoscopy or colonoscopy. You may have a sigmoidoscopy every 5 years or a colonoscopy every 10 years starting at age 2.  Hepatitis C blood test.  Hepatitis B blood test.  Sexually transmitted disease (STD) testing.  Diabetes screening. This is done by checking your blood sugar (glucose) after you have not eaten for a while (fasting). You may have this done every 1-3 years.  Bone density scan. This is done to screen for osteoporosis. You may have this done starting at age 55.  Mammogram. This may be done every 1-2 years. Talk to your health care provider about how often you should have regular mammograms. Talk with your health care provider about your test  results, treatment options, and if necessary, the need for more tests. Vaccines  Your health care provider may recommend certain vaccines, such as:  Influenza vaccine. This is recommended every year.  Tetanus, diphtheria, and acellular pertussis (Tdap,  Td) vaccine. You may need a Td booster every 10 years.  Zoster vaccine. You may need this after age 68.  Pneumococcal 13-valent conjugate (PCV13) vaccine. One dose is recommended after age 48.  Pneumococcal polysaccharide (PPSV23) vaccine. One dose is recommended after age 72. Talk to your health care provider about which screenings and vaccines you need and how often you need them. This information is not intended to replace advice given to you by your health care provider. Make sure you discuss any questions you have with your health care provider. Document Released: 10/29/2015 Document Revised: 06/21/2016 Document Reviewed: 08/03/2015 Elsevier Interactive Patient Education  2017 [employer] Prevention in the Home Falls can cause injuries. They can happen to people of all ages. There are many things you can do to make your home safe and to help prevent falls. What can I do on the outside of my home?  Regularly fix the edges of walkways and driveways and fix any cracks.  Remove anything that might make you trip as you walk through a door, such as a raised step or threshold.  Trim any bushes or trees on the path to your home.  Use bright outdoor lighting.  Clear any walking paths of anything that might make someone trip, such as rocks or tools.  Regularly check to see if handrails are loose or broken. Make sure that both sides of any steps have handrails.  Any raised decks and porches should have guardrails on the edges.  Have any leaves, snow, or ice cleared regularly.  Use sand or salt on walking paths during winter.  Clean up any spills in your garage right away. This includes oil or grease spills. What can I do in the bathroom?  Use night lights.  Install grab bars by the toilet and in the tub and shower. Do not use towel bars as grab bars.  Use non-skid mats or decals in the tub or shower.  If you need to sit down in the shower, use a plastic, non-slip  stool.  Keep the floor dry. Clean up any water that spills on the floor as soon as it happens.  Remove soap buildup in the tub or shower regularly.  Attach bath mats securely with double-sided non-slip rug tape.  Do not have throw rugs and other things on the floor that can make you trip. What can I do in the bedroom?  Use night lights.  Make sure that you have a light by your bed that is easy to reach.  Do not use any sheets or blankets that are too big for your bed. They should not hang down onto the floor.  Have a firm chair that has side arms. You can use this for support while you get dressed.  Do not have throw rugs and other things on the floor that can make you trip. What can I do in the kitchen?  Clean up any spills right away.  Avoid walking on wet floors.  Keep items that you use a lot in easy-to-reach places.  If you need to reach something above you, use a strong step stool that has a grab bar.  Keep electrical cords out of the way.  Do not use floor polish or wax that makes  floors slippery. If you must use wax, use non-skid floor wax.  Do not have throw rugs and other things on the floor that can make you trip. What can I do with my stairs?  Do not leave any items on the stairs.  Make sure that there are handrails on both sides of the stairs and use them. Fix handrails that are broken or loose. Make sure that handrails are as long as the stairways.  Check any carpeting to make sure that it is firmly attached to the stairs. Fix any carpet that is loose or worn.  Avoid having throw rugs at the top or bottom of the stairs. If you do have throw rugs, attach them to the floor with carpet tape.  Make sure that you have a light switch at the top of the stairs and the bottom of the stairs. If you do not have them, ask someone to add them for you. What else can I do to help prevent falls?  Wear shoes that:  Do not have high heels.  Have rubber bottoms.  Are  comfortable and fit you well.  Are closed at the toe. Do not wear sandals.  If you use a stepladder:  Make sure that it is fully opened. Do not climb a closed stepladder.  Make sure that both sides of the stepladder are locked into place.  Ask someone to hold it for you, if possible.  Clearly mark and make sure that you can see:  Any grab bars or handrails.  First and last steps.  Where the edge of each step is.  Use tools that help you move around (mobility aids) if they are needed. These include:  Canes.  Walkers.  Scooters.  Crutches.  Turn on the lights when you go into a dark area. Replace any light bulbs as soon as they burn out.  Set up your furniture so you have a clear path. Avoid moving your furniture around.  If any of your floors are uneven, fix them.  If there are any pets around you, be aware of where they are.  Review your medicines with your doctor. Some medicines can make you feel dizzy. This can increase your chance of falling. Ask your doctor what other things that you can do to help prevent falls. This information is not intended to replace advice given to you by your health care provider. Make sure you discuss any questions you have with your health care provider. Document Released: 07/29/2009 Document Revised: 03/09/2016 Document Reviewed: 11/06/2014 Elsevier Interactive Patient Education  2017 [employer].

## 2020-08-26 NOTE — Progress Notes (Addendum)
Virtual Visit via Telephone Note  I connected with  Journie Hornaday on 08/26/20 at  1:00 PM EST by telephone and verified that I am speaking with the correct person using two identifiers.  Medicare Annual Wellness visit completed telephonically due to Covid-19 pandemic.   Persons participating in this call: This Health Coach and this patient.   Location: Patient: Home Provider: Office   I discussed the limitations, risks, security and privacy concerns of performing an evaluation and management service by telephone and the availability of in person appointments. The patient expressed understanding and agreed to proceed.  Unable to perform video visit due to video visit attempted and failed and/or patient does not have video capability.   Some vital signs may be absent or patient reported.   Willette Brace, LPN     Subjective:   Percy Comp is a 74 y.o. female who presents for an Initial Medicare Annual Wellness Visit.  Review of Systems     Cardiac Risk Factors include: advanced age (>57men, >40 women);obesity (BMI >30kg/m2);hypertension     Objective:    There were no vitals filed for this visit. There is no height or weight on file to calculate BMI.  Advanced Directives 08/26/2020 05/22/2019 05/20/2019 02/15/2019 01/25/2019 09/28/2018  Does Patient Have a Medical Advance Directive? Yes Yes Yes No No Yes  Type of Advance Directive Living will Living will [place];Living will - - [place];Living will  Does patient want to make changes to medical advance directive? - No - Patient declined No - Patient declined - - No - Patient declined  Copy of [place] in Chart? - - No - copy requested - - Yes - validated most recent copy scanned in chart (See row information)  Would patient like information on creating a medical advance directive? - - - No - Patient declined No - Patient declined No - Patient declined    Current  Medications (verified) Outpatient Encounter Medications as of 08/26/2020  Medication Sig   aspirin EC 81 MG tablet Take 81 mg by mouth every evening.    BIOTIN PO Take by mouth.   Cholecalciferol (VITAMIN D) 50 MCG (2000 UT) tablet Take 2,000 Units by mouth daily.   cyclobenzaprine (FLEXERIL) 10 MG tablet Take 1 tablet (10 mg total) by mouth 3 (three) times daily as needed for muscle spasms.   diclofenac sodium (VOLTAREN) 1 % GEL Apply 2 g topically 2 (two) times daily as needed (pain).    esomeprazole (NEXIUM) 40 MG capsule Take 1 capsule (40 mg total) by mouth daily.   hydroxychloroquine (PLAQUENIL) 200 MG tablet Take 200 mg by mouth 2 (two) times daily.   lisinopril (ZESTRIL) 40 MG tablet Take 1 tablet (40 mg total) by mouth daily.   Melatonin 10 MG TABS Take 10 mg by mouth at bedtime as needed (sleep).    montelukast (SINGULAIR) 10 MG tablet Take 1 tablet (10 mg total) by mouth at bedtime.   naproxen sodium (ALEVE) 220 MG tablet Take 440 mg by mouth daily as needed (pain).   Probiotic Product (PROBIOTIC PO) Take 1 tablet by mouth daily.   vitamin B-12 (CYANOCOBALAMIN) 1000 MCG tablet Take 1,000 mcg by mouth daily.    albuterol (PROVENTIL HFA;VENTOLIN HFA) 108 (90 Base) MCG/ACT inhaler Inhale 2 puffs into the lungs every 6 (six) hours as needed for wheezing or shortness of breath.  (Patient not taking: Reported on 05/26/2020)   No facility-administered encounter medications on file as  of 08/26/2020.    Allergies (verified) Latex, Morphine and related, and Onion   History: Past Medical History:  Diagnosis Date   Anemia    Asthma    Cataract    GERD (gastroesophageal reflux disease)    Osteoporosis    Sjogren's syndrome ([place]) 01/26/2019   Past Surgical History:  Procedure Laterality Date   ABDOMINAL HYSTERECTOMY  1996   ANKLE FRACTURE SURGERY  2005   AUGMENTATION MAMMAPLASTY Bilateral 10/16/1976   CYSTOSCOPY WITH STENT PLACEMENT N/A 05/22/2019   Procedure:  CYSTOSCOPY WITH STENT PLACEMENT;  Surgeon: Irine Seal, MD;  Location: [facility];  Service: Urology;  Laterality: N/A;   FLEXIBLE SIGMOIDOSCOPY N/A 05/22/2019   Procedure: FLEXIBLE SIGMOIDOSCOPY;  Surgeon: Ileana Roup, MD;  Location: [facility];  Service: General;  Laterality: N/A;   XI ROBOTIC ASSISTED LOWER ANTERIOR RESECTION Bilateral 05/22/2019   Procedure: XI ROBOTIC ASSISTED SIGMOIDECTOMY;  Surgeon: Ileana Roup, MD;  Location: [facility];  Service: General;  Laterality: Bilateral;   Family History  Problem Relation Age of Onset   Breast cancer Mother 83   Hearing loss Mother    Heart disease Mother    High Cholesterol Mother    Hypertension Mother    Cancer Father    Hearing loss Father    Stroke Father    Transient ischemic attack Father    Arthritis Maternal Grandmother    Cancer Maternal Grandmother    Early death Maternal Grandmother    Diabetes Maternal Grandfather    Early death Maternal Grandfather    Hearing loss Maternal Grandfather    Heart attack Maternal Grandfather    Heart disease Maternal Grandfather    Arthritis Paternal Grandmother    Early death Paternal Grandmother    Hearing loss Paternal Grandmother    Hyperlipidemia Paternal Grandmother    Early death Paternal Grandfather    Kidney disease Paternal 63    Cancer Sister    Hyperlipidemia Sister    Hypertension Sister    Birth defects Brother    Cancer Brother    Hyperlipidemia Brother    Social History   Socioeconomic History   Marital status: Widowed    Spouse name: Not on file   Number of children: Not on file   Years of education: Not on file   Highest education level: Not on file  Occupational History   Occupation: retired  Tobacco Use   Smoking status: Never Smoker   Smokeless tobacco: Never Used  Scientific laboratory technician Use: Never used  Substance and Sexual Activity   Alcohol use: Yes    Alcohol/week: 8.0 standard drinks    Types: 8  Glasses of wine per week    Comment: 3 times daily   Drug use: Never   Sexual activity: Not Currently  Other Topics Concern   Not on file  Social History Narrative   Not on file   Social Determinants of Health   Financial Resource Strain: Low Risk    Difficulty of Paying Living Expenses: Not hard at all  Food Insecurity: No Food Insecurity   Worried About Charity fundraiser in the Last Year: Never true   [place] in the Last Year: Never true  Transportation Needs: No Transportation Needs   Lack of Transportation (Medical): No   Lack of Transportation (Non-Medical): No  Physical Activity: Insufficiently Active   Days of Exercise per Week: 4 days   Minutes of Exercise per Session: 30 min  Stress: No Stress  Concern Present   Feeling of Stress : Not at all  Social Connections: Moderately Isolated   Frequency of Communication with Friends and Family: More than three times a week   Frequency of Social Gatherings with Friends and Family: More than three times a week   Attends Religious Services: More than 4 times per year   Active Member of [employer] or Organizations: No   Attends Archivist Meetings: Never   Marital Status: Widowed    Tobacco Counseling Counseling given: Not Answered   Clinical Intake:  Pre-visit preparation completed: Yes  Pain : No/denies pain     BMI - recorded: 30.69 Nutritional Status: BMI > 30  Obese Nutritional Risks: None Diabetes: No  How often do you need to have someone help you when you read instructions, pamphlets, or other written materials from your doctor or pharmacy?: 1 - Never  Diabetic?No  Interpreter Needed?: No  Information entered by :: Charlott Rakes, LPN   Activities of Daily Living In your present state of health, do you have any difficulty performing the following activities: 08/26/2020  Hearing? Y  Vision? N  Difficulty concentrating or making decisions? N  Walking or climbing stairs?  N  Dressing or bathing? N  Doing errands, shopping? N  Preparing Food and eating ? N  Using the Toilet? N  In the past six months, have you accidently leaked urine? Y  Comment urgency at times  Do you have problems with loss of bowel control? N  Managing your Medications? N  Managing your Finances? N  Housekeeping or managing your Housekeeping? N  Some recent data might be hidden    Patient Care Team: Orma Flaming, MD as PCP - General (Family Medicine)  Indicate any recent Medical Services you may have received from other than [facility] providers in the past year (date may be approximate).     Assessment:   This is a routine wellness examination for Nillie.  Hearing/Vision screen  Hearing Screening   125Hz  250Hz  500Hz  1000Hz  2000Hz  3000Hz  4000Hz  6000Hz  8000Hz   Right ear:           Left ear:           Comments: Pt sates loss and hearing aids are on the way  Vision Screening Comments: [facility] for annual eye care   Dietary issues and exercise activities discussed: Current Exercise Habits: Home exercise routine, Type of exercise: walking;Other - see comments (electric bike riding), Time (Minutes): 30, Frequency (Times/Week): 2, Weekly Exercise (Minutes/Week): 60  Goals     Patient Stated     Lose weight      Depression Screen PHQ 2/9 Scores 08/26/2020 05/26/2020 04/12/2020 03/12/2020  PHQ - 2 Score 0 0 0 0    Fall Risk Fall Risk  08/26/2020 05/26/2020 04/12/2020 03/12/2020  Falls in the past year? 0 1 0 0  Number falls in past yr: 0 0 - -  Injury with Fall? 0 1 - -  Comment - Fractured wrist in 2 places. - -  Risk for fall due to : Impaired vision - No Fall Risks No Fall Risks  Follow up Falls prevention discussed - - -    Any stairs in or around the home? Yes  If so, are there any without handrails? No  Home free of loose throw rugs in walkways, pet beds, electrical cords, etc? Yes  Adequate lighting in your home to reduce risk of falls? Yes   ASSISTIVE DEVICES  UTILIZED TO PREVENT FALLS:  Life alert? No  Use of a cane, walker or w/c? No  Grab bars in the bathroom? Yes  Shower chair or bench in shower? No  Elevated toilet seat or a handicapped toilet? Yes   TIMED UP AND GO:  Was the test performed? No .      Cognitive Function:     6CIT Screen 08/26/2020  What Year? 0 points  What month? 0 points  Count back from 20 0 points  Months in reverse 0 points  Repeat phrase 0 points    Immunizations Immunization History  Administered Date(s) Administered   Influenza, High Dose Seasonal PF 01/26/2019   Influenza-Unspecified 08/04/2020   PFIZER SARS-COV-2 Vaccination 11/05/2019, 11/26/2019    TDAP status: Due, Education has been provided regarding the importance of this vaccine. Advised may receive this vaccine at local pharmacy or Health Dept. Aware to provide a copy of the vaccination record if obtained from local pharmacy or Health Dept. Verbalized acceptance and understanding. Pt stated she has received and will bring in documentation at 3/22 visit    Flu Vaccine status: Up to date   Pneumococcal vaccine status: Declined,  Education has been provided regarding the importance of this vaccine but patient still declined. Advised may receive this vaccine at local pharmacy or Health Dept. Aware to provide a copy of the vaccination record if obtained from local pharmacy or Health Dept. Verbalized acceptance and understanding.  Pt stated she has received and will bring in documentation at 3/22 visit   Covid-19 vaccine status: Completed vaccines  Qualifies for Shingles Vaccine? Yes   Zostavax completed Yes   Shingrix Completed?: Yes Pt stated she has received and will bring in documentation at 3/22 visit   Screening Tests Health Maintenance  Topic Date Due   TETANUS/TDAP  12/14/2020 (Originally 06/01/1965)   Hepatitis C Screening  12/14/2020 (Originally 05-23-1946)   INFLUENZA VACCINE  Completed   DEXA SCAN  Completed    COVID-19 Vaccine  Completed   COLONOSCOPY  Discontinued   PNA vac Low Risk Adult  Discontinued    Health Maintenance  There are no preventive care reminders to display for this patient.  Colorectal cancer screening: No longer required.    Mammogram status: Completed 05/26/20. Repeat every year   Bone Density status: Completed 05/26/20. Results reflect: Bone density results: OSTEOPENIA. Repeat every 2 years.    Additional Screening:  Hepatitis C Screening: does qualify  Vision Screening: Recommended annual ophthalmology exams for early detection of glaucoma and other disorders of the eye. Is the patient up to date with their annual eye exam?  Yes  Who is the provider or what is the name of the office in which the patient attends annual eye exams? [facility]   Dental Screening: Recommended annual dental exams for proper oral hygiene  Community Resource Referral / Chronic Care Management: CRR required this visit?  No   CCM required this visit?  No      Plan:     I have personally reviewed and noted the following in the patients chart:    Medical and social history  Use of alcohol, tobacco or illicit drugs   Current medications and supplements  Functional ability and status  Nutritional status  Physical activity  Advanced directives  List of other physicians  Hospitalizations, surgeries, and ER visits in previous 12 months  Vitals  Screenings to include cognitive, depression, and falls  Referrals and appointments  In addition, I have reviewed and discussed with patient certain  preventive protocols, quality metrics, and best practice recommendations. A written personalized care plan for preventive services as well as general preventive health recommendations were provided to patient.     Willette Brace, LPN   [id number]   Nurse Notes: Pt will bring in documentation on or before March appt for vaccination information

## 2020-09-16 DIAGNOSIS — C4441 Basal cell carcinoma of skin of scalp and neck: Secondary | ICD-10-CM | POA: Diagnosis not present

## 2020-09-22 DIAGNOSIS — R5383 Other fatigue: Secondary | ICD-10-CM | POA: Diagnosis not present

## 2020-09-22 DIAGNOSIS — M25561 Pain in right knee: Secondary | ICD-10-CM | POA: Diagnosis not present

## 2020-09-22 DIAGNOSIS — Z6829 Body mass index (BMI) 29.0-29.9, adult: Secondary | ICD-10-CM | POA: Diagnosis not present

## 2020-09-22 DIAGNOSIS — M150 Primary generalized (osteo)arthritis: Secondary | ICD-10-CM | POA: Diagnosis not present

## 2020-09-22 DIAGNOSIS — M3500 Sicca syndrome, unspecified: Secondary | ICD-10-CM | POA: Diagnosis not present

## 2020-09-22 DIAGNOSIS — M5136 Other intervertebral disc degeneration, lumbar region: Secondary | ICD-10-CM | POA: Diagnosis not present

## 2020-09-22 DIAGNOSIS — E663 Overweight: Secondary | ICD-10-CM | POA: Diagnosis not present

## 2020-09-22 LAB — BASIC METABOLIC PANEL
BUN: 23 — AB (ref 4–21)
CO2: 22 (ref 13–22)
Chloride: 106 (ref 99–108)
Glucose: 107
Potassium: 4.7 (ref 3.4–5.3)
Sodium: 143 (ref 137–147)

## 2020-09-22 LAB — CBC AND DIFFERENTIAL
HCT: 36 (ref 36–46)
Hemoglobin: 12.2 (ref 12.0–16.0)
Neutrophils Absolute: 4.8
Platelets: 275 (ref 150–399)
WBC: 7.4

## 2020-09-22 LAB — HEPATIC FUNCTION PANEL
ALT: 15 (ref 7–35)
AST: 17 (ref 13–35)
Alkaline Phosphatase: 78 (ref 25–125)
Bilirubin, Total: 0.2

## 2020-09-22 LAB — COMPREHENSIVE METABOLIC PANEL
Albumin: 4.4 (ref 3.5–5.0)
Calcium: 9.3 (ref 8.7–10.7)
Globulin: 2

## 2020-09-22 LAB — CBC: RBC: 4.15 (ref 3.87–5.11)

## 2020-10-26 ENCOUNTER — Encounter: Payer: Self-pay | Admitting: Family Medicine

## 2020-12-02 ENCOUNTER — Other Ambulatory Visit: Payer: Self-pay | Admitting: Family Medicine

## 2020-12-02 ENCOUNTER — Other Ambulatory Visit: Payer: Self-pay

## 2020-12-06 ENCOUNTER — Emergency Department (HOSPITAL_COMMUNITY): Payer: Medicare Other

## 2020-12-06 ENCOUNTER — Inpatient Hospital Stay (HOSPITAL_COMMUNITY)
Admission: EM | Admit: 2020-12-06 | Discharge: 2020-12-08 | DRG: 419 | Disposition: A | Discharge status: Home / Self Care | Payer: Medicare Other | Attending: General Surgery | Admitting: General Surgery

## 2020-12-06 ENCOUNTER — Other Ambulatory Visit: Payer: Self-pay

## 2020-12-06 DIAGNOSIS — I10 Essential (primary) hypertension: Secondary | ICD-10-CM | POA: Diagnosis present

## 2020-12-06 DIAGNOSIS — K219 Gastro-esophageal reflux disease without esophagitis: Secondary | ICD-10-CM | POA: Diagnosis not present

## 2020-12-06 DIAGNOSIS — Z803 Family history of malignant neoplasm of breast: Secondary | ICD-10-CM

## 2020-12-06 DIAGNOSIS — M3500 Sicca syndrome, unspecified: Secondary | ICD-10-CM | POA: Diagnosis present

## 2020-12-06 DIAGNOSIS — R000 Tachycardia, unspecified: Secondary | ICD-10-CM | POA: Diagnosis not present

## 2020-12-06 DIAGNOSIS — J45909 Unspecified asthma, uncomplicated: Secondary | ICD-10-CM | POA: Diagnosis present

## 2020-12-06 DIAGNOSIS — M810 Age-related osteoporosis without current pathological fracture: Secondary | ICD-10-CM | POA: Diagnosis not present

## 2020-12-06 DIAGNOSIS — Z7982 Long term (current) use of aspirin: Secondary | ICD-10-CM

## 2020-12-06 DIAGNOSIS — R109 Unspecified abdominal pain: Secondary | ICD-10-CM

## 2020-12-06 DIAGNOSIS — Z79899 Other long term (current) drug therapy: Secondary | ICD-10-CM

## 2020-12-06 DIAGNOSIS — K8020 Calculus of gallbladder without cholecystitis without obstruction: Secondary | ICD-10-CM | POA: Diagnosis not present

## 2020-12-06 DIAGNOSIS — Z20822 Contact with and (suspected) exposure to covid-19: Secondary | ICD-10-CM | POA: Diagnosis present

## 2020-12-06 DIAGNOSIS — K5730 Diverticulosis of large intestine without perforation or abscess without bleeding: Secondary | ICD-10-CM | POA: Diagnosis not present

## 2020-12-06 DIAGNOSIS — Z419 Encounter for procedure for purposes other than remedying health state, unspecified: Secondary | ICD-10-CM

## 2020-12-06 DIAGNOSIS — K8000 Calculus of gallbladder with acute cholecystitis without obstruction: Secondary | ICD-10-CM | POA: Diagnosis not present

## 2020-12-06 DIAGNOSIS — K838 Other specified diseases of biliary tract: Secondary | ICD-10-CM | POA: Diagnosis not present

## 2020-12-06 DIAGNOSIS — Z91040 Latex allergy status: Secondary | ICD-10-CM

## 2020-12-06 DIAGNOSIS — K810 Acute cholecystitis: Secondary | ICD-10-CM | POA: Diagnosis not present

## 2020-12-06 DIAGNOSIS — Z8249 Family history of ischemic heart disease and other diseases of the circulatory system: Secondary | ICD-10-CM

## 2020-12-06 DIAGNOSIS — Z823 Family history of stroke: Secondary | ICD-10-CM

## 2020-12-06 LAB — COMPREHENSIVE METABOLIC PANEL
ALT: 14 U/L (ref 0–44)
AST: 24 U/L (ref 15–41)
Albumin: 3.9 g/dL (ref 3.5–5.0)
Alkaline Phosphatase: 52 U/L (ref 38–126)
Anion gap: 10 (ref 5–15)
BUN: 18 mg/dL (ref 8–23)
CO2: 22 mmol/L (ref 22–32)
Calcium: 9.3 mg/dL (ref 8.9–10.3)
Chloride: 107 mmol/L (ref 98–111)
Creatinine, Ser: 0.78 mg/dL (ref 0.44–1.00)
GFR, Estimated: >60 mL/min (ref >60)
Glucose, Bld: 120 mg/dL — ABNORMAL HIGH (ref 70–99)
Potassium: 4 mmol/L (ref 3.5–5.1)
Sodium: 139 mmol/L (ref 135–145)
Total Bilirubin: 0.9 mg/dL (ref 0.3–1.2)
Total Protein: 6.5 g/dL (ref 6.5–8.1)

## 2020-12-06 LAB — RESP PANEL BY RT-PCR (FLU A&B, COVID) ARPGX2
Influenza A by PCR: NEGATIVE
Influenza B by PCR: NEGATIVE
SARS Coronavirus 2 by RT PCR: NEGATIVE

## 2020-12-06 LAB — CBC
HCT: 40 % (ref 36.0–46.0)
Hemoglobin: 12.7 g/dL (ref 12.0–15.0)
MCH: 28.6 pg (ref 26.0–34.0)
MCHC: 31.8 g/dL (ref 30.0–36.0)
MCV: 90.1 fL (ref 80.0–100.0)
Platelets: 305 10*3/uL (ref 150–400)
RBC: 4.44 MIL/uL (ref 3.87–5.11)
RDW: 12.6 % (ref 11.5–15.5)
WBC: 14.9 10*3/uL — ABNORMAL HIGH (ref 4.0–10.5)
nRBC: 0 % (ref 0.0–0.2)

## 2020-12-06 LAB — LIPASE, BLOOD: Lipase: 32 U/L (ref 11–51)

## 2020-12-06 LAB — LACTIC ACID, PLASMA: Lactic Acid, Venous: 1 mmol/L (ref 0.5–1.9)

## 2020-12-06 IMAGING — US US ABDOMEN LIMITED RUQ/ASCITES
1 series · 14 of 25 positions shown · non-contrast
Comparison: Same day CT abdomen pelvis.

CLINICAL DATA: Concern for acute cholecystitis.

EXAM:
ULTRASOUND ABDOMEN LIMITED RIGHT UPPER QUADRANT

[Series 1: us abdomen limited ruq (liver/gb) · 14 of 34 slices shown]
[im 1/34]
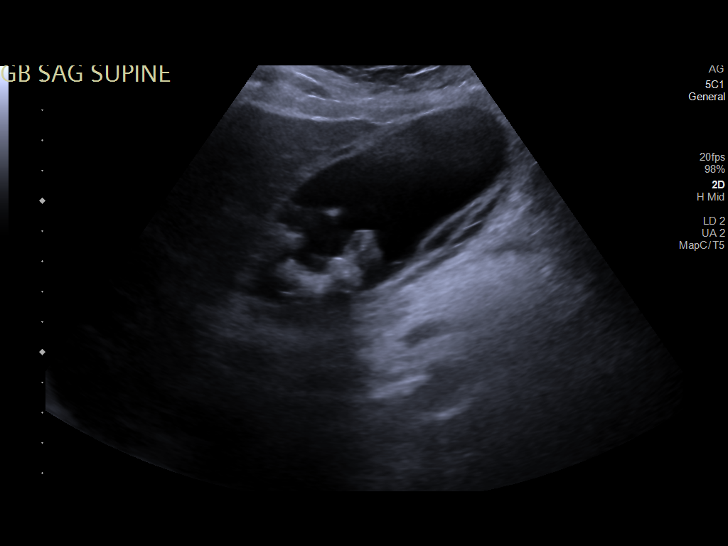
[im 3/34]
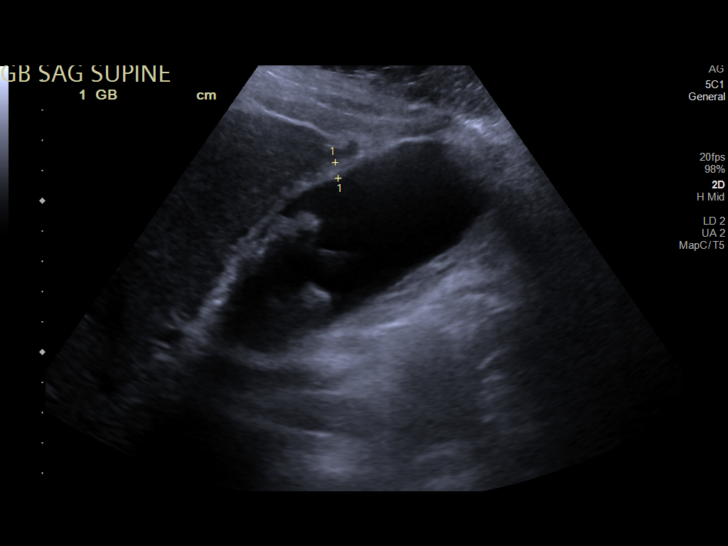
[im 6/34]
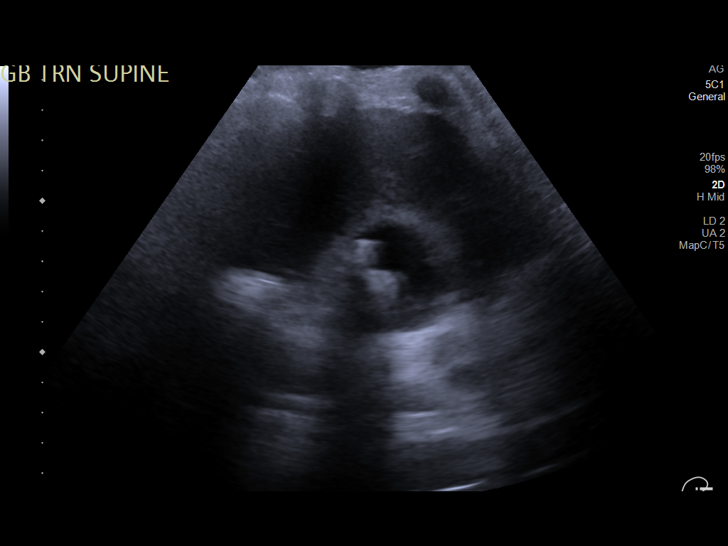
[im 9/34]
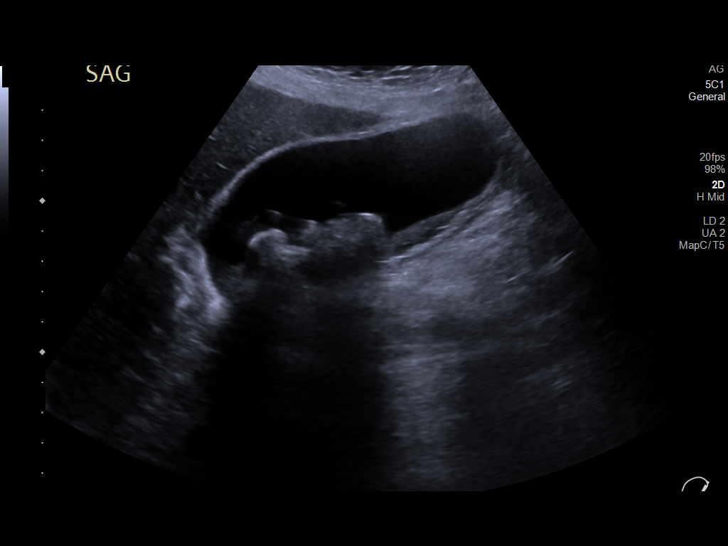
[im 12/34]
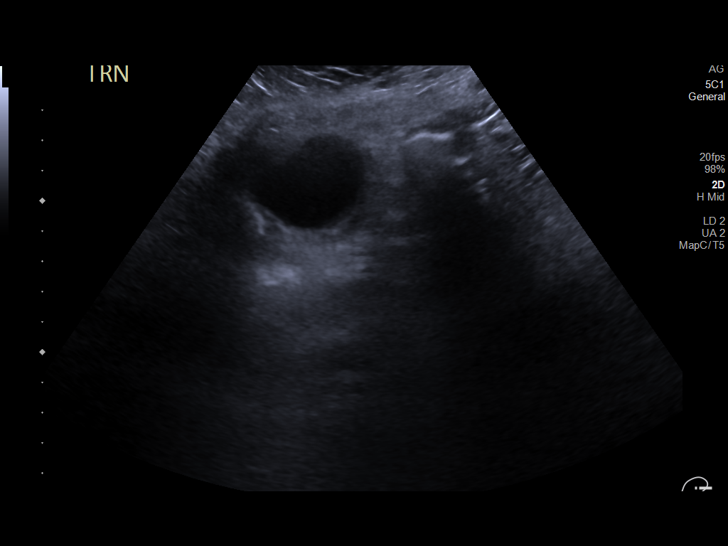
[im 13/34]
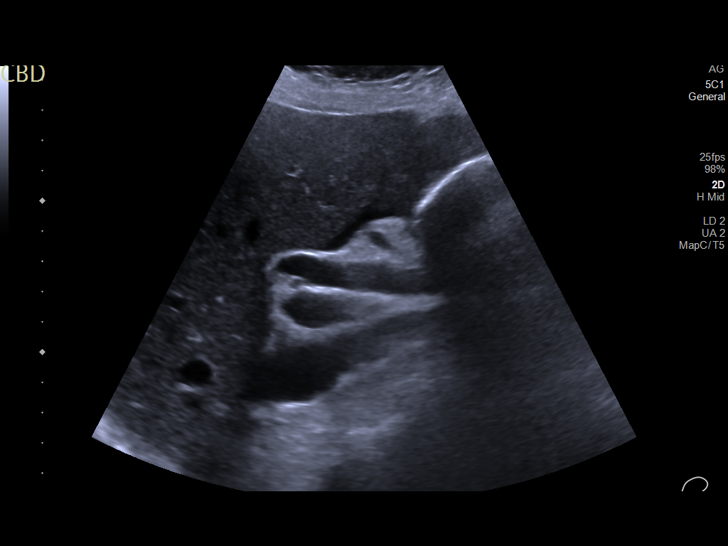
[im 16/34]
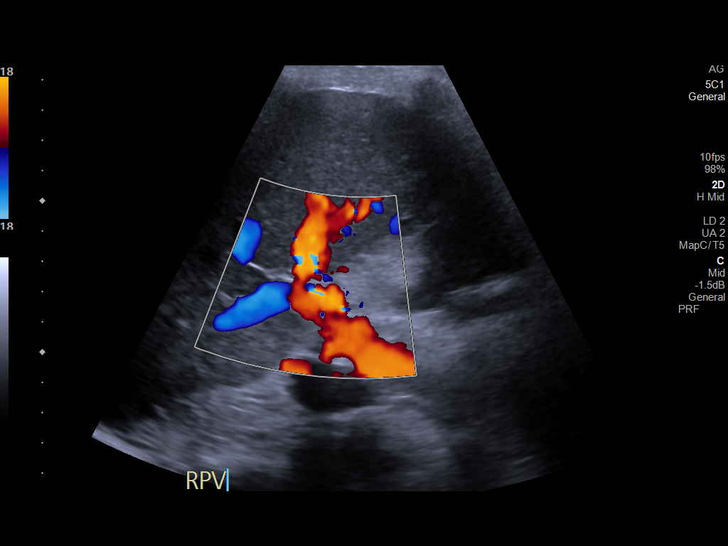
[im 18/34]
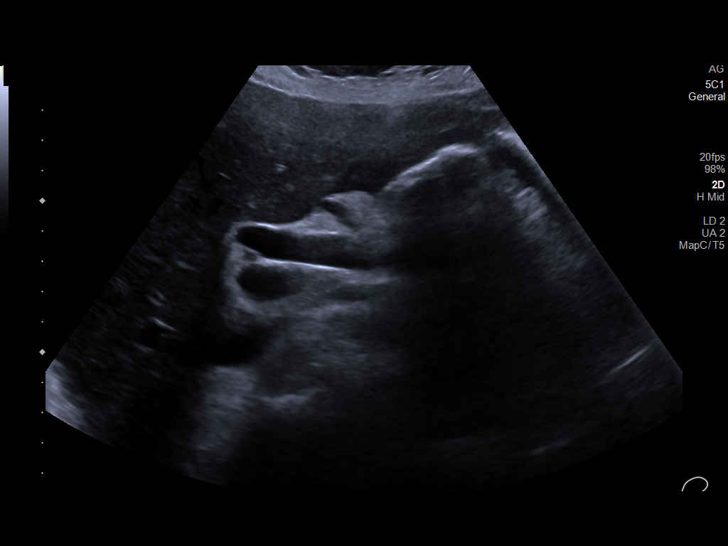
[im 21/34]
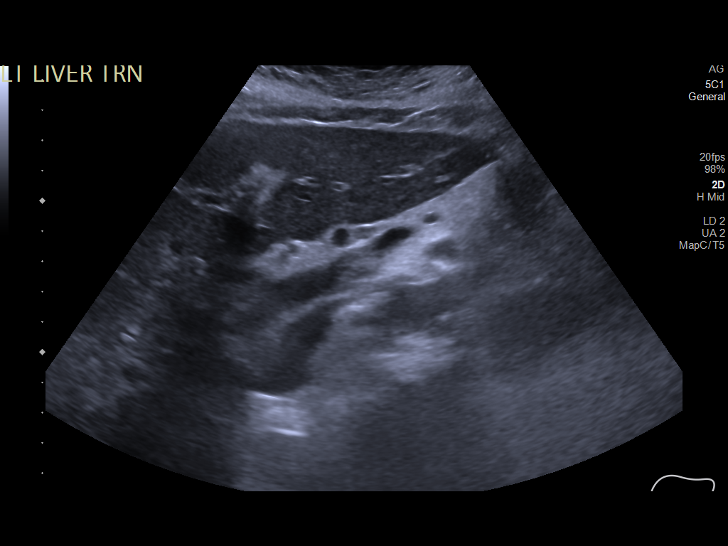
[im 23/34]
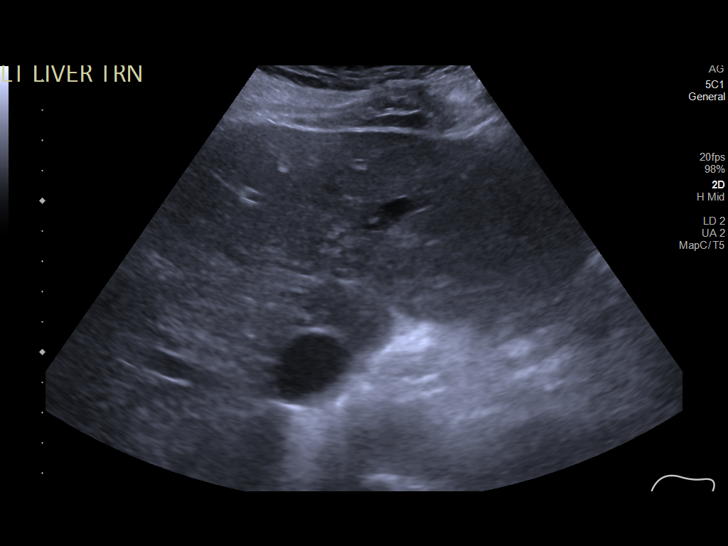
[im 25/34]
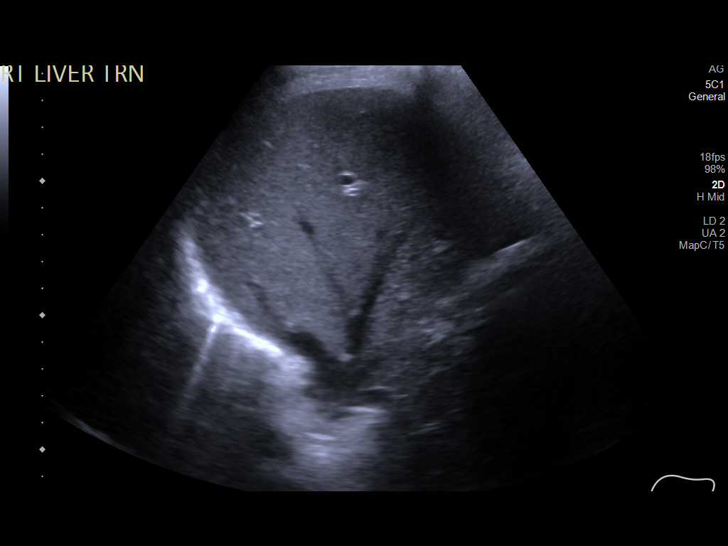
[im 28/34]
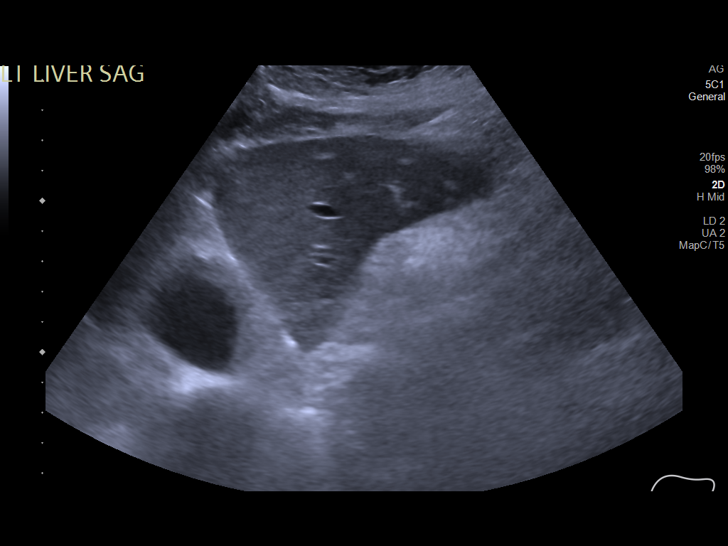
[im 31/34]
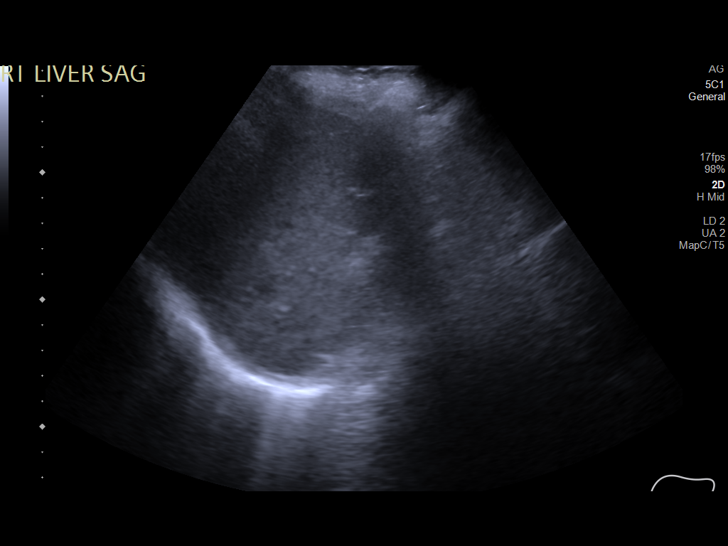
[im 34/34]
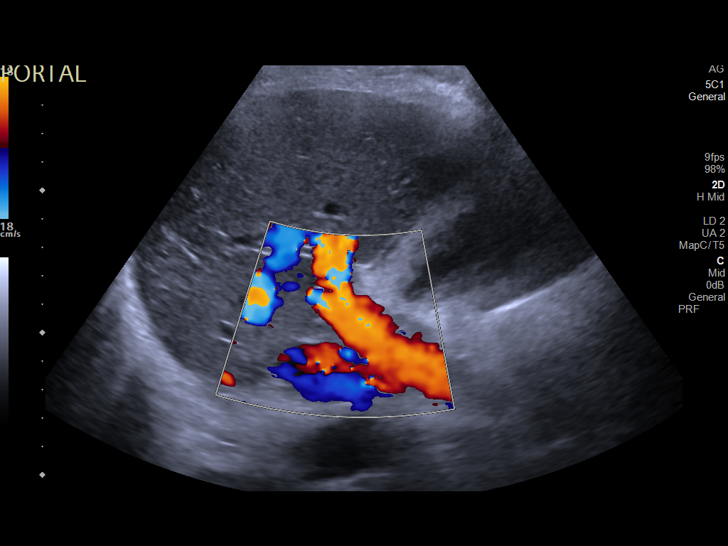

[14 of 25 positions shown; findings below may reference images not displayed]

FINDINGS: Gallbladder:

Gallbladder is enlarged with a thickened wall measuring 5 mm and
pericholecystic fluid. There are cholelithiasis measuring up to
cm with biliary sludge. Positive sonographic Murphy sign noted by
sonographer.

Common bile duct:

Diameter: 9 mm

Liver:

No focal lesion identified. Within normal limits in parenchymal
echogenicity. Portal vein is patent on color Doppler imaging with
normal direction of blood flow towards the liver.

Other: None.
IMPRESSION: 1. Sonographic findings consistent with acute calculus
cholecystitis.
2. Dilated common bile duct measuring 9 mm, without visualized
filling defect. If clinical concern for choledocholithiasis
recommend ERCP/MRCP.

## 2020-12-06 IMAGING — CT CT ABD-PELV W/ CM
2 of 5 series · 15 of 46 positions shown, 17 images · IV contrast (APPLIED)
Comparison: CT abdomen pelvis [DATE]

CLINICAL DATA: Mid abdominal pain since last night, history of
diverticulitis.

EXAM:
CT ABDOMEN AND PELVIS WITH CONTRAST
TECHNIQUE: Multidetector CT imaging of the abdomen and pelvis was performed
using the standard protocol following bolus administration of
intravenous contrast.
CONTRAST:  100mL OMNIPAQUE IOHEXOL 300 MG/ML  SOLN

[Series 3: abd/ pelvis 5.0 i30f 2 · axial · 0.91mm/px · z∈[+845,+1270]mm · 12 of 96 slices shown, 14 images]
[im 6/96  soft-tissue]
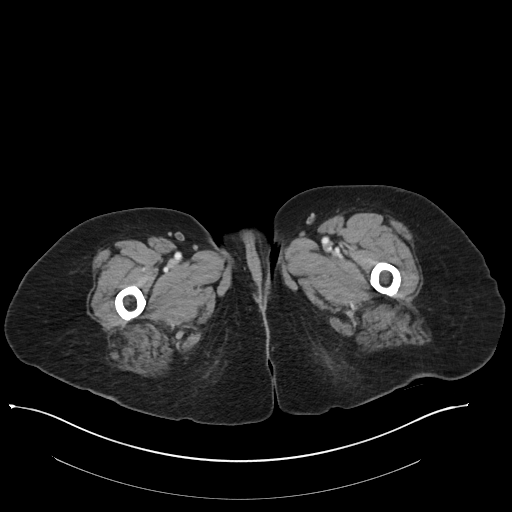
[im 6/96  bone]
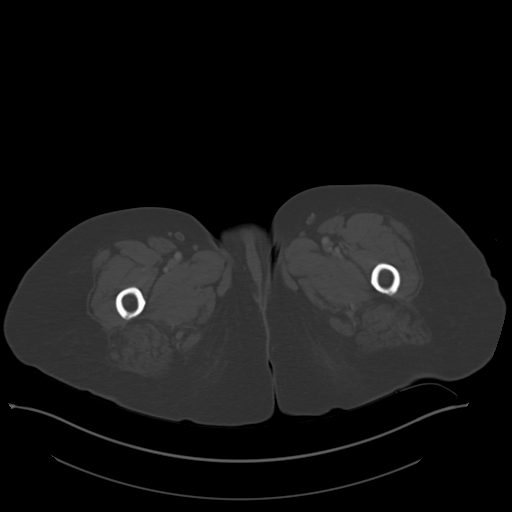
[im 16/96  soft-tissue]
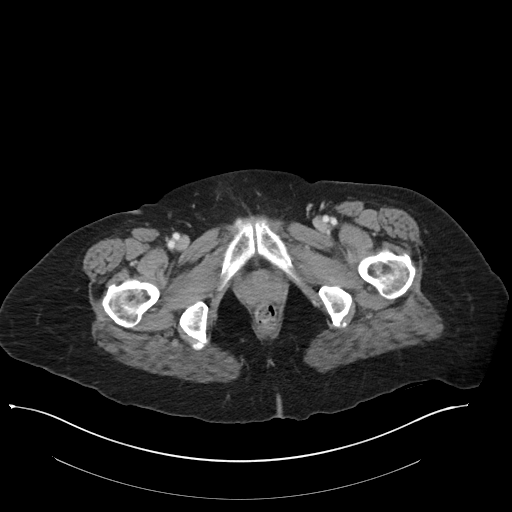
[im 21/96  soft-tissue]
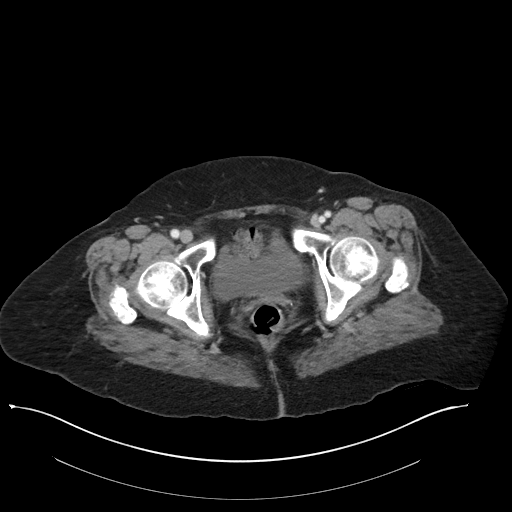
[im 31/96  soft-tissue]
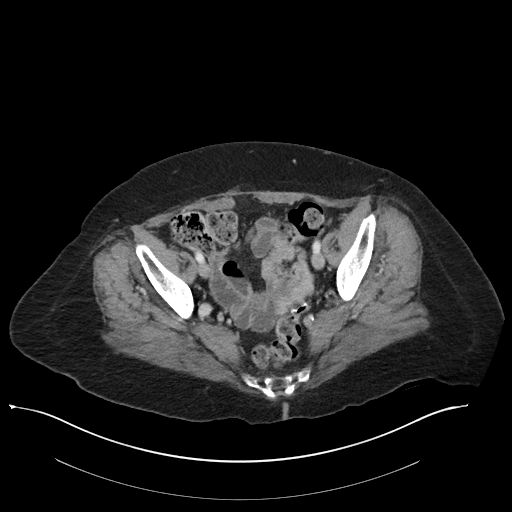
[im 36/96  soft-tissue]
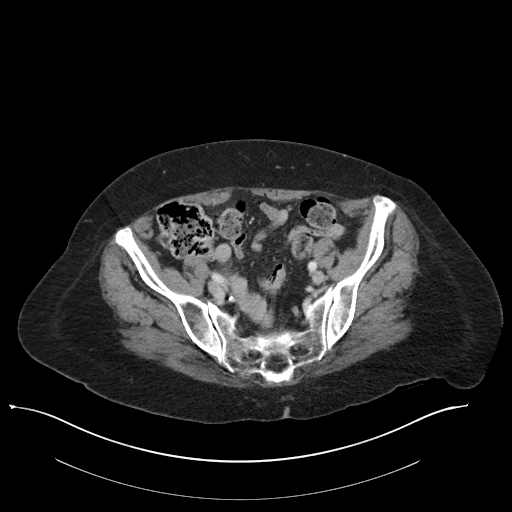
[im 46/96  soft-tissue]
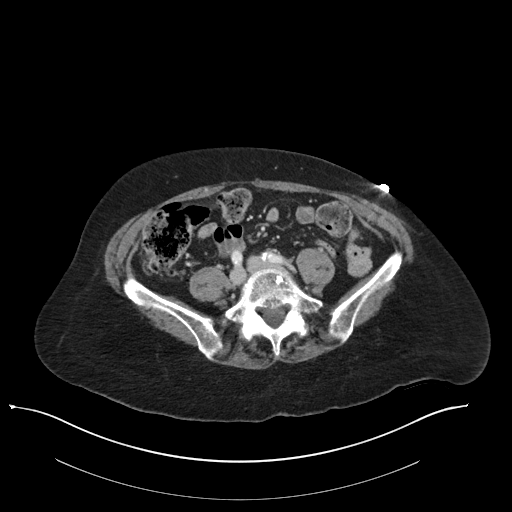
[im 51/96  soft-tissue]
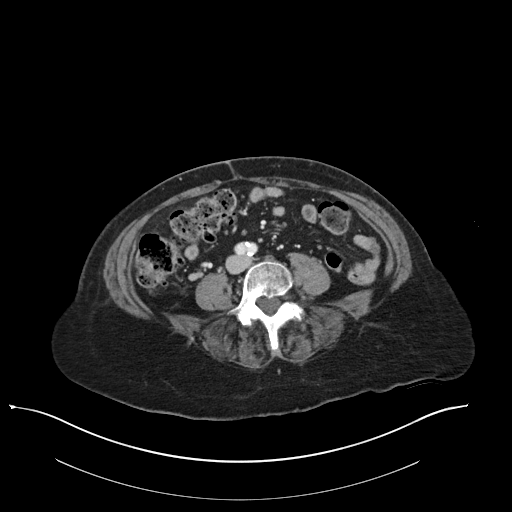
[im 61/96  soft-tissue]
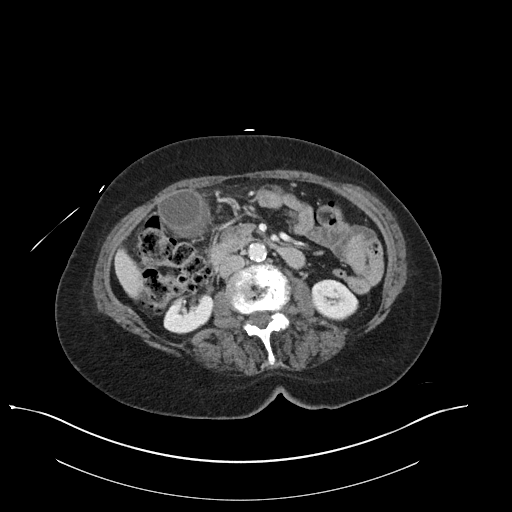
[im 66/96  soft-tissue]
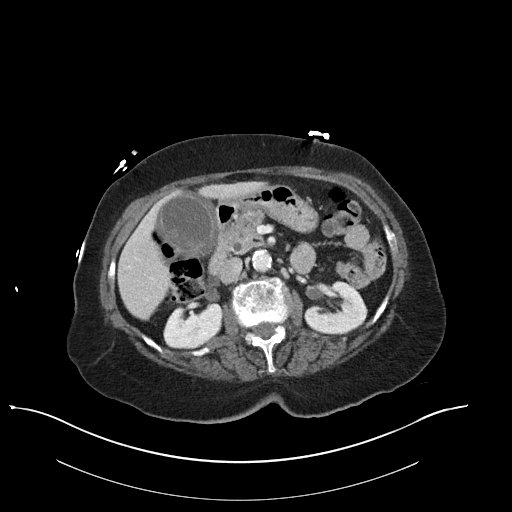
[im 66/96  bone]
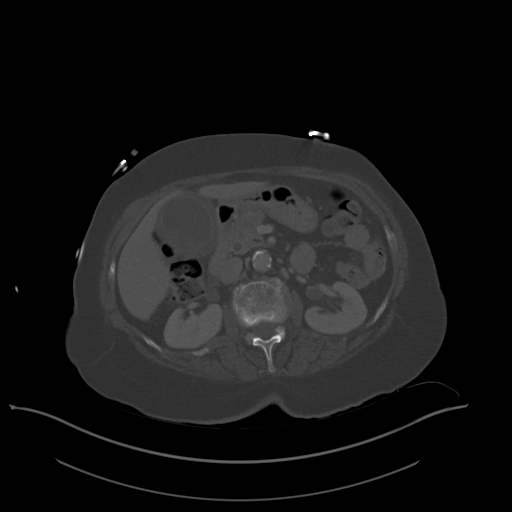
[im 76/96  soft-tissue]
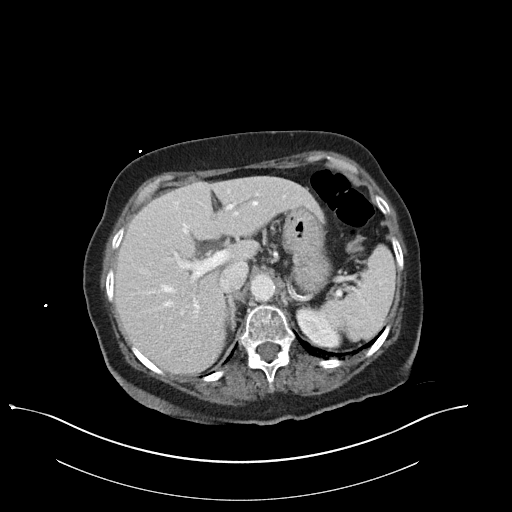
[im 81/96  soft-tissue]
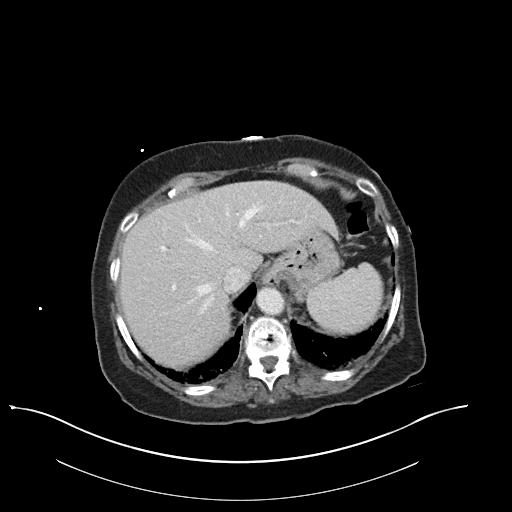
[im 91/96  soft-tissue]
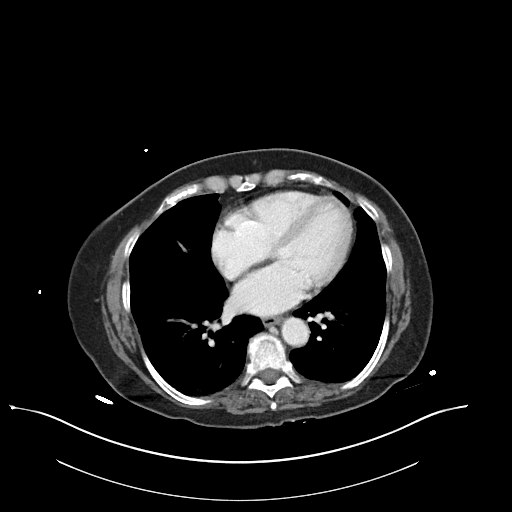

[Series 7: coronal soft tissue · coronal · 0.91mm/px · 3 of 113 slices shown]
[im 38/113  soft-tissue]
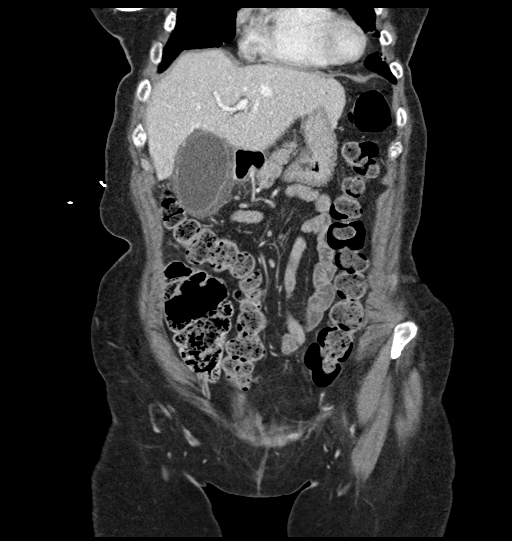
[im 50/113  soft-tissue]
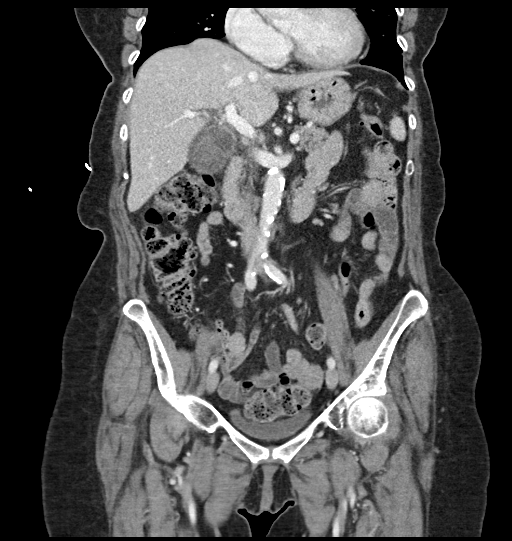
[im 63/113  soft-tissue]
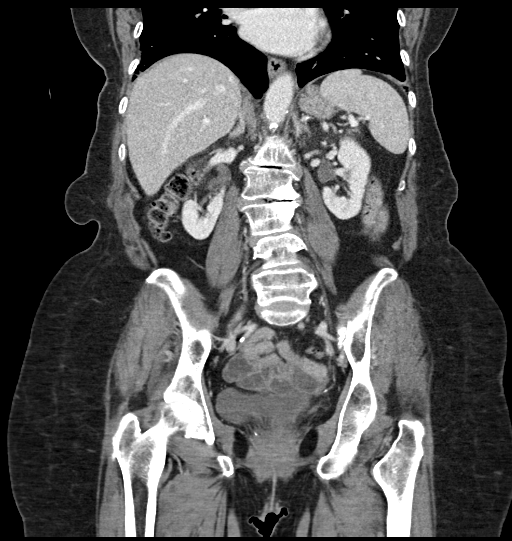

[15 of 46 positions shown; findings below may reference images not displayed]

FINDINGS: Lower chest: Partially visualized calcified right breast implant.
Subsegmental atelectasis. No pericardial effusion.

Hepatobiliary: No suspicious hepatic lesion. Cholelithiasis with a
stone in the neck of the gallbladder distended gallbladder
gallbladder wall thickening and pericholecystic fluid. There is
prominence of the intrahepatic ducts however the common bile duct is
within normal limits measuring 5 mm.

Pancreas: No evidence of pancreatic inflammation. There is similar
prominence of the pancreatic duct measuring up to 3 mm in the body.
There is a 5 mm cystic lesion in the body of the pancreas (series 3,
image 28).

Spleen: Splenic granuloma.

Adrenals/Urinary Tract: Adrenal glands are unremarkable. Kidneys are
normal, without renal calculi, focal lesion, or hydronephrosis.
Bladder is unremarkable.

Stomach/Bowel: Stomach is unremarkable. No suspicious small bowel
wall thickening or dilation. Appendix is visualized in the right
lower quadrant with a 3 mm appendicoliths without evidence of
adjacent inflammation. Terminal ileum appears unremarkable. Colonic
diverticulosis without findings of acute diverticulitis. Anastomotic
sutures in the left hemipelvis.

Vascular/Lymphatic: Aortic atherosclerosis. No enlarged abdominal or
pelvic lymph nodes.

Reproductive: Status post hysterectomy. No adnexal masses.

Other: No abdominopelvic ascites.

Musculoskeletal: Diffuse demineralization of bone. Superior endplate
compression deformity at T11 with approximately 35% height loss
which is new in comparison to most recent prior CT abdomen pelvis.
No significant change in appearance of the superior endplate
compression deformity at T10.
IMPRESSION: 1. Acute calculus cholecystitis.
2. Colonic diverticulosis WITHOUT findings of acute diverticulitis.
3. Superior endplate compression deformity at T11 with approximately
35% height loss which is technically age indeterminate but appears
chronic, recommend correlation with point tenderness. No significant
change in appearance of the superior endplate compression deformity
at T10.
4. There is a 5 mm cystic lesion in the body of the pancreas with
similar prominence of the pancreatic duct, favor side branch IPMN.
Recommend follow-up pancreatic protocol MRI with and without
contrast for further evaluation. This recommendation follows ACR
consensus guidelines: Management of Incidental Pancreatic Cysts: A
White Paper of the ACR Incidental Findings Committee. [HOSPITAL] [VB];[DATE].
5. Aortic atherosclerosis.  Aortic Atherosclerosis ([VB]-[VB]).

These results were called by telephone at the time of interpretation
on [DATE] at [DATE] to provider CHALANA , who verbally
acknowledged these results.

## 2020-12-06 MED ORDER — BISACODYL 10 MG RE SUPP: 10.0000 mg | Freq: Every day | RECTAL | Status: DC | PRN

## 2020-12-06 MED ORDER — HYDRALAZINE HCL 20 MG/ML IJ SOLN: 10.0000 mg | INTRAMUSCULAR | Status: DC | PRN

## 2020-12-06 MED ORDER — ALBUTEROL SULFATE HFA 108 (90 BASE) MCG/ACT IN AERS
2.0000 | INHALATION_SPRAY | Freq: Four times a day (QID) | RESPIRATORY_TRACT | Status: DC | PRN
  Filled 2020-12-06: qty 6.7

## 2020-12-06 MED ORDER — KETOROLAC TROMETHAMINE 15 MG/ML IJ SOLN: 15.0000 mg | Freq: Four times a day (QID) | INTRAMUSCULAR | Status: DC | PRN

## 2020-12-06 MED ORDER — OXYCODONE HCL 5 MG PO TABS
5.0000 mg | ORAL_TABLET | ORAL | Status: DC | PRN
  Administered 2020-12-07 – 2020-12-08 (×3): 10 mg via ORAL
  Filled 2020-12-06 (×3): qty 2

## 2020-12-06 MED ORDER — DIPHENHYDRAMINE HCL 50 MG/ML IJ SOLN: 12.5000 mg | Freq: Four times a day (QID) | INTRAMUSCULAR | Status: DC | PRN

## 2020-12-06 MED ORDER — HYDROMORPHONE HCL 1 MG/ML IJ SOLN
0.5000 mg | INTRAMUSCULAR | Status: DC | PRN
  Administered 2020-12-06 – 2020-12-07 (×3): 0.5 mg via INTRAVENOUS
  Filled 2020-12-06 (×3): qty 1

## 2020-12-06 MED ORDER — DOCUSATE SODIUM 100 MG PO CAPS
100.0000 mg | ORAL_CAPSULE | Freq: Two times a day (BID) | ORAL | Status: DC
  Administered 2020-12-07 – 2020-12-08 (×2): 100 mg via ORAL
  Filled 2020-12-06 (×2): qty 1

## 2020-12-06 MED ORDER — HYDROXYCHLOROQUINE SULFATE 200 MG PO TABS: 200.0000 mg | ORAL_TABLET | Freq: Two times a day (BID) | ORAL | Status: DC

## 2020-12-06 MED ORDER — HYDROMORPHONE HCL 1 MG/ML IJ SOLN
0.5000 mg | Freq: Once | INTRAMUSCULAR | Status: AC
  Administered 2020-12-06: 0.5 mg via INTRAVENOUS
  Filled 2020-12-06: qty 1

## 2020-12-06 MED ORDER — SODIUM CHLORIDE 0.9 % IV BOLUS
1000.0000 mL | Freq: Once | INTRAVENOUS | Status: AC
  Administered 2020-12-06: 1000 mL via INTRAVENOUS

## 2020-12-06 MED ORDER — DIPHENHYDRAMINE HCL 12.5 MG/5ML PO ELIX: 12.5000 mg | ORAL_SOLUTION | Freq: Four times a day (QID) | ORAL | Status: DC | PRN

## 2020-12-06 MED ORDER — TRAMADOL HCL 50 MG PO TABS: 50.0000 mg | ORAL_TABLET | Freq: Four times a day (QID) | ORAL | Status: DC | PRN

## 2020-12-06 MED ORDER — PIPERACILLIN-TAZOBACTAM 3.375 G IVPB
3.3750 g | Freq: Three times a day (TID) | INTRAVENOUS | Status: DC
  Administered 2020-12-07 (×2): 3.375 g via INTRAVENOUS
  Filled 2020-12-06 (×2): qty 50

## 2020-12-06 MED ORDER — SODIUM CHLORIDE 0.9 % IV SOLN: INTRAVENOUS | Status: DC

## 2020-12-06 MED ORDER — PANTOPRAZOLE SODIUM 40 MG PO TBEC
40.0000 mg | DELAYED_RELEASE_TABLET | Freq: Every day | ORAL | Status: DC
  Administered 2020-12-07 – 2020-12-08 (×2): 40 mg via ORAL
  Filled 2020-12-06 (×2): qty 1

## 2020-12-06 MED ORDER — ONDANSETRON HCL 4 MG/2ML IJ SOLN
4.0000 mg | Freq: Once | INTRAMUSCULAR | Status: AC
  Administered 2020-12-06: 4 mg via INTRAVENOUS
  Filled 2020-12-06: qty 2

## 2020-12-06 MED ORDER — HYDROMORPHONE HCL 1 MG/ML IJ SOLN
1.0000 mg | Freq: Once | INTRAMUSCULAR | Status: AC
  Administered 2020-12-06: 1 mg via INTRAVENOUS
  Filled 2020-12-06: qty 1

## 2020-12-06 MED ORDER — METOPROLOL TARTRATE 5 MG/5ML IV SOLN: 5.0000 mg | Freq: Four times a day (QID) | INTRAVENOUS | Status: DC | PRN

## 2020-12-06 MED ORDER — HYDROXYCHLOROQUINE SULFATE 200 MG PO TABS
200.0000 mg | ORAL_TABLET | Freq: Two times a day (BID) | ORAL | Status: DC
  Administered 2020-12-06 – 2020-12-08 (×3): 200 mg via ORAL
  Filled 2020-12-06 (×3): qty 1

## 2020-12-06 MED ORDER — IOHEXOL 300 MG/ML  SOLN
100.0000 mL | Freq: Once | INTRAMUSCULAR | Status: AC | PRN
  Administered 2020-12-06: 100 mL via INTRAVENOUS

## 2020-12-06 MED ORDER — ENOXAPARIN SODIUM 40 MG/0.4ML ~~LOC~~ SOLN: 40.0000 mg | SUBCUTANEOUS | Status: DC

## 2020-12-06 MED ORDER — ACETAMINOPHEN 500 MG PO TABS
1000.0000 mg | ORAL_TABLET | Freq: Four times a day (QID) | ORAL | Status: DC
  Administered 2020-12-07 (×2): 1000 mg via ORAL
  Filled 2020-12-06 (×2): qty 2

## 2020-12-06 MED ORDER — PIPERACILLIN-TAZOBACTAM 3.375 G IVPB 30 MIN
3.3750 g | Freq: Once | INTRAVENOUS | Status: AC
  Administered 2020-12-06: 3.375 g via INTRAVENOUS
  Filled 2020-12-06: qty 50

## 2020-12-06 MED ORDER — METHOCARBAMOL 500 MG PO TABS
500.0000 mg | ORAL_TABLET | Freq: Four times a day (QID) | ORAL | Status: DC | PRN
  Administered 2020-12-07 – 2020-12-08 (×3): 500 mg via ORAL
  Filled 2020-12-06 (×3): qty 1

## 2020-12-06 MED ORDER — ONDANSETRON 4 MG PO TBDP: 4.0000 mg | ORAL_TABLET | Freq: Four times a day (QID) | ORAL | Status: DC | PRN

## 2020-12-06 MED ORDER — MELATONIN 5 MG PO TABS
10.0000 mg | ORAL_TABLET | Freq: Every evening | ORAL | Status: DC | PRN
  Filled 2020-12-06: qty 2

## 2020-12-06 MED ORDER — ONDANSETRON HCL 4 MG/2ML IJ SOLN
4.0000 mg | Freq: Four times a day (QID) | INTRAMUSCULAR | Status: DC | PRN
  Administered 2020-12-06 – 2020-12-07 (×2): 4 mg via INTRAVENOUS
  Filled 2020-12-06 (×2): qty 2

## 2020-12-06 NOTE — ED Notes (Signed)
Patient transported to US 

## 2020-12-06 NOTE — ED Notes (Signed)
Patient transported to CT 

## 2020-12-06 NOTE — ED Provider Notes (Signed)
[facility] EMERGENCY DEPARTMENT Provider Note   CSN: [id number] Arrival date & time: 12/06/20  1144     History Chief Complaint  Patient presents with  . Abdominal Pain    Denise Payne is a 75 y.o. female with PMHx HTN, GERD, sjogren's syndrome who presents to the ED today with complaint of gradual onset, constant, sharp, diffuse abdominal pain that began around 9 PM last night. Pt denies suspicious food intake prior. She does endorse nausea; she forced herself to vomit once to see if it would alleviate her pain which it did not. Last normal bowel movement yesterday. She reports she felt constipated and like she could not pass gas as well however passed a small amount while in the waiting room. Pt endorses previous history of complicated diverticulitis with abscess and perforation in 2020 and states this feels similar. Previous abdominal surgeries include hysterectomy and sigmoidectomy. Pt denies fevers, chills, diarrhea, urinary symptoms, or any other associated symptoms.   The history is provided by the patient and medical records.       Past Medical History:  Diagnosis Date  . Anemia   . Asthma   . Cataract   . GERD (gastroesophageal reflux disease)   . Osteoporosis   . Sjogren's syndrome ([place]) 01/26/2019    Patient Active Problem List   Diagnosis Date Noted  . Benign essential HTN 04/12/2020  . GERD (gastroesophageal reflux disease) 03/12/2020  . Osteopenia 03/12/2020  . Status post laparoscopic-assisted sigmoidectomy 05/22/2019  . 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 Asthma 01/26/2019  . Sjogren's syndrome ([place]) 01/26/2019  . Diverticulitis of large intestine with perforation 01/25/2019    Past Surgical History:  Procedure Laterality Date  . ABDOMINAL HYSTERECTOMY  1996  . ANKLE FRACTURE SURGERY  2005  . AUGMENTATION MAMMAPLASTY Bilateral 10/16/1976  . CYSTOSCOPY  WITH STENT PLACEMENT N/A 05/22/2019   Procedure: CYSTOSCOPY WITH STENT PLACEMENT;  Surgeon: Irine Seal, MD;  Location: [facility];  Service: Urology;  Laterality: N/A;  . FLEXIBLE SIGMOIDOSCOPY N/A 05/22/2019   Procedure: FLEXIBLE SIGMOIDOSCOPY;  Surgeon: Ileana Roup, MD;  Location: [facility];  Service: General;  Laterality: N/A;  . XI ROBOTIC ASSISTED LOWER ANTERIOR RESECTION Bilateral 05/22/2019   Procedure: XI ROBOTIC ASSISTED SIGMOIDECTOMY;  Surgeon: Ileana Roup, MD;  Location: [facility];  Service: General;  Laterality: Bilateral;     OB History   No obstetric history on file.     Family History  Problem Relation Age of Onset  . Breast cancer Mother 14  . Hearing loss Mother   . Heart disease Mother   . High Cholesterol Mother   . Hypertension Mother   . Cancer Father   . Hearing loss Father   . Stroke Father   . Transient ischemic attack Father   . Arthritis Maternal Grandmother   . Cancer Maternal Grandmother   . Early death Maternal Grandmother   . Diabetes Maternal Grandfather   . Early death Maternal Grandfather   . Hearing loss Maternal Grandfather   . Heart attack Maternal Grandfather   . Heart disease Maternal Grandfather   . Arthritis Paternal Grandmother   . Early death Paternal Grandmother   . Hearing loss Paternal Grandmother   . Hyperlipidemia Paternal Grandmother   . Early death Paternal Grandfather   . Kidney disease Paternal Grandfather   . Cancer Sister   . Hyperlipidemia Sister   . Hypertension Sister   . Birth defects Brother   .  Cancer Brother   . Hyperlipidemia Brother     Social History   Tobacco Use  . Smoking status: Never Smoker  . Smokeless tobacco: Never Used  Vaping Use  . Vaping Use: Never used  Substance Use Topics  . Alcohol use: Yes    Alcohol/week: 8.0 standard drinks    Types: 8 Glasses of wine per week    Comment: 3 times daily  . Drug use: Never    Home Medications Prior to Admission medications   Medication Sig  Start Date End Date Taking? Authorizing Provider  acetaminophen (TYLENOL) 500 MG tablet Take 1,000 mg by mouth every 6 (six) hours as needed for mild pain.   Yes [provider]  albuterol (PROVENTIL HFA;VENTOLIN HFA) 108 (90 Base) MCG/ACT inhaler Inhale 2 puffs into the lungs every 6 (six) hours as needed for wheezing or shortness of breath.   Yes [provider]  aspirin EC 81 MG tablet Take 81 mg by mouth every evening.    Yes [provider]  BIOTIN PO Take 1 tablet by mouth daily.   Yes [provider]  Cholecalciferol (VITAMIN D) 50 MCG (2000 UT) tablet Take 2,000 Units by mouth daily.   Yes [provider]  cyclobenzaprine (FLEXERIL) 10 MG tablet Take 1 tablet (10 mg total) by mouth 3 (three) times daily as needed for muscle spasms. 03/12/20  Yes Orma Flaming, MD  diclofenac sodium (VOLTAREN) 1 % GEL Apply 2 g topically 2 (two) times daily as needed (pain).    Yes [provider]  esomeprazole (NEXIUM) 40 MG capsule TAKE 1 CAPSULE DAILY Patient taking differently: Take 40 mg by mouth daily. 12/02/20  Yes Orma Flaming, MD  hydroxychloroquine (PLAQUENIL) 200 MG tablet Take 200 mg by mouth 2 (two) times daily.   Yes [provider]  lisinopril (ZESTRIL) 40 MG tablet Take 1 tablet (40 mg total) by mouth daily. 06/16/20  Yes Orma Flaming, MD  Melatonin 10 MG TABS Take 10 mg by mouth at bedtime as needed (sleep).    Yes [provider]  montelukast (SINGULAIR) 10 MG tablet Take 1 tablet (10 mg total) by mouth at bedtime. 05/26/20  Yes Orma Flaming, MD  naproxen sodium (ALEVE) 220 MG tablet Take 440 mg by mouth daily as needed (pain).   Yes [provider]  Probiotic Product (PROBIOTIC PO) Take 1 tablet by mouth daily.   Yes [provider]  vitamin B-12 (CYANOCOBALAMIN) 1000 MCG tablet Take 1,000 mcg by mouth daily.    Yes [provider]    Allergies    Latex, Morphine and related, and  Onion  Review of Systems   Review of Systems  Constitutional: Negative for chills and fever.  Gastrointestinal: Positive for abdominal pain, constipation, nausea and vomiting (self induced).  Genitourinary: Negative for dysuria, flank pain and frequency.  All other systems reviewed and are negative.   Physical Exam Updated Vital Signs BP (!) 108/52 (BP Location: Right Arm)   Pulse (!) 103   Temp 98.9 F (37.2 C) (Oral)   Resp 20   SpO2 98%   Physical Exam Vitals and nursing note reviewed.  Constitutional:      Appearance: She is ill-appearing. She is not diaphoretic.  HENT:     Head: Normocephalic and atraumatic.  Eyes:     Conjunctiva/sclera: Conjunctivae normal.  Cardiovascular:     Rate and Rhythm: Normal rate and regular rhythm.     Heart sounds: Normal heart sounds.  Pulmonary:  Effort: Pulmonary effort is normal.     Breath sounds: Normal breath sounds. No wheezing, rhonchi or rales.  Abdominal:     General: Abdomen is flat. Bowel sounds are decreased.     Palpations: Abdomen is soft.     Tenderness: There is generalized abdominal tenderness. There is no right CVA tenderness, left CVA tenderness, guarding or rebound.  Musculoskeletal:     Cervical back: Neck supple.  Skin:    General: Skin is warm and dry.  Neurological:     Mental Status: She is alert.     ED Results / Procedures / Treatments   Labs (all labs ordered are listed, but only abnormal results are displayed) Labs Reviewed  COMPREHENSIVE METABOLIC PANEL - Abnormal; Notable for the following components:      Result Value   Glucose, Bld 120 (*)    All other components within normal limits  CBC - Abnormal; Notable for the following components:   WBC 14.9 (*)    All other components within normal limits  RESP PANEL BY RT-PCR (FLU A&B, COVID) ARPGX2  CULTURE, BLOOD (ROUTINE X 2)  CULTURE, BLOOD (ROUTINE X 2)  LIPASE, BLOOD  URINALYSIS, ROUTINE W REFLEX MICROSCOPIC  LACTIC ACID, PLASMA     EKG None  Radiology CT Abdomen Pelvis W Contrast  Result Date: 12/06/2020 CLINICAL DATA:  Mid abdominal pain since last night, history of diverticulitis. EXAM: CT ABDOMEN AND PELVIS WITH CONTRAST TECHNIQUE: Multidetector CT imaging of the abdomen and pelvis was performed using the standard protocol following bolus administration of intravenous contrast. CONTRAST:  139mL OMNIPAQUE IOHEXOL 300 MG/ML  SOLN COMPARISON:  CT abdomen pelvis Feb 14, 2019 FINDINGS: Lower chest: Partially visualized calcified right breast implant. Subsegmental atelectasis. No pericardial effusion. Hepatobiliary: No suspicious hepatic lesion. Cholelithiasis with a stone in the neck of the gallbladder distended gallbladder gallbladder wall thickening and pericholecystic fluid. There is prominence of the intrahepatic ducts however the common bile duct is within normal limits measuring 5 mm. Pancreas: No evidence of pancreatic inflammation. There is similar prominence of the pancreatic duct measuring up to 3 mm in the body. There is a 5 mm cystic lesion in the body of the pancreas (series 3, image 28). Spleen: Splenic granuloma. Adrenals/Urinary Tract: Adrenal glands are unremarkable. Kidneys are normal, without renal calculi, focal lesion, or hydronephrosis. Bladder is unremarkable. Stomach/Bowel: Stomach is unremarkable. No suspicious small bowel wall thickening or dilation. Appendix is visualized in the right lower quadrant with a 3 mm appendicoliths without evidence of adjacent inflammation. Terminal ileum appears unremarkable. Colonic diverticulosis without findings of acute diverticulitis. Anastomotic sutures in the left hemipelvis. Vascular/Lymphatic: Aortic atherosclerosis. No enlarged abdominal or pelvic lymph nodes. Reproductive: Status post hysterectomy. No adnexal masses. Other: No abdominopelvic ascites. Musculoskeletal: Diffuse demineralization of bone. Superior endplate compression deformity at T11 with approximately 35%  height loss which is new in comparison to most recent prior CT abdomen pelvis. No significant change in appearance of the superior endplate compression deformity at T10. IMPRESSION: 1. Acute calculus cholecystitis. 2. Colonic diverticulosis WITHOUT findings of acute diverticulitis. 3. Superior endplate compression deformity at T11 with approximately 35% height loss which is technically age indeterminate but appears chronic, recommend correlation with point tenderness. No significant change in appearance of the superior endplate compression deformity at T10. 4. There is a 5 mm cystic lesion in the body of the pancreas with similar prominence of the pancreatic duct, favor side branch IPMN. Recommend follow-up pancreatic protocol MRI with and without contrast for further evaluation.  This recommendation follows ACR consensus guidelines: Management of Incidental Pancreatic Cysts: A White Paper of the ACR Incidental Findings Committee. J Am Coll Radiol 9470;96:283-662. 5. Aortic atherosclerosis.  Aortic Atherosclerosis (ICD10-I70.0). These results were called by telephone at the time of interpretation on 12/06/2020 at 4:52 pm to provider Wynema Garoutte , who verbally acknowledged these results. Electronically Signed   By: Dahlia Bailiff MD   On: 12/06/2020 16:53   US Abdomen Limited RUQ (LIVER/GB)  Result Date: 12/06/2020 CLINICAL DATA:  Concern for acute cholecystitis. EXAM: ULTRASOUND ABDOMEN LIMITED RIGHT UPPER QUADRANT COMPARISON:  Same day CT abdomen pelvis. FINDINGS: Gallbladder: Gallbladder is enlarged with a thickened wall measuring 5 mm and pericholecystic fluid. There are cholelithiasis measuring up to 1.4 cm with biliary sludge. Positive sonographic Murphy sign noted by sonographer. Common bile duct: Diameter: 9 mm Liver: No focal lesion identified. Within normal limits in parenchymal echogenicity. Portal vein is patent on color Doppler imaging with normal direction of blood flow towards the liver. Other:  None. IMPRESSION: 1. Sonographic findings consistent with acute calculus cholecystitis. 2. Dilated common bile duct measuring 9 mm, without visualized filling defect. If clinical concern for choledocholithiasis recommend ERCP/MRCP. Electronically Signed   By: Dahlia Bailiff MD   On: 12/06/2020 18:03    Procedures Procedures   Medications Ordered in ED Medications  piperacillin-tazobactam (ZOSYN) IVPB 3.375 g (3.375 g Intravenous New Bag/Given 12/06/20 1812)  HYDROmorphone (DILAUDID) injection 1 mg (1 mg Intravenous Given 12/06/20 1542)  sodium chloride 0.9 % bolus 1,000 mL (0 mLs Intravenous Stopped 12/06/20 1806)  ondansetron (ZOFRAN) injection 4 mg (4 mg Intravenous Given 12/06/20 1602)  iohexol (OMNIPAQUE) 300 MG/ML solution 100 mL (100 mLs Intravenous Contrast Given 12/06/20 1618)  HYDROmorphone (DILAUDID) injection 0.5 mg (0.5 mg Intravenous Given 12/06/20 1807)    ED Course  I have reviewed the triage vital signs and the nursing notes.  Pertinent labs & imaging results that were available during my care of the patient were reviewed by me and considered in my medical decision making (see chart for details).  Clinical Course as of 12/06/20 Vernelle Emerald  Mon Dec 06, 2020  1531 WBC(!): 14.9 [MV]    Clinical Course User Index [MV] Eustaquio Maize, [place]   MDM Rules/Calculators/A&P                          75 year old female who presents to the ED today complaining of diffuse abdominal pain that began last night with associated nausea, self-induced emesis, constipation with concern that she is not passing gas appropriately.  History of complicated diverticulitis with abscess and perforation in the past which patient states feels similar.  On arrival to the ED patient is afebrile, mildly tachycardic in the low 100s, nontachypneic.  She does appear uncomfortable today on exam.  She has diffuse abdominal tenderness palpation without rebound or guarding, she does have decreased bowel sounds throughout.   Past surgical history includes hysterectomy as well as sigmoidectomy in 2020 secondary to her perforation.  Lab work was obtained while patient was in the waiting room including a CBC with a leukocytosis of 14,900 at this time.  CMP without electrolyte abnormalities, LFTs unremarkable.  Lipase within normal limits at 32.  Patient will need an CT scan at this time, will add on lactic acid given leukocytosis today.  Will provide fluids, Zofran, and medication.  Attending physician Dr. Eulis Foster has evaluated patient as well and agrees with plan.   Received call from radiologist -  appears to have acute calculus cholecystitis. Will add on RUQ ultrasound at this time with plan for admission; 2 hour covid test ordered. Will start on abx.   Ultrasound consistent with acute cholecystitis. Will consult general surgery at this time.   Discussed case with general surgeon Dr. Kae Heller who agrees to accept patient for admission.   This note was prepared using Dragon voice recognition software and may include unintentional dictation errors due to the inherent limitations of voice recognition software.   Final Clinical Impression(s) / ED Diagnoses Final diagnoses:  Abdominal pain  Acute calculous cholecystitis    Rx / DC Orders ED Discharge Orders    None       Eustaquio Maize, PA-C 12/06/20 Maebelle Munroe, MD 12/08/20 [phone]

## 2020-12-06 NOTE — ED Provider Notes (Signed)
  Face-to-face evaluation   History: She complains of onset of intestinal distress, yesterday, worse today with inability to pass gas, and nausea without vomiting.  She denies fever or chills.  She had similar symptoms in the past when she had diverticulitis.  No other recent illnesses.  She denies chest pain, shortness of breath, weakness or dizziness.  Physical exam: Elderly female alert cooperative.  Abdomen soft and tender, primarily right upper quadrant.  Heart tachycardic rate and normal rhythm without murmur.  Lungs clear anteriorly.  Medical screening examination/treatment/procedure(s) were conducted as a shared visit with non-physician practitioner(s) and myself.  I personally evaluated the patient during the encounter    Daleen Bo, MD 12/08/20 [phone]

## 2020-12-06 NOTE — H&P (Signed)
Surgical Evaluation Requesting provider: Eustaquio Maize, PA-C  Chief Complaint: Abdominal pain  HPI: Very pleasant 75yo woman with history of hypertension, GERD, Sjogren's syndrome who presented to the emergency department today with gradually worsening abdominal pain beginning around 9 PM yesterday.  This is in the central upper abdomen initially but is now more in the right upper quadrant.  She stated that initially it felt like hunger pains, sharp and constant in quality.  It has not necessarily worsened today but it has not abated at all.  This is associated with nausea, endorses 1 attempted self-induced emesis without relief of symptoms, no other emesis.  No known fever.  Last normal bowel movement was yesterday and she does endorse flatus today.  She does have a known history of gallstones based on previous imaging studies.  She has not had any prior symptoms of biliary colic.   Previous abdominal surgery includes hysterectomy, and LAR for sigmoid diverticulitis by Dr. Dema Severin in August 2020.  She is an Therapist, sports.    Allergies  Allergen Reactions  . Latex Shortness Of Breath and Swelling    Lip and eye swelling  . Morphine And Related Other (See Comments)    Makes her cramp in abdomen   . Onion Diarrhea and Other (See Comments)    Stomach cramps    Past Medical History:  Diagnosis Date  . Anemia   . Asthma   . Cataract   . GERD (gastroesophageal reflux disease)   . Osteoporosis   . Sjogren's syndrome ([place]) 01/26/2019    Past Surgical History:  Procedure Laterality Date  . ABDOMINAL HYSTERECTOMY  1996  . ANKLE FRACTURE SURGERY  2005  . AUGMENTATION MAMMAPLASTY Bilateral 10/16/1976  . CYSTOSCOPY WITH STENT PLACEMENT N/A 05/22/2019   Procedure: CYSTOSCOPY WITH STENT PLACEMENT;  Surgeon: Irine Seal, MD;  Location: [facility];  Service: Urology;  Laterality: N/A;  . FLEXIBLE SIGMOIDOSCOPY N/A 05/22/2019   Procedure: FLEXIBLE SIGMOIDOSCOPY;  Surgeon: Ileana Roup, MD;  Location: [facility];  Service: General;  Laterality: N/A;  . XI ROBOTIC ASSISTED LOWER ANTERIOR RESECTION Bilateral 05/22/2019   Procedure: XI ROBOTIC ASSISTED SIGMOIDECTOMY;  Surgeon: Ileana Roup, MD;  Location: [facility];  Service: General;  Laterality: Bilateral;    Family History  Problem Relation Age of Onset  . Breast cancer Mother 50  . Hearing loss Mother   . Heart disease Mother   . High Cholesterol Mother   . Hypertension Mother   . Cancer Father   . Hearing loss Father   . Stroke Father   . Transient ischemic attack Father   . Arthritis Maternal Grandmother   . Cancer Maternal Grandmother   . Early death Maternal Grandmother   . Diabetes Maternal Grandfather   . Early death Maternal Grandfather   . Hearing loss Maternal Grandfather   . Heart attack Maternal Grandfather   . Heart disease Maternal Grandfather   . Arthritis Paternal Grandmother   . Early death Paternal Grandmother   . Hearing loss Paternal Grandmother   . Hyperlipidemia Paternal Grandmother   . Early death Paternal Grandfather   . Kidney disease Paternal Grandfather   . Cancer Sister   . Hyperlipidemia Sister   . Hypertension Sister   . Birth defects Brother   . Cancer Brother   . Hyperlipidemia Brother     Social History   Socioeconomic History  . Marital status: Widowed    Spouse name: Not on file  . Number of children: Not on file  .  Years of education: Not on file  . Highest education level: Not on file  Occupational History  . Occupation: retired  Tobacco Use  . Smoking status: Never Smoker  . Smokeless tobacco: Never Used  Vaping Use  . Vaping Use: Never used  Substance and Sexual Activity  . Alcohol use: Yes    Alcohol/week: 8.0 standard drinks    Types: 8 Glasses of wine per week    Comment: 3 times daily  . Drug use: Never  . Sexual activity: Not Currently  Other Topics Concern  . Not on file  Social History Narrative  . Not on file   Social Determinants of Health   Financial  Resource Strain: Low Risk   . Difficulty of Paying Living Expenses: Not hard at all  Food Insecurity: No Food Insecurity  . Worried About Charity fundraiser in the Last Year: Never true  . Ran Out of Food in the Last Year: Never true  Transportation Needs: No Transportation Needs  . Lack of Transportation (Medical): No  . Lack of Transportation (Non-Medical): No  Physical Activity: Insufficiently Active  . Days of Exercise per Week: 4 days  . Minutes of Exercise per Session: 30 min  Stress: No Stress Concern Present  . Feeling of Stress : Not at all  Social Connections: Moderately Isolated  . Frequency of Communication with Friends and Family: More than three times a week  . Frequency of Social Gatherings with Friends and Family: More than three times a week  . Attends Religious Services: More than 4 times per year  . Active Member of Clubs or Organizations: No  . Attends Archivist Meetings: Never  . Marital Status: Widowed    No current facility-administered medications on file prior to encounter.   Current Outpatient Medications on File Prior to Encounter  Medication Sig Dispense Refill  . acetaminophen (TYLENOL) 500 MG tablet Take 1,000 mg by mouth every 6 (six) hours as needed for mild pain.    Marland Kitchen albuterol (PROVENTIL HFA;VENTOLIN HFA) 108 (90 Base) MCG/ACT inhaler Inhale 2 puffs into the lungs every 6 (six) hours as needed for wheezing or shortness of breath.    Marland Kitchen aspirin EC 81 MG tablet Take 81 mg by mouth every evening.     Marland Kitchen BIOTIN PO Take 1 tablet by mouth daily.    . Cholecalciferol (VITAMIN D) 50 MCG (2000 UT) tablet Take 2,000 Units by mouth daily.    . cyclobenzaprine (FLEXERIL) 10 MG tablet Take 1 tablet (10 mg total) by mouth 3 (three) times daily as needed for muscle spasms. 90 tablet 0  . diclofenac sodium (VOLTAREN) 1 % GEL Apply 2 g topically 2 (two) times daily as needed (pain).     Marland Kitchen esomeprazole (NEXIUM) 40 MG capsule TAKE 1 CAPSULE DAILY (Patient  taking differently: Take 40 mg by mouth daily.) 90 capsule 3  . hydroxychloroquine (PLAQUENIL) 200 MG tablet Take 200 mg by mouth 2 (two) times daily.    Marland Kitchen lisinopril (ZESTRIL) 40 MG tablet Take 1 tablet (40 mg total) by mouth daily. 90 tablet 3  . Melatonin 10 MG TABS Take 10 mg by mouth at bedtime as needed (sleep).     . montelukast (SINGULAIR) 10 MG tablet Take 1 tablet (10 mg total) by mouth at bedtime. 90 tablet 3  . naproxen sodium (ALEVE) 220 MG tablet Take 440 mg by mouth daily as needed (pain).    . Probiotic Product (PROBIOTIC PO) Take 1 tablet by mouth  daily.    . vitamin B-12 (CYANOCOBALAMIN) 1000 MCG tablet Take 1,000 mcg by mouth daily.       Review of Systems: a complete, 10pt review of systems was completed with pertinent positives and negatives as documented in the HPI  Physical Exam: Vitals:   12/06/20 1603 12/06/20 1808  BP: (!) 137/50 (!) 114/58  Pulse: 90 92  Resp: 19 20  Temp:    SpO2: 100% 100%   Gen: A&Ox3, no distress  Eyes: lids and conjunctivae normal, no icterus. Pupils equally round and reactive to light.  Neck: supple without mass or thyromegaly Chest: respiratory effort is normal. No crepitus or tenderness on palpation of the chest.  Symmetrical air entry. Cardiovascular: RRR with palpable distal pulses, no pedal edema Gastrointestinal: soft, nondistended, exquisitely tender in the right upper quadrant with voluntary guarding. No mass, hepatomegaly or splenomegaly. Lymphatic: no lymphadenopathy in the neck or groin Muscoloskeletal: no clubbing or cyanosis of the fingers.  Strength is symmetrical throughout.  Range of motion of bilateral upper and lower extremities normal without pain, crepitation or contracture. Neuro: cranial nerves grossly intact.  Sensation intact to light touch diffusely. Psych: appropriate mood and affect, normal insight/judgment intact  Skin: warm and dry   CBC Latest Ref Rng & Units 12/06/2020 09/22/2020 05/25/2019  WBC 4.0 - 10.5  K/uL 14.9(H) 7.4 7.4  Hemoglobin 12.0 - 15.0 g/dL 12.7 12.2 9.9(L)  Hematocrit 36.0 - 46.0 % 40.0 36 33.0(L)  Platelets 150 - 400 K/uL 305 275 222    CMP Latest Ref Rng & Units 12/06/2020 09/22/2020 10/24/2019  Glucose 70 - 99 mg/dL 120(H) - -  BUN 8 - 23 mg/dL 18 23(A) -  Creatinine 0.44 - 1.00 mg/dL 0.78 - 0.8  Sodium 135 - 145 mmol/L 139 143 -  Potassium 3.5 - 5.1 mmol/L 4.0 4.7 -  Chloride 98 - 111 mmol/L 107 106 -  CO2 22 - 32 mmol/L 22 22 -  Calcium 8.9 - 10.3 mg/dL 9.3 9.3 -  Total Protein 6.5 - 8.1 g/dL 6.5 - -  Total Bilirubin 0.3 - 1.2 mg/dL 0.9 - -  Alkaline Phos 38 - 126 U/L 52 78 -  AST 15 - 41 U/L 24 17 -  ALT 0 - 44 U/L 14 15 -    Lab Results  Component Value Date   INR 0.9 05/20/2019   INR 1.2 02/04/2019    Imaging: CT Abdomen Pelvis W Contrast  Result Date: 12/06/2020 CLINICAL DATA:  Mid abdominal pain since last night, history of diverticulitis. EXAM: CT ABDOMEN AND PELVIS WITH CONTRAST TECHNIQUE: Multidetector CT imaging of the abdomen and pelvis was performed using the standard protocol following bolus administration of intravenous contrast. CONTRAST:  135mL OMNIPAQUE IOHEXOL 300 MG/ML  SOLN COMPARISON:  CT abdomen pelvis Feb 14, 2019 FINDINGS: Lower chest: Partially visualized calcified right breast implant. Subsegmental atelectasis. No pericardial effusion. Hepatobiliary: No suspicious hepatic lesion. Cholelithiasis with a stone in the neck of the gallbladder distended gallbladder gallbladder wall thickening and pericholecystic fluid. There is prominence of the intrahepatic ducts however the common bile duct is within normal limits measuring 5 mm. Pancreas: No evidence of pancreatic inflammation. There is similar prominence of the pancreatic duct measuring up to 3 mm in the body. There is a 5 mm cystic lesion in the body of the pancreas (series 3, image 28). Spleen: Splenic granuloma. Adrenals/Urinary Tract: Adrenal glands are unremarkable. Kidneys are normal, without  renal calculi, focal lesion, or hydronephrosis. Bladder is unremarkable. Stomach/Bowel: Stomach  is unremarkable. No suspicious small bowel wall thickening or dilation. Appendix is visualized in the right lower quadrant with a 3 mm appendicoliths without evidence of adjacent inflammation. Terminal ileum appears unremarkable. Colonic diverticulosis without findings of acute diverticulitis. Anastomotic sutures in the left hemipelvis. Vascular/Lymphatic: Aortic atherosclerosis. No enlarged abdominal or pelvic lymph nodes. Reproductive: Status post hysterectomy. No adnexal masses. Other: No abdominopelvic ascites. Musculoskeletal: Diffuse demineralization of bone. Superior endplate compression deformity at T11 with approximately 35% height loss which is new in comparison to most recent prior CT abdomen pelvis. No significant change in appearance of the superior endplate compression deformity at T10. IMPRESSION: 1. Acute calculus cholecystitis. 2. Colonic diverticulosis WITHOUT findings of acute diverticulitis. 3. Superior endplate compression deformity at T11 with approximately 35% height loss which is technically age indeterminate but appears chronic, recommend correlation with point tenderness. No significant change in appearance of the superior endplate compression deformity at T10. 4. There is a 5 mm cystic lesion in the body of the pancreas with similar prominence of the pancreatic duct, favor side branch IPMN. Recommend follow-up pancreatic protocol MRI with and without contrast for further evaluation. This recommendation follows ACR consensus guidelines: Management of Incidental Pancreatic Cysts: A White Paper of the ACR Incidental Findings Committee. J Am Coll Radiol 8768;11:572-620. 5. Aortic atherosclerosis.  Aortic Atherosclerosis (ICD10-I70.0). These results were called by telephone at the time of interpretation on 12/06/2020 at 4:52 pm to provider MARGAUX VENTER , who verbally acknowledged these results.  Electronically Signed   By: Dahlia Bailiff MD   On: 12/06/2020 16:53   US Abdomen Limited RUQ (LIVER/GB)  Result Date: 12/06/2020 CLINICAL DATA:  Concern for acute cholecystitis. EXAM: ULTRASOUND ABDOMEN LIMITED RIGHT UPPER QUADRANT COMPARISON:  Same day CT abdomen pelvis. FINDINGS: Gallbladder: Gallbladder is enlarged with a thickened wall measuring 5 mm and pericholecystic fluid. There are cholelithiasis measuring up to 1.4 cm with biliary sludge. Positive sonographic Murphy sign noted by sonographer. Common bile duct: Diameter: 9 mm Liver: No focal lesion identified. Within normal limits in parenchymal echogenicity. Portal vein is patent on color Doppler imaging with normal direction of blood flow towards the liver. Other: None. IMPRESSION: 1. Sonographic findings consistent with acute calculus cholecystitis. 2. Dilated common bile duct measuring 9 mm, without visualized filling defect. If clinical concern for choledocholithiasis recommend ERCP/MRCP. Electronically Signed   By: Dahlia Bailiff MD   On: 12/06/2020 18:03     A/P: Acute calculus cholecystitis by CT and ultrasound.  Minimally dilated common bile duct, LFTs are normal. I recommend proceeding with laparoscopic cholecystectomy with intraoperative cholangiogram. Discussed technique and risks of surgery including bleeding, pain, scarring, intraabdominal injury specifically to the common bile duct and sequelae, bile leak, conversion to open surgery, blood clot, pneumonia, heart attack, stroke, failure to resolve symptoms, etc. Questions welcomed and answered.  Will plan for surgery tomorrow with Dr. Kieth Brightly.  Admit to med-surg for IV fluid resuscitation, empiric broad-spectrum antibiotics, pain and nausea control.  Discussed with patient that depending on timing of surgery and Intra-Op/postoperative course, she may be able to go home tomorrow evening.    Patient Active Problem List   Diagnosis Date Noted  . Benign essential HTN 04/12/2020   . GERD (gastroesophageal reflux disease) 03/12/2020  . Osteopenia 03/12/2020  . Status post laparoscopic-assisted sigmoidectomy 05/22/2019  . 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 Asthma 01/26/2019  . Sjogren's syndrome ([place]) 01/26/2019  . Diverticulitis of large intestine with perforation 01/25/2019  Romana Juniper, MD [facility], [place]  See AMION to contact appropriate on-call provider

## 2020-12-06 NOTE — ED Notes (Signed)
Delay in starting Zosyn d/t trying to get blood cultures.

## 2020-12-06 NOTE — ED Triage Notes (Signed)
Pt began to have mid abd pain last night with a lot of pressure. Hx of diverticulitis.

## 2020-12-06 NOTE — ED Notes (Signed)
Attempted report 

## 2020-12-07 ENCOUNTER — Encounter (HOSPITAL_COMMUNITY): Payer: Self-pay

## 2020-12-07 ENCOUNTER — Encounter (HOSPITAL_COMMUNITY): Admission: EM | Disposition: A | Discharge status: Home / Self Care | Payer: Self-pay | Source: Home / Self Care

## 2020-12-07 ENCOUNTER — Observation Stay (HOSPITAL_COMMUNITY): Payer: Medicare Other

## 2020-12-07 ENCOUNTER — Observation Stay (HOSPITAL_COMMUNITY): Payer: Medicare Other | Admitting: Certified Registered Nurse Anesthetist

## 2020-12-07 DIAGNOSIS — K8000 Calculus of gallbladder with acute cholecystitis without obstruction: Secondary | ICD-10-CM | POA: Diagnosis present

## 2020-12-07 DIAGNOSIS — J45909 Unspecified asthma, uncomplicated: Secondary | ICD-10-CM | POA: Diagnosis present

## 2020-12-07 DIAGNOSIS — Z823 Family history of stroke: Secondary | ICD-10-CM | POA: Diagnosis not present

## 2020-12-07 DIAGNOSIS — Z20822 Contact with and (suspected) exposure to covid-19: Secondary | ICD-10-CM | POA: Diagnosis present

## 2020-12-07 DIAGNOSIS — Z79899 Other long term (current) drug therapy: Secondary | ICD-10-CM | POA: Diagnosis not present

## 2020-12-07 DIAGNOSIS — Z8249 Family history of ischemic heart disease and other diseases of the circulatory system: Secondary | ICD-10-CM | POA: Diagnosis not present

## 2020-12-07 DIAGNOSIS — Z48815 Encounter for surgical aftercare following surgery on the digestive system: Secondary | ICD-10-CM | POA: Diagnosis not present

## 2020-12-07 DIAGNOSIS — K82A1 Gangrene of gallbladder in cholecystitis: Secondary | ICD-10-CM | POA: Diagnosis not present

## 2020-12-07 DIAGNOSIS — Z91040 Latex allergy status: Secondary | ICD-10-CM | POA: Diagnosis not present

## 2020-12-07 DIAGNOSIS — I10 Essential (primary) hypertension: Secondary | ICD-10-CM | POA: Diagnosis present

## 2020-12-07 DIAGNOSIS — Z803 Family history of malignant neoplasm of breast: Secondary | ICD-10-CM | POA: Diagnosis not present

## 2020-12-07 DIAGNOSIS — M3500 Sicca syndrome, unspecified: Secondary | ICD-10-CM | POA: Diagnosis present

## 2020-12-07 DIAGNOSIS — M810 Age-related osteoporosis without current pathological fracture: Secondary | ICD-10-CM | POA: Diagnosis present

## 2020-12-07 DIAGNOSIS — K219 Gastro-esophageal reflux disease without esophagitis: Secondary | ICD-10-CM | POA: Diagnosis present

## 2020-12-07 DIAGNOSIS — Z7982 Long term (current) use of aspirin: Secondary | ICD-10-CM | POA: Diagnosis not present

## 2020-12-07 HISTORY — PX: CHOLECYSTECTOMY: SHX55

## 2020-12-07 LAB — CBC
HCT: 39.2 % (ref 36.0–46.0)
Hemoglobin: 12.4 g/dL (ref 12.0–15.0)
MCH: 28.7 pg (ref 26.0–34.0)
MCHC: 31.6 g/dL (ref 30.0–36.0)
MCV: 90.7 fL (ref 80.0–100.0)
Platelets: 244 10*3/uL (ref 150–400)
RBC: 4.32 MIL/uL (ref 3.87–5.11)
RDW: 12.9 % (ref 11.5–15.5)
WBC: 17.6 10*3/uL — ABNORMAL HIGH (ref 4.0–10.5)
nRBC: 0 % (ref 0.0–0.2)

## 2020-12-07 LAB — SURGICAL PCR SCREEN
MRSA, PCR: NEGATIVE
Staphylococcus aureus: NEGATIVE

## 2020-12-07 LAB — COMPREHENSIVE METABOLIC PANEL
ALT: 17 U/L (ref 0–44)
AST: 17 U/L (ref 15–41)
Albumin: 3.6 g/dL (ref 3.5–5.0)
Alkaline Phosphatase: 50 U/L (ref 38–126)
Anion gap: 11 (ref 5–15)
BUN: 14 mg/dL (ref 8–23)
CO2: 22 mmol/L (ref 22–32)
Calcium: 9 mg/dL (ref 8.9–10.3)
Chloride: 108 mmol/L (ref 98–111)
Creatinine, Ser: 0.8 mg/dL (ref 0.44–1.00)
GFR, Estimated: >60 mL/min (ref >60)
Glucose, Bld: 101 mg/dL — ABNORMAL HIGH (ref 70–99)
Potassium: 3.9 mmol/L (ref 3.5–5.1)
Sodium: 141 mmol/L (ref 135–145)
Total Bilirubin: 0.9 mg/dL (ref 0.3–1.2)
Total Protein: 6.3 g/dL — ABNORMAL LOW (ref 6.5–8.1)

## 2020-12-07 IMAGING — RF DG CHOLANGIOGRAM OPERATIVE
1 series · 4 of 4 positions shown · non-contrast
Comparison: None.

CLINICAL DATA: 74-year-old female with cholelithiasis

EXAM:
INTRAOPERATIVE CHOLANGIOGRAM
TECHNIQUE: Cholangiographic images from the C-arm fluoroscopic device were
submitted for interpretation post-operatively. Please see the
procedural report for the amount of contrast and the fluoroscopy
time utilized.

[Series 1: unknown protocol · 0.14mm/px · 4 of 63 frames shown]
[frame 1/63]
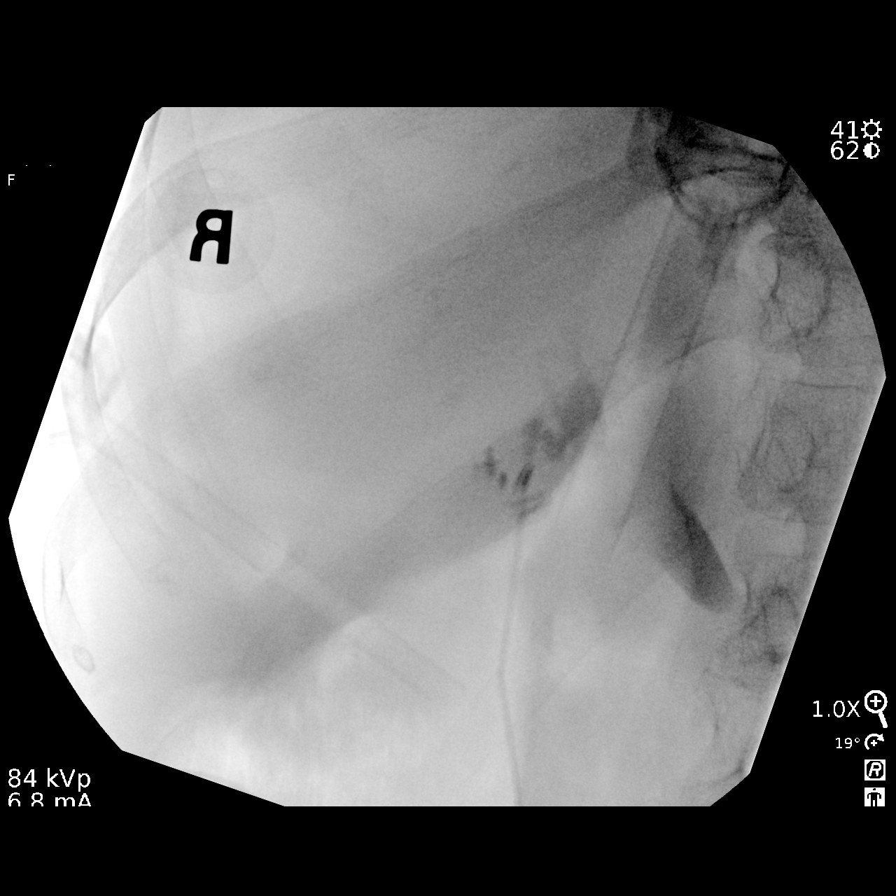
[frame 10/63]
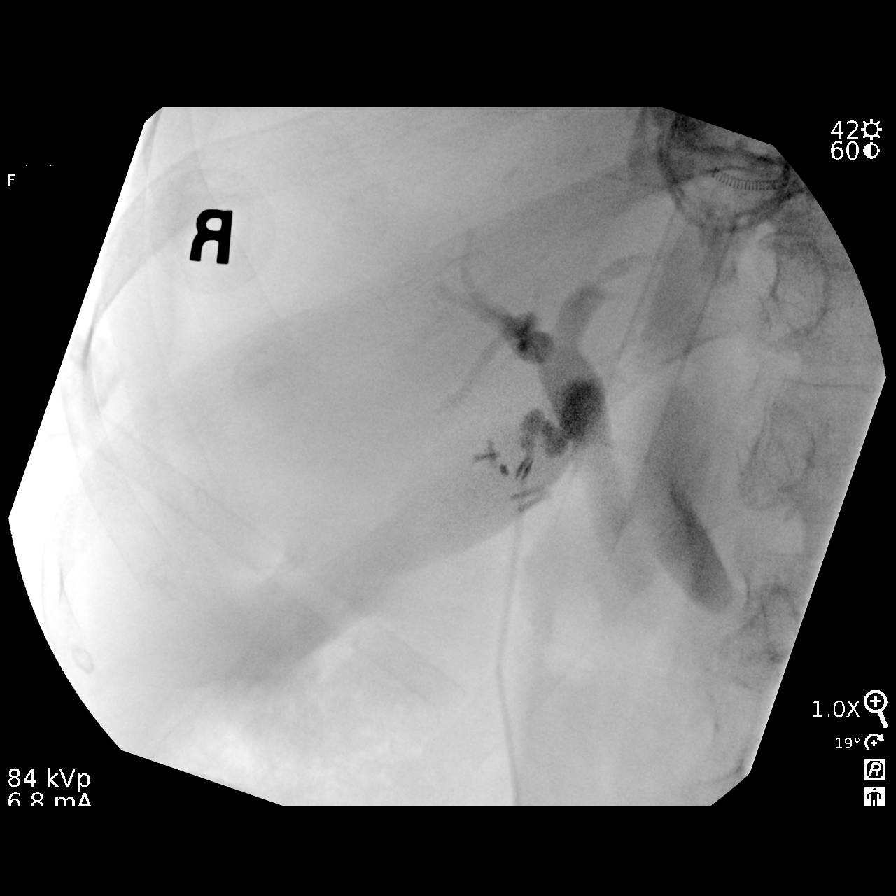
[frame 32/63]
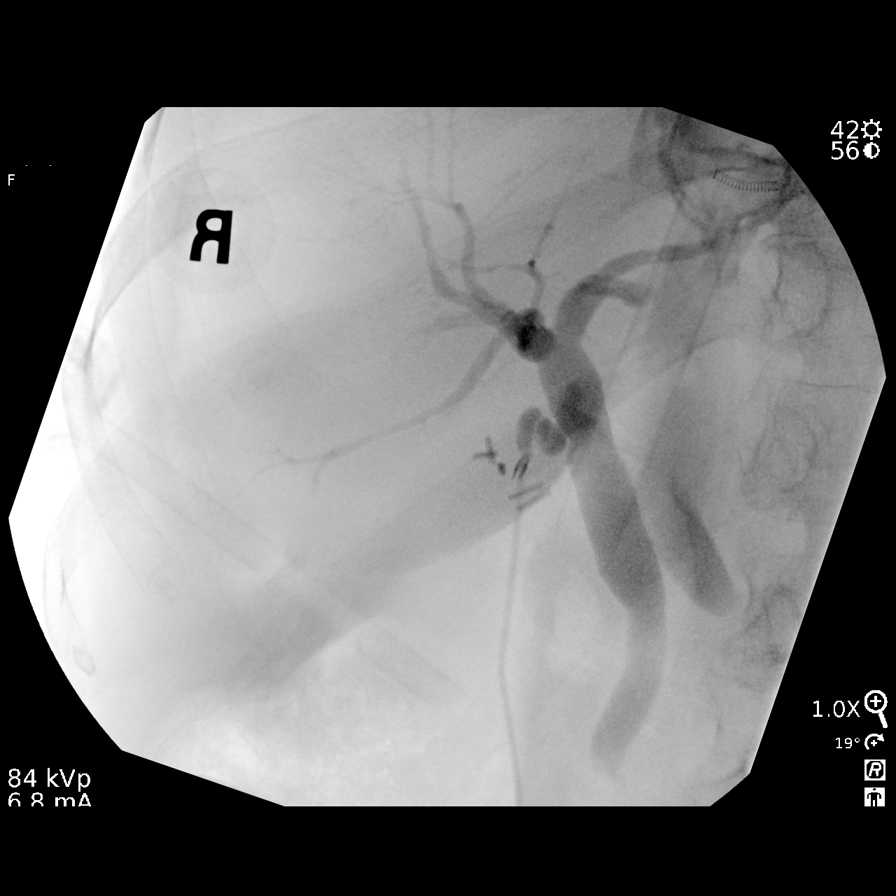
[frame 54/63]
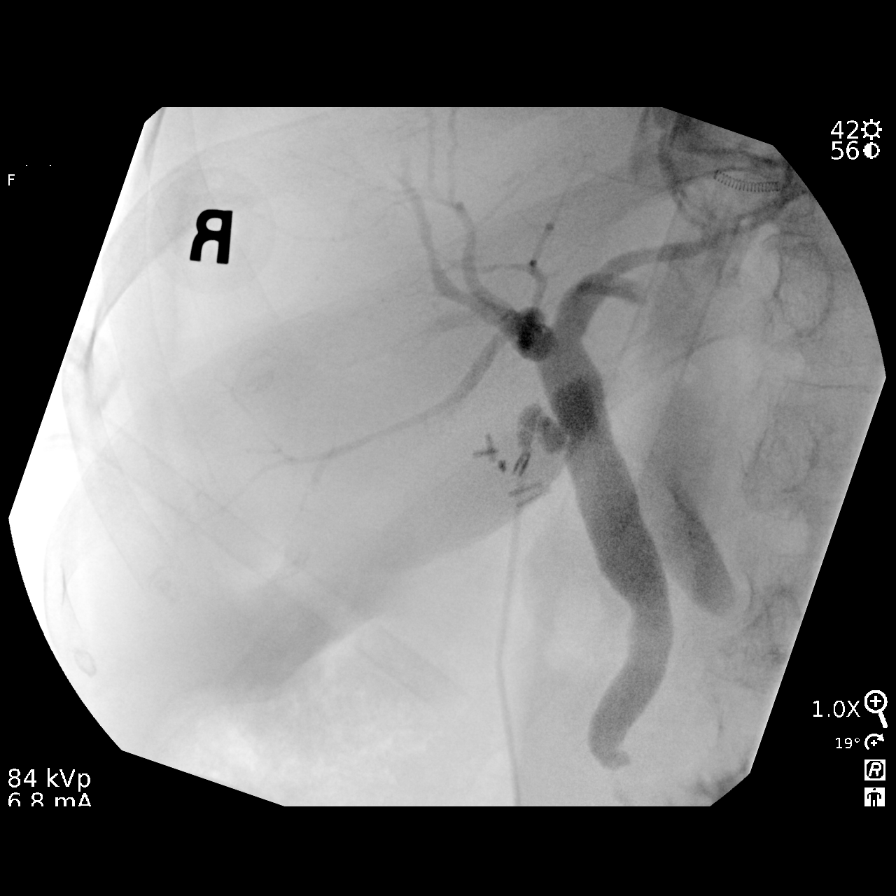

[4 of 4 positions shown; findings below may reference images not displayed]

FINDINGS: Surgical instruments project over the upper abdomen.

There is cannulation of the cystic duct/gallbladder neck, with
antegrade infusion of contrast. Caliber of the extrahepatic ductal
system within normal limits.

No definite filling defect within the extrahepatic ducts identified.

Free flow of contrast across the ampulla.
IMPRESSION: Intraoperative cholangiogram demonstrates extrahepatic biliary ducts
of unremarkable caliber, with no definite filling defects
identified. Free flow of contrast across the ampulla.

Please refer to the dictated operative report for full details of
intraoperative findings and procedure

## 2020-12-07 SURGERY — LAPAROSCOPIC CHOLECYSTECTOMY WITH INTRAOPERATIVE CHOLANGIOGRAM
Anesthesia: General | Site: Abdomen

## 2020-12-07 MED ORDER — SUGAMMADEX SODIUM 200 MG/2ML IV SOLN
INTRAVENOUS | Status: DC | PRN
  Administered 2020-12-07: 200 mg via INTRAVENOUS

## 2020-12-07 MED ORDER — 0.9 % SODIUM CHLORIDE (POUR BTL) OPTIME
TOPICAL | Status: DC | PRN
  Administered 2020-12-07: 50 mL

## 2020-12-07 MED ORDER — SODIUM CHLORIDE 0.9 % IV SOLN
INTRAVENOUS | Status: DC | PRN
  Administered 2020-12-07: 11 mL

## 2020-12-07 MED ORDER — FENTANYL CITRATE (PF) 250 MCG/5ML IJ SOLN
INTRAMUSCULAR | Status: AC
  Filled 2020-12-07: qty 5

## 2020-12-07 MED ORDER — BUPIVACAINE HCL (PF) 0.25 % IJ SOLN
INTRAMUSCULAR | Status: AC
  Filled 2020-12-07: qty 30

## 2020-12-07 MED ORDER — LACTATED RINGERS IV SOLN: INTRAVENOUS | Status: DC

## 2020-12-07 MED ORDER — PHENYLEPHRINE HCL (PRESSORS) 10 MG/ML IV SOLN
INTRAVENOUS | Status: DC | PRN
  Administered 2020-12-07 (×2): 80 ug via INTRAVENOUS

## 2020-12-07 MED ORDER — AMISULPRIDE (ANTIEMETIC) 5 MG/2ML IV SOLN: 10.0000 mg | Freq: Once | INTRAVENOUS | Status: DC | PRN

## 2020-12-07 MED ORDER — PROPOFOL 10 MG/ML IV BOLUS
INTRAVENOUS | Status: DC | PRN
  Administered 2020-12-07: 50 mg via INTRAVENOUS
  Administered 2020-12-07: 100 mg via INTRAVENOUS
  Administered 2020-12-07: 20 mg via INTRAVENOUS

## 2020-12-07 MED ORDER — FENTANYL CITRATE (PF) 100 MCG/2ML IJ SOLN
25.0000 ug | INTRAMUSCULAR | Status: DC | PRN
Start: 2020-12-07 — End: 2020-12-07

## 2020-12-07 MED ORDER — MIDAZOLAM HCL 2 MG/2ML IJ SOLN
INTRAMUSCULAR | Status: AC
  Filled 2020-12-07: qty 2

## 2020-12-07 MED ORDER — ENOXAPARIN SODIUM 40 MG/0.4ML ~~LOC~~ SOLN
40.0000 mg | SUBCUTANEOUS | Status: DC
  Administered 2020-12-08: 40 mg via SUBCUTANEOUS
  Filled 2020-12-07: qty 0.4

## 2020-12-07 MED ORDER — FENTANYL CITRATE (PF) 250 MCG/5ML IJ SOLN
INTRAMUSCULAR | Status: DC | PRN
  Administered 2020-12-07 (×3): 50 ug via INTRAVENOUS
  Administered 2020-12-07: 100 ug via INTRAVENOUS

## 2020-12-07 MED ORDER — ROCURONIUM BROMIDE 10 MG/ML (PF) SYRINGE
PREFILLED_SYRINGE | INTRAVENOUS | Status: DC | PRN
  Administered 2020-12-07: 40 mg via INTRAVENOUS

## 2020-12-07 MED ORDER — SODIUM CHLORIDE 0.9 % IR SOLN
Status: DC | PRN
  Administered 2020-12-07: 500 mL

## 2020-12-07 MED ORDER — ONDANSETRON HCL 4 MG/2ML IJ SOLN
INTRAMUSCULAR | Status: DC | PRN
  Administered 2020-12-07: 4 mg via INTRAVENOUS

## 2020-12-07 MED ORDER — CHLORHEXIDINE GLUCONATE 0.12 % MT SOLN: 15.0000 mL | Freq: Once | OROMUCOSAL | Status: AC

## 2020-12-07 MED ORDER — MONTELUKAST SODIUM 10 MG PO TABS
10.0000 mg | ORAL_TABLET | Freq: Every day | ORAL | Status: DC
  Administered 2020-12-07: 10 mg via ORAL
  Filled 2020-12-07: qty 1

## 2020-12-07 MED ORDER — DEXAMETHASONE SODIUM PHOSPHATE 10 MG/ML IJ SOLN
INTRAMUSCULAR | Status: DC | PRN
  Administered 2020-12-07: 10 mg via INTRAVENOUS

## 2020-12-07 MED ORDER — BUPIVACAINE HCL 0.25 % IJ SOLN
INTRAMUSCULAR | Status: DC | PRN
  Administered 2020-12-07: 30 mL

## 2020-12-07 MED ORDER — HYDROMORPHONE HCL 1 MG/ML IJ SOLN: 0.5000 mg | INTRAMUSCULAR | Status: DC | PRN

## 2020-12-07 MED ORDER — PHENYLEPHRINE 40 MCG/ML (10ML) SYRINGE FOR IV PUSH (FOR BLOOD PRESSURE SUPPORT)
PREFILLED_SYRINGE | INTRAVENOUS | Status: DC | PRN
  Administered 2020-12-07 (×2): 80 ug via INTRAVENOUS
  Administered 2020-12-07: 120 ug via INTRAVENOUS

## 2020-12-07 MED ORDER — SODIUM CHLORIDE 0.9 % IV SOLN: INTRAVENOUS | Status: DC

## 2020-12-07 MED ORDER — LIDOCAINE 2% (20 MG/ML) 5 ML SYRINGE
INTRAMUSCULAR | Status: DC | PRN
  Administered 2020-12-07: 60 mg via INTRAVENOUS

## 2020-12-07 MED ORDER — SUCCINYLCHOLINE CHLORIDE 200 MG/10ML IV SOSY
PREFILLED_SYRINGE | INTRAVENOUS | Status: DC | PRN
  Administered 2020-12-07: 80 mg via INTRAVENOUS

## 2020-12-07 MED ORDER — CHLORHEXIDINE GLUCONATE 0.12 % MT SOLN
OROMUCOSAL | Status: AC
  Administered 2020-12-07: 15 mL via OROMUCOSAL
  Filled 2020-12-07: qty 15

## 2020-12-07 SURGICAL SUPPLY — 40 items
APPLIER CLIP ROT 10 11.4 M/L (STAPLE) ×2
BLADE CLIPPER SURG (BLADE) IMPLANT
CANISTER SUCT 3000ML PPV (MISCELLANEOUS) ×2 IMPLANT
CATH CHOLANG 76X19 KUMAR (CATHETERS) ×2 IMPLANT
CHLORAPREP W/TINT 26 (MISCELLANEOUS) ×2 IMPLANT
CLIP APPLIE ROT 10 11.4 M/L (STAPLE) ×1 IMPLANT
CLIP VESOLOCK MED LG 6/CT (CLIP) IMPLANT
COVER MAYO STAND STRL (DRAPES) ×2 IMPLANT
COVER SURGICAL LIGHT HANDLE (MISCELLANEOUS) ×2 IMPLANT
COVER WAND RF STERILE (DRAPES) IMPLANT
DERMABOND ADVANCED (GAUZE/BANDAGES/DRESSINGS) ×1
DERMABOND ADVANCED .7 DNX12 (GAUZE/BANDAGES/DRESSINGS) ×1 IMPLANT
DRAPE C-ARM 42X120 X-RAY (DRAPES) ×2 IMPLANT
ELECT REM PT RETURN 9FT ADLT (ELECTROSURGICAL) ×2
ELECTRODE REM PT RTRN 9FT ADLT (ELECTROSURGICAL) ×1 IMPLANT
GLOVE SURG SS PI 7.0 STRL IVOR (GLOVE) ×2 IMPLANT
GLOVE SURG UNDER POLY LF SZ7 (GLOVE) ×2 IMPLANT
GOWN STRL REUS W/ TWL LRG LVL3 (GOWN DISPOSABLE) ×3 IMPLANT
GOWN STRL REUS W/TWL LRG LVL3 (GOWN DISPOSABLE) ×3
GRASPER SUT TROCAR 14GX15 (MISCELLANEOUS) ×2 IMPLANT
KIT BASIN OR (CUSTOM PROCEDURE TRAY) ×2 IMPLANT
KIT TURNOVER KIT B (KITS) ×2 IMPLANT
NEEDLE 22X1 1/2 (OR ONLY) (NEEDLE) ×2 IMPLANT
NS IRRIG 1000ML POUR BTL (IV SOLUTION) ×2 IMPLANT
PAD ARMBOARD 7.5X6 YLW CONV (MISCELLANEOUS) ×2 IMPLANT
POUCH RETRIEVAL ECOSAC 10 (ENDOMECHANICALS) ×2 IMPLANT
POUCH RETRIEVAL ECOSAC 10MM (ENDOMECHANICALS) ×2
SCISSORS LAP 5X35 DISP (ENDOMECHANICALS) ×2 IMPLANT
SET IRRIG TUBING LAPAROSCOPIC (IRRIGATION / IRRIGATOR) ×2 IMPLANT
SET TUBE SMOKE EVAC HIGH FLOW (TUBING) ×2 IMPLANT
SLEEVE ENDOPATH XCEL 5M (ENDOMECHANICALS) ×4 IMPLANT
SPECIMEN JAR SMALL (MISCELLANEOUS) ×2 IMPLANT
STOPCOCK 4 WAY LG BORE MALE ST (IV SETS) ×2 IMPLANT
SUT MNCRL AB 4-0 PS2 18 (SUTURE) ×2 IMPLANT
TOWEL GREEN STERILE (TOWEL DISPOSABLE) ×2 IMPLANT
TOWEL GREEN STERILE FF (TOWEL DISPOSABLE) ×2 IMPLANT
TRAY LAPAROSCOPIC MC (CUSTOM PROCEDURE TRAY) ×2 IMPLANT
TROCAR XCEL 12X100 BLDLESS (ENDOMECHANICALS) ×2 IMPLANT
TROCAR XCEL NON-BLD 5MMX100MML (ENDOMECHANICALS) ×2 IMPLANT
WATER STERILE IRR 1000ML POUR (IV SOLUTION) ×2 IMPLANT

## 2020-12-07 NOTE — Anesthesia Preprocedure Evaluation (Signed)
Anesthesia Evaluation  Patient identified by MRN, date of birth, ID band Patient awake    Reviewed: Allergy & Precautions, NPO status , Patient's Chart, lab work & pertinent test results  Airway Mallampati: II  TM Distance: >3 FB Neck ROM: Full    Dental  (+) Dental Advisory Given   Pulmonary asthma ,    breath sounds clear to auscultation       Cardiovascular hypertension,  Rhythm:Regular Rate:Normal     Neuro/Psych negative neurological ROS     GI/Hepatic Neg liver ROS, GERD  ,  Endo/Other  negative endocrine ROS  Renal/GU negative Renal ROS     Musculoskeletal   Abdominal   Peds  Hematology negative hematology ROS (+)   Anesthesia Other Findings   Reproductive/Obstetrics                             Anesthesia Physical Anesthesia Plan  ASA: II  Anesthesia Plan: General   Post-op Pain Management:    Induction: Intravenous  PONV Risk Score and Plan: 3 and Ondansetron, Dexamethasone and Treatment may vary due to age or medical condition  Airway Management Planned: Oral ETT  Additional Equipment: None  Intra-op Plan:   Post-operative Plan: Extubation in OR  Informed Consent: I have reviewed the patients History and Physical, chart, labs and discussed the procedure including the risks, benefits and alternatives for the proposed anesthesia with the patient or authorized representative who has indicated his/her understanding and acceptance.     Dental advisory given  Plan Discussed with: CRNA  Anesthesia Plan Comments:         Anesthesia Quick Evaluation

## 2020-12-07 NOTE — Transfer of Care (Signed)
Immediate Anesthesia Transfer of Care Note  Patient: Denise Payne  Procedure(s) Performed: LAPAROSCOPIC CHOLECYSTECTOMY WITH INTRAOPERATIVE CHOLANGIOGRAM (N/A Abdomen)  Patient Location: PACU  Anesthesia Type:General  Level of Consciousness: drowsy, patient cooperative and responds to stimulation  Airway & Oxygen Therapy: Patient Spontanous Breathing  Post-op Assessment: Report given to RN and Post -op Vital signs reviewed and stable  Post vital signs: Reviewed and stable  Last Vitals:  Vitals Value Taken Time  BP 135/83 12/07/20 1144  Temp    Pulse 107 12/07/20 1145  Resp 19 12/07/20 1145  SpO2 96 % 12/07/20 1145  Vitals shown include unvalidated device data.  Last Pain:  Vitals:   12/07/20 0600  TempSrc:   PainSc: Asleep         Complications: No complications documented.

## 2020-12-07 NOTE — Op Note (Signed)
PATIENT:  Denise Payne  75 y.o. female  PRE-OPERATIVE DIAGNOSIS:  cholecystitis  POST-OPERATIVE DIAGNOSIS:  cholecystitis  PROCEDURE:  Procedure(s): LAPAROSCOPIC CHOLECYSTECTOMY WITH INTRAOPERATIVE CHOLANGIOGRAM   SURGEON:  Surgeon(s): Nahlia Hellmann, Arta Bruce, MD  ASSISTANT: Melina Modena  ANESTHESIA:   local and general  Indications for procedure: Denise Payne is a 75 y.o. female with symptoms of Abdominal pain and Nausea and vomiting consistent with gallbladder disease, Confirmed by ultrasound and CT.  Description of procedure: The patient was brought into the operative suite, placed supine. Anesthesia was administered with endotracheal tube. Patient was strapped in place and foot board was secured. All pressure points were offloaded by foam padding. The patient was prepped and draped in the usual sterile fashion.  A periumbilical incision was made and optical entry was used to enter the abdomen. 2 5 mm trocars were placed on in the right lateral space on in the right subcostal space. A 79mm trocar was placed in the subxiphoid space. Marcaine was infused to the subxiphoid space and lateral upper right abdomen in the transversus abdominis plane. Next the patient was placed in reverse trendelenberg. The gallbladder appeareddilated and acutely inflamed. Omentum was adhered to the gallbladder and was taken down with cautery/blunt dissection.  The gallbladder was retracted cephalad and lateral. The peritoneum was reflected off the infundibulum working lateral to medial. The cystic duct and cystic artery were identified and further dissection revealed a critical view, due to concern for choledocholithiasis a cholangiogram was performed with ductotomy and cook catheter passed through a separate subcostal stab incision. The CBD was dilated but emptied quickly into the duodenum without filling defect. The cystic duct and cystic artery were doubly clipped and ligated.   The gallbladder was removed  off the liver bed with cautery. The Gallbladder was placed in a specimen bag. The gallbladder fossa was irrigated and hemostasis was applied with cautery. The gallbladder was removed via the 2mm trocar. The fascial defect was closed with interrupted 0 vicryl suture via laparoscopic trans-fascial suture passer. Pneumoperitoneum was removed, all trocar were removed. All incisions were closed with 4-0 monocryl subcuticular stitch. The patient woke from anesthesia and was brought to PACU in stable condition. All counts were correct  Findings: acute cholecystitis, normal ductal anatomy  Specimen: gallbladder  Blood loss: 20 ml  Local anesthesia: 30 ml Marcaine  Complications: none  PLAN OF CARE: Admit to inpatient   PATIENT DISPOSITION:  PACU - hemodynamically stable.  Images:      Gurney Maxin, M.D. General, Bariatric, & Minimally Invasive Surgery [facility] [place]

## 2020-12-07 NOTE — Anesthesia Procedure Notes (Signed)
Procedure Name: Intubation Date/Time: 12/07/2020 10:23 AM Performed by: Janace Litten, CRNA Pre-anesthesia Checklist: Patient identified, Emergency Drugs available, Suction available and Patient being monitored Patient Re-evaluated:Patient Re-evaluated prior to induction Oxygen Delivery Method: Circle System Utilized Preoxygenation: Pre-oxygenation with 100% oxygen Induction Type: IV induction and Rapid sequence Ventilation: Mask ventilation without difficulty Laryngoscope Size: Miller and 2 Grade View: Grade II Tube type: Oral Tube size: 7.0 mm Number of attempts: 1 Airway Equipment and Method: Stylet,  Oral airway and Bougie stylet Placement Confirmation: ETT inserted through vocal cords under direct vision,  positive ETCO2 and breath sounds checked- equal and bilateral Secured at: 22 cm Tube secured with: Tape Dental Injury: Teeth and Oropharynx as per pre-operative assessment  Comments: DL x1 by CRNA with MAC 3, grade III view, esophageal intubation immediately recognized; Mask ventilated between attempt; DL x2 by MDA with Miller 2, grade II view, bougie stylette used.

## 2020-12-07 NOTE — Progress Notes (Addendum)
Pre Procedure note for inpatients:   Denise Payne has been scheduled for laparoscopic cholecystectomy with intraoperative cholangiogram today. The various methods of treatment have been discussed with the patient. After consideration of the risks, benefits and treatment options the patient has consented to the planned procedure.   The patient has been seen and labs reviewed. There are no changes in the patient's condition to prevent proceeding with the planned procedure today.  Recent labs:  Lab Results  Component Value Date   WBC 17.6 (H) 12/07/2020   HGB 12.4 12/07/2020   HCT 39.2 12/07/2020   PLT 244 12/07/2020   GLUCOSE 101 (H) 12/07/2020   ALT 17 12/07/2020   AST 17 12/07/2020   NA 141 12/07/2020   K 3.9 12/07/2020   CL 108 12/07/2020   CREATININE 0.80 12/07/2020   BUN 14 12/07/2020   CO2 22 12/07/2020   INR 0.9 05/20/2019   HGBA1C 5.1 05/20/2019    Mickeal Skinner, MD 12/07/2020 8:37 AM

## 2020-12-07 NOTE — Anesthesia Postprocedure Evaluation (Signed)
Anesthesia Post Note  Patient: Denise Payne  Procedure(s) Performed: LAPAROSCOPIC CHOLECYSTECTOMY WITH INTRAOPERATIVE CHOLANGIOGRAM (N/A Abdomen)     Patient location during evaluation: PACU Anesthesia Type: General Level of consciousness: awake and alert Pain management: pain level controlled Vital Signs Assessment: post-procedure vital signs reviewed and stable Respiratory status: spontaneous breathing, nonlabored ventilation, respiratory function stable and patient connected to nasal cannula oxygen Cardiovascular status: blood pressure returned to baseline and stable Postop Assessment: no apparent nausea or vomiting Anesthetic complications: no   No complications documented.  Last Vitals:  Vitals:   12/07/20 1208 12/07/20 1210  BP: (!) 123/58 (!) 123/58  Pulse: 91 92  Resp: 16 16  Temp:  (!) 36.2 C  SpO2: 95% 94%    Last Pain:  Vitals:   12/07/20 1145  TempSrc:   PainSc: 0-No pain                 Tiajuana Amass

## 2020-12-07 NOTE — Discharge Instructions (Signed)
[facility]  Please arrive at least 30 min before your appointment to complete your check in paperwork.  If you are unable to arrive 30 min prior to your appointment time we may have to cancel or reschedule you. LAPAROSCOPIC SURGERY: POST OP INSTRUCTIONS Always review your discharge instruction sheet given to you by the facility where your surgery was performed. IF YOU HAVE DISABILITY OR FAMILY LEAVE FORMS, YOU MUST BRING THEM TO THE OFFICE FOR PROCESSING.   DO NOT GIVE THEM TO YOUR DOCTOR.  PAIN CONTROL  1. First take acetaminophen (Tylenol) AND/or ibuprofen (Advil) to control your pain after surgery.  Follow directions on package.  Taking acetaminophen (Tylenol) and/or ibuprofen (Advil) regularly after surgery will help to control your pain and lower the amount of prescription pain medication you may need.  You should not take more than 4,000 mg (4 grams) of acetaminophen (Tylenol) in 24 hours.  You should not take ibuprofen (Advil), aleve, motrin, naprosyn or other NSAIDS if you have a history of stomach ulcers or chronic kidney disease.  2. A prescription for pain medication may be given to you upon discharge.  Take your pain medication as prescribed, if you still have uncontrolled pain after taking acetaminophen (Tylenol) or ibuprofen (Advil). 3. Use ice packs to help control pain. 4. If you need a refill on your pain medication, please contact your pharmacy.  They will contact our office to request authorization. Prescriptions will not be filled after 5pm or on week-ends.  HOME MEDICATIONS 5. Take your usually prescribed medications unless otherwise directed.  DIET 6. You should follow a light diet the first few days after arrival home.  Be sure to include lots of fluids daily. Avoid fatty, fried foods.   CONSTIPATION 7. It is common to experience some constipation after surgery and if you are taking pain medication.  Increasing fluid intake and taking a stool  softener (such as Colace) will usually help or prevent this problem from occurring.  A mild laxative (Milk of Magnesia or Miralax) should be taken according to package instructions if there are no bowel movements after 48 hours.  WOUND/INCISION CARE 8. Most patients will experience some swelling and bruising in the area of the incisions.  Ice packs will help.  Swelling and bruising can take several days to resolve.  9. Unless discharge instructions indicate otherwise, follow guidelines below  a. STERI-STRIPS - you may remove your outer bandages 48 hours after surgery, and you may shower at that time.  You have steri-strips (small skin tapes) in place directly over the incision.  These strips should be left on the skin for 7-10 days.   b. DERMABOND/SKIN GLUE - you may shower in 24 hours.  The glue will flake off over the next 2-3 weeks. 10. Any sutures or staples will be removed at the office during your follow-up visit.  ACTIVITIES 11. You may resume regular (light) daily activities beginning the next day--such as daily self-care, walking, climbing stairs--gradually increasing activities as tolerated.  You may have sexual intercourse when it is comfortable.  Refrain from any heavy lifting or straining until approved by your doctor. a. You may drive when you are no longer taking prescription pain medication, you can comfortably wear a seatbelt, and you can safely maneuver your car and apply brakes.  FOLLOW-UP 12. You should see your doctor in the office for a follow-up appointment approximately 2-3 weeks after your surgery.  You should have been given your post-op/follow-up appointment when   your surgery was scheduled.  If you did not receive a post-op/follow-up appointment, make sure that you call for this appointment within a day or two after you arrive home to insure a convenient appointment time.   WHEN TO CALL YOUR DOCTOR: 1. Fever over 101.0 2. Inability to urinate 3. Continued bleeding from  incision. 4. Increased pain, redness, or drainage from the incision. 5. Increasing abdominal pain  The clinic staff is available to answer your questions during regular business hours.  Please don't hesitate to call and ask to speak to one of the nurses for clinical concerns.  If you have a medical emergency, go to the nearest emergency room or call 911.  A surgeon from [facility] is always on call at the hospital. [address], [address], [place], [place]  [address] ? [address] [address], [place], [place]   [address] [phone] ? [phone] ? FAX [phone]  .........   Managing Your Pain After Surgery Without Opioids    Thank you for participating in our program to help patients manage their pain after surgery without opioids. This is part of our effort to provide you with the best care possible, without exposing you or your family to the risk that opioids pose.  What pain can I expect after surgery? You can expect to have some pain after surgery. This is normal. The pain is typically worse the day after surgery, and quickly begins to get better. Many studies have found that many patients are able to manage their pain after surgery with Over-the-Counter (OTC) medications such as Tylenol and Motrin. If you have a condition that does not allow you to take Tylenol or Motrin, notify your surgical team.  How will I manage my pain? The best strategy for controlling your pain after surgery is around the clock pain control with Tylenol (acetaminophen) and Motrin (ibuprofen or Advil). Alternating these medications with each other allows you to maximize your pain control. In addition to Tylenol and Motrin, you can use heating pads or ice packs on your incisions to help reduce your pain.  How will I alternate your regular strength over-the-counter pain medication? You will take a dose of pain medication every three hours. ; Start by taking 650 mg of Tylenol (2 pills of 325  mg) ; 3 hours later take 600 mg of Motrin (3 pills of 200 mg) ; 3 hours after taking the Motrin take 650 mg of Tylenol ; 3 hours after that take 600 mg of Motrin.   - 1 -  See example - if your first dose of Tylenol is at 12:00 PM   12:00 PM Tylenol 650 mg (2 pills of 325 mg)  3:00 PM Motrin 600 mg (3 pills of 200 mg)  6:00 PM Tylenol 650 mg (2 pills of 325 mg)  9:00 PM Motrin 600 mg (3 pills of 200 mg)  Continue alternating every 3 hours   We recommend that you follow this schedule around-the-clock for at least 3 days after surgery, or until you feel that it is no longer needed. Use the table on the last page of this handout to keep track of the medications you are taking. Important: Do not take more than 3000mg of Tylenol or 3200mg of Motrin in a 24-hour period. Do not take ibuprofen/Motrin if you have a history of bleeding stomach ulcers, severe kidney disease, &/or actively taking a blood thinner  What if I still have pain? If you have pain that is not   controlled with the over-the-counter pain medications (Tylenol and Motrin or Advil) you might have what we call "breakthrough" pain. You will receive a prescription for a small amount of an opioid pain medication such as Oxycodone, Tramadol, or Tylenol with Codeine. Use these opioid pills in the first 24 hours after surgery if you have breakthrough pain. Do not take more than 1 pill every 4-6 hours.  If you still have uncontrolled pain after using all opioid pills, don't hesitate to call our staff using the number provided. We will help make sure you are managing your pain in the best way possible, and if necessary, we can provide a prescription for additional pain medication.   Day 1    Time  Name of Medication Number of pills taken  Amount of Acetaminophen  Pain Level   Comments  AM PM       AM PM       AM PM       AM PM       AM PM       AM PM       AM PM       AM PM       Total Daily amount of Acetaminophen Do not  take more than  3,000 mg per day      Day 2    Time  Name of Medication Number of pills taken  Amount of Acetaminophen  Pain Level   Comments  AM PM       AM PM       AM PM       AM PM       AM PM       AM PM       AM PM       AM PM       Total Daily amount of Acetaminophen Do not take more than  3,000 mg per day      Day 3    Time  Name of Medication Number of pills taken  Amount of Acetaminophen  Pain Level   Comments  AM PM       AM PM       AM PM       AM PM          AM PM       AM PM       AM PM       AM PM       Total Daily amount of Acetaminophen Do not take more than  3,000 mg per day      Day 4    Time  Name of Medication Number of pills taken  Amount of Acetaminophen  Pain Level   Comments  AM PM       AM PM       AM PM       AM PM       AM PM       AM PM       AM PM       AM PM       Total Daily amount of Acetaminophen Do not take more than  3,000 mg per day      Day 5    Time  Name of Medication Number of pills taken  Amount of Acetaminophen  Pain Level   Comments  AM PM       AM PM       AM   PM       AM PM       AM PM       AM PM       AM PM       AM PM       Total Daily amount of Acetaminophen Do not take more than  3,000 mg per day       Day 6    Time  Name of Medication Number of pills taken  Amount of Acetaminophen  Pain Level  Comments  AM PM       AM PM       AM PM       AM PM       AM PM       AM PM       AM PM       AM PM       Total Daily amount of Acetaminophen Do not take more than  3,000 mg per day      Day 7    Time  Name of Medication Number of pills taken  Amount of Acetaminophen  Pain Level   Comments  AM PM       AM PM       AM PM       AM PM       AM PM       AM PM       AM PM       AM PM       Total Daily amount of Acetaminophen Do not take more than  3,000 mg per day        For additional information about how and where to safely dispose of unused  opioid medications - https://www.morepowerfulnc.org  Disclaimer: This document contains information and/or instructional materials adapted from [facility] for the typical patient with your condition. It does not replace medical advice from your health care provider because your experience may differ from that of the typical patient. Talk to your health care provider if you have any questions about this document, your condition or your treatment plan. Adapted from [facility]   

## 2020-12-08 ENCOUNTER — Encounter (HOSPITAL_COMMUNITY): Payer: Self-pay | Admitting: General Surgery

## 2020-12-08 LAB — SURGICAL PATHOLOGY

## 2020-12-08 MED ORDER — OXYCODONE HCL 5 MG PO TABS: 5.0000 mg | ORAL_TABLET | Freq: Four times a day (QID) | ORAL | 0 refills | Status: DC | PRN

## 2020-12-08 NOTE — Progress Notes (Signed)
Denise Payne to be D/C'd  per MD order. Discussed with the patient and all questions fully answered.  VSS, Skin clean, dry and intact without evidence of skin break down, no evidence of skin tears noted.  IV catheter discontinued intact. Site without signs and symptoms of complications. Dressing and pressure applied.  An After Visit Summary was printed and given to the patient. Patient received prescription.  D/c education completed with patient/family including follow up instructions, medication list, d/c activities limitations if indicated, with other d/c instructions as indicated by MD - patient able to verbalize understanding, all questions fully answered.   Patient instructed to return to ED, call 911, or call MD for any changes in condition.   Patient to be escorted via [place], and D/C home via private auto.

## 2020-12-08 NOTE — Discharge Summary (Signed)
[facility] Discharge Summary   Patient ID: Denise Payne MRN: [id number] DOB/AGE: 76-Jan-1947 76 y.o.  Admit date: 12/06/2020 Discharge date: 12/08/2020  Discharge Diagnosis Patient Active Problem List   Diagnosis Date Noted  . Acute cholecystitis due to biliary calculus 12/06/2020  . Benign essential HTN 04/12/2020  . GERD (gastroesophageal reflux disease) 03/12/2020  . Osteopenia 03/12/2020  . Status post laparoscopic-assisted sigmoidectomy 05/22/2019  . 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 Asthma 01/26/2019  . Sjogren's syndrome ([place]) 01/26/2019  . Diverticulitis of large intestine with perforation 01/25/2019    Consultants None   Imaging: DG Cholangiogram Operative  Result Date: 12/07/2020 CLINICAL DATA:  75 year old female with cholelithiasis EXAM: INTRAOPERATIVE CHOLANGIOGRAM TECHNIQUE: Cholangiographic images from the C-arm fluoroscopic device were submitted for interpretation post-operatively. Please see the procedural report for the amount of contrast and the fluoroscopy time utilized. COMPARISON:  None. FINDINGS: Surgical instruments project over the upper abdomen. There is cannulation of the cystic duct/gallbladder neck, with antegrade infusion of contrast. Caliber of the extrahepatic ductal system within normal limits. No definite filling defect within the extrahepatic ducts identified. Free flow of contrast across the ampulla. IMPRESSION: Intraoperative cholangiogram demonstrates extrahepatic biliary ducts of unremarkable caliber, with no definite filling defects identified. Free flow of contrast across the ampulla. Please refer to the dictated operative report for full details of intraoperative findings and procedure Electronically Signed   By: Corrie Mckusick D.O.   On: 12/07/2020 11:15   CT Abdomen Pelvis W Contrast  Result Date: 12/06/2020 CLINICAL DATA:  Mid abdominal pain  since last night, history of diverticulitis. EXAM: CT ABDOMEN AND PELVIS WITH CONTRAST TECHNIQUE: Multidetector CT imaging of the abdomen and pelvis was performed using the standard protocol following bolus administration of intravenous contrast. CONTRAST:  12mL OMNIPAQUE IOHEXOL 300 MG/ML  SOLN COMPARISON:  CT abdomen pelvis Feb 14, 2019 FINDINGS: Lower chest: Partially visualized calcified right breast implant. Subsegmental atelectasis. No pericardial effusion. Hepatobiliary: No suspicious hepatic lesion. Cholelithiasis with a stone in the neck of the gallbladder distended gallbladder gallbladder wall thickening and pericholecystic fluid. There is prominence of the intrahepatic ducts however the common bile duct is within normal limits measuring 5 mm. Pancreas: No evidence of pancreatic inflammation. There is similar prominence of the pancreatic duct measuring up to 3 mm in the body. There is a 5 mm cystic lesion in the body of the pancreas (series 3, image 28). Spleen: Splenic granuloma. Adrenals/Urinary Tract: Adrenal glands are unremarkable. Kidneys are normal, without renal calculi, focal lesion, or hydronephrosis. Bladder is unremarkable. Stomach/Bowel: Stomach is unremarkable. No suspicious small bowel wall thickening or dilation. Appendix is visualized in the right lower quadrant with a 3 mm appendicoliths without evidence of adjacent inflammation. Terminal ileum appears unremarkable. Colonic diverticulosis without findings of acute diverticulitis. Anastomotic sutures in the left hemipelvis. Vascular/Lymphatic: Aortic atherosclerosis. No enlarged abdominal or pelvic lymph nodes. Reproductive: Status post hysterectomy. No adnexal masses. Other: No abdominopelvic ascites. Musculoskeletal: Diffuse demineralization of bone. Superior endplate compression deformity at T11 with approximately 35% height loss which is new in comparison to most recent prior CT abdomen pelvis. No significant change in appearance of  the superior endplate compression deformity at T10. IMPRESSION: 1. Acute calculus cholecystitis. 2. Colonic diverticulosis WITHOUT findings of acute diverticulitis. 3. Superior endplate compression deformity at T11 with approximately 35% height loss which is technically age indeterminate but appears chronic, recommend correlation with point tenderness. No significant change in appearance of the superior endplate compression deformity  at T10. 4. There is a 5 mm cystic lesion in the body of the pancreas with similar prominence of the pancreatic duct, favor side branch IPMN. Recommend follow-up pancreatic protocol MRI with and without contrast for further evaluation. This recommendation follows ACR consensus guidelines: Management of Incidental Pancreatic Cysts: A White Paper of the ACR Incidental Findings Committee. J Am Coll Radiol 7425;95:638-756. 5. Aortic atherosclerosis.  Aortic Atherosclerosis (ICD10-I70.0). These results were called by telephone at the time of interpretation on 12/06/2020 at 4:52 pm to provider MARGAUX VENTER , who verbally acknowledged these results. Electronically Signed   By: Dahlia Bailiff MD   On: 12/06/2020 16:53   US Abdomen Limited RUQ (LIVER/GB)  Result Date: 12/06/2020 CLINICAL DATA:  Concern for acute cholecystitis. EXAM: ULTRASOUND ABDOMEN LIMITED RIGHT UPPER QUADRANT COMPARISON:  Same day CT abdomen pelvis. FINDINGS: Gallbladder: Gallbladder is enlarged with a thickened wall measuring 5 mm and pericholecystic fluid. There are cholelithiasis measuring up to 1.4 cm with biliary sludge. Positive sonographic Murphy sign noted by sonographer. Common bile duct: Diameter: 9 mm Liver: No focal lesion identified. Within normal limits in parenchymal echogenicity. Portal vein is patent on color Doppler imaging with normal direction of blood flow towards the liver. Other: None. IMPRESSION: 1. Sonographic findings consistent with acute calculus cholecystitis. 2. Dilated common bile duct  measuring 9 mm, without visualized filling defect. If clinical concern for choledocholithiasis recommend ERCP/MRCP. Electronically Signed   By: Dahlia Bailiff MD   On: 12/06/2020 18:03    Procedures Dr. Gurney Maxin - Laparoscopic Cholecystectomy with IOC   HPI: Very pleasant 75yo woman with history of hypertension, GERD, Sjogren's syndrome who presented to the emergency department today with gradually worsening abdominal pain beginning around 9 PM yesterday.  This is in the central upper abdomen initially but is now more in the right upper quadrant.  She stated that initially it felt like hunger pains, sharp and constant in quality.  It has not necessarily worsened today but it has not abated at all.  This is associated with nausea, endorses 1 attempted self-induced emesis without relief of symptoms, no other emesis.  No known fever.  Last normal bowel movement was yesterday and she does endorse flatus today.  She does have a known history of gallstones based on previous imaging studies.  She has not had any prior symptoms of biliary colic.   Previous abdominal surgery includes hysterectomy, and LAR for sigmoid diverticulitis by Dr. Dema Severin in August 2020.  Hospital Course:  Workup showed acute calculous cholecystitis by CT and ultrasound, LFT's were normal.  Patient was admitted and underwent procedure listed above.  Tolerated procedure well and was transferred to the floor.  Diet was advanced as tolerated.  On POD#1, the patient was voiding well, tolerating diet, ambulating well, pain well controlled, vital signs stable, incisions c/d/i and felt stable for discharge home.  Patient will follow up in our office as below and knows to call with questions or concerns.   Physical Exam: General:  Alert, NAD, pleasant, comfortable Abd:  Soft, ND, mild tenderness, incisions C/D/I  Allergies as of 12/08/2020      Reactions   Latex Shortness Of Breath, Swelling   Lip and eye swelling   Morphine And  Related Other (See Comments)   Makes her cramp in abdomen   Onion Diarrhea, Other (See Comments)   Stomach cramps      Medication List    TAKE these medications   acetaminophen 500 MG tablet Commonly known as: TYLENOL  Take 1,000 mg by mouth every 6 (six) hours as needed for mild pain.   albuterol 108 (90 Base) MCG/ACT inhaler Commonly known as: VENTOLIN HFA Inhale 2 puffs into the lungs every 6 (six) hours as needed for wheezing or shortness of breath.   aspirin EC 81 MG tablet Take 81 mg by mouth every evening.   BIOTIN PO Take 1 tablet by mouth daily.   cyclobenzaprine 10 MG tablet Commonly known as: FLEXERIL Take 1 tablet (10 mg total) by mouth 3 (three) times daily as needed for muscle spasms.   diclofenac sodium 1 % Gel Commonly known as: VOLTAREN Apply 2 g topically 2 (two) times daily as needed (pain).   esomeprazole 40 MG capsule Commonly known as: NEXIUM TAKE 1 CAPSULE DAILY   hydroxychloroquine 200 MG tablet Commonly known as: PLAQUENIL Take 200 mg by mouth 2 (two) times daily.   lisinopril 40 MG tablet Commonly known as: ZESTRIL Take 1 tablet (40 mg total) by mouth daily.   Melatonin 10 MG Tabs Take 10 mg by mouth at bedtime as needed (sleep).   montelukast 10 MG tablet Commonly known as: SINGULAIR Take 1 tablet (10 mg total) by mouth at bedtime.   naproxen sodium 220 MG tablet Commonly known as: ALEVE Take 440 mg by mouth daily as needed (pain).   oxyCODONE 5 MG immediate release tablet Commonly known as: Oxy IR/ROXICODONE Take 1 tablet (5 mg total) by mouth every 6 (six) hours as needed for moderate pain (post-operative pain not relieved by tylenol and ibuprofen).   PROBIOTIC PO Take 1 tablet by mouth daily.   vitamin B-12 1000 MCG tablet Commonly known as: CYANOCOBALAMIN Take 1,000 mcg by mouth daily.   Vitamin D 50 MCG (2000 UT) tablet Take 2,000 Units by mouth daily.        Follow-up Information    [facility], [facility]  Follow up on 12/28/2020.   Specialty: General Surgery Why: 9:45am, please arrive 30 minutes prior to your appointment time for paperwork and check in process. Contact information: [place] [address] [phone]               Signed: Obie Dredge, [facility] 12/08/2020, 9:56 AM

## 2020-12-09 ENCOUNTER — Telehealth: Payer: Self-pay

## 2020-12-09 NOTE — Telephone Encounter (Signed)
Transition Care Management Unsuccessful Follow-up Telephone Call  Date of discharge and from where:  [facility] 12/08/20  Attempts:  1st Attempt  Reason for unsuccessful TCM follow-up call:  Left voice message

## 2020-12-10 ENCOUNTER — Telehealth: Payer: Self-pay

## 2020-12-10 NOTE — Telephone Encounter (Signed)
Transition Care Management Follow-up Telephone Call  Date of discharge and from where: [facility] 12/08/20  How have you been since you were released from the hospital? Doing well   Any questions or concerns? No  Items Reviewed:  Did the pt receive and understand the discharge instructions provided? Yes   Medications obtained and verified? Yes   Other? No  Any new allergies since your discharge? No   Dietary orders reviewed? Yes  Do you have support at home? Yes   Home Care and Equipment/Supplies: Were home health services ordered? not applicable If so, what is the name of the agency?   Has the agency set up a time to come to the patient's home? not applicable Were any new equipment or medical supplies ordered?  No What is the name of the medical supply agency?  Were you able to get the supplies/equipment? not applicable Do you have any questions related to the use of the equipment or supplies? No  Functional Questionnaire: (I = Independent and D = Dependent) ADLs: I   Bathing/Dressing- I  Meal Prep- I  Eating- I  Maintaining continence- I  Transferring/Ambulation- I  Managing Meds- I  Follow up appointments reviewed:   PCP Hospital f/u appt confirmed? Yes  Scheduled to see Dr Rogers Blocker  on 12/15/20 @ 10:00 a.m   Specialist Hospital f/u appt confirmed? Yes  Scheduled to see General Surgery on 12/28/20 @ 9:45 a.m.   Are transportation arrangements needed? No   If their condition worsens, is the pt aware to call PCP or go to the Emergency Dept.? Yes  Was the patient provided with contact information for the PCP's office or ED? Yes  Was to pt encouraged to call back with questions or concerns? Yes

## 2020-12-10 NOTE — Telephone Encounter (Signed)
This is a surgical follow up. Typically do not need to see them unless they want to be seen. They will just f/u with surgery.  Thanks,  Orma Flaming, MD [place]

## 2020-12-11 LAB — CULTURE, BLOOD (ROUTINE X 2)
Culture: NO GROWTH
Culture: NO GROWTH
Special Requests: ADEQUATE

## 2020-12-15 ENCOUNTER — Encounter: Payer: Self-pay | Admitting: Family Medicine

## 2020-12-15 ENCOUNTER — Ambulatory Visit (INDEPENDENT_AMBULATORY_CARE_PROVIDER_SITE_OTHER): Payer: Medicare Other | Admitting: Family Medicine

## 2020-12-15 ENCOUNTER — Other Ambulatory Visit: Payer: Self-pay

## 2020-12-15 VITALS — BP 138/60 | HR 81 | Temp 98.2°F | Ht 63.0 in | Wt 166.2 lb

## 2020-12-15 DIAGNOSIS — Z1159 Encounter for screening for other viral diseases: Secondary | ICD-10-CM | POA: Diagnosis not present

## 2020-12-15 DIAGNOSIS — I10 Essential (primary) hypertension: Secondary | ICD-10-CM | POA: Diagnosis not present

## 2020-12-15 DIAGNOSIS — K862 Cyst of pancreas: Secondary | ICD-10-CM | POA: Diagnosis not present

## 2020-12-15 DIAGNOSIS — K8000 Calculus of gallbladder with acute cholecystitis without obstruction: Secondary | ICD-10-CM | POA: Diagnosis not present

## 2020-12-15 DIAGNOSIS — I700 Atherosclerosis of aorta: Secondary | ICD-10-CM

## 2020-12-15 DIAGNOSIS — M720 Palmar fascial fibromatosis [Dupuytren]: Secondary | ICD-10-CM | POA: Diagnosis not present

## 2020-12-15 LAB — CBC WITH DIFFERENTIAL/PLATELET
Basophils Absolute: 0.1 10*3/uL (ref 0.0–0.1)
Basophils Relative: 1.1 % (ref 0.0–3.0)
Eosinophils Absolute: 0.2 10*3/uL (ref 0.0–0.7)
Eosinophils Relative: 2.9 % (ref 0.0–5.0)
HCT: 33.6 % — ABNORMAL LOW (ref 36.0–46.0)
Hemoglobin: 11.3 g/dL — ABNORMAL LOW (ref 12.0–15.0)
Lymphocytes Relative: 15.7 % (ref 12.0–46.0)
Lymphs Abs: 1.3 10*3/uL (ref 0.7–4.0)
MCHC: 33.5 g/dL (ref 30.0–36.0)
MCV: 86 fl (ref 78.0–100.0)
Monocytes Absolute: 0.6 10*3/uL (ref 0.1–1.0)
Monocytes Relative: 8.2 % (ref 3.0–12.0)
Neutro Abs: 5.7 10*3/uL (ref 1.4–7.7)
Neutrophils Relative %: 72.1 % (ref 43.0–77.0)
Platelets: 385 10*3/uL (ref 150.0–400.0)
RBC: 3.9 Mil/uL (ref 3.87–5.11)
RDW: 13.4 % (ref 11.5–15.5)
WBC: 8 10*3/uL (ref 4.0–10.5)

## 2020-12-15 LAB — LIPID PANEL
Cholesterol: 178 mg/dL (ref 0–200)
HDL: 52.8 mg/dL (ref >39.00)
LDL Cholesterol: 108 mg/dL — ABNORMAL HIGH (ref 0–99)
NonHDL: 125.44
Total CHOL/HDL Ratio: 3
Triglycerides: 89 mg/dL (ref 0.0–149.0)
VLDL: 17.8 mg/dL (ref 0.0–40.0)

## 2020-12-15 MED ORDER — OXYCODONE HCL 5 MG PO TABS: 5.0000 mg | ORAL_TABLET | Freq: Four times a day (QID) | ORAL | 0 refills | Status: DC | PRN

## 2020-12-15 NOTE — Patient Instructions (Addendum)
-  ordering MRI of pancreas and referral to GI.  -lipid panel today and will calculate risk, but per guidelines if atherosclerosis on aorta recommend statin medication. My favorite is crestor.  Fabienne Bruns referal and referral to ortho.   Glad you are doing better enjoy time with your daughter!  Aw

## 2020-12-15 NOTE — Progress Notes (Signed)
Patient: Denise Payne MRN: [id number] DOB: [birth year] PCP: Orma Flaming, MD     Subjective:  Chief Complaint  Patient presents with  . Hospitalization Follow-up  . pancreatic cyst  . aortic atherosclerosis  . Hand Pain    HPI: The patient is a 75 y.o. female who presents today for hospital follow up as well as some incidental findings on CT while in hospital and some hand issues.  She recently had cholecystitis s/p cholecystecomy. She says that she is feeling pretty good.  She has f/u with surgery next week. While in ER, CT abdomen was done for stomach pain and besides gallstones other findings were found.   Admit date: 12/06/20 Discharge date: 12/08/2020  1) acute cholecystitis due to biliary calculus -workup showed acute calculous cholecystitis by CT and ultrasound, LFTs normal  -underwent laparoscopic cholecystectomy on 12/07/20 by Dr Gurney Maxin. Tolerated well. Diet advanced and she was discharged home POD #1 -she has been doing well at home. Diet advanced, voiding normal and normal BM. Incisions are clean and dry. No abdominal pain/fever/chills.   2) CT showed incidental finding of aortic atherosclerosis Would like to discuss this in detail.   3) Ct also showed a pancreatic cyst (incidental finding)  -57mm in body of pancreas -lipase normal -recommended pancreatic protocol MRI with and without contrast for further eval.  -she has no pain or symptoms.   4) hand pain She has bilateral thickened areas on palm of her hands. Prevents her from fully extended fingers.    Review of Systems  Constitutional: Negative for chills, fatigue and fever.  HENT: Negative for dental problem, ear pain, hearing loss and trouble swallowing.   Eyes: Negative for visual disturbance.  Respiratory: Negative for cough, chest tightness and shortness of breath.   Cardiovascular: Negative for chest pain, palpitations and leg swelling.  Gastrointestinal: Negative for abdominal pain, blood in  stool, diarrhea and nausea.  Endocrine: Negative for cold intolerance, polydipsia, polyphagia and polyuria.  Genitourinary: Negative for dysuria and hematuria.  Musculoskeletal: Negative for arthralgias.       Bilateral hand pain   Skin: Negative for rash.  Neurological: Negative for dizziness and headaches.  Psychiatric/Behavioral: Negative for dysphoric mood and sleep disturbance. The patient is not nervous/anxious.     Allergies Patient is allergic to latex, morphine and related, and onion.  Past Medical History Patient  has a past medical history of Anemia, Asthma, Cataract, GERD (gastroesophageal reflux disease), Osteoporosis, and Sjogren's syndrome ([place]) (01/26/2019).  Surgical History Patient  has a past surgical history that includes Augmentation mammaplasty (Bilateral, 10/16/1976); Flexible sigmoidoscopy (N/A, 05/22/2019); XI robotic assisted lower anterior resection (Bilateral, 05/22/2019); Cystoscopy with stent placement (N/A, 05/22/2019); Ankle fracture surgery (2005); Abdominal hysterectomy (1996); and Cholecystectomy (N/A, 12/07/2020).  Family History Pateint's family history includes Arthritis in her maternal grandmother and paternal grandmother; Birth defects in her brother; Breast cancer (age of onset: 31) in her mother; Cancer in her brother, father, maternal grandmother, and sister; Diabetes in her maternal grandfather; Early death in her maternal grandfather, maternal grandmother, paternal grandfather, and paternal grandmother; Hearing loss in her father, maternal grandfather, mother, and paternal grandmother; Heart attack in her maternal grandfather; Heart disease in her maternal grandfather and mother; High Cholesterol in her mother; Hyperlipidemia in her brother, paternal grandmother, and sister; Hypertension in her mother and sister; Kidney disease in her paternal grandfather; Stroke in her father; Transient ischemic attack in her father.  Social History Patient  reports that  she has never smoked. She has never  used smokeless tobacco. She reports current alcohol use of about 8.0 standard drinks of alcohol per week. She reports that she does not use drugs.    Objective: Vitals:   12/15/20 1006  BP: 138/60  Pulse: 81  Temp: 98.2 F (36.8 C)  TempSrc: Temporal  SpO2: 97%  Weight: 166 lb 3.2 oz (75.4 kg)  Height: 5\' 3"  (1.6 m)    Body mass index is 29.44 kg/m.  Physical Exam Vitals reviewed.  Constitutional:      Appearance: Normal appearance. She is normal weight.  HENT:     Head: Normocephalic and atraumatic.  Cardiovascular:     Rate and Rhythm: Normal rate and regular rhythm.     Heart sounds: Normal heart sounds.  Pulmonary:     Effort: Pulmonary effort is normal.     Breath sounds: Normal breath sounds.  Abdominal:     General: Bowel sounds are normal. There is no distension.     Palpations: Abdomen is soft.     Tenderness: There is abdominal tenderness.     Comments: Mild ttp over right side with gentle palpation s/p surgery. Incisions are well healed. No drainage. Minor bruising   Musculoskeletal:     Comments: Fibrous areas on palm of hand.   Skin:    Capillary Refill: Capillary refill takes less than 2 seconds.  Neurological:     General: No focal deficit present.     Mental Status: She is alert and oriented to person, place, and time.  Psychiatric:        Mood and Affect: Mood normal.        Behavior: Behavior normal.        Assessment/plan: 1. Acute calculous cholecystitis Doing well post op. Continue per surgeons orders.   2. Atherosclerosis of aorta ([place]) Discussed I recommend statin therapy for this. Has not had lipid panel checked since I have known her so we will check today and calculate ascvd risk, regardless of this, I recommend statin therapy. Discussed this with her and advised she look into this.  - Lipid panel  3. Dupuytren's contracture  - Ambulatory referral to Orthopedic Surgery  4. Cyst of pancreas MRI  and referral to GI. Asymptomatic.  - MR Abdomen W Wo Contrast; Future  5. Benign essential HTN To goal. Continue current medication. - CBC with Differential/Platelet  6. Encounter for hepatitis C screening test for low risk patient  - Hepatitis C antibody   This visit occurred during the SARS-CoV-2 public health emergency.  Safety protocols were in place, including screening questions prior to the visit, additional usage of staff PPE, and extensive cleaning of exam room while observing appropriate contact time as indicated for disinfecting solutions.     Return in about 6 months (around 06/17/2021) for routine f/u .    Orma Flaming, MD [place]   12/15/2020

## 2020-12-16 LAB — HEPATITIS C ANTIBODY
Hepatitis C Ab: NONREACTIVE
SIGNAL TO CUT-OFF: 0 (ref <1.00)

## 2020-12-28 ENCOUNTER — Encounter: Payer: Self-pay | Admitting: Family Medicine

## 2020-12-28 DIAGNOSIS — I700 Atherosclerosis of aorta: Secondary | ICD-10-CM

## 2020-12-29 MED ORDER — ROSUVASTATIN CALCIUM 10 MG PO TABS: 10.0000 mg | ORAL_TABLET | Freq: Every day | ORAL | 3 refills | Status: DC

## 2020-12-30 DIAGNOSIS — M65332 Trigger finger, left middle finger: Secondary | ICD-10-CM | POA: Diagnosis not present

## 2020-12-30 DIAGNOSIS — M1812 Unilateral primary osteoarthritis of first carpometacarpal joint, left hand: Secondary | ICD-10-CM | POA: Diagnosis not present

## 2020-12-30 DIAGNOSIS — M65312 Trigger thumb, left thumb: Secondary | ICD-10-CM | POA: Diagnosis not present

## 2020-12-30 DIAGNOSIS — M720 Palmar fascial fibromatosis [Dupuytren]: Secondary | ICD-10-CM | POA: Diagnosis not present

## 2021-01-05 ENCOUNTER — Other Ambulatory Visit: Payer: Self-pay

## 2021-01-05 ENCOUNTER — Ambulatory Visit
Admission: RE | Admit: 2021-01-05 | Discharge: 2021-01-05 | Disposition: A | Discharge status: Home / Self Care | Payer: Medicare Other | Source: Ambulatory Visit | Attending: Family Medicine | Admitting: Family Medicine

## 2021-01-05 DIAGNOSIS — M5386 Other specified dorsopathies, lumbar region: Secondary | ICD-10-CM | POA: Diagnosis not present

## 2021-01-05 DIAGNOSIS — Z9049 Acquired absence of other specified parts of digestive tract: Secondary | ICD-10-CM | POA: Diagnosis not present

## 2021-01-05 DIAGNOSIS — K862 Cyst of pancreas: Secondary | ICD-10-CM

## 2021-01-05 IMAGING — MR MR ABDOMEN WO/W CM
11 of 18 series · 26 of 48 positions shown · IV contrast (14ml Multihance)
Comparison: CT [DATE]

CLINICAL DATA: Evaluate pancreas cyst.

EXAM:
MRI ABDOMEN WITHOUT AND WITH CONTRAST
TECHNIQUE: Multiplanar multisequence MR imaging of the abdomen was performed
both before and after the administration of intravenous contrast.
CONTRAST:  14mL MULTIHANCE GADOBENATE DIMEGLUMINE 529 MG/ML IV SOLN

[Series 3: cor haste · coronal · 5.0mm · 0.70mm/px · 2 of 32 slices shown]
[im 1/32]
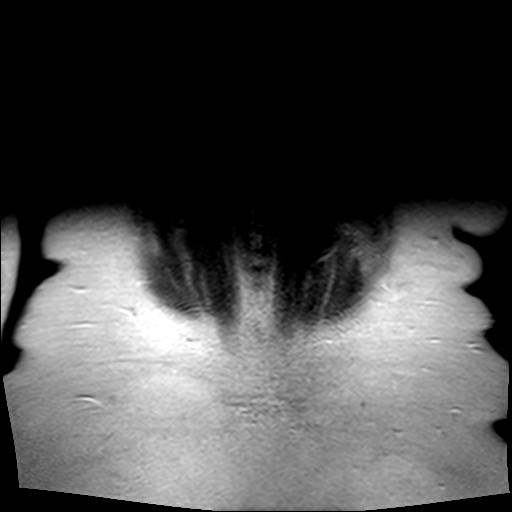
[im 32/32]
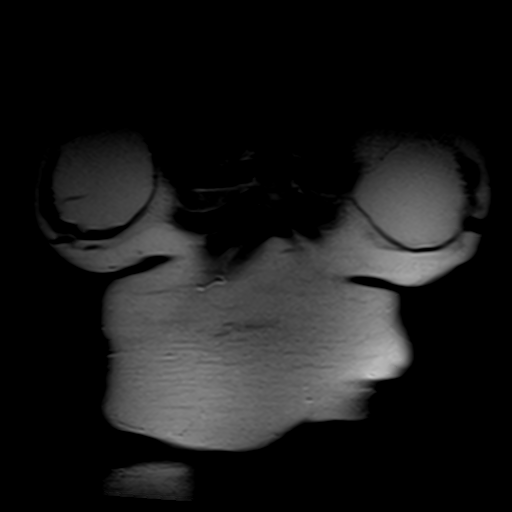

[Series 4: axial haste · axial · 6.0mm · 0.70mm/px · z∈[-85,+127]mm · 2 of 33 slices shown]
[im 1/33]
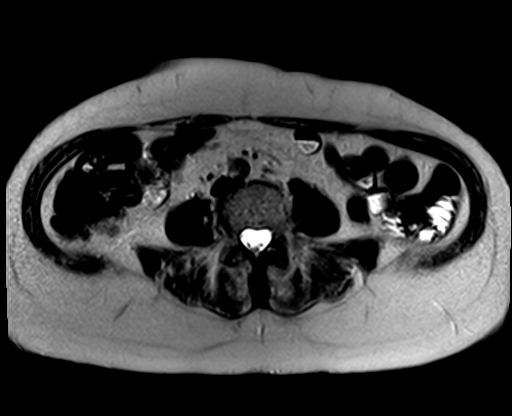
[im 33/33]
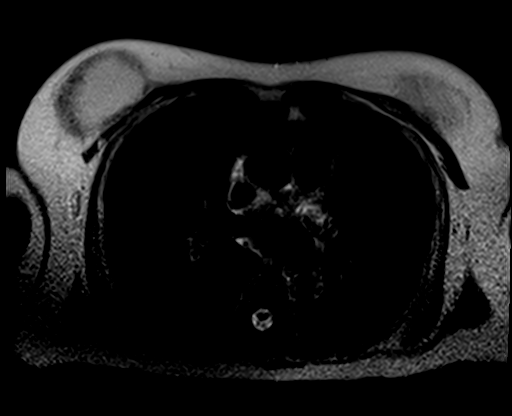

[Series 5: T1 · axial · 6.0mm · 0.70mm/px · z∈[-85,+127]mm · 3 of 66 slices shown]
[im 1/66]
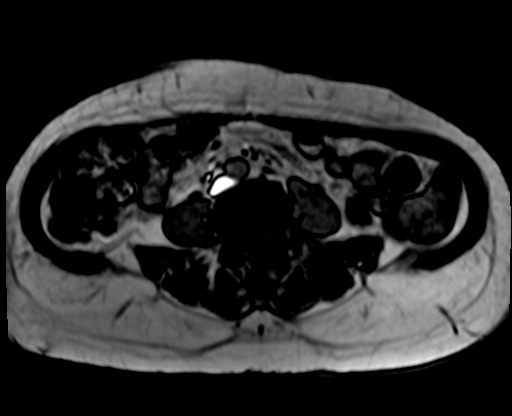
[im 33/66]
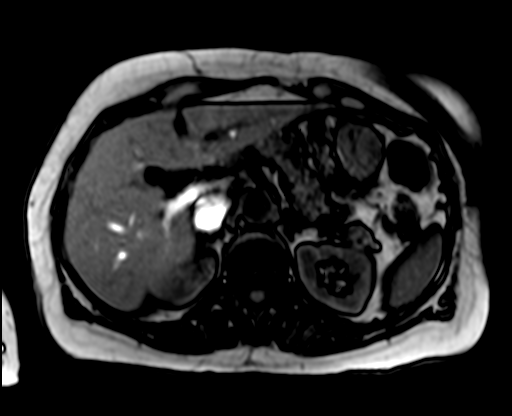
[im 66/66]
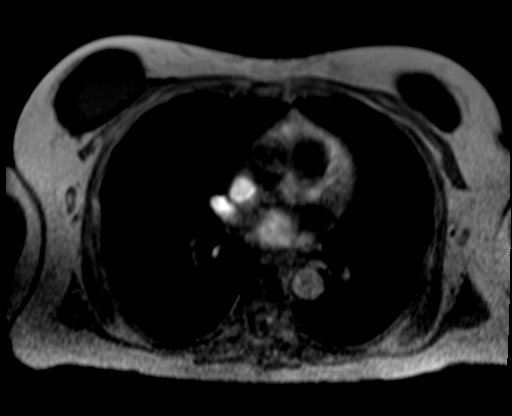

[Series 10: T2 · axial · 6.0mm · 1.12mm/px · 1 of 36 slices shown (1 of 2)]
[im 1/36]
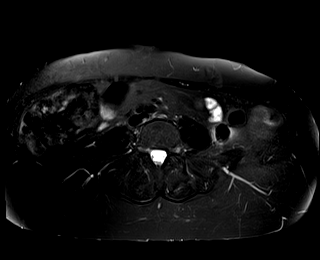

[Series 11: ep2d_diff_b50_500_800_p2_trig · axial · 6.0mm · 1.88mm/px · z∈[-95,+136]mm · 4 of 107 slices shown]
[im 1/107]
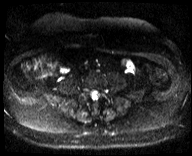
[im 36/107]
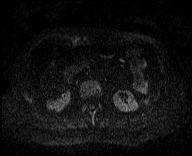
[im 71/107]
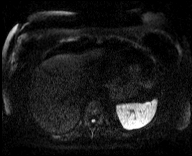
[im 107/107]
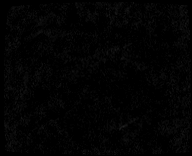

[Series 12: [id number]_500_[id number]_trig_adc · axial · 6.0mm · 1.88mm/px · 1 of 36 slices shown]
[im 1/36]
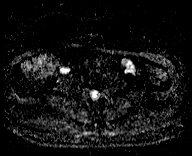

[Series 13: T2 · coronal · 3.0mm · 0.70mm/px · 2 of 50 slices shown (2 of 2)]
[im 1/50]
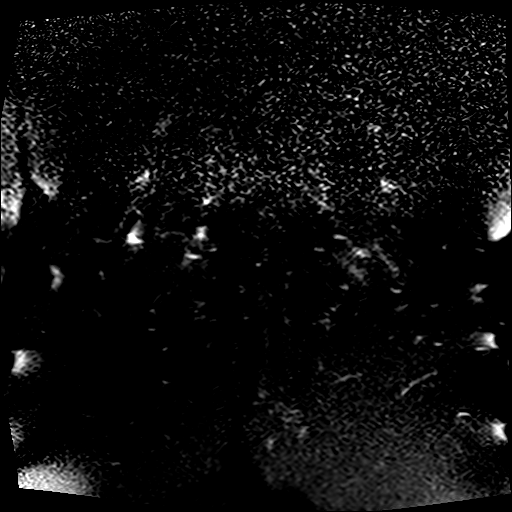
[im 50/50]
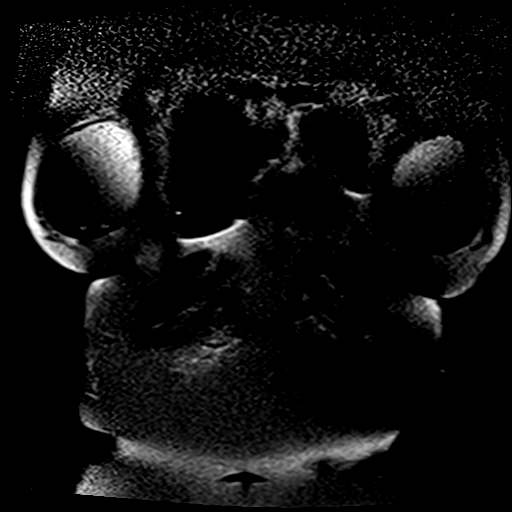

[Series 14: T1 dynamic · axial · non-contrast · 2.5mm · 0.70mm/px · z∈[-87,+130]mm · 3 of 88 slices shown]
[im 1/88]
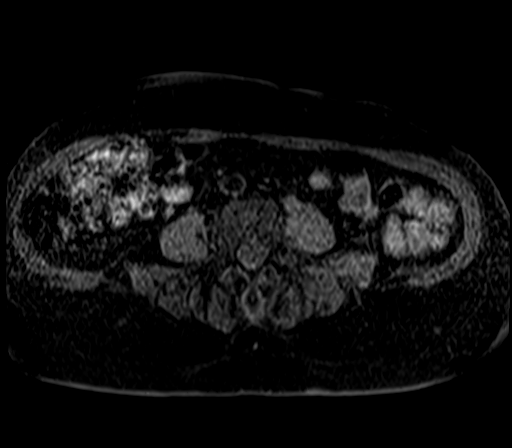
[im 44/88]
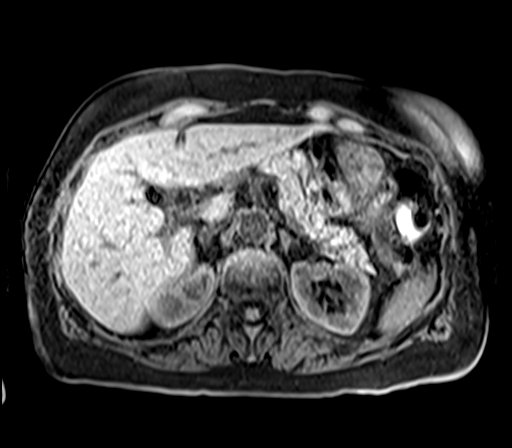
[im 88/88]
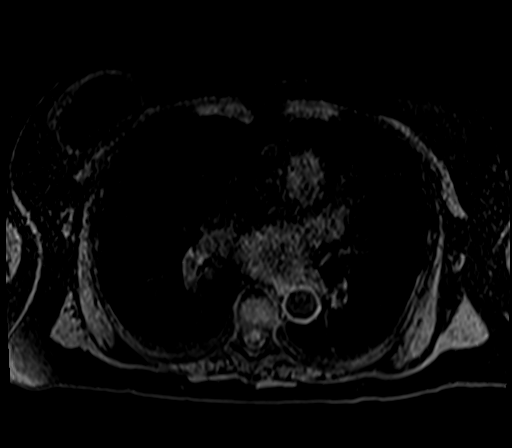

[Series 15: T1 dynamic post-contrast · axial · 2.5mm · 0.70mm/px · z∈[-87,+130]mm · 3 of 88 slices shown (1 of 3)]
[im 1/88]
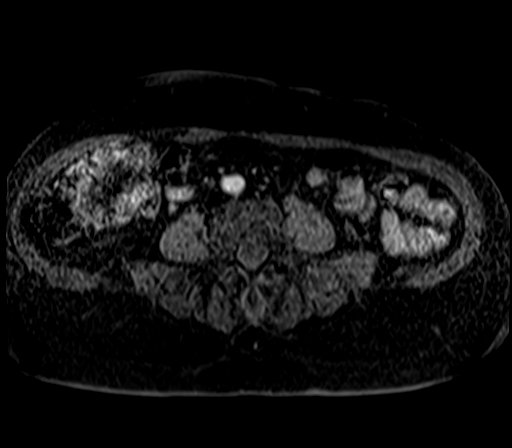
[im 44/88]
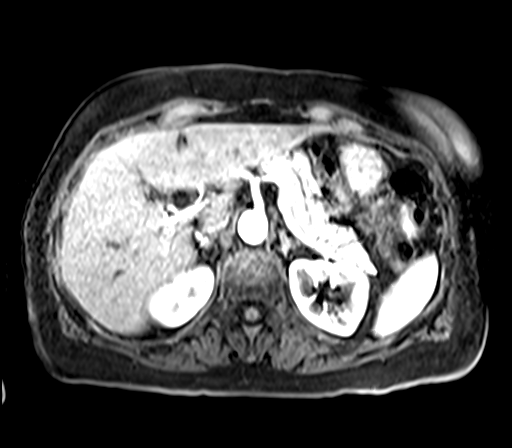
[im 88/88]
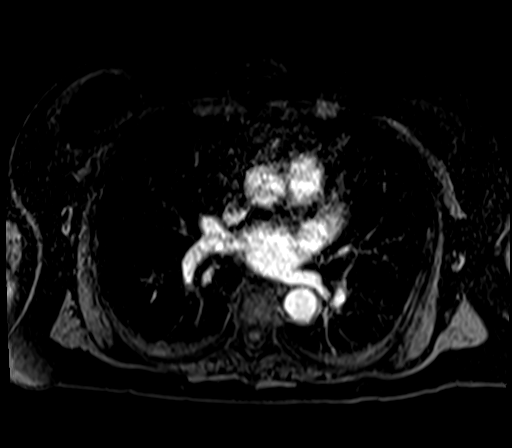

[Series 16: T1 dynamic post-contrast · axial · 2.5mm · 0.70mm/px · z∈[-87,+130]mm · 3 of 88 slices shown (2 of 3)]
[im 1/88]
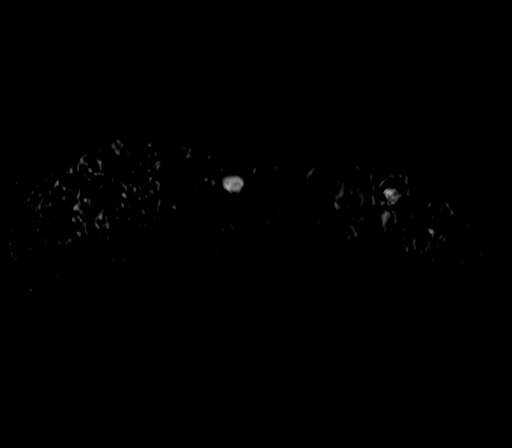
[im 44/88]
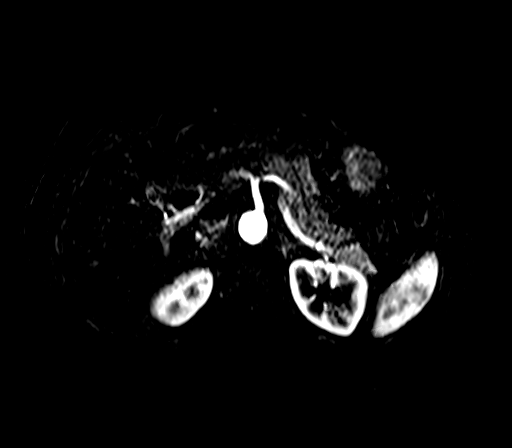
[im 88/88]
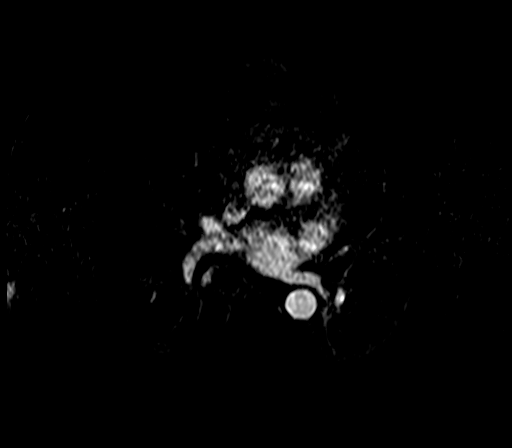

[Series 17: T1 dynamic post-contrast · axial · 2.5mm · 0.70mm/px · z∈[-87,+20]mm · 2 of 88 slices shown (3 of 3)]
[im 1/88]
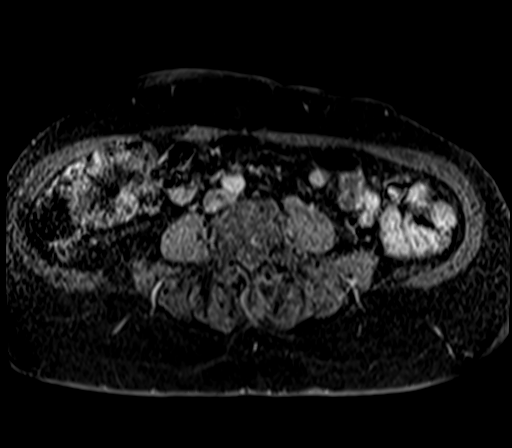
[im 44/88]
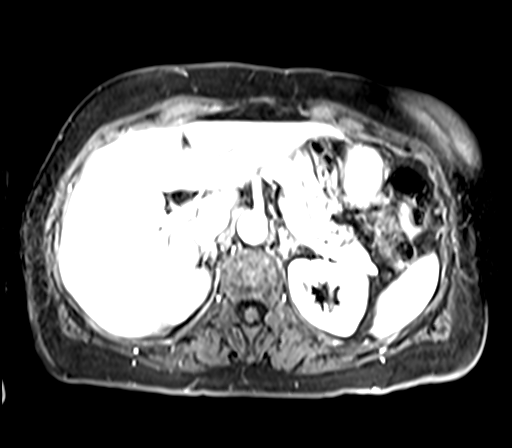

[26 of 48 positions shown; findings below may reference images not displayed]

FINDINGS: Lower chest: No acute findings.

Hepatobiliary: No suspicious enhancing liver abnormality. Previous
cholecystectomy. Increase caliber of the CBD measures up to 1.4 cm,
image 29/13. No focal filling defects identified within the CBD to
suggest choledocholithiasis.

Pancreas: No signs of pancreatic inflammation. Upper limits of
normal in caliber main pancreatic duct measures 3 mm in diameter,
image [DATE]. Previously noted cystic lesion within body of pancreas
is not visualized on today's study. No pancreatic mass identified.

Spleen:  Within normal limits in size and appearance.

Adrenals/Urinary Tract: Normal appearance of the adrenal glands. No
kidney mass or hydronephrosis identified.

Stomach/Bowel: Visualized portions within the abdomen are
unremarkable.

Vascular/Lymphatic: Normal appearance of the abdominal aorta. No
abdominopelvic adenopathy.

Other:  No free fluid or fluid collections.

Musculoskeletal: No suspicious bone lesions. Compression deformity
involving the T10 and T11 vertebra are again noted and appear
unchanged. Mild curvature of the lumbar spine appears convex towards
the right.
IMPRESSION: 1. Previously noted cystic lesion within body of pancreas is not
visualized on today's study. No signs of pancreatic inflammation or
mass.
2. Chronic, increase caliber of the CBD status post cholecystectomy.
No choledocholithiasis or obstructing mass identified.

## 2021-01-05 MED ORDER — GADOBENATE DIMEGLUMINE 529 MG/ML IV SOLN
14.0000 mL | Freq: Once | INTRAVENOUS | Status: AC | PRN
  Administered 2021-01-05: 14 mL via INTRAVENOUS

## 2021-01-19 DIAGNOSIS — Z79899 Other long term (current) drug therapy: Secondary | ICD-10-CM | POA: Diagnosis not present

## 2021-01-19 DIAGNOSIS — H26492 Other secondary cataract, left eye: Secondary | ICD-10-CM | POA: Diagnosis not present

## 2021-01-19 DIAGNOSIS — M3501 Sicca syndrome with keratoconjunctivitis: Secondary | ICD-10-CM | POA: Diagnosis not present

## 2021-01-19 DIAGNOSIS — H524 Presbyopia: Secondary | ICD-10-CM | POA: Diagnosis not present

## 2021-01-19 DIAGNOSIS — Z961 Presence of intraocular lens: Secondary | ICD-10-CM | POA: Diagnosis not present

## 2021-01-29 ENCOUNTER — Encounter (HOSPITAL_BASED_OUTPATIENT_CLINIC_OR_DEPARTMENT_OTHER): Payer: Self-pay | Admitting: *Deleted

## 2021-01-29 ENCOUNTER — Other Ambulatory Visit: Payer: Self-pay

## 2021-01-29 ENCOUNTER — Emergency Department (HOSPITAL_BASED_OUTPATIENT_CLINIC_OR_DEPARTMENT_OTHER): Payer: Medicare Other

## 2021-01-29 ENCOUNTER — Emergency Department (HOSPITAL_BASED_OUTPATIENT_CLINIC_OR_DEPARTMENT_OTHER)
Admission: EM | Admit: 2021-01-29 | Discharge: 2021-01-29 | Disposition: A | Discharge status: Home / Self Care | Payer: Medicare Other | Attending: Emergency Medicine | Admitting: Emergency Medicine

## 2021-01-29 DIAGNOSIS — S61011A Laceration without foreign body of right thumb without damage to nail, initial encounter: Secondary | ICD-10-CM | POA: Diagnosis not present

## 2021-01-29 DIAGNOSIS — S6991XA Unspecified injury of right wrist, hand and finger(s), initial encounter: Secondary | ICD-10-CM | POA: Diagnosis present

## 2021-01-29 DIAGNOSIS — Z79899 Other long term (current) drug therapy: Secondary | ICD-10-CM | POA: Insufficient documentation

## 2021-01-29 DIAGNOSIS — W268XXA Contact with other sharp object(s), not elsewhere classified, initial encounter: Secondary | ICD-10-CM | POA: Insufficient documentation

## 2021-01-29 DIAGNOSIS — I10 Essential (primary) hypertension: Secondary | ICD-10-CM | POA: Diagnosis not present

## 2021-01-29 DIAGNOSIS — Z91040 Latex allergy status: Secondary | ICD-10-CM | POA: Insufficient documentation

## 2021-01-29 DIAGNOSIS — J45909 Unspecified asthma, uncomplicated: Secondary | ICD-10-CM | POA: Insufficient documentation

## 2021-01-29 DIAGNOSIS — Z7982 Long term (current) use of aspirin: Secondary | ICD-10-CM | POA: Diagnosis not present

## 2021-01-29 DIAGNOSIS — S61411A Laceration without foreign body of right hand, initial encounter: Secondary | ICD-10-CM | POA: Insufficient documentation

## 2021-01-29 DIAGNOSIS — Y93G3 Activity, cooking and baking: Secondary | ICD-10-CM | POA: Diagnosis not present

## 2021-01-29 IMAGING — DX DG HAND COMPLETE 3+V*R*
1 series · 3 of 3 positions shown · non-contrast
Comparison: None.

CLINICAL DATA: Laceration at base of thumb

EXAM:
RIGHT HAND - COMPLETE 3+ VIEW

[Series 1: hand · 0.14mm/px · 3 of 3 slices shown]
[im 1/3]
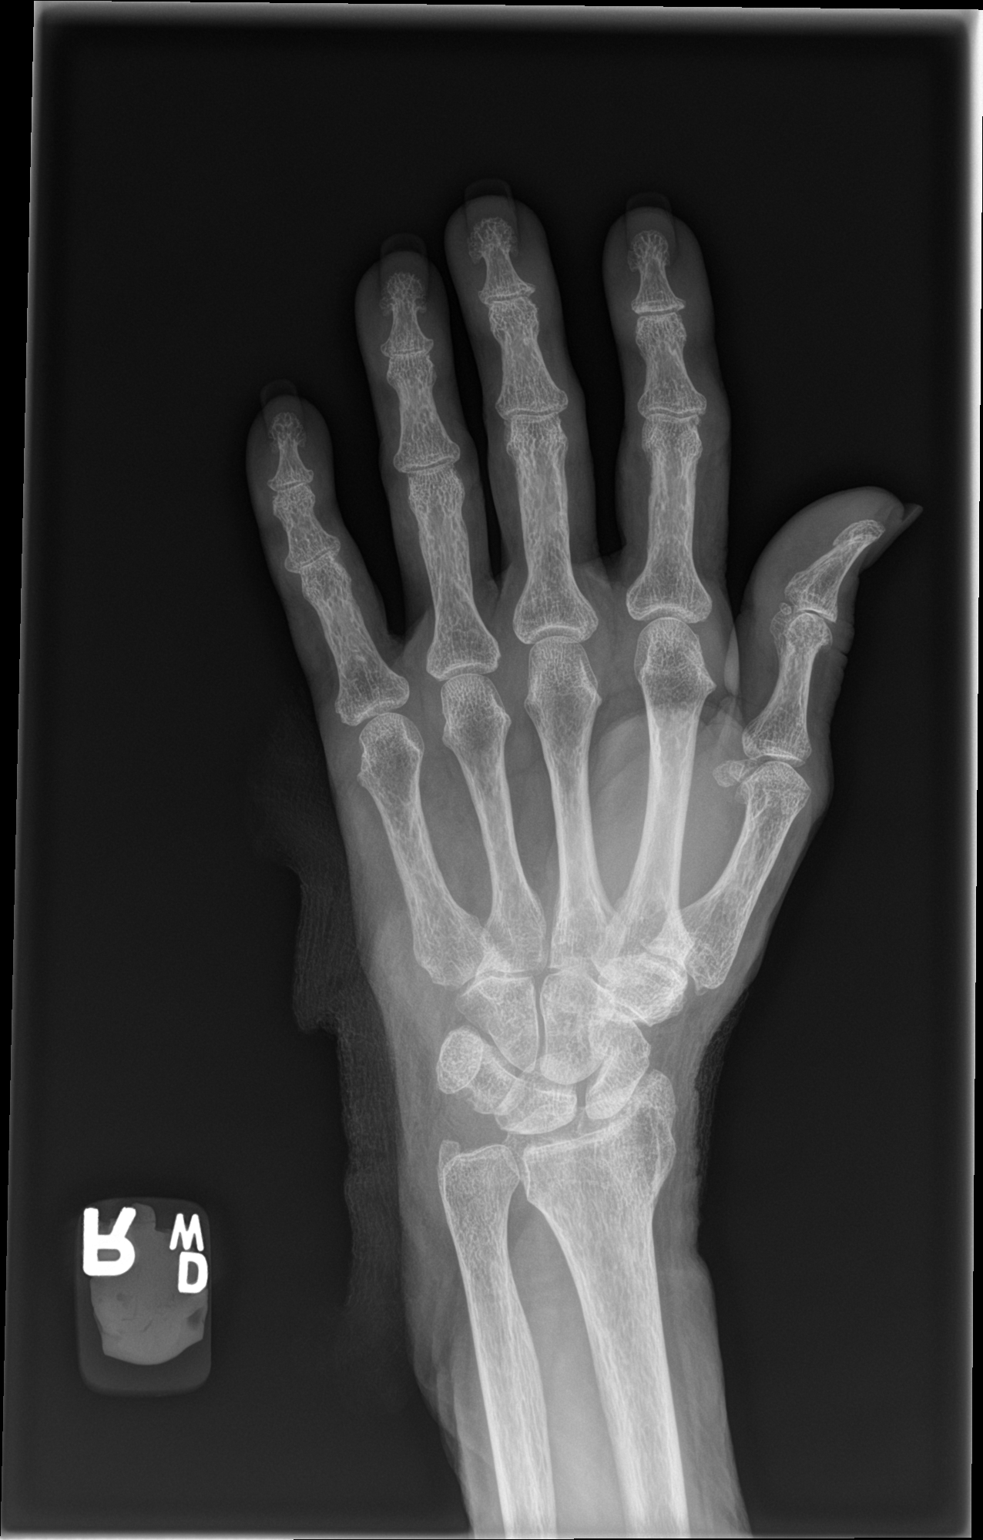
[im 2/3]
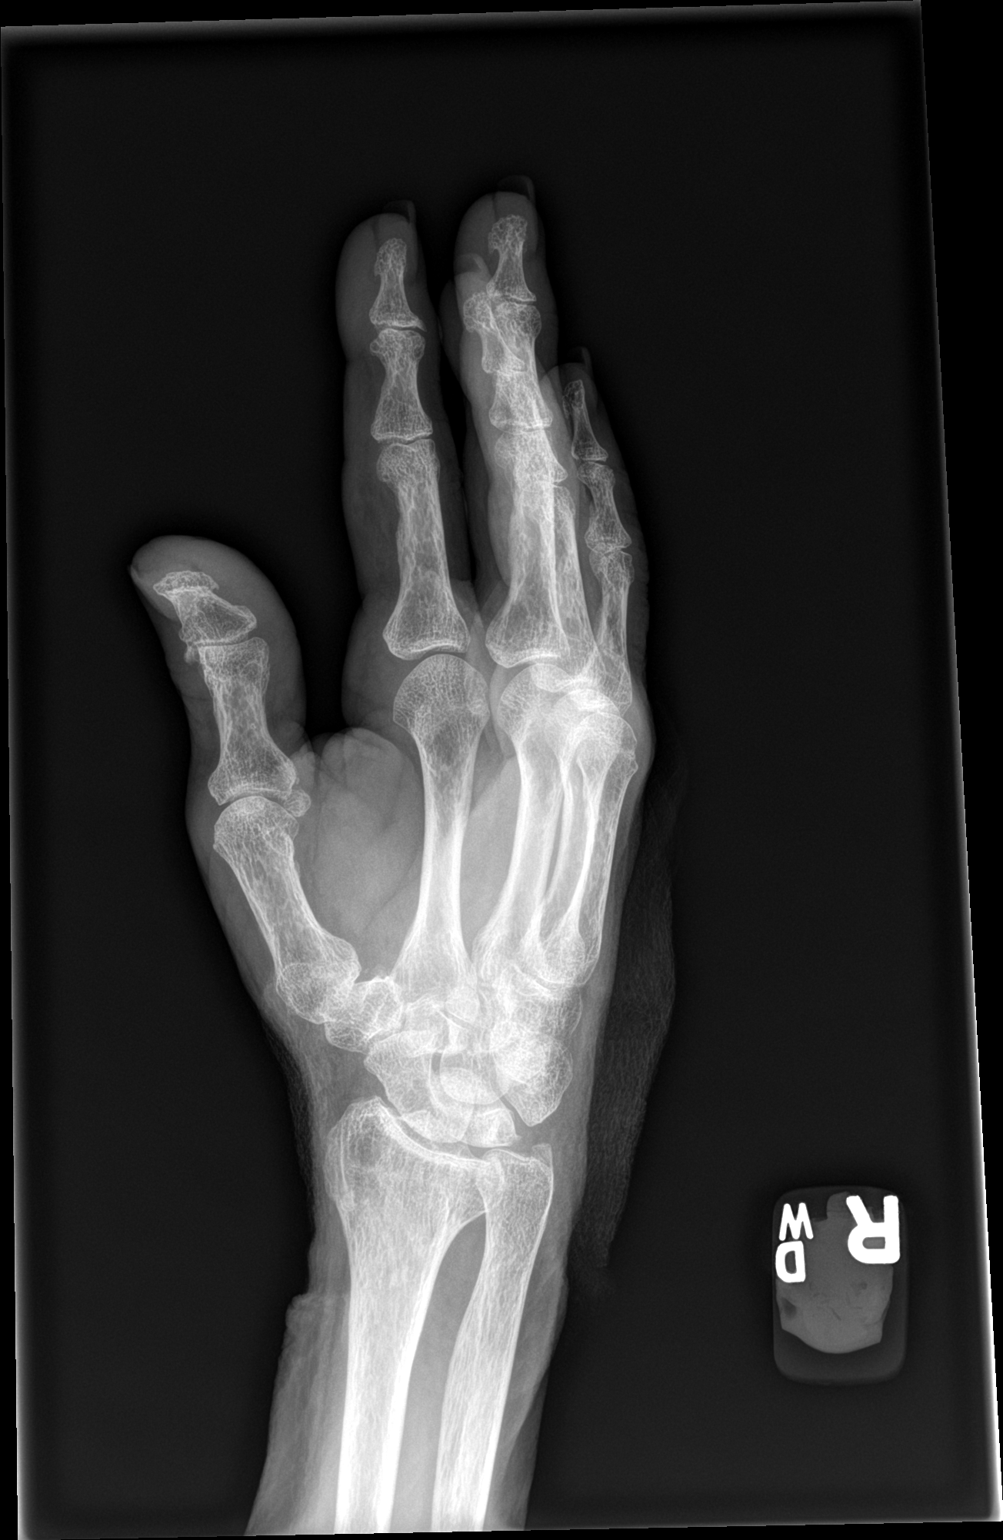
[im 3/3]
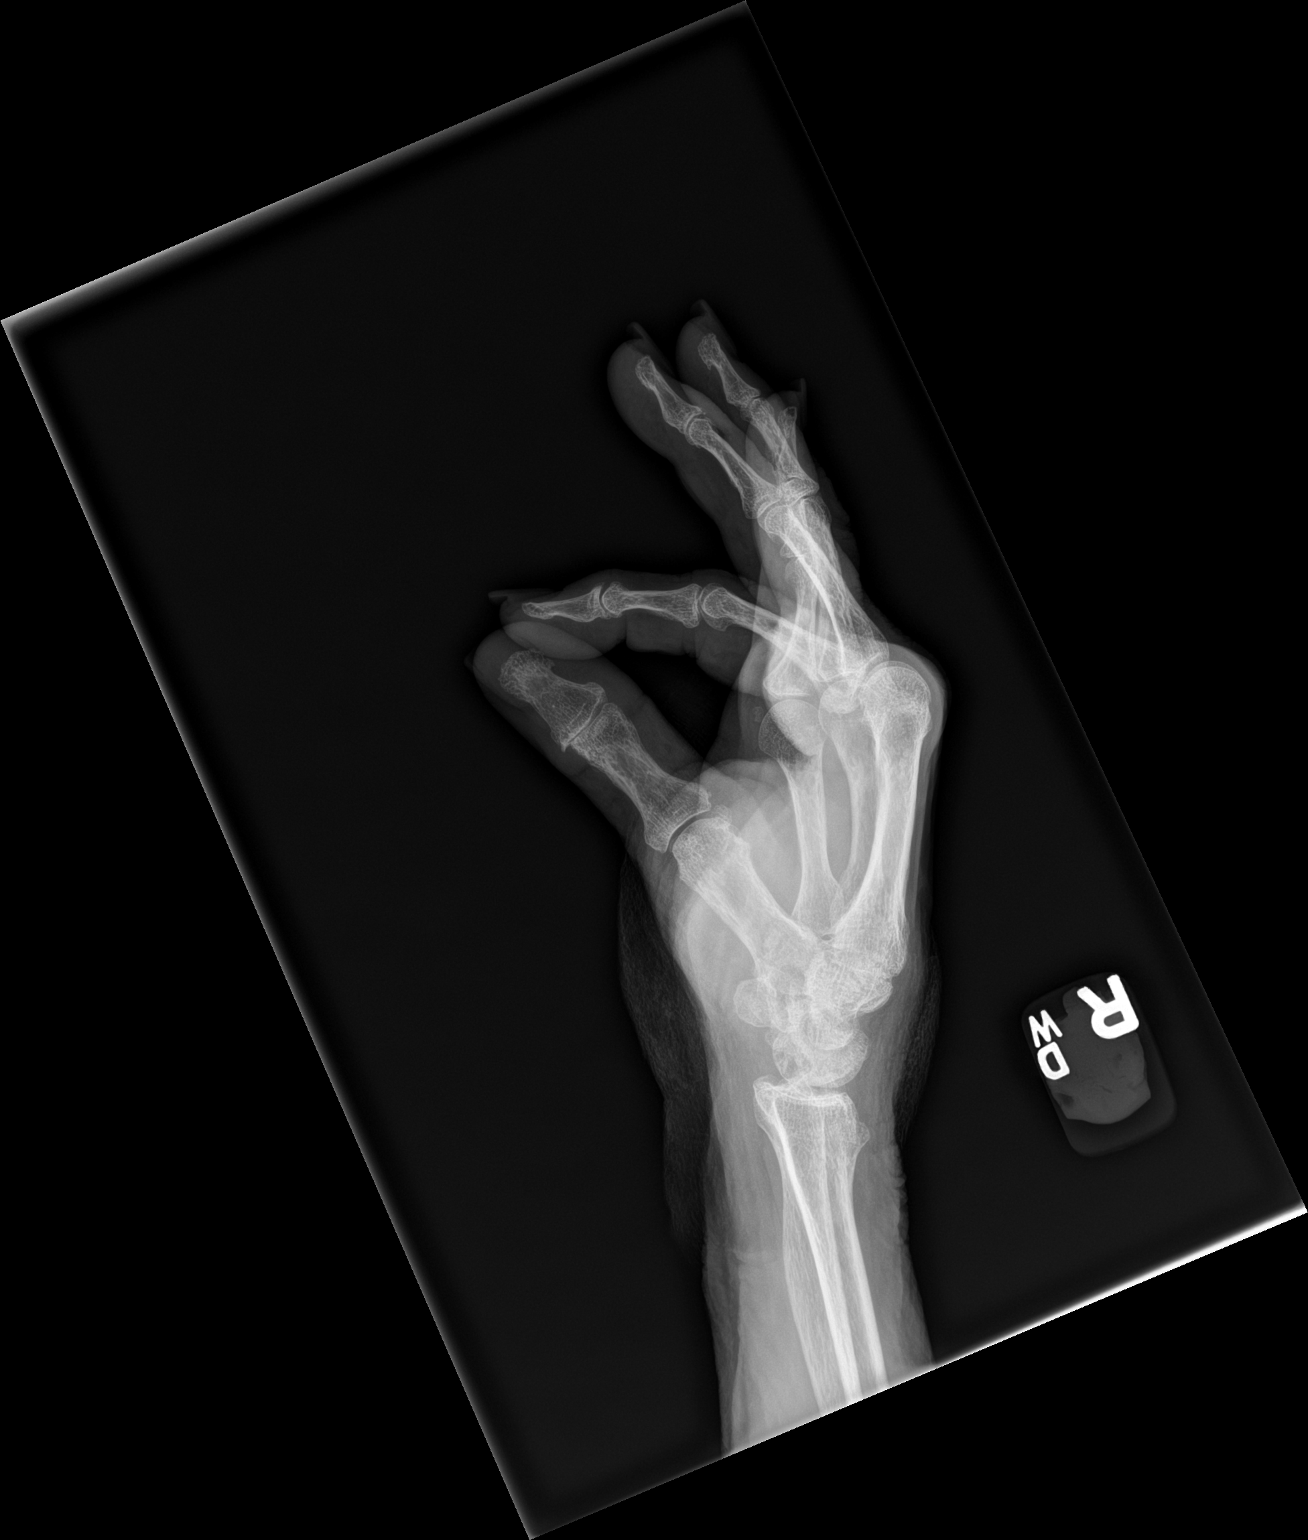

[3 of 3 positions shown; findings below may reference images not displayed]

FINDINGS: Degenerative changes in the IP joints, 1st carpometacarpal joint and
radiocarpal joint. No acute bony abnormality. Specifically, no
fracture, subluxation, or dislocation. No radiopaque foreign bodies.
IMPRESSION: No acute bony abnormality.

## 2021-01-29 MED ORDER — LIDOCAINE HCL (PF) 1 % IJ SOLN
5.0000 mL | Freq: Once | INTRAMUSCULAR | Status: AC
  Administered 2021-01-29: 5 mL
  Filled 2021-01-29: qty 5

## 2021-01-29 NOTE — ED Triage Notes (Signed)
Slicer jammed, when she applied a little pressure on slicer it unjammed cutting hand below thumb. Pressure bandage applied

## 2021-01-29 NOTE — ED Notes (Signed)
ED Provider at bedside. - doing suturing of the wound

## 2021-01-29 NOTE — ED Provider Notes (Signed)
[facility] EMERGENCY DEPT Provider Note   CSN: [id number] Arrival date & time: 01/29/21  1823     History Chief Complaint  Patient presents with  . Laceration    Denise Payne is a 75 y.o. female.  Patient with laceration to her right palm on a meat cutter device.  Patient was trying to cut up potatoes hand slipped and it went into the blade.  Machine was clean.  Patient's tetanus is up-to-date she states.  She is right-hand dominant.  Patient is not on blood thinners.  Patient denies any numbness to her fingers.  Past medical history is significant for surgery and syndrome and asthma.        Past Medical History:  Diagnosis Date  . Anemia   . Asthma   . Cataract   . GERD (gastroesophageal reflux disease)   . Osteoporosis   . Sjogren's syndrome ([place]) 01/26/2019    Patient Active Problem List   Diagnosis Date Noted  . Atherosclerosis of aorta ([place]) 12/15/2020  . Pancreatic cyst 12/15/2020  . Benign essential HTN 04/12/2020  . GERD (gastroesophageal reflux disease) 03/12/2020  . Osteopenia 03/12/2020  . Status post laparoscopic-assisted sigmoidectomy 05/22/2019  . 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 Asthma 01/26/2019  . Sjogren's syndrome ([place]) 01/26/2019  . Diverticulitis of large intestine with perforation 01/25/2019    Past Surgical History:  Procedure Laterality Date  . ABDOMINAL HYSTERECTOMY  1996  . ANKLE FRACTURE SURGERY  2005  . AUGMENTATION MAMMAPLASTY Bilateral 10/16/1976  . CHOLECYSTECTOMY N/A 12/07/2020   Procedure: LAPAROSCOPIC CHOLECYSTECTOMY WITH INTRAOPERATIVE CHOLANGIOGRAM;  Surgeon: Kinsinger, Arta Bruce, MD;  Location: [facility];  Service: General;  Laterality: N/A;  . CYSTOSCOPY WITH STENT PLACEMENT N/A 05/22/2019   Procedure: CYSTOSCOPY WITH STENT PLACEMENT;  Surgeon: Irine Seal, MD;  Location: [facility];  Service: Urology;  Laterality: N/A;  . FLEXIBLE  SIGMOIDOSCOPY N/A 05/22/2019   Procedure: FLEXIBLE SIGMOIDOSCOPY;  Surgeon: Ileana Roup, MD;  Location: [facility];  Service: General;  Laterality: N/A;  . XI ROBOTIC ASSISTED LOWER ANTERIOR RESECTION Bilateral 05/22/2019   Procedure: XI ROBOTIC ASSISTED SIGMOIDECTOMY;  Surgeon: Ileana Roup, MD;  Location: [facility];  Service: General;  Laterality: Bilateral;     OB History   No obstetric history on file.     Family History  Problem Relation Age of Onset  . Breast cancer Mother 27  . Hearing loss Mother   . Heart disease Mother   . High Cholesterol Mother   . Hypertension Mother   . Cancer Father   . Hearing loss Father   . Stroke Father   . Transient ischemic attack Father   . Arthritis Maternal Grandmother   . Cancer Maternal Grandmother   . Early death Maternal Grandmother   . Diabetes Maternal Grandfather   . Early death Maternal Grandfather   . Hearing loss Maternal Grandfather   . Heart attack Maternal Grandfather   . Heart disease Maternal Grandfather   . Arthritis Paternal Grandmother   . Early death Paternal Grandmother   . Hearing loss Paternal Grandmother   . Hyperlipidemia Paternal Grandmother   . Early death Paternal Grandfather   . Kidney disease Paternal Grandfather   . Cancer Sister   . Hyperlipidemia Sister   . Hypertension Sister   . Birth defects Brother   . Cancer Brother   . Hyperlipidemia Brother     Social History   Tobacco Use  . Smoking status: Never Smoker  .  Smokeless tobacco: Never Used  Vaping Use  . Vaping Use: Never used  Substance Use Topics  . Alcohol use: Yes    Alcohol/week: 8.0 standard drinks    Types: 8 Glasses of wine per week    Comment: 3 times daily  . Drug use: Never    Home Medications Prior to Admission medications   Medication Sig Start Date End Date Taking? Authorizing Provider  acetaminophen (TYLENOL) 500 MG tablet Take 1,000 mg by mouth every 6 (six) hours as needed for mild pain.   Yes [provider]  aspirin EC 81 MG tablet Take 81 mg by mouth every evening.    Yes [provider]  BIOTIN PO Take 1 tablet by mouth daily.   Yes [provider]  Cholecalciferol (VITAMIN D) 50 MCG (2000 UT) tablet Take 2,000 Units by mouth daily.   Yes [provider]  diclofenac sodium (VOLTAREN) 1 % GEL Apply 2 g topically 2 (two) times daily as needed (pain).    Yes [provider]  esomeprazole (NEXIUM) 40 MG capsule TAKE 1 CAPSULE DAILY Patient taking differently: Take 40 mg by mouth daily. 12/02/20  Yes Orma Flaming, MD  Ginger, Zingiber officinalis, (GINGER PO) Take by mouth.   Yes [provider]  hydroxychloroquine (PLAQUENIL) 200 MG tablet Take 200 mg by mouth 2 (two) times daily.   Yes [provider]  lisinopril (ZESTRIL) 40 MG tablet Take 1 tablet (40 mg total) by mouth daily. 06/16/20  Yes Orma Flaming, MD  Melatonin 10 MG TABS Take 10 mg by mouth at bedtime as needed (sleep).    Yes [provider]  montelukast (SINGULAIR) 10 MG tablet Take 1 tablet (10 mg total) by mouth at bedtime. 05/26/20  Yes Orma Flaming, MD  naproxen sodium (ALEVE) 220 MG tablet Take 440 mg by mouth daily as needed (pain).   Yes [provider]  rosuvastatin (CRESTOR) 10 MG tablet Take 1 tablet (10 mg total) by mouth daily. 12/29/20  Yes Orma Flaming, MD  Turmeric (QC TUMERIC COMPLEX PO) Take by mouth.   Yes [provider]  vitamin B-12 (CYANOCOBALAMIN) 1000 MCG tablet Take 1,000 mcg by mouth daily.    Yes [provider]  albuterol (PROVENTIL HFA;VENTOLIN HFA) 108 (90 Base) MCG/ACT inhaler Inhale 2 puffs into the lungs every 6 (six) hours as needed for wheezing or shortness of breath.    [provider]  cyclobenzaprine (FLEXERIL) 10 MG tablet Take 1 tablet (10 mg total) by mouth 3 (three) times daily as needed for muscle spasms. 03/12/20   Orma Flaming, MD  oxyCODONE (OXY IR/ROXICODONE) 5 MG immediate  release tablet Take 1 tablet (5 mg total) by mouth every 6 (six) hours as needed for moderate pain (post-operative pain not relieved by tylenol and ibuprofen). 12/15/20   Orma Flaming, MD  Probiotic Product (PROBIOTIC PO) Take 1 tablet by mouth daily.    [provider]    Allergies    Latex, Morphine and related, and Onion  Review of Systems   Review of Systems  Constitutional: Negative for chills and fever.  HENT: Negative for rhinorrhea and sore throat.   Eyes: Negative for visual disturbance.  Respiratory: Negative for cough and shortness of breath.   Cardiovascular: Negative for chest pain and leg swelling.  Gastrointestinal: Negative for abdominal pain, diarrhea, nausea and vomiting.  Genitourinary: Negative for dysuria.  Musculoskeletal: Negative for back pain and neck pain.  Skin: Positive for wound. Negative for rash.  Neurological: Negative for dizziness, light-headedness and headaches.  Hematological: Does not bruise/bleed easily.  Psychiatric/Behavioral: Negative for confusion.    Physical Exam Updated Vital Signs BP (!) 151/50 (BP Location: Right Arm)   Pulse 74   Temp 98.5 F (36.9 C) (Oral)   Resp 16   Ht 1.588 m (5' 2.5")   Wt 69.9 kg   SpO2 100%   BMI 27.72 kg/m   Physical Exam Vitals and nursing note reviewed.  Constitutional:      General: She is not in acute distress.    Appearance: Normal appearance. She is well-developed.  HENT:     Head: Normocephalic and atraumatic.  Eyes:     Extraocular Movements: Extraocular movements intact.     Conjunctiva/sclera: Conjunctivae normal.     Pupils: Pupils are equal, round, and reactive to light.  Cardiovascular:     Rate and Rhythm: Normal rate and regular rhythm.     Heart sounds: No murmur heard.   Pulmonary:     Effort: Pulmonary effort is normal. No respiratory distress.     Breath sounds: Normal breath sounds.  Abdominal:     Palpations: Abdomen is soft.     Tenderness: There is no  abdominal tenderness.  Musculoskeletal:        General: Signs of injury present.     Cervical back: Neck supple.     Comments: Right palm with an avulsion flap on the hyperthenar part of the palm.  That probably is a C-shaped avulsion the diameter would be only 4 cm.  The circumference would probably be 7 cm.  It is not deep.  Does get into the hand adipose area.  It is attached just about with a 1 cm area approximately.  So it is essentially a skin flap.  And it is somewhat devascularized.  No evidence of any tendon or bony structures below no evidence of any foreign bodies.  Distally cap refill is less than 2 seconds.  Sensation intact.  Good movement of all fingers and opposition of thumb and little finger.  Skin:    General: Skin is warm and dry.     Capillary Refill: Capillary refill takes less than 2 seconds.  Neurological:     General: No focal deficit present.     Mental Status: She is alert and oriented to person, place, and time.     Cranial Nerves: No cranial nerve deficit.     Sensory: No sensory deficit.     Motor: No weakness.     ED Results / Procedures / Treatments   Labs (all labs ordered are listed, but only abnormal results are displayed) Labs Reviewed - No data to display  EKG None  Radiology DG Hand Complete Right  Result Date: 01/29/2021 CLINICAL DATA:  Laceration at base of thumb EXAM: RIGHT HAND - COMPLETE 3+ VIEW COMPARISON:  None. FINDINGS: Degenerative changes in the IP joints, 1st carpometacarpal joint and radiocarpal joint. No acute bony abnormality. Specifically, no fracture, subluxation, or dislocation. No radiopaque foreign bodies. IMPRESSION: No acute bony abnormality. Electronically Signed   By: Rolm Baptise M.D.   On: 01/29/2021 19:36    Procedures .Marland KitchenLaceration Repair  Date/Time: 01/30/2021 12:54 AM Performed by: Fredia Sorrow, MD Authorized by: Fredia Sorrow, MD   Consent:    Consent obtained:  Verbal   Consent given by:  Patient    Risks, benefits, and alternatives were discussed: yes     Risks discussed:  Poor wound healing and infection   Alternatives  discussed:  No treatment Universal protocol:    Procedure explained and questions answered to patient or proxy's satisfaction: yes     Imaging studies available: yes     Site/side marked: yes     Immediately prior to procedure, a time out was called: yes     Patient identity confirmed:  Verbally with patient Anesthesia:    Anesthesia method:  Local infiltration   Local anesthetic:  Lidocaine 1% w/o epi Laceration details:    Location:  Hand   Hand location:  R palm   Length (cm):  7 Pre-procedure details:    Preparation:  Patient was prepped and draped in usual sterile fashion and imaging obtained to evaluate for foreign bodies Exploration:    Hemostasis achieved with:  Direct pressure   Imaging outcome: foreign body not noted     Wound exploration: wound explored through full range of motion and entire depth of wound visualized     Contaminated: no   Treatment:    Area cleansed with:  Shur-Clens and saline   Amount of cleaning:  Extensive   Irrigation solution:  Sterile saline   Irrigation volume:  100   Irrigation method:  Syringe   Visualized foreign bodies/material removed: no     Debridement:  Minimal   Undermining:  Minimal   Scar revision: no   Skin repair:    Repair method:  Sutures   Suture size:  4-0   Suture material:  Prolene   Number of sutures:  11 Approximation:    Approximation:  Loose Repair type:    Repair type:  Complex Post-procedure details:    Dressing:  Non-adherent dressing, sterile dressing and bulky dressing   Procedure completion:  Tolerated well, no immediate complications     Medications Ordered in ED Medications  lidocaine (PF) (XYLOCAINE) 1 % injection 5 mL (has no administration in time range)    ED Course  I have reviewed the triage vital signs and the nursing notes.  Pertinent labs & imaging results that  were available during my care of the patient were reviewed by me and considered in my medical decision making (see chart for details).    MDM Rules/Calculators/A&P                          I am consuming an extensive repair to try to tack down the skin flap.  It may end up just being a biological dressing.  The flap may not take.  Hand dressing was applied.  Wound was cleaned after suturing Xeroform gauze applied balled up 4 x 4's into the palm to help apply pressure.  Then wrapped in Kerlix and wrapped with Ace wrap.  Patient has follow-up with her hand surgeon which is more for joint arthritis on Tuesday.  So we will have patient just keep everything intact dressing wise and keep it dry until that time.  Evidence of any structural abnormalities X-ray showed no bony abnormalities or any foreign bodies.    Final Clinical Impression(s) / ED Diagnoses Final diagnoses:  Laceration of right hand without foreign body, initial encounter    Rx / DC Orders ED Discharge Orders    None       Fredia Sorrow, MD 01/30/21 [phone]

## 2021-01-29 NOTE — Discharge Instructions (Addendum)
Keep the hand dressing in place to follow-up with your hand surgeon on Tuesday.  Take the tramadol as needed for pain.  Try to elevate the right arm is much as possible.  Keep the wound dry until seen by hand surgeon.  Suture removal and 7 to 10 days.  Nursing will apply an Ace wrap to it.  As we discussed it is a flap and it may or may not survive.  But either way it is a good biological dressing

## 2021-02-01 DIAGNOSIS — M65312 Trigger thumb, left thumb: Secondary | ICD-10-CM | POA: Diagnosis not present

## 2021-02-01 DIAGNOSIS — M65311 Trigger thumb, right thumb: Secondary | ICD-10-CM | POA: Diagnosis not present

## 2021-02-01 DIAGNOSIS — S52551A Other extraarticular fracture of lower end of right radius, initial encounter for closed fracture: Secondary | ICD-10-CM | POA: Diagnosis not present

## 2021-02-01 DIAGNOSIS — M79642 Pain in left hand: Secondary | ICD-10-CM | POA: Diagnosis not present

## 2021-02-01 DIAGNOSIS — M65332 Trigger finger, left middle finger: Secondary | ICD-10-CM | POA: Diagnosis not present

## 2021-02-08 DIAGNOSIS — M65332 Trigger finger, left middle finger: Secondary | ICD-10-CM | POA: Diagnosis not present

## 2021-02-08 DIAGNOSIS — M65312 Trigger thumb, left thumb: Secondary | ICD-10-CM | POA: Diagnosis not present

## 2021-02-08 DIAGNOSIS — S52551A Other extraarticular fracture of lower end of right radius, initial encounter for closed fracture: Secondary | ICD-10-CM | POA: Diagnosis not present

## 2021-02-08 DIAGNOSIS — M65311 Trigger thumb, right thumb: Secondary | ICD-10-CM | POA: Diagnosis not present

## 2021-02-08 DIAGNOSIS — S61411A Laceration without foreign body of right hand, initial encounter: Secondary | ICD-10-CM | POA: Diagnosis not present

## 2021-03-02 DIAGNOSIS — Z79899 Other long term (current) drug therapy: Secondary | ICD-10-CM | POA: Diagnosis not present

## 2021-03-02 DIAGNOSIS — H04123 Dry eye syndrome of bilateral lacrimal glands: Secondary | ICD-10-CM | POA: Diagnosis not present

## 2021-03-02 DIAGNOSIS — H35389 Toxic maculopathy, unspecified eye: Secondary | ICD-10-CM | POA: Diagnosis not present

## 2021-03-02 DIAGNOSIS — T372X5A Adverse effect of antimalarials and drugs acting on other blood protozoa, initial encounter: Secondary | ICD-10-CM | POA: Diagnosis not present

## 2021-03-07 ENCOUNTER — Other Ambulatory Visit: Payer: Self-pay

## 2021-03-07 ENCOUNTER — Ambulatory Visit (INDEPENDENT_AMBULATORY_CARE_PROVIDER_SITE_OTHER): Payer: Medicare Other | Admitting: Family Medicine

## 2021-03-07 ENCOUNTER — Ambulatory Visit (INDEPENDENT_AMBULATORY_CARE_PROVIDER_SITE_OTHER): Payer: Medicare Other

## 2021-03-07 ENCOUNTER — Ambulatory Visit: Payer: Self-pay

## 2021-03-07 VITALS — BP 130/76 | HR 66 | Ht 62.5 in | Wt 158.8 lb

## 2021-03-07 DIAGNOSIS — M25562 Pain in left knee: Secondary | ICD-10-CM | POA: Diagnosis not present

## 2021-03-07 DIAGNOSIS — M7050 Other bursitis of knee, unspecified knee: Secondary | ICD-10-CM | POA: Diagnosis not present

## 2021-03-07 IMAGING — DX DG KNEE AP/LAT W/ SUNRISE*L*
3 series · 3 of 3 positions shown · non-contrast
Comparison: None.

CLINICAL DATA: Anterior left knee pain and swelling for 4-5 weeks

No known injury
EXAM:
LEFT KNEE 3 VIEWS

[knee ap]
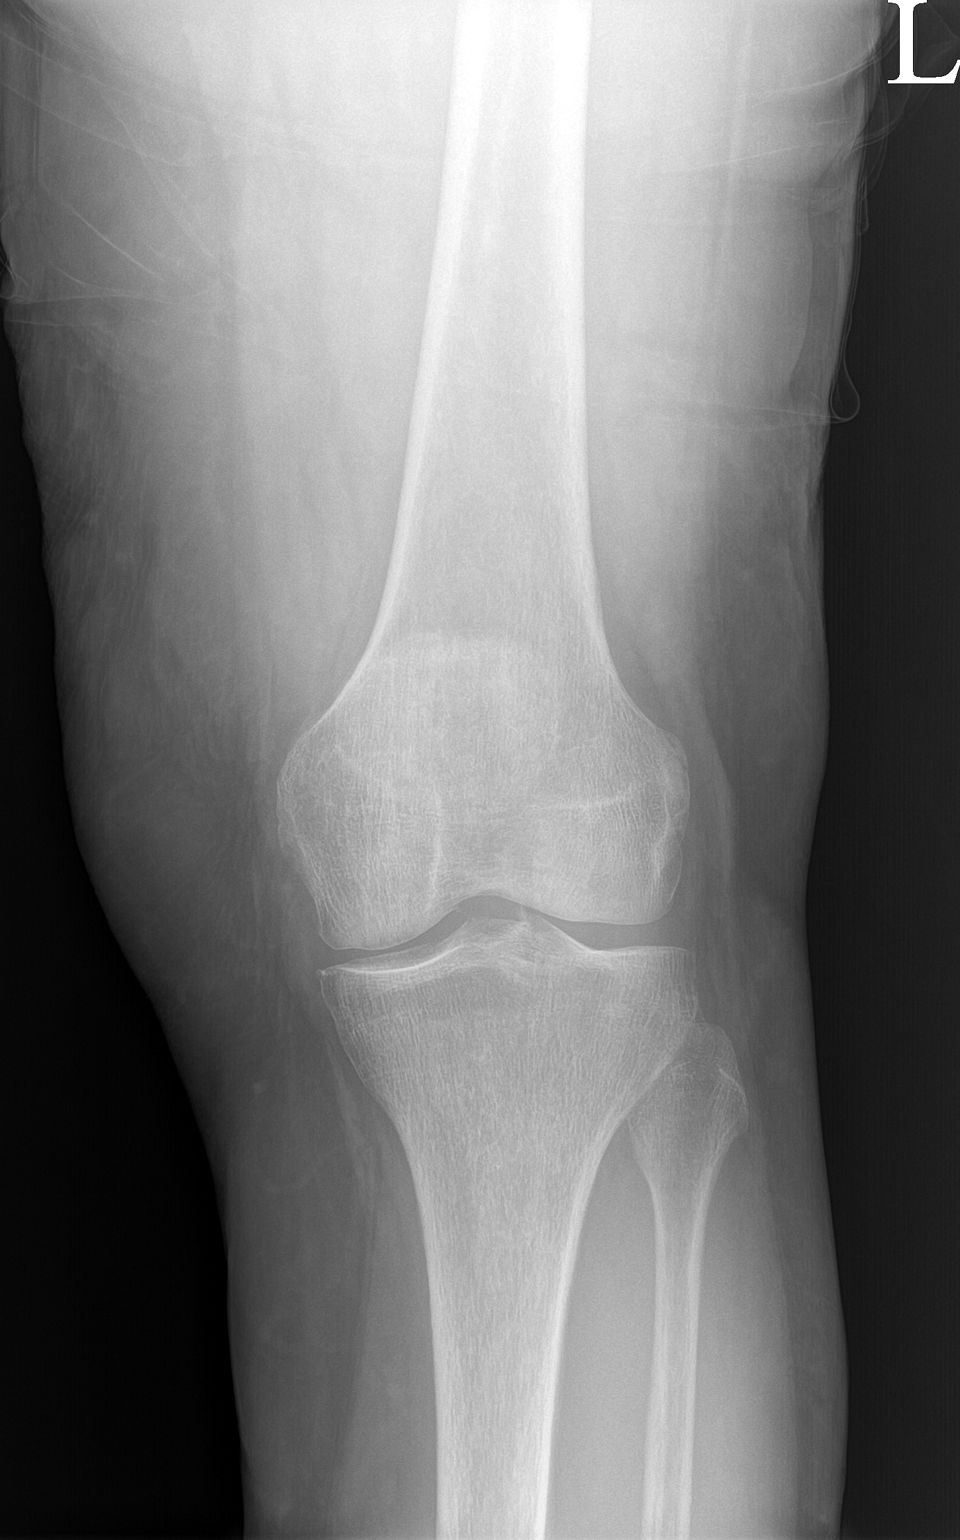

[knee lat]
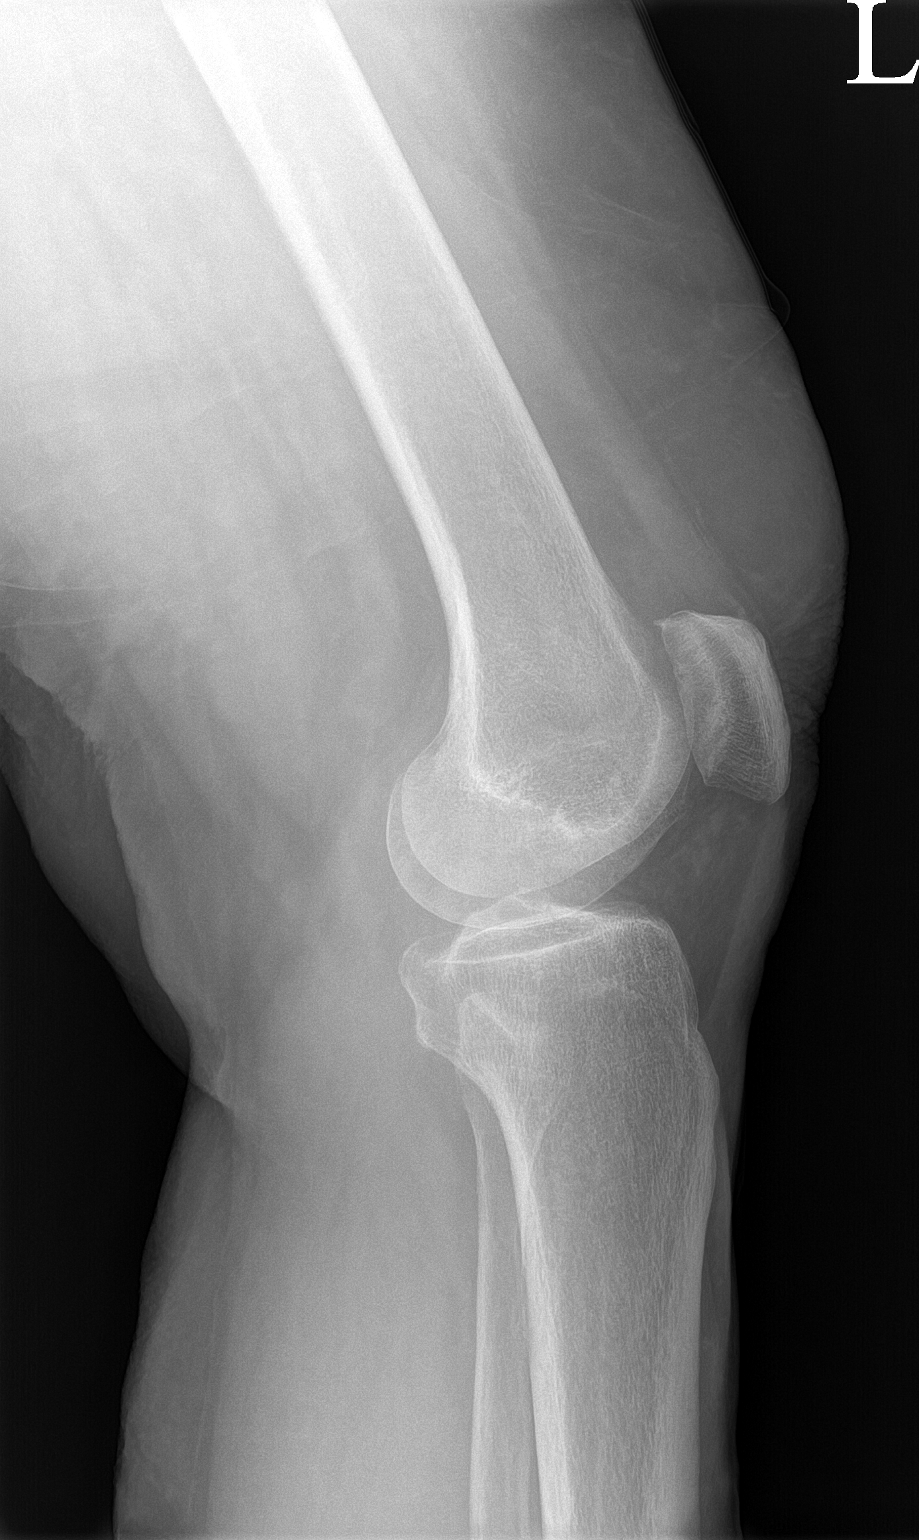

[patella]
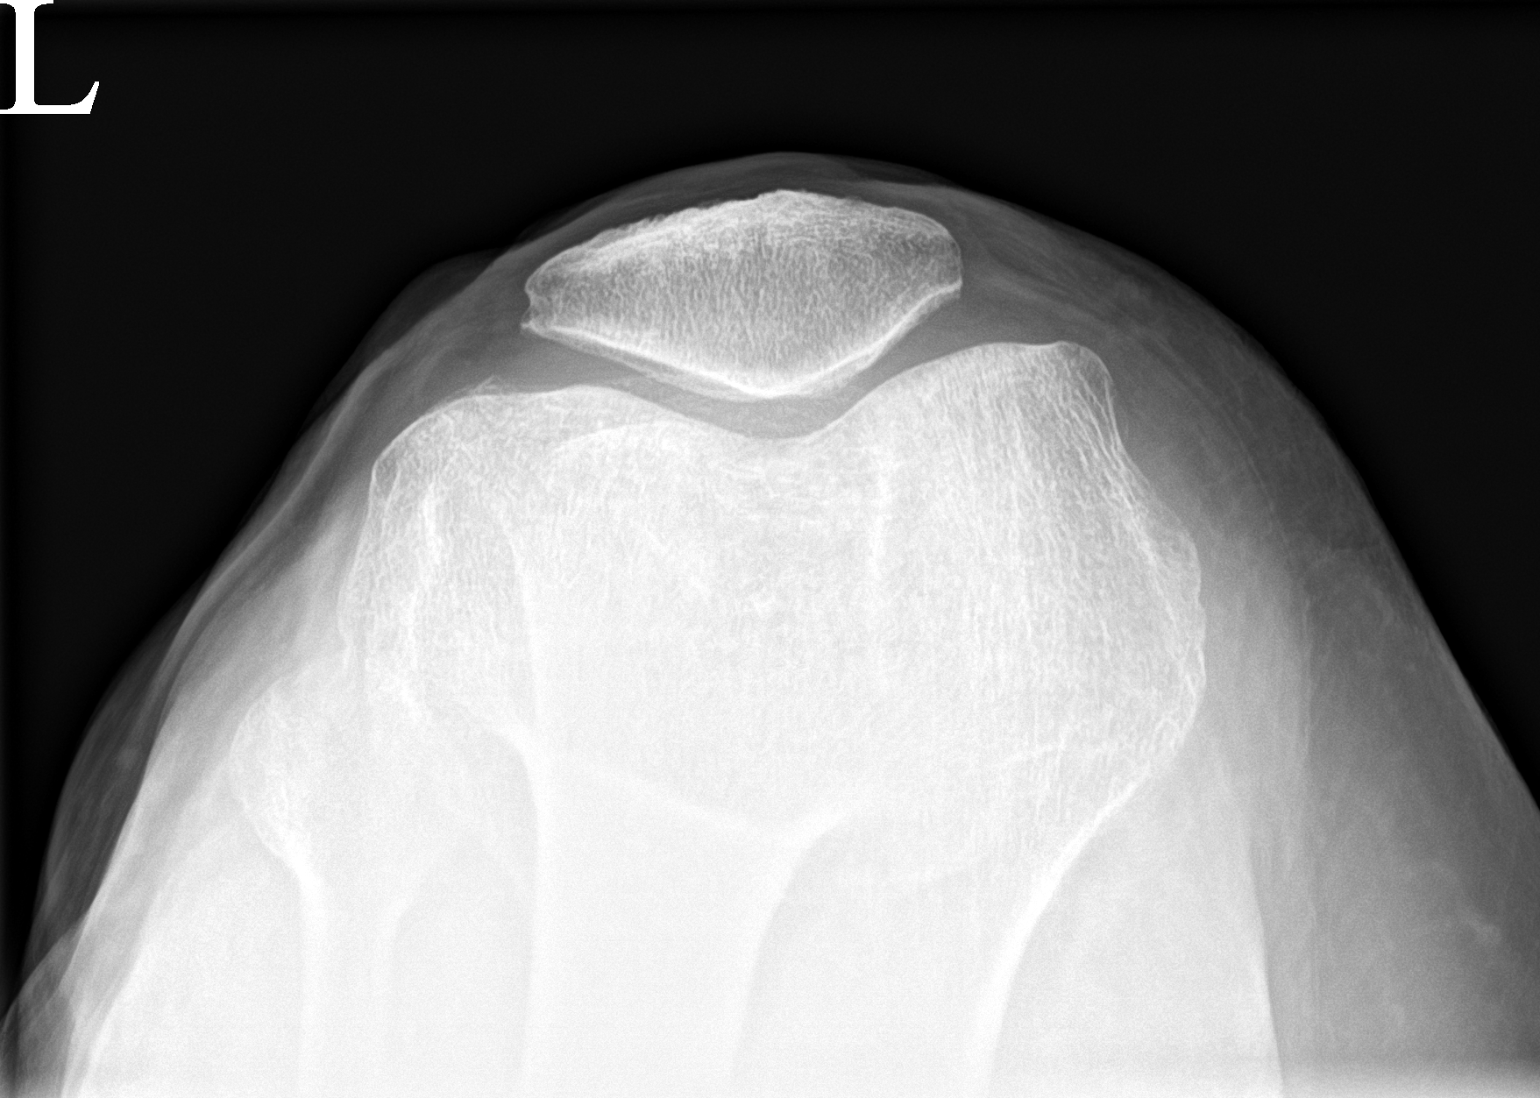

[3 of 3 positions shown; findings below may reference images not displayed]

FINDINGS: No evidence of fracture, dislocation, or joint effusion. No evidence
of arthropathy or other focal bone abnormality. Soft tissues are
unremarkable.
IMPRESSION: Negative.

## 2021-03-07 NOTE — Progress Notes (Signed)
I, Denise Payne, LAT, ATC acting as a scribe for Denise Leader, MD.  Subjective:    CC: Left knee pain  HPI: Pt is a 75 y/o female c/o L knee pain ongoing since early April. Pt does not recall a mechanism, but just woke up in the middle of the night w/ shooting pain. Pt notes she has a hx of arthritis and has had prior steroid injections. Pt locates her pain to the anterior aspect of L knee. Pt c/o pain increasing at night and is having trouble sleeping. Pt describes pain as a "burning pain."  L knee swelling: yes Mechanical symptoms: no Aggravates: sleeping, walking, standing Treatments tried: Voltaren gel, naproxen  Pertinent review of Systems: no fever or chills  Relevant historical information: Her husband died of esophageal cancer recently.  She is a little worried about her risk of cancer as well.   Objective:    Vitals:   03/07/21 1338  BP: 130/76  Pulse: 66  SpO2: 97%   General: Well Developed, well nourished, and in no acute distress.   MSK: Left knee normal-appearing Normal motion with crepitation. Tender palpation at the pes anserine bursa area. Stable ligamentous exam. Positive medial McMurray's test. Intact strength.  Lab and Radiology Results  X-ray images left knee obtained today personally and independently interpreted. Mild medial compartment DJD.  No acute fractures.  No aggressive appearing bone lesions. Await formal radiology review  Procedure: Real-time Ultrasound Guided Injection of left knee pes anserine bursa Device: Philips Affiniti 50G Images permanently stored and available for review in PACS Ultrasound evaluation prior to injection reveals moderate Pes anserine bursitis.  Narrowed medial knee joint line with medial meniscus tear. Verbal informed consent obtained.  Discussed risks and benefits of procedure. Warned about infection bleeding damage to structures skin hypopigmentation and fat atrophy among others. Patient expresses  understanding and agreement Time-out conducted.   Noted no overlying erythema, induration, or other signs of local infection.   Skin prepped in a sterile fashion.   Local anesthesia: Topical Ethyl chloride.   With sterile technique and under real time ultrasound guidance:  40 mg of Kenalog and 2 mL of Marcaine injected into the pes anserine bursa. Fluid seen entering the bursa.   Completed without difficulty   Pain mildly resolved suggesting accurate placement of the medication.   Advised to call if fevers/chills, erythema, induration, drainage, or persistent bleeding.   Images permanently stored and available for review in the ultrasound unit.  Impression: Technically successful ultrasound guided injection.       Impression and Recommendations:    Assessment and Plan: 75 y.o. female with left knee pain.  Pain is mostly located over the Pez anserine bursa area.  Patient does have medial compartment degenerative changes on ultrasound and x-ray and a probable medial meniscus degenerative tear as well.  Additionally she does have Pez anserine bursitis seen on ultrasound.  Injection did not provide immediate fantastic pain relief.  Plan for conservative management also treat with injection including home exercise program Voltaren gel and recheck in the near future.  She is going to [place] next month.  Recheck before then if not better would recommend intra-articular steroid injection.  If still not better would recommend MRI.Denise Payne  PDMP not reviewed this encounter. Orders Placed This Encounter  Procedures  . [place] LIMITED JOINT SPACE STRUCTURES LOW LEFT(NO LINKED CHARGES)    Standing Status:   Future    Number of Occurrences:   1    Standing Expiration Date:  09/07/2021    Order Specific Question:   Reason for Exam (SYMPTOM  OR DIAGNOSIS REQUIRED)    Answer:   left knee pain    Order Specific Question:   Preferred imaging location?    Answer:   [place]  . DG Knee  AP/LAT W/[place] Left    Standing Status:   Future    Number of Occurrences:   1    Standing Expiration Date:   03/07/2022    Order Specific Question:   Reason for Exam (SYMPTOM  OR DIAGNOSIS REQUIRED)    Answer:   left knee pain    Order Specific Question:   Preferred imaging location?    Answer:   Denise Payne   No orders of the defined types were placed in this encounter.   Discussed warning signs or symptoms. Please see discharge instructions. Patient expresses understanding.   The above documentation has been reviewed and is accurate and complete Denise Payne, M.D.

## 2021-03-07 NOTE — Patient Instructions (Addendum)
Thank you for coming in today.  Please get an Xray today before you leave.  Call or go to the ER if you develop a large red swollen joint with extreme pain or oozing puss.   Please use Voltaren gel (Generic Diclofenac Gel) up to 4x daily for pain as needed.  This is available over-the-counter as both the name brand Voltaren gel and the generic diclofenac gel.  Please complete the exercises that the athletic trainer went over with you: View at [email or web address] using code: [id number].   Return prior to your trip.   If this shot does not help much at all return soon and I will do an injection into the knee joint.

## 2021-03-09 ENCOUNTER — Ambulatory Visit: Payer: Medicare Other | Admitting: Family Medicine

## 2021-03-09 NOTE — Progress Notes (Signed)
Left knee x-ray looks largely normal to radiology

## 2021-03-30 ENCOUNTER — Ambulatory Visit (INDEPENDENT_AMBULATORY_CARE_PROVIDER_SITE_OTHER): Payer: Medicare Other | Admitting: Family Medicine

## 2021-03-30 ENCOUNTER — Other Ambulatory Visit: Payer: Self-pay

## 2021-03-30 VITALS — Ht 62.5 in | Wt 152.0 lb

## 2021-03-30 DIAGNOSIS — M25562 Pain in left knee: Secondary | ICD-10-CM | POA: Diagnosis not present

## 2021-03-30 NOTE — Patient Instructions (Signed)
Thank you for coming in today.   You should hear from MRI scheduling within 1 week. If you do not hear please let me know.    Return after the MRI to review the results and likely proceed to injection.

## 2021-03-30 NOTE — Progress Notes (Signed)
   I, [name], LAT, ATC, am serving as scribe for Dr. Lynne Leader.  Denise Payne is a 75 y.o. female who presents to [place] at [facility] today for f/u of chronic knee pain that worsened in April 2022 w/ no known MOI.  She was last seen by Dr. Georgina Snell on 03/07/21 and had a L knee pes anserine bursa injection.  She was also shown a HEP and advised to use Voltaren gel.  Since her last visit, pt reports knee still wakes her up at night and has increased pain w/ lateral movements. Pt had increased pain for about 3 days after prior steroid injection.  Positive clicking and locking.  Diagnostic imaging: L knee XR- 03/07/21   Pertinent review of systems: No fevers or chills  Relevant historical information: Hypertension.  Sjogren's syndrome   Exam:  Ht 5' 2.5" (1.588 m)   Wt 152 lb (68.9 kg)   BMI 27.36 kg/m  General: Well Developed, well nourished, and in no acute distress.   MSK: Left knee normal-appearing Normal motion with crepitation. Tender palpation medial joint line. Pain without laxity to MCL stress testing. Positive medial McMurray's test. Intact strength.    Lab and Radiology Results  EXAM: LEFT KNEE 3 VIEWS   COMPARISON:  None.   FINDINGS: No evidence of fracture, dislocation, or joint effusion. No evidence of arthropathy or other focal bone abnormality. Soft tissues are unremarkable.   IMPRESSION: Negative.     Electronically Signed   By: Miachel Roux M.D.   On: 03/09/2021 10:13. I, Lynne Leader, personally (independently) visualized and performed the interpretation of the images attached in this note.      Assessment and Plan: 75 y.o. female with left knee pain worsening despite conservative management strategies.  Concern for meniscus tear or other mechanical cause of pain.  After discussion plan to proceed with MRI to further characterize cause of pain before proceeding with more injections or other conservative management  strategies.  MRI additionally potentially for surgical planning.  Recheck after MRI.   PDMP not reviewed this encounter. Orders Placed This Encounter  Procedures   MR Knee Left  Wo Contrast    Standing Status:   Future    Standing Expiration Date:   03/30/2022    Order Specific Question:   What is the patient's sedation requirement?    Answer:   No Sedation    Order Specific Question:   Does the patient have a pacemaker or implanted devices?    Answer:   No    Order Specific Question:   Preferred imaging location?    Answer:   [facility] (table limit-350lbs)   No orders of the defined types were placed in this encounter.    Discussed warning signs or symptoms. Please see discharge instructions. Patient expresses understanding.   The above documentation has been reviewed and is accurate and complete Lynne Leader, M.D.

## 2021-04-04 DIAGNOSIS — D1801 Hemangioma of skin and subcutaneous tissue: Secondary | ICD-10-CM | POA: Diagnosis not present

## 2021-04-04 DIAGNOSIS — D225 Melanocytic nevi of trunk: Secondary | ICD-10-CM | POA: Diagnosis not present

## 2021-04-04 DIAGNOSIS — L821 Other seborrheic keratosis: Secondary | ICD-10-CM | POA: Diagnosis not present

## 2021-04-04 DIAGNOSIS — D692 Other nonthrombocytopenic purpura: Secondary | ICD-10-CM | POA: Diagnosis not present

## 2021-04-04 DIAGNOSIS — Z85828 Personal history of other malignant neoplasm of skin: Secondary | ICD-10-CM | POA: Diagnosis not present

## 2021-04-06 DIAGNOSIS — M150 Primary generalized (osteo)arthritis: Secondary | ICD-10-CM | POA: Diagnosis not present

## 2021-04-06 DIAGNOSIS — Z6826 Body mass index (BMI) 26.0-26.9, adult: Secondary | ICD-10-CM | POA: Diagnosis not present

## 2021-04-06 DIAGNOSIS — M25561 Pain in right knee: Secondary | ICD-10-CM | POA: Diagnosis not present

## 2021-04-06 DIAGNOSIS — R5383 Other fatigue: Secondary | ICD-10-CM | POA: Diagnosis not present

## 2021-04-06 DIAGNOSIS — M3500 Sicca syndrome, unspecified: Secondary | ICD-10-CM | POA: Diagnosis not present

## 2021-04-06 DIAGNOSIS — M5136 Other intervertebral disc degeneration, lumbar region: Secondary | ICD-10-CM | POA: Diagnosis not present

## 2021-04-06 DIAGNOSIS — E663 Overweight: Secondary | ICD-10-CM | POA: Diagnosis not present

## 2021-04-09 ENCOUNTER — Other Ambulatory Visit: Payer: Self-pay

## 2021-04-09 ENCOUNTER — Ambulatory Visit (INDEPENDENT_AMBULATORY_CARE_PROVIDER_SITE_OTHER): Payer: Medicare Other

## 2021-04-09 DIAGNOSIS — M25562 Pain in left knee: Secondary | ICD-10-CM

## 2021-04-09 IMAGING — MR MR KNEE*L* W/O CM
7 series · 40 of 40 positions shown · non-contrast
Comparison: None.

CLINICAL DATA: Medial left knee pain.

EXAM:
MRI OF THE LEFT KNEE WITHOUT CONTRAST
TECHNIQUE: Multiplanar, multisequence MR imaging of the knee was performed. No
intravenous contrast was administered.

[Series 3: T2 fat-sat · axial · 4.0mm · 0.50mm/px · z∈[-69,+76]mm · 6 of 30 slices shown (1 of 3)]
[im 1/30]
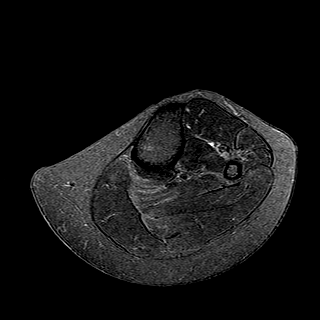
[im 6/30]
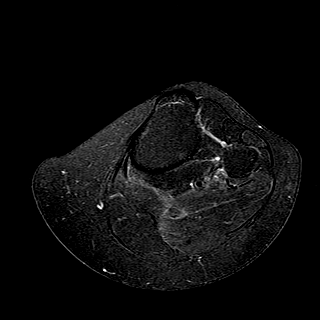
[im 12/30]
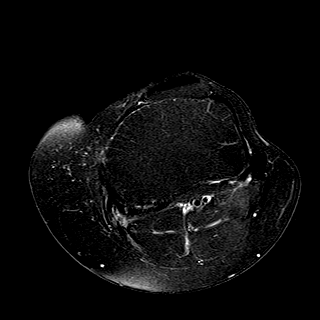
[im 18/30]
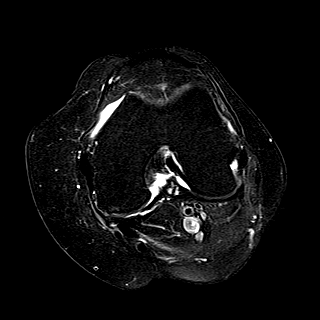
[im 24/30]
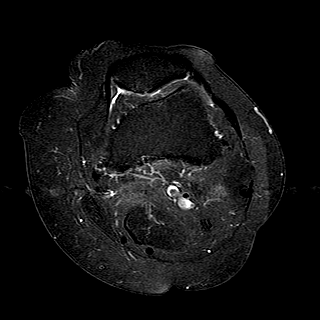
[im 30/30]
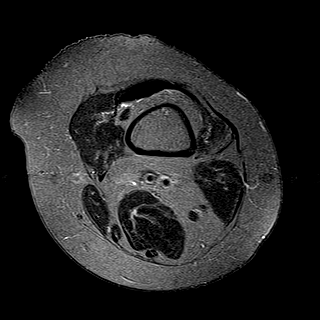

[Series 4: T1 · coronal · 4.0mm · 0.31mm/px · 5 of 24 slices shown]
[im 1/24]
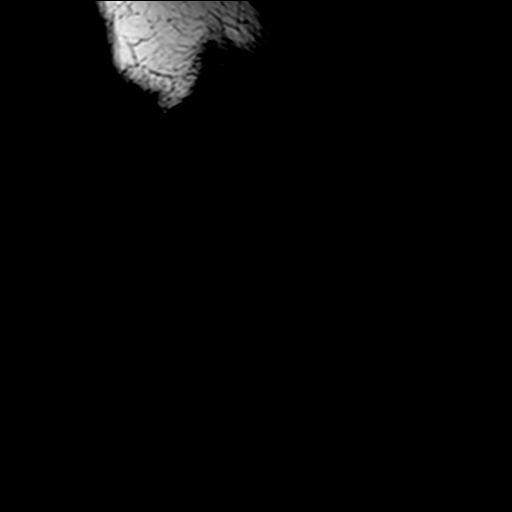
[im 6/24]
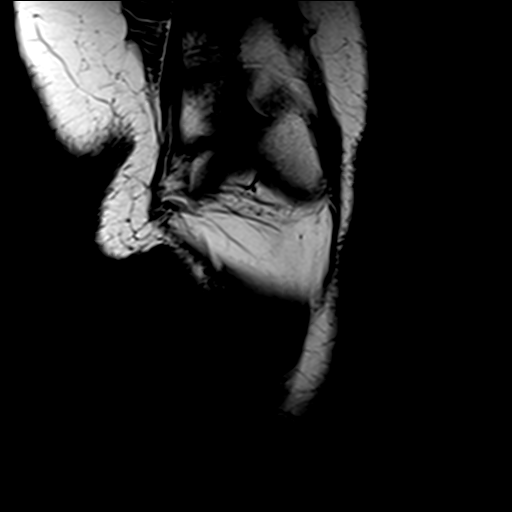
[im 12/24]
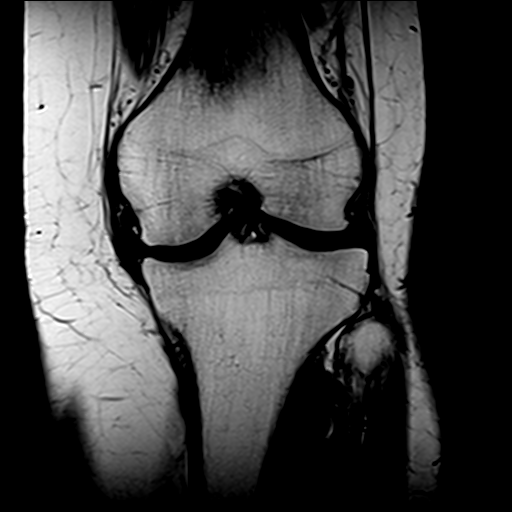
[im 18/24]
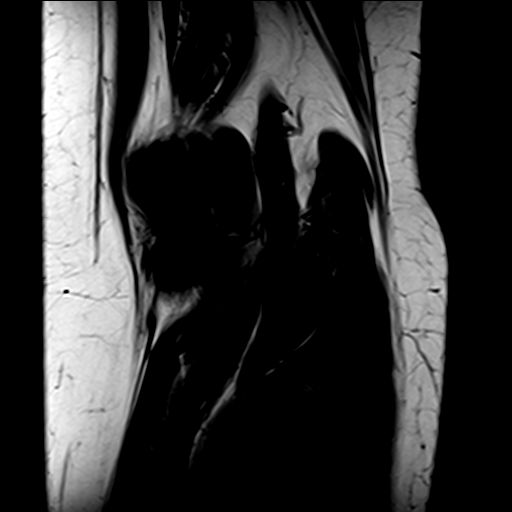
[im 24/24]
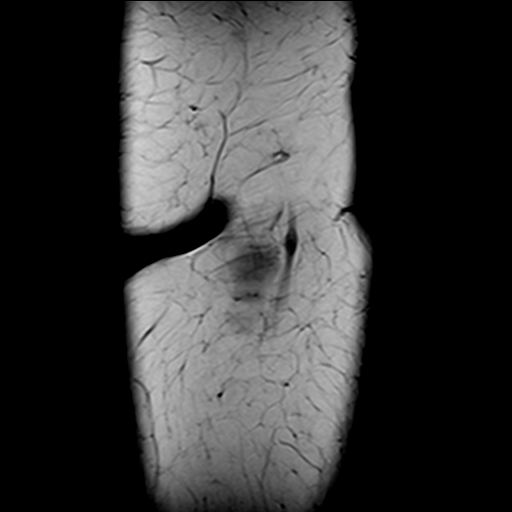

[Series 5: T2 fat-sat · coronal · 4.0mm · 0.62mm/px · 5 of 24 slices shown (2 of 3)]
[im 1/24]
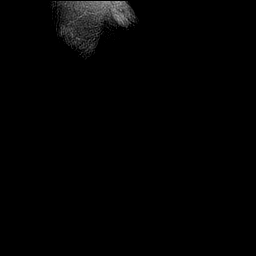
[im 6/24]
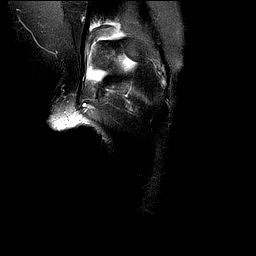
[im 12/24]
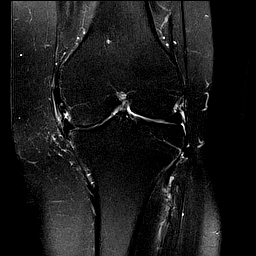
[im 18/24]
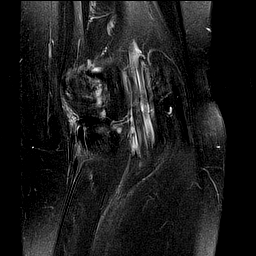
[im 24/24]
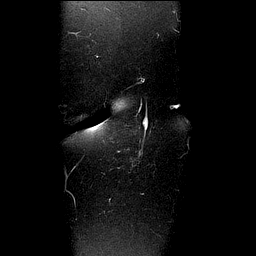

[Series 6: PD fat-sat · coronal · 4.0mm · 0.62mm/px · 5 of 24 slices shown (1 of 3)]
[im 1/24]
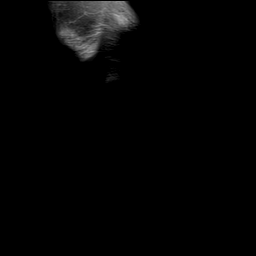
[im 6/24]
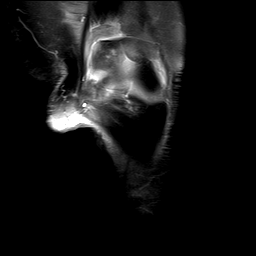
[im 12/24]
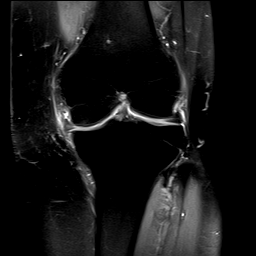
[im 18/24]
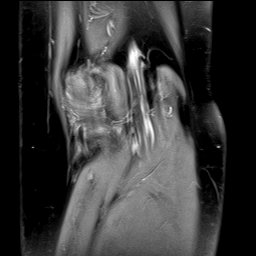
[im 24/24]
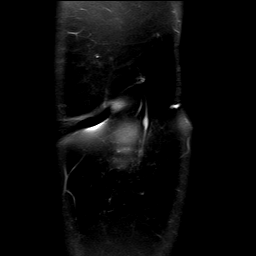

[Series 7: PD fat-sat · sagittal · 3.0mm · 0.59mm/px · 7 of 30 slices shown (2 of 3)]
[im 1/30]
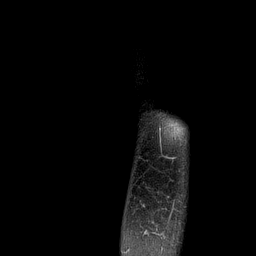
[im 5/30]
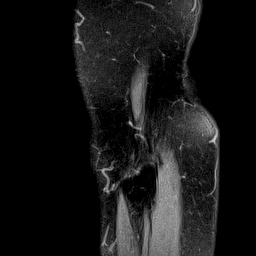
[im 10/30]
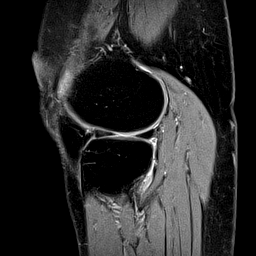
[im 15/30]
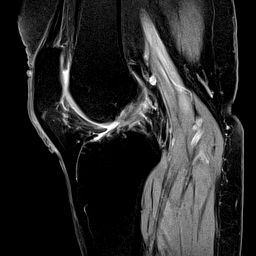
[im 20/30]
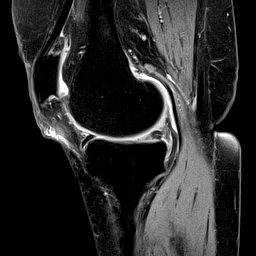
[im 25/30]
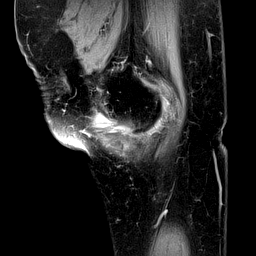
[im 30/30]
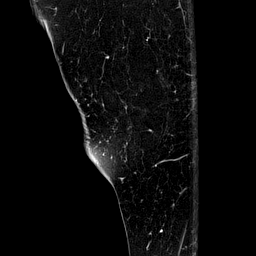

[Series 8: T2 fat-sat · sagittal · 3.0mm · 0.59mm/px · 7 of 30 slices shown (3 of 3)]
[im 1/30]
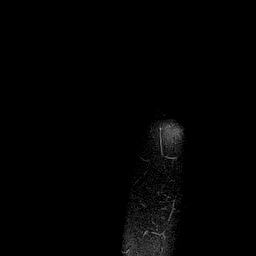
[im 5/30]
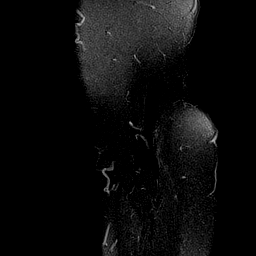
[im 10/30]
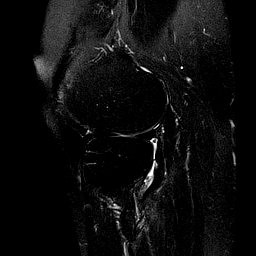
[im 15/30]
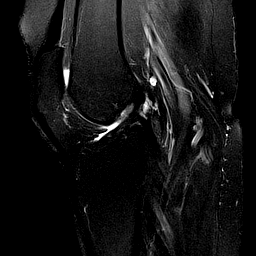
[im 20/30]
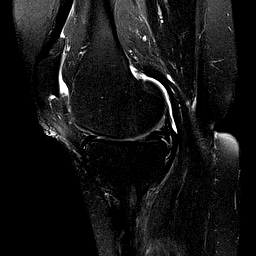
[im 25/30]
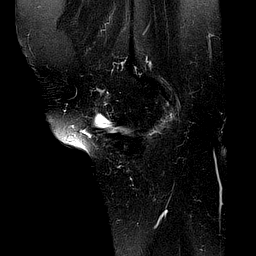
[im 30/30]
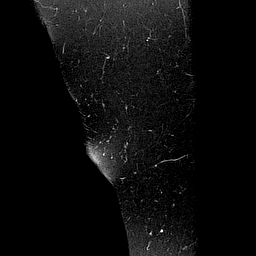

[Series 9: PD fat-sat · coronal · 2.0mm · 0.59mm/px · 5 of 22 slices shown (3 of 3)]
[im 1/22]
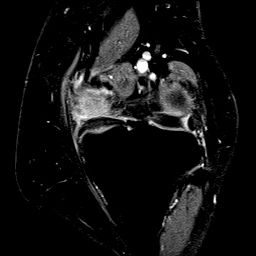
[im 6/22]
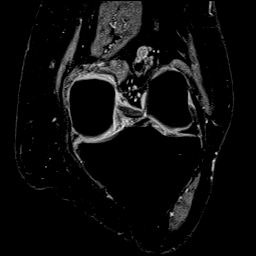
[im 11/22]
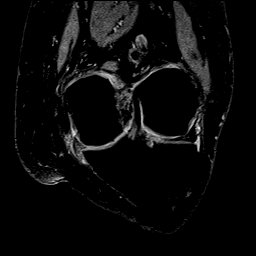
[im 16/22]
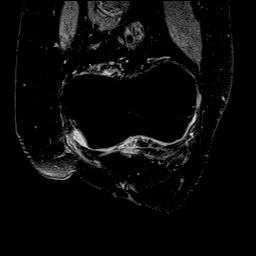
[im 22/22]
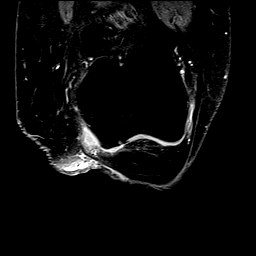

[40 of 40 positions shown; findings below may reference images not displayed]

FINDINGS: MENISCI

Medial: Degeneration of the posterior horn of the medial meniscus.
Complex tear of the posterior horn of the medial meniscus extending
to the posterior horn-body junction with peripheral meniscal
extrusion.

Lateral: Intact.

LIGAMENTS

Cruciates: ACL and PCL are intact.

Collaterals: Medial collateral ligament is intact. Lateral
collateral ligament complex is intact.

CARTILAGE

Patellofemoral: Partial-thickness cartilage loss of the
patellofemoral compartment.

Medial: Partial-thickness cartilage loss of the medial femorotibial
compartment

Lateral: Partial thickness cartilage loss of the lateral
femorotibial compartment.

JOINT: No joint effusion. Minimal edema in superolateral Hoffa's
fat. No plical thickening.

POPLITEAL FOSSA: Popliteus tendon is intact. No Baker's cyst.

EXTENSOR MECHANISM: Intact quadriceps tendon. Intact patellar
tendon. Intact lateral patellar retinaculum. Intact medial patellar
retinaculum. Intact MPFL.

BONES: No aggressive osseous lesion. No fracture or dislocation.

Other: No fluid collection or hematoma. Muscles are normal.
IMPRESSION: 1. Degeneration of the posterior horn of the medial meniscus.
Complex tear of the posterior horn of the medial meniscus extending
to the posterior horn-body junction with peripheral meniscal
extrusion.
2. Tricompartmental cartilage abnormalities as described above.

## 2021-04-11 NOTE — Progress Notes (Signed)
I, Denise Payne, LAT, ATC, am serving as scribe for Dr. Lynne Leader.  Denise Payne is a 75 y.o. female who presents to [place] at [facility] today for L knee pain and L knee MRI review.  She was last seen by Dr. Georgina Snell on 03/30/21 and was referred for a L knee MRI.  She had a prior L pes anserine bursa injection on 03/07/21 and was shown a HEP.  Since then, pt reports that her L knee is sore.  She states that her prior L pes anserine bursa injection worked well.    Diagnostic testing: L knee MRI- 04/09/21; L knee XR- 03/07/21   Pertinent review of systems: No fevers or chills  Relevant historical information: Hypertension   Exam:  BP 130/62 (BP Location: Right Arm, Patient Position: Sitting, Cuff Size: Normal)   Pulse 69   Ht 5' 2.5" (1.588 m)   Wt 149 lb 6.4 oz (67.8 kg)   SpO2 97%   BMI 26.89 kg/m  General: Well Developed, well nourished, and in no acute distress.   MSK: Left knee normal-appearing normal motion with crepitation.     Lab and Radiology Results No results found for this or any previous visit (from the past 72 hour(s)). MR Knee Left  Wo Contrast  Result Date: 04/11/2021 CLINICAL DATA:  Medial left knee pain. EXAM: MRI OF THE LEFT KNEE WITHOUT CONTRAST TECHNIQUE: Multiplanar, multisequence MR imaging of the knee was performed. No intravenous contrast was administered. COMPARISON:  None. FINDINGS: MENISCI Medial: Degeneration of the posterior horn of the medial meniscus. Complex tear of the posterior horn of the medial meniscus extending to the posterior horn-body junction with peripheral meniscal extrusion. Lateral: Intact. LIGAMENTS Cruciates: ACL and PCL are intact. Collaterals: Medial collateral ligament is intact. Lateral collateral ligament complex is intact. CARTILAGE Patellofemoral: Partial-thickness cartilage loss of the patellofemoral compartment. Medial: Partial-thickness cartilage loss of the medial femorotibial compartment Lateral:  Partial thickness cartilage loss of the lateral femorotibial compartment. JOINT: No joint effusion. Minimal edema in superolateral Hoffa's fat. No plical thickening. POPLITEAL FOSSA: Popliteus tendon is intact. No Baker's cyst. EXTENSOR MECHANISM: Intact quadriceps tendon. Intact patellar tendon. Intact lateral patellar retinaculum. Intact medial patellar retinaculum. Intact MPFL. BONES: No aggressive osseous lesion. No fracture or dislocation. Other: No fluid collection or hematoma. Muscles are normal. IMPRESSION: 1. 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 Complex tear of the posterior horn of the medial meniscus extending to the posterior horn-body junction with peripheral meniscal extrusion. 2. Tricompartmental cartilage abnormalities as described above. Electronically Signed   By: Kathreen Devoid   On: 04/11/2021 10:38    I, Lynne Leader, personally (independently) visualized and performed the interpretation of the images attached in this note.  Procedure: Real-time Ultrasound Guided Injection of left knee superior lateral patellar space Device: Philips Affiniti 50G Images permanently stored and available for review in PACS Verbal informed consent obtained.  Discussed risks and benefits of procedure. Warned about infection bleeding damage to structures skin hypopigmentation and fat atrophy among others. Patient expresses understanding and agreement Time-out conducted.   Noted no overlying erythema, induration, or other signs of local infection.   Skin prepped in a sterile fashion.   Local anesthesia: Topical Ethyl chloride.   With sterile technique and under real time ultrasound guidance: 40 mg of Kenalog and 2 mL of Marcaine injected into knee joint. Fluid seen entering the joint capsule.   Completed without difficulty   Pain immediately resolved suggesting accurate placement of  the medication.   Advised to call if fevers/chills, erythema, induration, drainage, or persistent  bleeding.   Images permanently stored and available for review in the ultrasound unit.  Impression: Technically successful ultrasound guided injection.      Assessment and Plan: 75 y.o. female with left knee pain primarily due to DJD.  Knee pain may also be somewhat due to degenerative meniscus tear seen on MRI.  Unlikely due to Pes anserine bursitis.  Plan for intra-articular steroid injection today Voltaren gel and exercises.  Recheck back as needed.  If not improved consider hyaluronic acid injection and subsequently surgical referral if needed.   PDMP not reviewed this encounter. Orders Placed This Encounter  Procedures   [place] LIMITED JOINT SPACE STRUCTURES LOW LEFT(NO LINKED CHARGES)    Standing Status:   Future    Number of Occurrences:   1    Standing Expiration Date:   04/12/2022    Order Specific Question:   Reason for Exam (SYMPTOM  OR DIAGNOSIS REQUIRED)    Answer:   Left knee pain    Order Specific Question:   Preferred imaging location?    Answer:   [facility]   No orders of the defined types were placed in this encounter.    Discussed warning signs or symptoms. Please see discharge instructions. Patient expresses understanding.   The above documentation has been reviewed and is accurate and complete Lynne Leader, M.D.

## 2021-04-12 ENCOUNTER — Encounter: Payer: Self-pay | Admitting: Family Medicine

## 2021-04-12 ENCOUNTER — Ambulatory Visit (INDEPENDENT_AMBULATORY_CARE_PROVIDER_SITE_OTHER): Payer: Medicare Other | Admitting: Family Medicine

## 2021-04-12 ENCOUNTER — Other Ambulatory Visit: Payer: Self-pay

## 2021-04-12 ENCOUNTER — Ambulatory Visit: Payer: Self-pay

## 2021-04-12 VITALS — BP 130/62 | HR 69 | Ht 62.5 in | Wt 149.4 lb

## 2021-04-12 DIAGNOSIS — M25562 Pain in left knee: Secondary | ICD-10-CM | POA: Diagnosis not present

## 2021-04-12 NOTE — Patient Instructions (Addendum)
Good to see you today.  You had a L knee injection today.  Call or go to the ER if you develop a large red swollen joint with extreme pain or oozing puss.   Follow-up as needed.  We can do these knee injections as often as every 3 months.

## 2021-04-12 NOTE — Progress Notes (Signed)
Left knee MRI shows degenerative tearing of the medial meniscus at the back of the knee as well as arthritis in the knee.  We will discuss this further during your follow-up visit with me today.

## 2021-05-03 ENCOUNTER — Telehealth: Payer: Self-pay | Admitting: Family Medicine

## 2021-05-03 NOTE — Telephone Encounter (Signed)
Pt recd steroid injection R knee 6/28, it has not helped.  She is leaving for a 3 week trip to [place] on 7/31 and would be interested in getting gel before this trip, if possible.

## 2021-05-03 NOTE — Telephone Encounter (Signed)
Correction L knee

## 2021-05-03 NOTE — Telephone Encounter (Signed)
We will start the authorization process now for the gel shot for the left knee.  This will be challenging to get authorized and completed before your trip on the 31st.  It can take up to 2 weeks for the insurance company to respond to the request.  I have started the request process now and you can expect to hear something soon.

## 2021-05-04 NOTE — Telephone Encounter (Signed)
Case submitted

## 2021-05-04 NOTE — Telephone Encounter (Signed)
[place] authorized, scheduled with pt for 05/05/2021.

## 2021-05-05 ENCOUNTER — Other Ambulatory Visit: Payer: Self-pay

## 2021-05-05 ENCOUNTER — Ambulatory Visit: Payer: Self-pay

## 2021-05-05 ENCOUNTER — Ambulatory Visit (INDEPENDENT_AMBULATORY_CARE_PROVIDER_SITE_OTHER): Payer: Medicare Other | Admitting: Family Medicine

## 2021-05-05 DIAGNOSIS — M1712 Unilateral primary osteoarthritis, left knee: Secondary | ICD-10-CM

## 2021-05-05 DIAGNOSIS — M25562 Pain in left knee: Secondary | ICD-10-CM

## 2021-05-05 NOTE — Patient Instructions (Signed)
Good to see you today.  You had a L knee Monovisc injection.  Call or go to the ER if you develop a large red swollen joint with extreme pain or oozing puss.   Follow-up as needed.

## 2021-05-05 NOTE — Progress Notes (Signed)
Denise Payne presents to clinic today for Monovisc injection left knee  Procedure: Real-time Ultrasound Guided Injection of left knee superior lateral patellar space Device: Philips Affiniti 50G Images permanently stored and available for review in PACS Verbal informed consent obtained.  Discussed risks and benefits of procedure. Warned about infection bleeding damage to structures skin hypopigmentation and fat atrophy among others. Patient expresses understanding and agreement Time-out conducted.   Noted no overlying erythema, induration, or other signs of local infection.   Skin prepped in a sterile fashion.   Local anesthesia: Topical Ethyl chloride.   With sterile technique and under real time ultrasound guidance: Monovisc 4 mL injected into knee joint. Fluid seen entering the joint capsule.   Completed without difficulty   Advised to call if fevers/chills, erythema, induration, drainage, or persistent bleeding.   Images permanently stored and available for review in the ultrasound unit.  Impression: Technically successful ultrasound guided injection.  Lot #[id number]  Recheck as needed.

## 2021-05-19 ENCOUNTER — Encounter: Payer: Medicare Other | Admitting: Family Medicine

## 2021-06-08 ENCOUNTER — Other Ambulatory Visit: Payer: Self-pay

## 2021-06-08 ENCOUNTER — Telehealth (INDEPENDENT_AMBULATORY_CARE_PROVIDER_SITE_OTHER): Payer: Medicare Other | Admitting: Physician Assistant

## 2021-06-08 ENCOUNTER — Encounter: Payer: Self-pay | Admitting: Physician Assistant

## 2021-06-08 VITALS — Ht 62.5 in | Wt 149.5 lb

## 2021-06-08 DIAGNOSIS — U071 COVID-19: Secondary | ICD-10-CM | POA: Diagnosis not present

## 2021-06-08 DIAGNOSIS — Z20822 Contact with and (suspected) exposure to covid-19: Secondary | ICD-10-CM | POA: Diagnosis not present

## 2021-06-08 MED ORDER — NIRMATRELVIR/RITONAVIR (PAXLOVID)TABLET: 3.0000 | ORAL_TABLET | Freq: Two times a day (BID) | ORAL | 0 refills | Status: AC

## 2021-06-08 NOTE — Progress Notes (Signed)
Virtual Visit via Video Note  I connected with  Denise Payne  on 06/08/21 at  9:00 AM EDT by a video enabled telemedicine application and verified that I am speaking with the correct person using two identifiers.  Location: Patient: home Provider: Therapist, music at [place] present: Patient and myself   I discussed the limitations of evaluation and management by telemedicine and the availability of in person appointments. The patient expressed understanding and agreed to proceed.   History of Present Illness: Chief complaint: COVID+ 06/07/21 Symptom onset: 06/05/21 Pertinent positives: Body aches, runny nose, sneezing, congestion, headache, chest tightness, cough Pertinent negatives: Fever, chills, chest pain or pressure, SOB Treatments tried: Sudafed, Aleve, Tylenol Vaccine status: Pfizer x 2 plus two boosters Sick exposure: ? Just returned from trip to [place]  She has not had COVID previously.  Hx of Sjogren's disease.    Observations/Objective:   Gen: Awake, alert, no acute distress Resp: Breathing is even and non-labored Psych: calm/pleasant demeanor Neuro: Alert and Oriented x 3, + facial symmetry, speech is clear.   Assessment and Plan: 1. COVID-19 Diagnosis confirmed via home antigen test. We discussed current algorithm recommendations for prescribing outpatient antivirals.  She is over the age of 34 and she is within 5 day window of symptom onset. Risks versus benefits discussed. Will start on Paxlovid at this time. Advised to stop statin for the next 10 days. Possible SE discussed. Renal function >60.  Advised self-isolation at home for the next 5 days and then masking around others for at least an additional 5 days.  Treat supportively at this time as well including sleeping prone, deep breathing exercises, pushing fluids, walking every few hours, vitamins C and D, and Tylenol or ibuprofen as needed.  The patient understands that COVID-19 illness  can wax and wane.  Should the symptoms acutely worsen or patient starts to experience sudden shortness of breath, chest pain, severe weakness, the patient will go straight to the emergency department.  Also advised home pulse oximetry monitoring and for any reading consistently under 92%, should also report to the emergency department.  The patient will continue to keep [place] updated.   Follow Up Instructions:    I discussed the assessment and treatment plan with the patient. The patient was provided an opportunity to ask questions and all were answered. The patient agreed with the plan and demonstrated an understanding of the instructions.   The patient was advised to call back or seek an in-person evaluation if the symptoms worsen or if the condition fails to improve as anticipated.  Rishi Vicario M Shlomie Romig, PA-C

## 2021-06-08 NOTE — Patient Instructions (Signed)
Take the Paxlovid as directed. Please call with updates.

## 2021-06-24 ENCOUNTER — Ambulatory Visit: Payer: Medicare Other | Admitting: Family Medicine

## 2021-06-27 ENCOUNTER — Other Ambulatory Visit: Payer: Self-pay

## 2021-06-27 ENCOUNTER — Encounter: Payer: Self-pay | Admitting: Family Medicine

## 2021-06-27 ENCOUNTER — Ambulatory Visit (INDEPENDENT_AMBULATORY_CARE_PROVIDER_SITE_OTHER): Payer: Medicare Other | Admitting: Family Medicine

## 2021-06-27 VITALS — BP 116/55 | HR 68 | Temp 97.9°F | Ht 62.5 in | Wt 147.4 lb

## 2021-06-27 DIAGNOSIS — K219 Gastro-esophageal reflux disease without esophagitis: Secondary | ICD-10-CM

## 2021-06-27 DIAGNOSIS — Z23 Encounter for immunization: Secondary | ICD-10-CM | POA: Diagnosis not present

## 2021-06-27 DIAGNOSIS — M3500 Sicca syndrome, unspecified: Secondary | ICD-10-CM | POA: Diagnosis not present

## 2021-06-27 DIAGNOSIS — T378X5A Adverse effect of other specified systemic anti-infectives and antiparasitics, initial encounter: Secondary | ICD-10-CM | POA: Diagnosis not present

## 2021-06-27 DIAGNOSIS — I10 Essential (primary) hypertension: Secondary | ICD-10-CM

## 2021-06-27 DIAGNOSIS — J4520 Mild intermittent asthma, uncomplicated: Secondary | ICD-10-CM

## 2021-06-27 DIAGNOSIS — K915 Postcholecystectomy syndrome: Secondary | ICD-10-CM | POA: Diagnosis not present

## 2021-06-27 DIAGNOSIS — I700 Atherosclerosis of aorta: Secondary | ICD-10-CM

## 2021-06-27 DIAGNOSIS — M8580 Other specified disorders of bone density and structure, unspecified site: Secondary | ICD-10-CM

## 2021-06-27 DIAGNOSIS — H3500 Unspecified background retinopathy: Secondary | ICD-10-CM | POA: Diagnosis not present

## 2021-06-27 MED ORDER — CHOLESTYRAMINE 4 G PO PACK: 4.0000 g | PACK | Freq: Three times a day (TID) | ORAL | 12 refills | Status: DC

## 2021-06-27 MED ORDER — ESOMEPRAZOLE MAGNESIUM 40 MG PO CPDR: 40.0000 mg | DELAYED_RELEASE_CAPSULE | Freq: Every day | ORAL | 3 refills | Status: DC

## 2021-06-27 MED ORDER — LISINOPRIL 40 MG PO TABS: 40.0000 mg | ORAL_TABLET | Freq: Every day | ORAL | 3 refills | Status: DC

## 2021-06-27 MED ORDER — MONTELUKAST SODIUM 10 MG PO TABS: 10.0000 mg | ORAL_TABLET | Freq: Every day | ORAL | 3 refills | Status: DC

## 2021-06-27 MED ORDER — ROSUVASTATIN CALCIUM 10 MG PO TABS: 10.0000 mg | ORAL_TABLET | Freq: Every day | ORAL | 3 refills | Status: DC

## 2021-06-27 NOTE — Patient Instructions (Signed)
It was very nice to see you today!  You have a bruise on your eye.  This should not cause any issues going forward.  Please try the exercises for your neck.  Let me know if not improving.  Please try the cholestyramine to help with your GI issues.  We will get your flu shot today.  I will see you back in 6 to 12 months for your annual checkup with labs.  Please come back to see me sooner if needed.  Take care, Dr Jerline Pain  PLEASE NOTE:  If you had any lab tests please let us know if you have not heard back within a few days. You may see your results on mychart before we have a chance to review them but we will give you a call once they are reviewed by [place]. If we ordered any referrals today, please let us know if you have not heard from their office within the next week.   Please try these tips to maintain a healthy lifestyle:  Eat at least 3 REAL meals and 1-2 snacks per day.  Aim for no more than 5 hours between eating.  If you eat breakfast, please do so within one hour of getting up.   Each meal should contain half fruits/vegetables, one quarter protein, and one quarter carbs (no bigger than a computer mouse)  Cut down on sweet beverages. This includes juice, soda, and sweet tea.   Drink at least 1 glass of water with each meal and aim for at least 8 glasses per day  Exercise at least 150 minutes every week.

## 2021-06-27 NOTE — Assessment & Plan Note (Signed)
Follows with rheumatology.  On Plaquenil.

## 2021-06-27 NOTE — Addendum Note (Signed)
Addended by: Vivi Barrack on: 06/27/2021 03:36 PM   Modules accepted: Orders

## 2021-06-27 NOTE — Assessment & Plan Note (Signed)
Stable.  On Singulair and albuterol.

## 2021-06-27 NOTE — Assessment & Plan Note (Signed)
FRAX 16.8% based on last bone density scan.  We can repeat bone density scan in 2023.

## 2021-06-27 NOTE — Assessment & Plan Note (Signed)
We will start cholestyramine as needed with meals.

## 2021-06-27 NOTE — Assessment & Plan Note (Signed)
Follows with ophthalmology

## 2021-06-27 NOTE — Assessment & Plan Note (Signed)
At goal.  Continue lisinopril 40 mg daily.

## 2021-06-27 NOTE — Assessment & Plan Note (Signed)
Stable.  Continue Nexium  20mg daily

## 2021-06-27 NOTE — Progress Notes (Addendum)
Denise Payne is a 75 y.o. female who presents today for an office visit.  Assessment/Plan:  New/Acute Problems: Subconjunctival hemorrhage No red flags.  Reassured patient.  We will continue with watchful waiting  Neck pain Seem to be more muscular given positional nature.  Discussed home exercises and handout was given.  She has naproxen and Flexeril at home and she will start taking both of these.  If not improving would consider referral to sports med and/were PT.  Likely has underlying osteoarthritis which is contributing as well.  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  Atherosclerosis of aorta (HCC) On aspirin and statin.  Benign essential HTN At goal.  Continue lisinopril 40 mg daily.  Osteopenia FRAX 16.8% based on last bone density scan.  We can repeat bone density scan in 2023.  GERD (gastroesophageal reflux disease) Stable.  Continue Nexium 20 mg daily.  Sjogren's syndrome ([place]) Follows with rheumatology.  On Plaquenil.  Asthma Stable.  On Singulair and albuterol.  Flu vaccine given today.     Subjective:  HPI:  Patient here to transfer care.  Previous PCP no longer works in this office.  See A/P for status of chronic conditions.  She has had redness to her right eye for the last several days. She had cataract surgery 4 years ago, and since then when she moves her eye too fast she can feel the lens that was put in. 06/25/2021 the lens was bothering her, and she was attempting to adjust it which resulted in some sort of eye injury that she wanted examined.   She admits that she is taking an aspirin and turmeric.  She recently had been diagnosed a few weeks ago with covid after flying back to the [place]. from [place]. Symptoms included headache, rhinorrhea, coughing up phlegm. Her asthma is well managed.  She states  that she has started to notice butterfly rashes showing up, being more pronounced at night. Also, she has been failing vision tests which may be related to her sjogren's syndrome.  Hydroxychloroquine was useful in managing symptoms of sjogren's and rheumatoid arthritis, but she is not sure if reducing dosage resulted in her feeling more fatigued recently.  Additionally, she has started to develop lumps on her hands causing finger drawing/development of trigger finger.   Her left knee has a torn meniscus, which she has been going to sports medicine for treatment and therapy. She uses a brace to help manage the problem. The knee pain has been causing sleep complications.  She states that she has neck pain on the left side which makes it difficult for her to look left and down. The pain has a sudden onset which resolves quickly with massaging and ceasing movement in the aforementioned directions.       Objective:  Physical Exam: BP (!) 116/55   Pulse 68   Temp 97.9 F (36.6 C) (Temporal)   Ht 5' 2.5" (1.588 m)   Wt 147 lb 6.4 oz (66.9 kg)   SpO2 98%   BMI 26.53 kg/m   Gen: No acute distress, resting comfortably HEENT: Subconjunctival hemorrhage on the right eye. CV: Regular rate, with no murmurs appreciated Pulm: Normal work of breathing, clear to auscultation bilaterally with no crackles, wheezes, or rhonchi MSK: Neck without deformities.  Nontender to palpation.  Some pain with leftward rotation.  Full range of motion throughout. Neuro: Grossly normal, moves all extremities Psych: Normal affect  and thought content      I,Jordan Kelly,acting as a scribe for Dimas Chyle, MD.,have documented all relevant documentation on the behalf of Dimas Chyle, MD,as directed by  Dimas Chyle, MD while in the presence of Dimas Chyle, MD.  I, Dimas Chyle, MD, have reviewed all documentation for this visit. The documentation on 06/27/21 for the exam, diagnosis, procedures, and orders are all  accurate and complete.  Time Spent: 55 minutes of total time was spent on the date of the encounter performing the following actions: chart review prior to seeing the patient including visits from previous PCP, obtaining history, performing a medically necessary exam, counseling on the treatment plan, placing orders, and documenting in our EHR.    Algis Greenhouse. Jerline Pain, MD 06/27/2021 3:24 PM

## 2021-06-27 NOTE — Assessment & Plan Note (Signed)
On aspirin and statin °

## 2021-08-31 ENCOUNTER — Other Ambulatory Visit: Payer: Self-pay

## 2021-08-31 ENCOUNTER — Ambulatory Visit (INDEPENDENT_AMBULATORY_CARE_PROVIDER_SITE_OTHER): Payer: Medicare Other | Admitting: Family Medicine

## 2021-08-31 ENCOUNTER — Ambulatory Visit: Payer: Self-pay

## 2021-08-31 DIAGNOSIS — Z79899 Other long term (current) drug therapy: Secondary | ICD-10-CM | POA: Diagnosis not present

## 2021-08-31 DIAGNOSIS — M1712 Unilateral primary osteoarthritis, left knee: Secondary | ICD-10-CM

## 2021-08-31 DIAGNOSIS — H35363 Drusen (degenerative) of macula, bilateral: Secondary | ICD-10-CM | POA: Diagnosis not present

## 2021-08-31 DIAGNOSIS — H26492 Other secondary cataract, left eye: Secondary | ICD-10-CM | POA: Diagnosis not present

## 2021-08-31 DIAGNOSIS — H04123 Dry eye syndrome of bilateral lacrimal glands: Secondary | ICD-10-CM | POA: Diagnosis not present

## 2021-08-31 NOTE — Patient Instructions (Addendum)
Thank you for coming in today.   You received a gel injection in your left knee today. Seek immediate medical attention if the joint becomes red, extremely painful, or is oozing fluid.   Recheck back as needed

## 2021-08-31 NOTE — Progress Notes (Signed)
Denise Payne presents to clinic today for left knee injection Monovisc  Procedure: Real-time Ultrasound Guided Injection of left knee superior lateral patellar space Device: Philips Affiniti 50G Images permanently stored and available for review in PACS Verbal informed consent obtained.  Discussed risks and benefits of procedure. Warned about infection bleeding damage to structures skin hypopigmentation and fat atrophy among others. Patient expresses understanding and agreement Time-out conducted.   Noted no overlying erythema, induration, or other signs of local infection.   Skin prepped in a sterile fashion.   Local anesthesia: Topical Ethyl chloride.   With sterile technique and under real time ultrasound guidance: Monovisc 64ml injected into knee joint. Fluid seen entering the joint capsule.   Completed without difficulty   Pain immediately resolved suggesting accurate placement of the medication.   Advised to call if fevers/chills, erythema, induration, drainage, or persistent bleeding.   Images permanently stored and available for review in the ultrasound unit.  Impression: Technically successful ultrasound guided injection.  Lot [id number]  Return as needed

## 2021-09-01 ENCOUNTER — Ambulatory Visit (INDEPENDENT_AMBULATORY_CARE_PROVIDER_SITE_OTHER): Payer: Medicare Other

## 2021-09-01 DIAGNOSIS — Z0000 Encounter for general adult medical examination without abnormal findings: Secondary | ICD-10-CM

## 2021-09-01 NOTE — Progress Notes (Signed)
Virtual Visit via Telephone Note  I connected with  Alaina Jurek on 09/01/21 at  1:00 PM EST by telephone and verified that I am speaking with the correct person using two identifiers.  Medicare Annual Wellness visit completed telephonically due to Covid-19 pandemic.   Persons participating in this call: This Health Coach and this patient.   Location: Patient: Home Provider: Office   I discussed the limitations, risks, security and privacy concerns of performing an evaluation and management service by telephone and the availability of in person appointments. The patient expressed understanding and agreed to proceed.  Unable to perform video visit due to video visit attempted and failed and/or patient does not have video capability.   Some vital signs may be absent or patient reported.   Willette Brace, LPN   Subjective:   Dyan Labarbera is a 75 y.o. female who presents for Medicare Annual (Subsequent) preventive examination.  Review of Systems     Cardiac Risk Factors include: advanced age (>65men, >29 women);hypertension     Objective:    Today's Vitals   09/01/21 1307  PainSc: 5    There is no height or weight on file to calculate BMI.  Advanced Directives 09/01/2021 01/29/2021 12/06/2020 08/26/2020 05/22/2019 05/20/2019 02/15/2019  Does Patient Have a Medical Advance Directive? Yes No;Yes Yes Yes Yes Yes No  Type of Paramedic of [place];Living will Living will Living will Living will [place];Living will -  Does patient want to make changes to medical advance directive? - - No - Patient declined - No - Patient declined No - Patient declined -  Copy of [place] in Chart? No - copy requested - - - - No - copy requested -  Would patient like information on creating a medical advance directive? - - - - - - No - Patient declined    Current Medications (verified) Outpatient  Encounter Medications as of 09/01/2021  Medication Sig   acetaminophen (TYLENOL) 500 MG tablet Take 1,000 mg by mouth every 6 (six) hours as needed for mild pain.   aspirin EC 81 MG tablet Take 81 mg by mouth every evening.    BIOTIN PO Take 1 tablet by mouth daily.   Cholecalciferol (VITAMIN D) 50 MCG (2000 UT) tablet Take 2,000 Units by mouth daily.   cholestyramine (QUESTRAN) 4 g packet Take 1 packet (4 g total) by mouth 3 (three) times daily with meals.   cyclobenzaprine (FLEXERIL) 10 MG tablet Take 1 tablet (10 mg total) by mouth 3 (three) times daily as needed for muscle spasms.   diclofenac sodium (VOLTAREN) 1 % GEL Apply 2 g topically 2 (two) times daily as needed (pain).    esomeprazole (NEXIUM) 40 MG capsule Take 1 capsule (40 mg total) by mouth daily.   Ginger, Zingiber officinalis, (GINGER PO) Take by mouth.   hydroxychloroquine (PLAQUENIL) 200 MG tablet Take 200 mg by mouth daily.   lisinopril (ZESTRIL) 40 MG tablet Take 1 tablet (40 mg total) by mouth daily.   Melatonin 10 MG TABS Take 10 mg by mouth at bedtime as needed (sleep).    montelukast (SINGULAIR) 10 MG tablet Take 1 tablet (10 mg total) by mouth at bedtime.   naproxen sodium (ALEVE) 220 MG tablet Take 440 mg by mouth daily as needed (pain).   Probiotic Product (PROBIOTIC PO) Take 1 tablet by mouth daily.   rosuvastatin (CRESTOR) 10 MG tablet Take 1 tablet (10 mg total)  by mouth daily.   Turmeric (QC TUMERIC COMPLEX PO) Take by mouth.   vitamin B-12 (CYANOCOBALAMIN) 1000 MCG tablet Take 1,000 mcg by mouth daily.    albuterol (PROVENTIL HFA;VENTOLIN HFA) 108 (90 Base) MCG/ACT inhaler Inhale 2 puffs into the lungs every 6 (six) hours as needed for wheezing or shortness of breath. (Patient not taking: Reported on 09/01/2021)   No facility-administered encounter medications on file as of 09/01/2021.    Allergies (verified) Latex, Meperidine hcl, Morphine and related, and Onion   History: Past Medical History:   Diagnosis Date   Anemia    Asthma    Cataract    GERD (gastroesophageal reflux disease)    Osteoporosis    Sjogren's syndrome ([place]) 01/26/2019   Past Surgical History:  Procedure Laterality Date   ABDOMINAL HYSTERECTOMY  1996   ANKLE FRACTURE SURGERY  2005   AUGMENTATION MAMMAPLASTY Bilateral 10/16/1976   CHOLECYSTECTOMY N/A 12/07/2020   Procedure: LAPAROSCOPIC CHOLECYSTECTOMY WITH INTRAOPERATIVE CHOLANGIOGRAM;  Surgeon: Mickeal Skinner, MD;  Location: [facility];  Service: General;  Laterality: N/A;   CYSTOSCOPY WITH STENT PLACEMENT N/A 05/22/2019   Procedure: CYSTOSCOPY WITH STENT PLACEMENT;  Surgeon: Irine Seal, MD;  Location: [facility];  Service: Urology;  Laterality: N/A;   FLEXIBLE SIGMOIDOSCOPY N/A 05/22/2019   Procedure: FLEXIBLE SIGMOIDOSCOPY;  Surgeon: Ileana Roup, MD;  Location: [facility];  Service: General;  Laterality: N/A;   XI ROBOTIC ASSISTED LOWER ANTERIOR RESECTION Bilateral 05/22/2019   Procedure: XI ROBOTIC ASSISTED SIGMOIDECTOMY;  Surgeon: Ileana Roup, MD;  Location: [facility];  Service: General;  Laterality: Bilateral;   Family History  Problem Relation Age of Onset   Breast cancer Mother 49   Hearing loss Mother    Heart disease Mother    High Cholesterol Mother    Hypertension Mother    Cancer Father    Hearing loss Father    Stroke Father    Transient ischemic attack Father    Arthritis Maternal Grandmother    Cancer Maternal Grandmother    Early death Maternal Grandmother    Diabetes Maternal Grandfather    Early death Maternal Grandfather    Hearing loss Maternal Grandfather    Heart attack Maternal Grandfather    Heart disease Maternal Grandfather    Arthritis Paternal Grandmother    Early death Paternal Grandmother    Hearing loss Paternal Grandmother    Hyperlipidemia Paternal Grandmother    Early death Paternal Grandfather    Kidney disease Paternal 72    Cancer Sister    Hyperlipidemia Sister    Hypertension Sister     Birth defects Brother    Cancer Brother    Hyperlipidemia Brother    Social History   Socioeconomic History   Marital status: Widowed    Spouse name: Not on file   Number of children: Not on file   Years of education: Not on file   Highest education level: Not on file  Occupational History   Occupation: retired  Tobacco Use   Smoking status: Never   Smokeless tobacco: Never  Vaping Use   Vaping Use: Never used  Substance and Sexual Activity   Alcohol use: Yes    Alcohol/week: 8.0 standard drinks    Types: 8 Glasses of wine per week    Comment: 3 times daily   Drug use: Never   Sexual activity: Not Currently  Other Topics Concern   Not on file  Social History Narrative   Not on file   Social  Determinants of Health   Financial Resource Strain: Low Risk    Difficulty of Paying Living Expenses: Not hard at all  Food Insecurity: No Food Insecurity   Worried About [place] in the Last Year: Never true   Ran Out of Food in the Last Year: Never true  Transportation Needs: No Transportation Needs   Lack of Transportation (Medical): No   Lack of Transportation (Non-Medical): No  Physical Activity: Inactive   Days of Exercise per Week: 0 days   Minutes of Exercise per Session: 0 min  Stress: No Stress Concern Present   Feeling of Stress : Not at all  Social Connections: Moderately Isolated   Frequency of Communication with Friends and Family: More than three times a week   Frequency of Social Gatherings with Friends and Family: More than three times a week   Attends Religious Services: More than 4 times per year   Active Member of [employer] or Organizations: No   Attends Archivist Meetings: Never   Marital Status: Widowed    Tobacco Counseling Counseling given: Not Answered   Clinical Intake:  Pre-visit preparation completed: Yes  Pain : 0-10 Pain Score: 5  Pain Type: Chronic pain Pain Location: Knee Pain Orientation: Left Pain Descriptors /  Indicators: Sharp Pain Onset: More than a month ago Pain Frequency: Intermittent     BMI - recorded: 26.53 Nutritional Status: BMI 25 -29 Overweight Nutritional Risks: None  How often do you need to have someone help you when you read instructions, pamphlets, or other written materials from your doctor or pharmacy?: 1 - Never  Diabetic?no  Interpreter Needed?: No  Information entered by :: Charlott Rakes, LPN   Activities of Daily Living In your present state of health, do you have any difficulty performing the following activities: 09/01/2021 12/06/2020  Hearing? Y N  Comment wears hearing aids -  Vision? N N  Difficulty concentrating or making decisions? N N  Walking or climbing stairs? N N  Dressing or bathing? N N  Doing errands, shopping? N N  Preparing Food and eating ? N -  Using the Toilet? N -  In the past six months, have you accidently leaked urine? N -  Do you have problems with loss of bowel control? N -  Managing your Medications? N -  Managing your Finances? N -  Housekeeping or managing your Housekeeping? N -  Some recent data might be hidden    Patient Care Team: Vivi Barrack, MD as PCP - General (Family Medicine)  Indicate any recent Medical Services you may have received from other than [facility] providers in the past year (date may be approximate).     Assessment:   This is a routine wellness examination for [place].  Hearing/Vision screen Hearing Screening - Comments:: Pt wears hearing aids  Vision Screening - Comments:: Pt follows up with [facility] for annual eye exams   Dietary issues and exercise activities discussed: Current Exercise Habits: The patient does not participate in regular exercise at present   Goals Addressed             This Visit's Progress    Patient Stated       Lose another 20 lbs        Depression Screen PHQ 2/9 Scores 09/01/2021 06/27/2021 08/26/2020 05/26/2020 04/12/2020 03/12/2020  PHQ - 2 Score 0 0 0 0 0 0     Fall Risk Fall Risk  09/01/2021 06/27/2021 06/08/2021 08/26/2020 05/26/2020  Falls in the past year? 0 0 0 0 1  Number falls in past yr: 0 0 - 0 0  Injury with Fall? 0 0 - 0 1  Comment - - - - Fractured wrist in 2 places.  Risk for fall due to : Impaired vision No Fall Risks - Impaired vision -  Follow up Falls prevention discussed - - Falls prevention discussed -    FALL RISK PREVENTION PERTAINING TO THE HOME:  Any stairs in or around the home? Yes  If so, are there any without handrails? No  Home free of loose throw rugs in walkways, pet beds, electrical cords, etc? Yes  Adequate lighting in your home to reduce risk of falls? Yes   ASSISTIVE DEVICES UTILIZED TO PREVENT FALLS:  Life alert? No  Use of a cane, walker or w/c? No only as needed  Grab bars in the bathroom? Yes  Shower chair or bench in shower? No  Elevated toilet seat or a handicapped toilet? No   TIMED UP AND GO:  Was the test performed? No .  Cognitive Function:     6CIT Screen 09/01/2021 08/26/2020  What Year? 0 points 0 points  What month? 0 points 0 points  What time? 0 points -  Count back from 20 0 points 0 points  Months in reverse 0 points 0 points  Repeat phrase 0 points 0 points  Total Score 0 -    Immunizations Immunization History  Administered Date(s) Administered   Fluad Quad(high Dose 65+) 06/27/2021   Influenza, High Dose Seasonal PF 01/26/2019   Influenza-Unspecified 08/04/2020   PFIZER(Purple Top)SARS-COV-2 Vaccination 11/05/2019, 11/26/2019, 05/29/2020, 01/13/2021    TDAP status: Up to date pt stated   Flu Vaccine status: Up to date  Pneumococcal vaccine status: Up to date pt stated   Covid-19 vaccine status: Completed vaccines  Qualifies for Shingles Vaccine? Yes   Zostavax completed No   Shingrix Completed?: No.    Education has been provided regarding the importance of this vaccine. Patient has been advised to call insurance company to determine out of pocket expense if  they have not yet received this vaccine. Advised may also receive vaccine at local pharmacy or Health Dept. Verbalized acceptance and understanding.  Screening Tests Health Maintenance  Topic Date Due   Pneumonia Vaccine 2+ Years old (1 - PCV) Never done   TETANUS/TDAP  Never done   Zoster Vaccines- Shingrix (1 of 2) Never done   COVID-19 Vaccine (5 - Booster for [employer] series) 03/10/2021   INFLUENZA VACCINE  Completed   DEXA SCAN  Completed   Hepatitis C Screening  Completed   HPV VACCINES  Aged Out    Health Maintenance  Health Maintenance Due  Topic Date Due   Pneumonia Vaccine 50+ Years old (1 - PCV) Never done   TETANUS/TDAP  Never done   Zoster Vaccines- Shingrix (1 of 2) Never done   COVID-19 Vaccine (5 - Booster for [employer] series) 03/10/2021    Colorectal cancer screening: No longer required.   Mammogram status: Completed 05/26/20. Repeat every year  Bone Density status: Completed 05/26/20. Results reflect: Bone density results: OSTEOPENIA. Repeat every 2 years.   Additional Screening:  Hepatitis C Screening:  Completed 12/15/20  Vision Screening: Recommended annual ophthalmology exams for early detection of glaucoma and other disorders of the eye. Is the patient up to date with their annual eye exam?  Yes  Who is the provider or what is the name of the office in  which the patient attends annual eye exams? [facility] If pt is not established with a provider, would they like to be referred to a provider to establish care? No .   Dental Screening: Recommended annual dental exams for proper oral hygiene  Community Resource Referral / Chronic Care Management: CRR required this visit?  No   CCM required this visit?  No      Plan:     I have personally reviewed and noted the following in the patient's chart:   Medical and social history Use of alcohol, tobacco or illicit drugs  Current medications and supplements including opioid prescriptions.  Functional  ability and status Nutritional status Physical activity Advanced directives List of other physicians Hospitalizations, surgeries, and ER visits in previous 12 months Vitals Screenings to include cognitive, depression, and falls Referrals and appointments  In addition, I have reviewed and discussed with patient certain preventive protocols, quality metrics, and best practice recommendations. A written personalized care plan for preventive services as well as general preventive health recommendations were provided to patient.     Willette Brace, LPN   [id number]   Nurse Notes: none

## 2021-09-01 NOTE — Patient Instructions (Signed)
Denise Payne , Thank you for taking time to come for your Medicare Wellness Visit. I appreciate your ongoing commitment to your health goals. Please review the following plan we discussed and let me know if I can assist you in the future.   Screening recommendations/referrals: Colonoscopy: no longer required  Mammogram: Done 05/26/20 repeat every year  Bone Density: Done 05/26/20 repeat every 2 years  Recommended yearly ophthalmology/optometry visit for glaucoma screening and checkup Recommended yearly dental visit for hygiene and checkup  Vaccinations: Influenza vaccine: Done 06/27/21 Pneumococcal vaccine: Up to date per pt  Tdap vaccine: Up to date per pt  Shingles vaccine: Shingrix discussed. Please contact your pharmacy for coverage information.    Covid-19:Completed 1/20, 2/10, 05/29/20 & 3/311/22  Advanced directives: Please bring a copy of your health care power of attorney and living will to the office at your convenience.  Conditions/risks identified: Lose weight   Next appointment: Follow up in one year for your annual wellness visit    Preventive Care 65 Years and Older, Female Preventive care refers to lifestyle choices and visits with your health care provider that can promote health and wellness. What does preventive care include? A yearly physical exam. This is also called an annual well check. Dental exams once or twice a year. Routine eye exams. Ask your health care provider how often you should have your eyes checked. Personal lifestyle choices, including: Daily care of your teeth and gums. Regular physical activity. Eating a healthy diet. Avoiding tobacco and drug use. Limiting alcohol use. Practicing safe sex. Taking low-dose aspirin every day. Taking vitamin and mineral supplements as recommended by your health care provider. What happens during an annual well check? The services and screenings done by your health care provider during your annual well check  will depend on your age, overall health, lifestyle risk factors, and family history of disease. Counseling  Your health care provider may ask you questions about your: Alcohol use. Tobacco use. Drug use. Emotional well-being. Home and relationship well-being. Sexual activity. Eating habits. History of falls. Memory and ability to understand (cognition). Work and work Statistician. Reproductive health. Screening  You may have the following tests or measurements: Height, weight, and BMI. Blood pressure. Lipid and cholesterol levels. These may be checked every 5 years, or more frequently if you are over 41 years old. Skin check. Lung cancer screening. You may have this screening every year starting at age 27 if you have a 30-pack-year history of smoking and currently smoke or have quit within the past 15 years. Fecal occult blood test (FOBT) of the stool. You may have this test every year starting at age 14. Flexible sigmoidoscopy or colonoscopy. You may have a sigmoidoscopy every 5 years or a colonoscopy every 10 years starting at age 57. Hepatitis C blood test. Hepatitis B blood test. Sexually transmitted disease (STD) testing. Diabetes screening. This is done by checking your blood sugar (glucose) after you have not eaten for a while (fasting). You may have this done every 1-3 years. Bone density scan. This is done to screen for osteoporosis. You may have this done starting at age 57. Mammogram. This may be done every 1-2 years. Talk to your health care provider about how often you should have regular mammograms. Talk with your health care provider about your test results, treatment options, and if necessary, the need for more tests. Vaccines  Your health care provider may recommend certain vaccines, such as: Influenza vaccine. This is recommended every year. Tetanus,  diphtheria, and acellular pertussis (Tdap, Td) vaccine. You may need a Td booster every 10 years. Zoster vaccine. You  may need this after age 71. Pneumococcal 13-valent conjugate (PCV13) vaccine. One dose is recommended after age 4. Pneumococcal polysaccharide (PPSV23) vaccine. One dose is recommended after age 52. Talk to your health care provider about which screenings and vaccines you need and how often you need them. This information is not intended to replace advice given to you by your health care provider. Make sure you discuss any questions you have with your health care provider. Document Released: 10/29/2015 Document Revised: 06/21/2016 Document Reviewed: 08/03/2015 Elsevier Interactive Patient Education  2017 [place] Prevention in the Home Falls can cause injuries. They can happen to people of all ages. There are many things you can do to make your home safe and to help prevent falls. What can I do on the outside of my home? Regularly fix the edges of walkways and driveways and fix any cracks. Remove anything that might make you trip as you walk through a door, such as a raised step or threshold. Trim any bushes or trees on the path to your home. Use bright outdoor lighting. Clear any walking paths of anything that might make someone trip, such as rocks or tools. Regularly check to see if handrails are loose or broken. Make sure that both sides of any steps have handrails. Any raised decks and porches should have guardrails on the edges. Have any leaves, snow, or ice cleared regularly. Use sand or salt on walking paths during winter. Clean up any spills in your garage right away. This includes oil or grease spills. What can I do in the bathroom? Use night lights. Install grab bars by the toilet and in the tub and shower. Do not use towel bars as grab bars. Use non-skid mats or decals in the tub or shower. If you need to sit down in the shower, use a plastic, non-slip stool. Keep the floor dry. Clean up any water that spills on the floor as soon as it happens. Remove soap buildup  in the tub or shower regularly. Attach bath mats securely with double-sided non-slip rug tape. Do not have throw rugs and other things on the floor that can make you trip. What can I do in the bedroom? Use night lights. Make sure that you have a light by your bed that is easy to reach. Do not use any sheets or blankets that are too big for your bed. They should not hang down onto the floor. Have a firm chair that has side arms. You can use this for support while you get dressed. Do not have throw rugs and other things on the floor that can make you trip. What can I do in the kitchen? Clean up any spills right away. Avoid walking on wet floors. Keep items that you use a lot in easy-to-reach places. If you need to reach something above you, use a strong step stool that has a grab bar. Keep electrical cords out of the way. Do not use floor polish or wax that makes floors slippery. If you must use wax, use non-skid floor wax. Do not have throw rugs and other things on the floor that can make you trip. What can I do with my stairs? Do not leave any items on the stairs. Make sure that there are handrails on both sides of the stairs and use them. Fix handrails that are broken or loose. Make  sure that handrails are as long as the stairways. Check any carpeting to make sure that it is firmly attached to the stairs. Fix any carpet that is loose or worn. Avoid having throw rugs at the top or bottom of the stairs. If you do have throw rugs, attach them to the floor with carpet tape. Make sure that you have a light switch at the top of the stairs and the bottom of the stairs. If you do not have them, ask someone to add them for you. What else can I do to help prevent falls? Wear shoes that: Do not have high heels. Have rubber bottoms. Are comfortable and fit you well. Are closed at the toe. Do not wear sandals. If you use a stepladder: Make sure that it is fully opened. Do not climb a closed  stepladder. Make sure that both sides of the stepladder are locked into place. Ask someone to hold it for you, if possible. Clearly mark and make sure that you can see: Any grab bars or handrails. First and last steps. Where the edge of each step is. Use tools that help you move around (mobility aids) if they are needed. These include: Canes. Walkers. Scooters. Crutches. Turn on the lights when you go into a dark area. Replace any light bulbs as soon as they burn out. Set up your furniture so you have a clear path. Avoid moving your furniture around. If any of your floors are uneven, fix them. If there are any pets around you, be aware of where they are. Review your medicines with your doctor. Some medicines can make you feel dizzy. This can increase your chance of falling. Ask your doctor what other things that you can do to help prevent falls. This information is not intended to replace advice given to you by your health care provider. Make sure you discuss any questions you have with your health care provider. Document Released: 07/29/2009 Document Revised: 03/09/2016 Document Reviewed: 11/06/2014 Elsevier Interactive Patient Education  2017 [employer].

## 2021-09-12 DIAGNOSIS — Z6826 Body mass index (BMI) 26.0-26.9, adult: Secondary | ICD-10-CM | POA: Diagnosis not present

## 2021-09-12 DIAGNOSIS — M150 Primary generalized (osteo)arthritis: Secondary | ICD-10-CM | POA: Diagnosis not present

## 2021-09-12 DIAGNOSIS — E663 Overweight: Secondary | ICD-10-CM | POA: Diagnosis not present

## 2021-09-12 DIAGNOSIS — R5383 Other fatigue: Secondary | ICD-10-CM | POA: Diagnosis not present

## 2021-09-12 DIAGNOSIS — M25561 Pain in right knee: Secondary | ICD-10-CM | POA: Diagnosis not present

## 2021-09-12 DIAGNOSIS — M5136 Other intervertebral disc degeneration, lumbar region: Secondary | ICD-10-CM | POA: Diagnosis not present

## 2021-09-12 DIAGNOSIS — M3500 Sicca syndrome, unspecified: Secondary | ICD-10-CM | POA: Diagnosis not present

## 2021-09-14 DIAGNOSIS — Z23 Encounter for immunization: Secondary | ICD-10-CM | POA: Diagnosis not present

## 2021-11-11 DIAGNOSIS — H02132 Senile ectropion of right lower eyelid: Secondary | ICD-10-CM | POA: Diagnosis not present

## 2021-11-11 DIAGNOSIS — H02135 Senile ectropion of left lower eyelid: Secondary | ICD-10-CM | POA: Diagnosis not present

## 2021-12-01 DIAGNOSIS — H02132 Senile ectropion of right lower eyelid: Secondary | ICD-10-CM | POA: Diagnosis not present

## 2021-12-01 DIAGNOSIS — Z01818 Encounter for other preprocedural examination: Secondary | ICD-10-CM | POA: Diagnosis not present

## 2021-12-01 DIAGNOSIS — H02135 Senile ectropion of left lower eyelid: Secondary | ICD-10-CM | POA: Diagnosis not present

## 2021-12-03 DIAGNOSIS — Z20822 Contact with and (suspected) exposure to covid-19: Secondary | ICD-10-CM | POA: Diagnosis not present

## 2021-12-15 DIAGNOSIS — H02135 Senile ectropion of left lower eyelid: Secondary | ICD-10-CM | POA: Diagnosis not present

## 2021-12-15 DIAGNOSIS — H02132 Senile ectropion of right lower eyelid: Secondary | ICD-10-CM | POA: Diagnosis not present

## 2021-12-27 ENCOUNTER — Encounter: Payer: Self-pay | Admitting: Family Medicine

## 2021-12-27 ENCOUNTER — Ambulatory Visit (INDEPENDENT_AMBULATORY_CARE_PROVIDER_SITE_OTHER): Payer: Medicare Other | Admitting: Family Medicine

## 2021-12-27 VITALS — BP 112/80 | HR 67 | Temp 97.6°F | Ht 62.5 in | Wt 147.8 lb

## 2021-12-27 DIAGNOSIS — M3500 Sicca syndrome, unspecified: Secondary | ICD-10-CM | POA: Diagnosis not present

## 2021-12-27 DIAGNOSIS — Z23 Encounter for immunization: Secondary | ICD-10-CM

## 2021-12-27 DIAGNOSIS — I10 Essential (primary) hypertension: Secondary | ICD-10-CM | POA: Diagnosis not present

## 2021-12-27 DIAGNOSIS — K915 Postcholecystectomy syndrome: Secondary | ICD-10-CM

## 2021-12-27 DIAGNOSIS — R739 Hyperglycemia, unspecified: Secondary | ICD-10-CM | POA: Diagnosis not present

## 2021-12-27 DIAGNOSIS — J4520 Mild intermittent asthma, uncomplicated: Secondary | ICD-10-CM | POA: Diagnosis not present

## 2021-12-27 DIAGNOSIS — I700 Atherosclerosis of aorta: Secondary | ICD-10-CM

## 2021-12-27 DIAGNOSIS — M8580 Other specified disorders of bone density and structure, unspecified site: Secondary | ICD-10-CM

## 2021-12-27 DIAGNOSIS — K219 Gastro-esophageal reflux disease without esophagitis: Secondary | ICD-10-CM

## 2021-12-27 LAB — HEMOGLOBIN A1C: Hgb A1c MFr Bld: 5.2 % (ref 4.6–6.5)

## 2021-12-27 LAB — COMPREHENSIVE METABOLIC PANEL
ALT: 17 U/L (ref 0–35)
AST: 18 U/L (ref 0–37)
Albumin: 4.2 g/dL (ref 3.5–5.2)
Alkaline Phosphatase: 67 U/L (ref 39–117)
BUN: 20 mg/dL (ref 6–23)
CO2: 25 mEq/L (ref 19–32)
Calcium: 9.3 mg/dL (ref 8.4–10.5)
Chloride: 104 mEq/L (ref 96–112)
Creatinine, Ser: 0.77 mg/dL (ref 0.40–1.20)
GFR: 74.85 mL/min (ref >60.00)
Glucose, Bld: 81 mg/dL (ref 70–99)
Potassium: 4.2 mEq/L (ref 3.5–5.1)
Sodium: 136 mEq/L (ref 135–145)
Total Bilirubin: 0.5 mg/dL (ref 0.2–1.2)
Total Protein: 6.4 g/dL (ref 6.0–8.3)

## 2021-12-27 LAB — CBC
HCT: 36 % (ref 36.0–46.0)
Hemoglobin: 12 g/dL (ref 12.0–15.0)
MCHC: 33.4 g/dL (ref 30.0–36.0)
MCV: 88.4 fl (ref 78.0–100.0)
Platelets: 228 10*3/uL (ref 150.0–400.0)
RBC: 4.07 Mil/uL (ref 3.87–5.11)
RDW: 13.7 % (ref 11.5–15.5)
WBC: 7.4 10*3/uL (ref 4.0–10.5)

## 2021-12-27 LAB — LIPID PANEL
Cholesterol: 150 mg/dL (ref 0–200)
HDL: 72.9 mg/dL (ref >39.00)
LDL Cholesterol: 54 mg/dL (ref 0–99)
NonHDL: 76.93
Total CHOL/HDL Ratio: 2
Triglycerides: 113 mg/dL (ref 0.0–149.0)
VLDL: 22.6 mg/dL (ref 0.0–40.0)

## 2021-12-27 LAB — TSH: TSH: 1.9 u[IU]/mL (ref 0.35–5.50)

## 2021-12-27 NOTE — Assessment & Plan Note (Signed)
Stable on Nexium 20 mg daily as needed. ?

## 2021-12-27 NOTE — Assessment & Plan Note (Addendum)
Uses cholestyramine as needed with meals.  She is still having intermittent issues with diarrhea.  We will add on fiber supplement.  It is possible she could have some exocrine pancreatic insufficiency secondary to her Sjogren's syndrome.  We discussed referral to GI however she will try fiber supplementation first.  If this continues to be an issue will need referral to GI. ?

## 2021-12-27 NOTE — Assessment & Plan Note (Signed)
Follows with dermatology.  She is on Plaquenil.  ?

## 2021-12-27 NOTE — Addendum Note (Signed)
Addended by: Agnes Lawrence on: 12/27/2021 12:04 PM ? ? Modules accepted: Orders ? ?

## 2021-12-27 NOTE — Assessment & Plan Note (Signed)
Stable on Singulair and albuterol as needed. ?

## 2021-12-27 NOTE — Progress Notes (Signed)
? ?Denise Payne is a 76 y.o. female who presents today for an office visit. ? ?Assessment/Plan:  ?New/Acute Problems: ?Travel Medicine ?Needs updated hepatitis vaccines.  She also needs typhoid and yellow fever vaccine.  Provided patient with travel clinics in the area to have appointment scheduled. ? ?Chronic Problems Addressed Today: ?Atherosclerosis of aorta ([place]) ?On crestor '10mg'$  daily.  ? ?Post-cholecystectomy syndrome ?Uses cholestyramine as needed with meals.  She is still having intermittent issues with diarrhea.  We will add on fiber supplement.  It is possible she could have some exocrine pancreatic insufficiency secondary to her Sjogren's syndrome.  We discussed referral to GI however she will try fiber supplementation first.  If this continues to be an issue will need referral to GI. ? ?Benign essential HTN ?At goal today on lisinopril 40 mg daily. ? ?Osteopenia ?Needs bone density scan later this year.  We can discuss at her next office visit. ? ?Sjogren's syndrome ([place]) ?Follows with dermatology.  She is on Plaquenil.  ? ?Asthma ?Stable on Singulair and albuterol as needed. ? ?GERD (gastroesophageal reflux disease) ?Stable on Nexium 20 mg daily as needed. ? ?Pneumovax given today.  ? ?  ?Subjective:  ?HPI: ? ?See A/p for status of chronic conditions.  ? ?She is here today for annual checkup.  She will be traveling to [place] in a few months and would like to make sure that she is up-to-date on all vaccines. ? ?She is also had issues with intermittent diarrhea.  This happens every few months.  Usually associate with increased gas production.  No fevers or chills.  No recent illnesses.  She does take cholestyramine as needed which helps some. ? ?PMH: ? ?The following were reviewed and entered/updated in epic: ?Past Medical History:  ?Diagnosis Date  ? Anemia   ? Asthma   ? Cataract   ? GERD (gastroesophageal reflux disease)   ? Osteoporosis   ? Sjogren's syndrome ([place]) 01/26/2019  ? ?Patient Active  Problem List  ? Diagnosis Date Noted  ? 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 Atherosclerosis of aorta ([place]) 12/15/2020  ? Pancreatic cyst 12/15/2020  ? Benign essential HTN 04/12/2020  ? GERD (gastroesophageal reflux disease) 03/12/2020  ? Osteopenia 03/12/2020  ? Asthma 01/26/2019  ? Sjogren's syndrome ([place]) 01/26/2019  ? ?Past Surgical History:  ?Procedure Laterality Date  ? ABDOMINAL HYSTERECTOMY  1996  ? ANKLE FRACTURE SURGERY  2005  ? AUGMENTATION MAMMAPLASTY Bilateral 10/16/1976  ? CHOLECYSTECTOMY N/A 12/07/2020  ? Procedure: LAPAROSCOPIC CHOLECYSTECTOMY WITH INTRAOPERATIVE CHOLANGIOGRAM;  Surgeon: Kinsinger, Arta Bruce, MD;  Location: [facility];  Service: General;  Laterality: N/A;  ? [place] N/A 05/22/2019  ? Procedure: CYSTOSCOPY WITH STENT PLACEMENT;  Surgeon: Irine Seal, MD;  Location: [facility];  Service: Urology;  Laterality: N/A;  ? FLEXIBLE SIGMOIDOSCOPY N/A 05/22/2019  ? Procedure: FLEXIBLE SIGMOIDOSCOPY;  Surgeon: Ileana Roup, MD;  Location: [facility];  Service: General;  Laterality: N/A;  ? XI ROBOTIC ASSISTED LOWER ANTERIOR RESECTION Bilateral 05/22/2019  ? Procedure: XI ROBOTIC ASSISTED SIGMOIDECTOMY;  Surgeon: Ileana Roup, MD;  Location: [facility];  Service: General;  Laterality: Bilateral;  ? ? ?Family History  ?Problem Relation Age of Onset  ? Breast cancer Mother 56  ? Hearing loss Mother   ? Heart disease Mother   ? High Cholesterol Mother   ? Hypertension Mother   ? Cancer Father   ? Hearing loss Father   ? Stroke Father   ? Transient  ischemic attack Father   ? Arthritis Maternal Grandmother   ? Cancer Maternal Grandmother   ? Early death Maternal Grandmother   ? Diabetes Maternal Grandfather   ? Early death Maternal Grandfather   ? Hearing loss Maternal Grandfather   ? Heart attack Maternal Grandfather   ? Heart disease Maternal Grandfather   ? Arthritis Paternal Grandmother   ? Early death Paternal  Grandmother   ? Hearing loss Paternal Grandmother   ? Hyperlipidemia Paternal Grandmother   ? Early death Paternal Grandfather   ? Kidney disease Paternal Grandfather   ? Cancer Sister   ? Hyperlipidemia Sister   ? Hypertension Sister   ? Birth defects Brother   ? Cancer Brother   ? Hyperlipidemia Brother   ? ? ?Medications- reviewed and updated ?Current Outpatient Medications  ?Medication Sig Dispense Refill  ? acetaminophen (TYLENOL) 500 MG tablet Take 1,000 mg by mouth every 6 (six) hours as needed for mild pain.    ? albuterol (PROVENTIL HFA;VENTOLIN HFA) 108 (90 Base) MCG/ACT inhaler Inhale 2 puffs into the lungs every 6 (six) hours as needed for wheezing or shortness of breath.    ? BIOTIN PO Take 1 tablet by mouth daily.    ? carboxymethylcellulose (REFRESH PLUS) 0.5 % SOLN 1 drop 3 (three) times daily as needed.    ? Cholecalciferol (VITAMIN D) 50 MCG (2000 UT) tablet Take 2,000 Units by mouth daily.    ? cholestyramine (QUESTRAN) 4 g packet Take 1 packet (4 g total) by mouth 3 (three) times daily with meals. 60 each 12  ? cyclobenzaprine (FLEXERIL) 10 MG tablet Take 1 tablet (10 mg total) by mouth 3 (three) times daily as needed for muscle spasms. 90 tablet 0  ? diclofenac sodium (VOLTAREN) 1 % GEL Apply 2 g topically 2 (two) times daily as needed (pain).     ? esomeprazole (NEXIUM) 40 MG capsule Take 1 capsule (40 mg total) by mouth daily. 90 capsule 3  ? Ginger, Zingiber officinalis, (GINGER PO) Take by mouth.    ? hydroxychloroquine (PLAQUENIL) 200 MG tablet Take 200 mg by mouth daily.    ? lisinopril (ZESTRIL) 40 MG tablet Take 1 tablet (40 mg total) by mouth daily. 90 tablet 3  ? Melatonin 10 MG TABS Take 10 mg by mouth at bedtime as needed (sleep).     ? montelukast (SINGULAIR) 10 MG tablet Take 1 tablet (10 mg total) by mouth at bedtime. 90 tablet 3  ? naproxen sodium (ALEVE) 220 MG tablet Take 440 mg by mouth daily as needed (pain).    ? Probiotic Product (PROBIOTIC PO) Take 1 tablet by mouth daily.     ? rosuvastatin (CRESTOR) 10 MG tablet Take 1 tablet (10 mg total) by mouth daily. 90 tablet 3  ? Turmeric (QC TUMERIC COMPLEX PO) Take by mouth.    ? vitamin B-12 (CYANOCOBALAMIN) 1000 MCG tablet Take 1,000 mcg by mouth daily.     ? ?No current facility-administered medications for this visit.  ? ? ?Allergies-reviewed and updated ?Allergies  ?Allergen Reactions  ? Latex Shortness Of Breath and Swelling  ?  Lip and eye swelling  ? Meperidine Hcl Other (See Comments)  ? Morphine And Related Other (See Comments)  ?  Makes her cramp in abdomen ?  ? Onion Diarrhea and Other (See Comments)  ?  Stomach cramps  ? ? ?Social History  ? ?Socioeconomic History  ? Marital status: Widowed  ?  Spouse name: Not on file  ?  Number of children: Not on file  ? Years of education: Not on file  ? Highest education level: Not on file  ?Occupational History  ? Occupation: retired  ?Tobacco Use  ? Smoking status: Never  ? Smokeless tobacco: Never  ?Vaping Use  ? Vaping Use: Never used  ?Substance and Sexual Activity  ? Alcohol use: Yes  ?  Alcohol/week: 8.0 standard drinks  ?  Types: 8 Glasses of wine per week  ?  Comment: 3 times daily  ? Drug use: Never  ? Sexual activity: Not Currently  ?Other Topics Concern  ? Not on file  ?Social History Narrative  ? Not on file  ? ?Social Determinants of Health  ? ?Financial Resource Strain: Low Risk   ? Difficulty of Paying Living Expenses: Not hard at all  ?Food Insecurity: No Food Insecurity  ? Worried About Charity fundraiser in the Last Year: Never true  ? Ran Out of Food in the Last Year: Never true  ?Transportation Needs: No Transportation Needs  ? Lack of Transportation (Medical): No  ? Lack of Transportation (Non-Medical): No  ?Physical Activity: Inactive  ? Days of Exercise per Week: 0 days  ? Minutes of Exercise per Session: 0 min  ?Stress: No Stress Concern Present  ? Feeling of Stress : Not at all  ?Social Connections: Moderately Isolated  ? Frequency of Communication with Friends and  Family: More than three times a week  ? Frequency of Social Gatherings with Friends and Family: More than three times a week  ? Attends Religious Services: More than 4 times per year  ? Active Member of [employer]

## 2021-12-27 NOTE — Assessment & Plan Note (Signed)
On crestor '10mg'$  daily.  ?

## 2021-12-27 NOTE — Assessment & Plan Note (Signed)
At goal today on lisinopril 40 mg daily. ?

## 2021-12-27 NOTE — Patient Instructions (Signed)
It was very nice to see you today! ? ?We will check blood work today. ? ?We will give your pneumonia vaccine today. ? ?Please let me know if the diarrhea continues to be an issue.  Please try taking a fiber supplement. ? ?Please see the below website to schedule an appointment to discuss vaccines for travel: ? ?[email or web address] ? ?[email or web address] ? ?I will see you back in 6 months. Come back sooner if needed.  ? ?Take care, ?Dr Jerline Pain ? ?PLEASE NOTE: ? ?If you had any lab tests please let us know if you have not heard back within a few days. You may see your results on mychart before we have a chance to review them but we will give you a call once they are reviewed by [place]. If we ordered any referrals today, please let us know if you have not heard from their office within the next week.  ? ?Please try these tips to maintain a healthy lifestyle: ? ?Eat at least 3 REAL meals and 1-2 snacks per day.  Aim for no more than 5 hours between eating.  If you eat breakfast, please do so within one hour of getting up.  ? ?Each meal should contain half fruits/vegetables, one quarter protein, and one quarter carbs (no bigger than a computer mouse) ? ?Cut down on sweet beverages. This includes juice, soda, and sweet tea.  ? ?Drink at least 1 glass of water with each meal and aim for at least 8 glasses per day ? ?Exercise at least 150 minutes every week.   ? ?Preventive Care 11 Years and Older, Female ?Preventive care refers to lifestyle choices and visits with your health care provider that can promote health and wellness. Preventive care visits are also called wellness exams. ?What can I expect for my preventive care visit? ?Counseling ?Your health care provider may ask you questions about your: ?Medical history, including: ?Past medical problems. ?Family medical history. ?Pregnancy and menstrual  history. ?History of falls. ?Current health, including: ?Memory and ability to understand (cognition). ?Emotional well-being. ?Home life and relationship well-being. ?Sexual activity and sexual health. ?Lifestyle, including: ?Alcohol, nicotine or tobacco, and drug use. ?Access to firearms. ?Diet, exercise, and sleep habits. ?Work and work Statistician. ?Sunscreen use. ?Safety issues such as seatbelt and bike helmet use. ?Physical exam ?Your health care provider will check your: ?Height and weight. These may be used to calculate your BMI (body mass index). BMI is a measurement that tells if you are at a healthy weight. ?Waist circumference. This measures the distance around your waistline. This measurement also tells if you are at a healthy weight and may help predict your risk of certain diseases, such as type 2 diabetes and high blood pressure. ?Heart rate and blood pressure. ?Body temperature. ?Skin for abnormal spots. ?What immunizations do I need? ?Vaccines are usually given at various ages, according to a schedule. Your health care provider will recommend vaccines for you based on your age, medical history, and lifestyle or other factors, such as travel or where you work. ?What tests do I need? ?Screening ?Your health care provider may recommend screening tests for certain conditions. This may include: ?Lipid and cholesterol levels. ?Hepatitis C test. ?Hepatitis B test. ?HIV (human immunodeficiency virus) test. ?STI (sexually transmitted infection) testing, if you are at risk. ?Lung cancer screening. ?Colorectal cancer screening. ?Diabetes screening. This is done by checking your blood sugar (glucose) after you have not eaten for a while (fasting). ?Mammogram. Talk with your health care provider about  how often you should have regular mammograms. ?BRCA-related cancer screening. This may be done if you have a family history of breast, ovarian, tubal, or peritoneal cancers. ?Bone density scan. This is done to  screen for osteoporosis. ?Talk with your health care provider about your test results, treatment options, and if necessary, the need for more tests. ?Follow these instructions at home: ?Eating and drinking ? ?Eat a diet that includes fresh fruits and vegetables, whole grains, lean protein, and low-fat dairy products. Limit your intake of foods with high amounts of sugar, saturated fats, and salt. ?Take vitamin and mineral supplements as recommended by your health care provider. ?Do not drink alcohol if your health care provider tells you not to drink. ?If you drink alcohol: ?Limit how much you have to 0-1 drink a day. ?Know how much alcohol is in your drink. In the [place] one drink equals one 12 oz bottle of beer (355 mL), one 5 oz glass of wine (148 mL), or one 1? oz glass of hard liquor (44 mL). ?Lifestyle ?Brush your teeth every morning and night with fluoride toothpaste. Floss one time each day. ?Exercise for at least 30 minutes 5 or more days each week. ?Do not use any products that contain nicotine or tobacco. These products include cigarettes, chewing tobacco, and vaping devices, such as e-cigarettes. If you need help quitting, ask your health care provider. ?Do not use drugs. ?If you are sexually active, practice safe sex. Use a condom or other form of protection in order to prevent STIs. ?Take aspirin only as told by your health care provider. Make sure that you understand how much to take and what form to take. Work with your health care provider to find out whether it is safe and beneficial for you to take aspirin daily. ?Ask your health care provider if you need to take a cholesterol-lowering medicine (statin). ?Find healthy ways to manage stress, such as: ?Meditation, yoga, or listening to music. ?Journaling. ?Talking to a trusted person. ?Spending time with friends and family. ?Minimize exposure to UV radiation to reduce your risk of skin cancer. ?Safety ?Always wear your seat belt while driving or  riding in a vehicle. ?Do not drive: ?If you have been drinking alcohol. Do not ride with someone who has been drinking. ?When you are tired or distracted. ?While texting. ?If you have been using any mind-altering substances or drugs. ?Wear a helmet and other protective equipment during sports activities. ?If you have firearms in your house, make sure you follow all gun safety procedures. ?What's next? ?Visit your health care provider once a year for an annual wellness visit. ?Ask your health care provider how often you should have your eyes and teeth checked. ?Stay up to date on all vaccines. ?This information is not intended to replace advice given to you by your health care provider. Make sure you discuss any questions you have with your health care provider. ?Document Revised: 03/30/2021 Document Reviewed: 03/30/2021 ?Elsevier Patient Education ? [place]. ? ?

## 2021-12-27 NOTE — Assessment & Plan Note (Signed)
Needs bone density scan later this year.  We can discuss at her next office visit. ?

## 2021-12-28 NOTE — Progress Notes (Signed)
Please inform patient of the following: ? ?Good news! Her labs are all normal. Do not need to make any changes to her treatment plan at this time. We can recheck in a year. ? ?Algis Greenhouse. Jerline Pain, MD ?12/28/2021 9:15 AM  ?

## 2022-01-03 DIAGNOSIS — Z20822 Contact with and (suspected) exposure to covid-19: Secondary | ICD-10-CM | POA: Diagnosis not present

## 2022-01-25 ENCOUNTER — Emergency Department (HOSPITAL_COMMUNITY): Payer: Medicare Other

## 2022-01-25 ENCOUNTER — Inpatient Hospital Stay (HOSPITAL_COMMUNITY)
Admission: EM | Admit: 2022-01-25 | Discharge: 2022-01-27 | DRG: 310 | Disposition: A | Discharge status: Home / Self Care | Payer: Medicare Other | Attending: Cardiovascular Disease | Admitting: Cardiovascular Disease

## 2022-01-25 ENCOUNTER — Other Ambulatory Visit: Payer: Self-pay

## 2022-01-25 ENCOUNTER — Encounter (HOSPITAL_COMMUNITY): Payer: Self-pay

## 2022-01-25 DIAGNOSIS — Z91018 Allergy to other foods: Secondary | ICD-10-CM

## 2022-01-25 DIAGNOSIS — K219 Gastro-esophageal reflux disease without esophagitis: Secondary | ICD-10-CM | POA: Diagnosis present

## 2022-01-25 DIAGNOSIS — I441 Atrioventricular block, second degree: Secondary | ICD-10-CM | POA: Diagnosis present

## 2022-01-25 DIAGNOSIS — Z885 Allergy status to narcotic agent status: Secondary | ICD-10-CM

## 2022-01-25 DIAGNOSIS — Z91040 Latex allergy status: Secondary | ICD-10-CM | POA: Diagnosis not present

## 2022-01-25 DIAGNOSIS — J45909 Unspecified asthma, uncomplicated: Secondary | ICD-10-CM | POA: Diagnosis present

## 2022-01-25 DIAGNOSIS — I483 Typical atrial flutter: Secondary | ICD-10-CM | POA: Diagnosis not present

## 2022-01-25 DIAGNOSIS — Z79899 Other long term (current) drug therapy: Secondary | ICD-10-CM

## 2022-01-25 DIAGNOSIS — R0602 Shortness of breath: Secondary | ICD-10-CM | POA: Diagnosis not present

## 2022-01-25 DIAGNOSIS — I4891 Unspecified atrial fibrillation: Secondary | ICD-10-CM | POA: Diagnosis not present

## 2022-01-25 DIAGNOSIS — Z8249 Family history of ischemic heart disease and other diseases of the circulatory system: Secondary | ICD-10-CM | POA: Diagnosis not present

## 2022-01-25 DIAGNOSIS — R002 Palpitations: Secondary | ICD-10-CM | POA: Diagnosis not present

## 2022-01-25 DIAGNOSIS — M3500 Sicca syndrome, unspecified: Secondary | ICD-10-CM | POA: Diagnosis present

## 2022-01-25 DIAGNOSIS — Z888 Allergy status to other drugs, medicaments and biological substances status: Secondary | ICD-10-CM | POA: Diagnosis not present

## 2022-01-25 DIAGNOSIS — I4892 Unspecified atrial flutter: Secondary | ICD-10-CM | POA: Diagnosis not present

## 2022-01-25 DIAGNOSIS — I10 Essential (primary) hypertension: Secondary | ICD-10-CM | POA: Diagnosis present

## 2022-01-25 DIAGNOSIS — R000 Tachycardia, unspecified: Secondary | ICD-10-CM | POA: Diagnosis not present

## 2022-01-25 LAB — CBC WITH DIFFERENTIAL/PLATELET
Abs Immature Granulocytes: 0.02 10*3/uL (ref 0.00–0.07)
Basophils Absolute: 0.1 10*3/uL (ref 0.0–0.1)
Basophils Relative: 1 %
Eosinophils Absolute: 0.1 10*3/uL (ref 0.0–0.5)
Eosinophils Relative: 1 %
HCT: 38.5 % (ref 36.0–46.0)
Hemoglobin: 12.5 g/dL (ref 12.0–15.0)
Immature Granulocytes: 0 %
Lymphocytes Relative: 31 %
Lymphs Abs: 2.3 10*3/uL (ref 0.7–4.0)
MCH: 29.5 pg (ref 26.0–34.0)
MCHC: 32.5 g/dL (ref 30.0–36.0)
MCV: 90.8 fL (ref 80.0–100.0)
Monocytes Absolute: 0.5 10*3/uL (ref 0.1–1.0)
Monocytes Relative: 7 %
Neutro Abs: 4.4 10*3/uL (ref 1.7–7.7)
Neutrophils Relative %: 60 %
Platelets: 263 10*3/uL (ref 150–400)
RBC: 4.24 MIL/uL (ref 3.87–5.11)
RDW: 12.7 % (ref 11.5–15.5)
WBC: 7.4 10*3/uL (ref 4.0–10.5)
nRBC: 0 % (ref 0.0–0.2)

## 2022-01-25 LAB — COMPREHENSIVE METABOLIC PANEL
ALT: 12 U/L (ref 0–44)
AST: 15 U/L (ref 15–41)
Albumin: 3.6 g/dL (ref 3.5–5.0)
Alkaline Phosphatase: 62 U/L (ref 38–126)
Anion gap: 7 (ref 5–15)
BUN: 13 mg/dL (ref 8–23)
CO2: 21 mmol/L — ABNORMAL LOW (ref 22–32)
Calcium: 9.2 mg/dL (ref 8.9–10.3)
Chloride: 111 mmol/L (ref 98–111)
Creatinine, Ser: 0.76 mg/dL (ref 0.44–1.00)
GFR, Estimated: >60 mL/min (ref >60)
Glucose, Bld: 97 mg/dL (ref 70–99)
Potassium: 4.1 mmol/L (ref 3.5–5.1)
Sodium: 139 mmol/L (ref 135–145)
Total Bilirubin: 0.8 mg/dL (ref 0.3–1.2)
Total Protein: 6 g/dL — ABNORMAL LOW (ref 6.5–8.1)

## 2022-01-25 LAB — MAGNESIUM: Magnesium: 1.8 mg/dL (ref 1.7–2.4)

## 2022-01-25 IMAGING — DX DG CHEST 1V PORT
1 series · 1 of 1 positions shown · non-contrast
Comparison: None.

CLINICAL DATA: Shortness of breath.  Palpitations.

EXAM:
PORTABLE CHEST 1 VIEW

[chest]
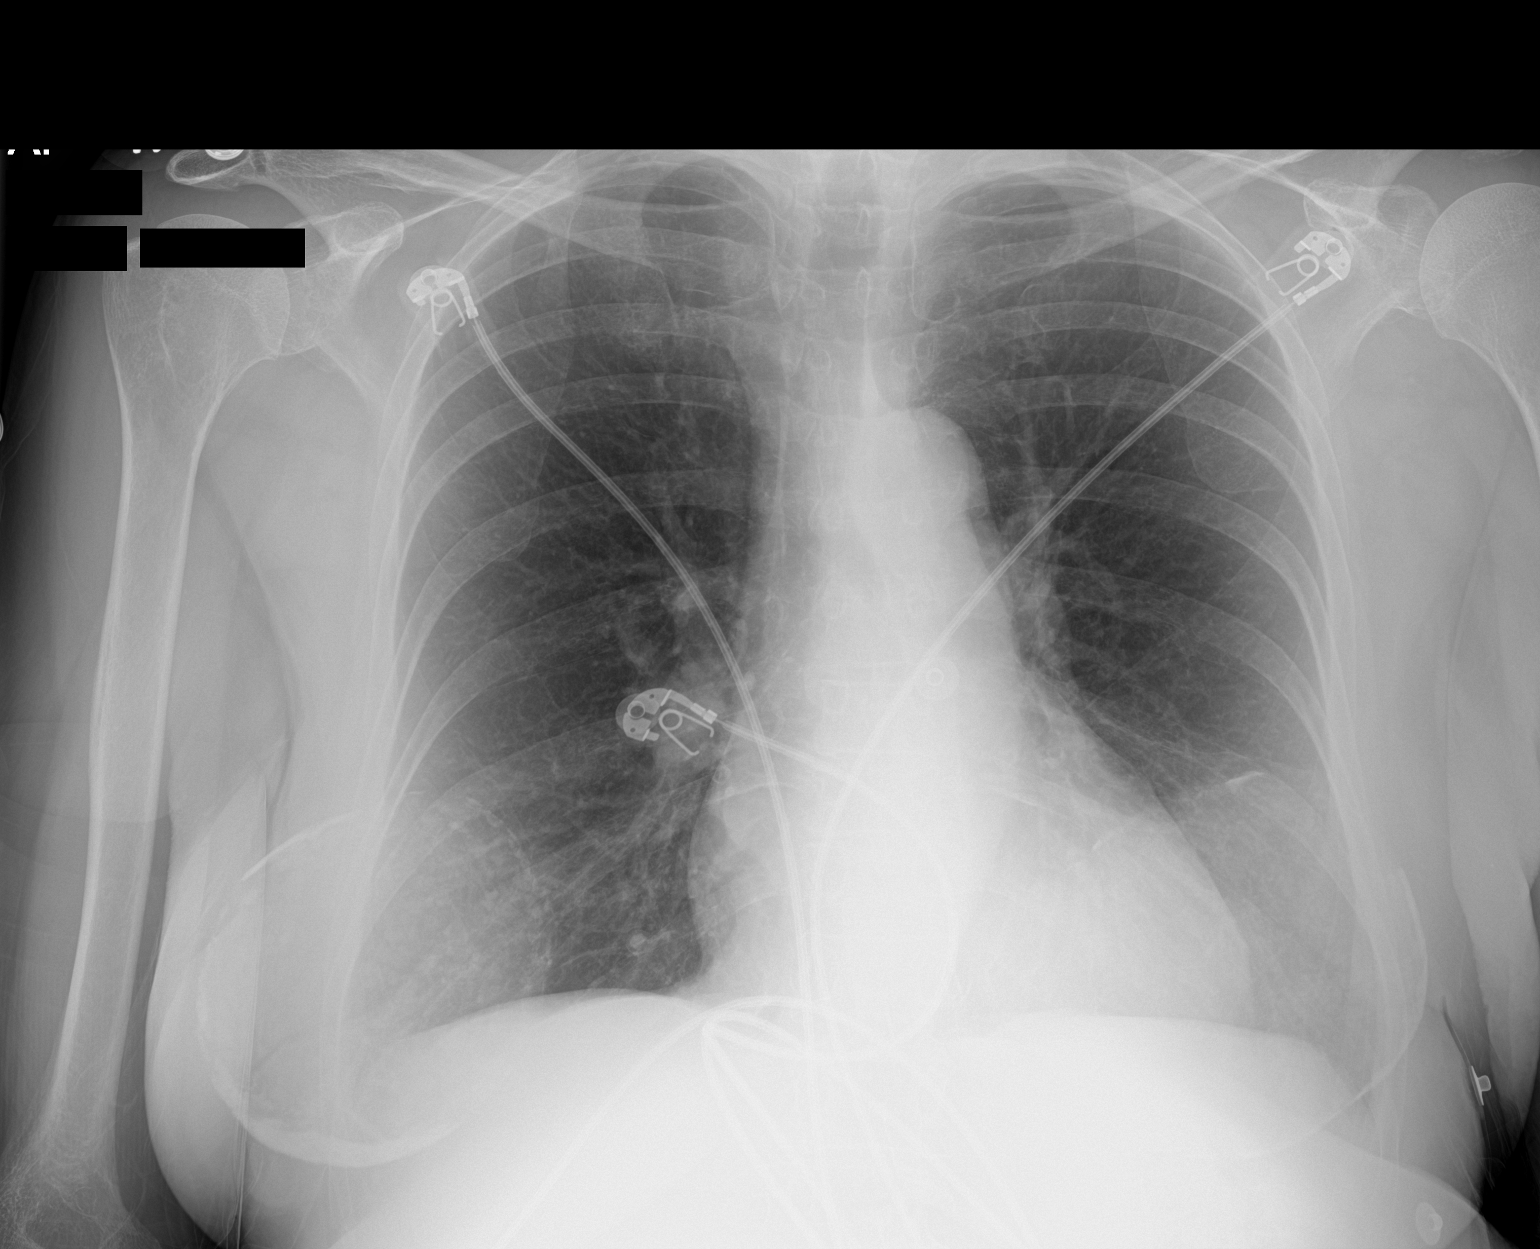

[1 of 1 positions shown; findings below may reference images not displayed]

FINDINGS: Normal cardiac silhouette and mediastinal contours with
atherosclerotic plaque within the thoracic aorta. No focal airspace
opacities. No pleural effusion or pneumothorax. No evidence of
edema. No acute osseous abnormalities. Peripherally calcified
bilateral breast prostheses.
IMPRESSION: No acute cardiopulmonary disease.

## 2022-01-25 MED ORDER — MONTELUKAST SODIUM 10 MG PO TABS
10.0000 mg | ORAL_TABLET | Freq: Every day | ORAL | Status: DC
  Administered 2022-01-25 – 2022-01-26 (×2): 10 mg via ORAL
  Filled 2022-01-25 (×2): qty 1

## 2022-01-25 MED ORDER — ACETAMINOPHEN 325 MG PO TABS
650.0000 mg | ORAL_TABLET | ORAL | Status: DC | PRN
  Administered 2022-01-26 (×2): 650 mg via ORAL
  Filled 2022-01-25 (×3): qty 2

## 2022-01-25 MED ORDER — POLYVINYL ALCOHOL 1.4 % OP SOLN
1.0000 [drp] | Freq: Two times a day (BID) | OPHTHALMIC | Status: DC | PRN
  Filled 2022-01-25: qty 15

## 2022-01-25 MED ORDER — BIOTIN 10000 MCG PO TABS: 10000.0000 ug | ORAL_TABLET | Freq: Every day | ORAL | Status: DC

## 2022-01-25 MED ORDER — NITROGLYCERIN 0.4 MG SL SUBL: 0.4000 mg | SUBLINGUAL_TABLET | SUBLINGUAL | Status: DC | PRN

## 2022-01-25 MED ORDER — HYDROXYCHLOROQUINE SULFATE 200 MG PO TABS
200.0000 mg | ORAL_TABLET | Freq: Every day | ORAL | Status: DC
  Administered 2022-01-26 – 2022-01-27 (×2): 200 mg via ORAL
  Filled 2022-01-25 (×3): qty 1

## 2022-01-25 MED ORDER — ALBUTEROL SULFATE HFA 108 (90 BASE) MCG/ACT IN AERS: 2.0000 | INHALATION_SPRAY | Freq: Four times a day (QID) | RESPIRATORY_TRACT | Status: DC | PRN

## 2022-01-25 MED ORDER — PANTOPRAZOLE SODIUM 40 MG PO TBEC
40.0000 mg | DELAYED_RELEASE_TABLET | Freq: Every day | ORAL | Status: DC
Start: 2022-01-26 — End: 2022-01-27
  Administered 2022-01-26 – 2022-01-27 (×2): 40 mg via ORAL
  Filled 2022-01-25 (×2): qty 1

## 2022-01-25 MED ORDER — CYCLOBENZAPRINE HCL 10 MG PO TABS
10.0000 mg | ORAL_TABLET | Freq: Three times a day (TID) | ORAL | Status: DC | PRN
  Administered 2022-01-26 (×2): 10 mg via ORAL
  Filled 2022-01-25 (×2): qty 1

## 2022-01-25 MED ORDER — ROSUVASTATIN CALCIUM 5 MG PO TABS
10.0000 mg | ORAL_TABLET | Freq: Every day | ORAL | Status: DC
  Administered 2022-01-26 – 2022-01-27 (×2): 10 mg via ORAL
  Filled 2022-01-25 (×2): qty 2

## 2022-01-25 MED ORDER — APIXABAN 5 MG PO TABS
5.0000 mg | ORAL_TABLET | Freq: Two times a day (BID) | ORAL | Status: DC
Start: 2022-01-25 — End: 2022-01-27
  Administered 2022-01-25 – 2022-01-27 (×4): 5 mg via ORAL
  Filled 2022-01-25 (×4): qty 1

## 2022-01-25 MED ORDER — ALBUTEROL SULFATE (2.5 MG/3ML) 0.083% IN NEBU: 2.5000 mg | INHALATION_SOLUTION | Freq: Four times a day (QID) | RESPIRATORY_TRACT | Status: DC | PRN

## 2022-01-25 MED ORDER — VITAMIN B-12 1000 MCG PO TABS
1000.0000 ug | ORAL_TABLET | Freq: Every day | ORAL | Status: DC
  Administered 2022-01-26 – 2022-01-27 (×2): 1000 ug via ORAL
  Filled 2022-01-25 (×2): qty 1

## 2022-01-25 MED ORDER — MELATONIN 5 MG PO TABS
10.0000 mg | ORAL_TABLET | Freq: Every evening | ORAL | Status: DC | PRN
  Administered 2022-01-25 – 2022-01-26 (×2): 10 mg via ORAL
  Filled 2022-01-25 (×2): qty 2

## 2022-01-25 MED ORDER — DILTIAZEM HCL-DEXTROSE 125-5 MG/125ML-% IV SOLN (PREMIX)
5.0000 mg/h | INTRAVENOUS | Status: DC
  Administered 2022-01-25: 5 mg/h via INTRAVENOUS
  Administered 2022-01-26 – 2022-01-27 (×3): 10 mg/h via INTRAVENOUS
  Filled 2022-01-25 (×4): qty 125

## 2022-01-25 MED ORDER — VITAMIN D3 25 MCG (1000 UNIT) PO TABS
2000.0000 [IU] | ORAL_TABLET | Freq: Every day | ORAL | Status: DC
  Administered 2022-01-26 – 2022-01-27 (×2): 2000 [IU] via ORAL
  Filled 2022-01-25 (×4): qty 2

## 2022-01-25 MED ORDER — ONDANSETRON HCL 4 MG/2ML IJ SOLN: 4.0000 mg | Freq: Four times a day (QID) | INTRAMUSCULAR | Status: DC | PRN

## 2022-01-25 MED ORDER — DILTIAZEM LOAD VIA INFUSION
10.0000 mg | Freq: Once | INTRAVENOUS | Status: AC
  Administered 2022-01-25: 10 mg via INTRAVENOUS
  Filled 2022-01-25: qty 10

## 2022-01-25 NOTE — Discharge Instructions (Signed)

## 2022-01-25 NOTE — ED Provider Notes (Signed)
4:15 PM-checkout from Dr. Maryan Rued to evaluate patient after completion of initial treatment and initiation of Cardizem therapy to control A-fib with RVR. ? ?5:38 PM-labs returned and are reassuring no acute abnormalities of CBC or metabolic panel.  Also magnesium normal.  Heart rate irregular, A-fib, 112 on the heart monitor with normal blood pressure.  Cardizem infusion running. ? ?MDM-A-fib with RVR, unknown time of onset, patient was asymptomatic when it was found earlier today when she presented from blepharoplasty.  Unable to specify started therefore she cannot be cardioverted.  We will consult cardiology for consultation and likely admit to hospitalist service.  Patient does not have known cardiac history.  She has a history of Sjogren's and occasional asthma. ? ? EKG Interpretation ? ?Date/Time:  Wednesday January 25 2022 16:10:29 EDT ?Ventricular Rate:  111 ?PR Interval:    ?QRS Duration: 113 ?QT Interval:  353 ?QTC Calculation: 418 ?R Axis:   -51 ?Text Interpretation: Atrial fibrillation LAD, consider left anterior fascicular block Since last tracing of earlier today No significant change was found Confirmed by Daleen Bo [phone]) on 01/25/2022 6:05:20 PM ?  ? ?  ? ?7:15 PM-consultation cardiology complete, Dr. Sallyanne Kuster will evaluate the patient.  He will admit the patient. ? ?  ?Daleen Bo, MD ?01/25/22 1943 ? ?

## 2022-01-25 NOTE — H&P (Addendum)
Cardiology Admission History and Physical:   Patient ID: Denise Payne MRN: [id number]; DOB: 07/29/1946   Admission date: 01/25/2022  PCP:  Ardith Dark, MD   [facility] Providers Cardiologist:  New   Chief Complaint:  afib   Patient Profile:   Denise Payne is a 76 y.o. female with hypertension, sjogren's syndrome, asthma and GERD who is being seen 01/25/2022 for the evaluation of atrial fibrillation with rapid ventricular rate.  History of Present Illness:   Denise Payne has no prior cardiac history.  She was scheduled to have blepharoplasty today and noted atrial fibrillation at heart rate of 150 and sent to ER for further evaluation.  She had palpitation last night prior to going to bed.  History of PVC which have caused palpitations in the past, but never this sustained.  She has mild orthostatic dizziness, which precedes the palpitations.  She denies syncope.  She has not had any troubles with exertional angina or dyspnea, orthopnea, PND, lower extremity edema, focal neurological events or intermittent claudication.  Initial rhythm is atrial flutter with 2: 1 AV block and a ventricular rate of 160 bpm.  After treatment with intravenous diltiazem she has variable AV block with ventricular rates on the average in the 110-120 bpm range.  She is currently completely unaware of the palpitations.  She has longstanding hypertension that has been well compensated with lisinopril but has no history of diabetes mellitus, congestive heart failure, coronary artery disease, stroke, TIA or valvular abnormalities.  No symptoms of hyperthyroidism.  TSH was normal less than a month ago.  Does not have a history of heavy snoring and denies daytime hypersomnolence.  She does drink alcohol, but in moderate amounts.  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  In the next few months she has 3 important graduations to  attend: Her son is receiving his doctorate and his 2 children are graduating from [employer].  She does not want to miss these important events.   Past Medical History:  Diagnosis Date   Anemia    Asthma    Cataract    GERD (gastroesophageal reflux disease)    Osteoporosis    Sjogren's syndrome (HCC) 01/26/2019    Past Surgical History:  Procedure Laterality Date   ABDOMINAL HYSTERECTOMY  1996   ANKLE FRACTURE SURGERY  2005   AUGMENTATION MAMMAPLASTY Bilateral 10/16/1976   CHOLECYSTECTOMY N/A 12/07/2020   Procedure: LAPAROSCOPIC CHOLECYSTECTOMY WITH INTRAOPERATIVE CHOLANGIOGRAM;  Surgeon: Sheliah Hatch De Blanch, MD;  Location: [facility] OR;  Service: General;  Laterality: N/A;   CYSTOSCOPY WITH STENT PLACEMENT N/A 05/22/2019   Procedure: CYSTOSCOPY WITH STENT PLACEMENT;  Surgeon: Bjorn Pippin, MD;  Location: [facility];  Service: Urology;  Laterality: N/A;   FLEXIBLE SIGMOIDOSCOPY N/A 05/22/2019   Procedure: FLEXIBLE SIGMOIDOSCOPY;  Surgeon: Andria Meuse, MD;  Location: [facility] ORS;  Service: General;  Laterality: N/A;   XI ROBOTIC ASSISTED LOWER ANTERIOR RESECTION Bilateral 05/22/2019   Procedure: XI ROBOTIC ASSISTED SIGMOIDECTOMY;  Surgeon: Andria Meuse, MD;  Location: [facility];  Service: General;  Laterality: Bilateral;     Medications Prior to Admission: Prior to Admission medications   Medication Sig Start Date End Date Taking? Authorizing Provider  acetaminophen (TYLENOL) 500 MG tablet Take 1,000 mg by mouth every 6 (six) hours as needed for mild pain.   Yes [provider]  albuterol (PROVENTIL HFA;VENTOLIN HFA) 108 (90 Base) MCG/ACT inhaler Inhale 2 puffs into the lungs  every 6 (six) hours as needed for wheezing or shortness of breath.   Yes [provider]  Biotin 10272 MCG TABS Take 10,000 mcg by mouth daily.   Yes [provider]  carboxymethylcellulose (REFRESH PLUS) 0.5 % SOLN Place 1 drop into both eyes 2 (two) times daily as needed (dry eyes).   Yes  [provider]  Cholecalciferol (VITAMIN D) 50 MCG (2000 UT) tablet Take 2,000 Units by mouth daily.   Yes [provider]  cholestyramine (QUESTRAN) 4 g packet Take 1 packet (4 g total) by mouth 3 (three) times daily with meals. Patient taking differently: Take 4 g by mouth 3 (three) times daily as needed (diarrhea). 06/27/21  Yes Ardith Dark, MD  cyclobenzaprine (FLEXERIL) 10 MG tablet Take 1 tablet (10 mg total) by mouth 3 (three) times daily as needed for muscle spasms. Patient taking differently: Take 10 mg by mouth daily as needed for muscle spasms. 03/12/20  Yes Orland Mustard, MD  diclofenac sodium (VOLTAREN) 1 % GEL Apply 2 g topically 2 (two) times daily as needed (pain).    Yes [provider]  esomeprazole (NEXIUM) 40 MG capsule Take 1 capsule (40 mg total) by mouth daily. 06/27/21  Yes Ardith Dark, MD  hydroxychloroquine (PLAQUENIL) 200 MG tablet Take 200 mg by mouth daily.   Yes [provider]  lisinopril (ZESTRIL) 40 MG tablet Take 1 tablet (40 mg total) by mouth daily. 06/27/21  Yes Ardith Dark, MD  Melatonin 10 MG TABS Take 10 mg by mouth at bedtime as needed (sleep).    Yes [provider]  METAMUCIL FIBER PO Take 1 Scoop by mouth daily.   Yes [provider]  montelukast (SINGULAIR) 10 MG tablet Take 1 tablet (10 mg total) by mouth at bedtime. 06/27/21  Yes Ardith Dark, MD  naproxen sodium (ALEVE) 220 MG tablet Take 440 mg by mouth daily as needed (pain).   Yes [provider]  Probiotic Product (PROBIOTIC PO) Take 1 tablet by mouth daily.   Yes [provider]  rosuvastatin (CRESTOR) 10 MG tablet Take 1 tablet (10 mg total) by mouth daily. 06/27/21  Yes Ardith Dark, MD  Turmeric (QC TUMERIC COMPLEX PO) Take by mouth.   Yes [provider]  vitamin B-12 (CYANOCOBALAMIN) 1000 MCG tablet Take 1,000 mcg by mouth daily.    Yes [provider]     Allergies:    Allergies   Allergen Reactions   Latex Shortness Of Breath and Swelling    Lip and eye swelling   Meperidine Hcl Other (See Comments)    diarrhea   Morphine And Related Other (See Comments)    Makes her cramp in abdomen    Onion Diarrhea and Other (See Comments)    Stomach cramps    Social History:   Social History   Socioeconomic History   Marital status: Widowed    Spouse name: Not on file   Number of children: Not on file   Years of education: Not on file   Highest education level: Not on file  Occupational History   Occupation: retired  Tobacco Use   Smoking status: Never   Smokeless tobacco: Never  Vaping Use   Vaping Use: Never used  Substance and Sexual Activity   Alcohol use: Yes    Alcohol/week: 8.0 standard drinks    Types: 8 Glasses of wine per week    Comment: 3 times daily   Drug use: Never  Sexual activity: Not Currently  Other Topics Concern   Not on file  Social History Narrative   Not on file   Social Determinants of Health   Financial Resource Strain: Low Risk    Difficulty of Paying Living Expenses: Not hard at all  Food Insecurity: No Food Insecurity   Worried About Running Out of Food in the Last Year: Never true   Ran Out of Food in the Last Year: Never true  Transportation Needs: No Transportation Needs   Lack of Transportation (Medical): No   Lack of Transportation (Non-Medical): No  Physical Activity: Inactive   Days of Exercise per Week: 0 days   Minutes of Exercise per Session: 0 min  Stress: No Stress Concern Present   Feeling of Stress : Not at all  Social Connections: Moderately Isolated   Frequency of Communication with Friends and Family: More than three times a week   Frequency of Social Gatherings with Friends and Family: More than three times a week   Attends Religious Services: More than 4 times per year   Active Member of [employer] or Organizations: No   Attends Banker Meetings: Never   Marital Status: Widowed   Catering manager Violence: Not At Risk   Fear of Current or Ex-Partner: No   Emotionally Abused: No   Physically Abused: No   Sexually Abused: No    Family History:   The patient's family history includes Arthritis in her maternal grandmother and paternal grandmother; Birth defects in her brother; Breast cancer (age of onset: 25) in her mother; Cancer in her brother, father, maternal grandmother, and sister; Diabetes in her maternal grandfather; Early death in her maternal grandfather, maternal grandmother, paternal grandfather, and paternal grandmother; Hearing loss in her father, maternal grandfather, mother, and paternal grandmother; Heart attack in her maternal grandfather; Heart disease in her maternal grandfather and mother; High Cholesterol in her mother; Hyperlipidemia in her brother, paternal grandmother, and sister; Hypertension in her mother and sister; Kidney disease in her paternal grandfather; Stroke in her father; Transient ischemic attack in her father.    ROS:  Please see the history of present illness.  All other ROS reviewed and negative.     Physical Exam/Data:   Vitals:   01/25/22 1800 01/25/22 1930 01/25/22 1940 01/25/22 1950  BP: 100/65 113/61 95/68 (!) 98/56  Pulse:  63 90 (!) 42  Resp: 18 20 18 16   Temp:      TempSrc:      SpO2:  97% 97% 98%  Weight:      Height:       No intake or output data in the 24 hours ending 01/25/22 2006    01/25/2022    5:51 PM 12/27/2021   11:01 AM 06/27/2021    2:34 PM  Last 3 Weights  Weight (lbs) 147 lb 11.3 oz 147 lb 12.8 oz 147 lb 6.4 oz  Weight (kg) 67 kg 67.042 kg 66.86 kg     Body mass index is 26.59 kg/m.  General:  Well nourished, well developed, in no acute distress appears younger than stated age HEENT: normal Neck: no JVD Vascular: No carotid bruits; Distal pulses 2+ bilaterally   Cardiac:  normal S1, S2; rapid irregular rhythm; no murmur  Lungs:  clear to auscultation bilaterally, no wheezing, rhonchi or  rales  Abd: soft, nontender, no hepatomegaly  Ext: no edema Musculoskeletal:  No deformities, BUE and BLE strength normal and equal Skin: warm and dry  Neuro:  CNs  2-12 intact, no focal abnormalities noted Psych:  Normal affect    EKG:  The ECG that was done  was personally reviewed and demonstrates atrial flutter with variable AV block and rapid ventricular response.  There are small positive flutter waves in lead V1 and a pattern of sawtooth flutter waves in the inferior leads, consistent with typical counterclockwise right atrial flutter.  There are borderline criteria for left anterior fascicular block.  Relevant CV Studies: No history of echocardiogram or other imaging studies of the heart.  Laboratory Data:  High Sensitivity Troponin:  No results for input(s): TROPONINIHS in the last 720 hours.    Chemistry Recent Labs  Lab 01/25/22 1603  NA 139  K 4.1  CL 111  CO2 21*  GLUCOSE 97  BUN 13  CREATININE 0.76  CALCIUM 9.2  MG 1.8  GFRNONAA >60  ANIONGAP 7    Recent Labs  Lab 01/25/22 1603  PROT 6.0*  ALBUMIN 3.6  AST 15  ALT 12  ALKPHOS 62  BILITOT 0.8   Lipids No results for input(s): CHOL, TRIG, HDL, LABVLDL, LDLCALC, CHOLHDL in the last 168 hours. Hematology Recent Labs  Lab 01/25/22 1603  WBC 7.4  RBC 4.24  HGB 12.5  HCT 38.5  MCV 90.8  MCH 29.5  MCHC 32.5  RDW 12.7  PLT 263   Thyroid No results for input(s): TSH, FREET4 in the last 168 hours. BNPNo results for input(s): BNP, PROBNP in the last 168 hours.  DDimer No results for input(s): DDIMER in the last 168 hours.   Radiology/Studies:  DG Chest Port 1 View  Result Date: 01/25/2022 CLINICAL DATA:  Shortness of breath.  Palpitations. EXAM: PORTABLE CHEST 1 VIEW COMPARISON:  None. FINDINGS: Normal cardiac silhouette and mediastinal contours with atherosclerotic plaque within the thoracic aorta. No focal airspace opacities. No pleural effusion or pneumothorax. No evidence of edema. No acute  osseous abnormalities. Peripherally calcified bilateral breast prostheses. IMPRESSION: No acute cardiopulmonary disease. Electronically Signed   By: Simonne Come M.D.   On: 01/25/2022 14:46     Assessment and Plan:   Atrial flutter: Time of onset is uncertain since her symptoms are very mild and inconsistent.  It would not be safe to proceed with cardioversion without first ascertaining that she does not have a left atrial thrombus.  Recommend initiating Eliquis now (CHA2DS2-VASc score 4 (age 2, gender, hypertension).  Initially will focus on rate control.  This may be challenging because of the flutter rhythm and her relatively low blood pressure.  After 3 doses of Eliquis and achieving steady state anticoagulation, we can consider TEE guided cardioversion (on Friday morning).  In the long run, I think she is an excellent candidate for cavotricuspid isthmus ablation unless we also identified atrial fibrillation or she has major structural cardiac abnormalities (unlikely based on history and physical exam). HTN: Recommend discontinuation of lisinopril and replacing it with a beta-blocker which will provide both blood pressure and rate control. Sjogren syndrome: Currently minimally symptomatic Planned blepharoplasty: If we proceed to cardioversion this will have to be delayed for at least 1 month.   Risk Assessment/Risk Scores:         CHA2DS2-VASc Score = 4   This indicates a 4.8% annual risk of stroke. The patient's score is based upon: CHF History: 0 HTN History: 1 Diabetes History: 0 Stroke History: 0 Vascular Disease History: 0 Age Score: 2 Gender Score: 1      Severity of Illness: The appropriate patient status for this  patient is INPATIENT. Inpatient status is judged to be reasonable and necessary in order to provide the required intensity of service to ensure the patient's safety. The patient's presenting symptoms, physical exam findings, and initial radiographic and laboratory  data in the context of their chronic comorbidities is felt to place them at high risk for further clinical deterioration. Furthermore, it is not anticipated that the patient will be medically stable for discharge from the hospital within 2 midnights of admission.   * I certify that at the point of admission it is my clinical judgment that the patient will require inpatient hospital care spanning beyond 2 midnights from the point of admission due to high intensity of service, high risk for further deterioration and high frequency of surveillance required.*   For questions or updates, please contact [facility] Please consult [email or web address] for contact info under     Signed, Thurmon Fair, MD  01/25/2022 8:06 PM

## 2022-01-25 NOTE — ED Triage Notes (Signed)
Pt BIB EMS due to afib RVR. Pt was supposed to have eyelid procedure and in pre op they found pt to be in afib at 150's. Pt does not have a hx of afib. VSS. axox4 ?

## 2022-01-25 NOTE — ED Provider Notes (Signed)
?[facility] ?Provider Note ? ? ?CSN: [id number] ?Arrival date & time: 01/25/22  1406 ? ?  ? ?History ? ?Chief Complaint  ?Patient presents with  ? Atrial Fibrillation  ? ? ?Denise Payne is a 76 y.o. female. ? ?Patient is a 76 year old female with a history of Sjogren syndrome, asthma, GERD and prior anemia who was going in for a blepharoplasty today and they noted her heart rate to be 150.  Patient reports that yesterday when she was getting into bed last night she felt like she was having palpitations.  However she was able to go to sleep and this morning noticed them again a little bit but did not have significant chest pain or shortness of breath.  However when she got to the office her heart rate was elevated and they sent her here for further evaluation.  She reports in the past she has had a history of PVCs which she has been unaware of but she has never had anything like this before.  She denies history of any anticoagulation use.  She has been n.p.o. since midnight because of the surgery but otherwise has been eating and drinking normally.  She denies any recent illness.  No recent medication changes.  She denies any over-the-counter stimulants. ? ?The history is provided by the patient.  ?Atrial Fibrillation ? ? ?  ? ?Home Medications ?Prior to Admission medications   ?Medication Sig Start Date End Date Taking? Authorizing Provider  ?acetaminophen (TYLENOL) 500 MG tablet Take 1,000 mg by mouth every 6 (six) hours as needed for mild pain.   Yes [provider]  ?albuterol (PROVENTIL HFA;VENTOLIN HFA) 108 (90 Base) MCG/ACT inhaler Inhale 2 puffs into the lungs every 6 (six) hours as needed for wheezing or shortness of breath.   Yes [provider]  ?Biotin 10000 MCG TABS Take 10,000 mcg by mouth daily.   Yes [provider]  ?carboxymethylcellulose (REFRESH PLUS) 0.5 % SOLN Place 1 drop into both eyes 2 (two) times daily as needed (dry eyes).    Yes [provider]  ?Cholecalciferol (VITAMIN D) 50 MCG (2000 UT) tablet Take 2,000 Units by mouth daily.   Yes [provider]  ?cholestyramine (QUESTRAN) 4 g packet Take 1 packet (4 g total) by mouth 3 (three) times daily with meals. ?Patient taking differently: Take 4 g by mouth 3 (three) times daily as needed (diarrhea). 06/27/21  Yes Vivi Barrack, MD  ?cyclobenzaprine (FLEXERIL) 10 MG tablet Take 1 tablet (10 mg total) by mouth 3 (three) times daily as needed for muscle spasms. ?Patient taking differently: Take 10 mg by mouth daily as needed for muscle spasms. 03/12/20  Yes Orma Flaming, MD  ?diclofenac sodium (VOLTAREN) 1 % GEL Apply 2 g topically 2 (two) times daily as needed (pain).    Yes [provider]  ?esomeprazole (NEXIUM) 40 MG capsule Take 1 capsule (40 mg total) by mouth daily. 06/27/21  Yes Vivi Barrack, MD  ?hydroxychloroquine (PLAQUENIL) 200 MG tablet Take 200 mg by mouth daily.   Yes [provider]  ?lisinopril (ZESTRIL) 40 MG tablet Take 1 tablet (40 mg total) by mouth daily. 06/27/21  Yes Vivi Barrack, MD  ?Melatonin 10 MG TABS Take 10 mg by mouth at bedtime as needed (sleep).    Yes [provider]  ?METAMUCIL FIBER PO Take 1 Scoop by mouth daily.   Yes [provider]  ?montelukast (SINGULAIR) 10 MG tablet Take 1 tablet (10  mg total) by mouth at bedtime. 06/27/21  Yes Vivi Barrack, MD  ?naproxen sodium (ALEVE) 220 MG tablet Take 440 mg by mouth daily as needed (pain).   Yes [provider]  ?Probiotic Product (PROBIOTIC PO) Take 1 tablet by mouth daily.   Yes [provider]  ?rosuvastatin (CRESTOR) 10 MG tablet Take 1 tablet (10 mg total) by mouth daily. 06/27/21  Yes Vivi Barrack, MD  ?Turmeric (QC TUMERIC COMPLEX PO) Take by mouth.   Yes [provider]  ?vitamin B-12 (CYANOCOBALAMIN) 1000 MCG tablet Take 1,000 mcg by mouth daily.    Yes [provider]  ?   ? ?Allergies    ?Latex,  Meperidine hcl, Morphine and related, and Onion   ? ?Review of Systems   ?Review of Systems ? ?Physical Exam ?Updated Vital Signs ?BP 98/79   Pulse (!) 158   Temp (!) 97.4 ?F (36.3 ?C) (Oral)   Resp 11   SpO2 99%  ?Physical Exam ?Vitals and nursing note reviewed.  ?Constitutional:   ?   General: She is not in acute distress. ?   Appearance: She is well-developed.  ?HENT:  ?   Head: Normocephalic and atraumatic.  ?Eyes:  ?   Conjunctiva/sclera: Conjunctivae normal.  ?   Pupils: Pupils are equal, round, and reactive to light.  ?Cardiovascular:  ?   Rate and Rhythm: Regular rhythm. Tachycardia present.  ?   Heart sounds: No murmur heard. ?Pulmonary:  ?   Effort: Pulmonary effort is normal. No respiratory distress.  ?   Breath sounds: Normal breath sounds. No wheezing or rales.  ?Abdominal:  ?   General: There is no distension.  ?   Palpations: Abdomen is soft.  ?   Tenderness: There is no abdominal tenderness. There is no guarding or rebound.  ?Musculoskeletal:     ?   General: No tenderness. Normal range of motion.  ?   Cervical back: Normal range of motion and neck supple.  ?Skin: ?   General: Skin is warm and dry.  ?   Findings: No erythema or rash.  ?Neurological:  ?   Mental Status: She is alert and oriented to person, place, and time.  ?Psychiatric:     ?   Mood and Affect: Mood normal.     ?   Behavior: Behavior normal.  ? ? ?ED Results / Procedures / Treatments   ?Labs ?(all labs ordered are listed, but only abnormal results are displayed) ?Labs Reviewed  ?CBC WITH DIFFERENTIAL/PLATELET  ?COMPREHENSIVE METABOLIC PANEL  ?MAGNESIUM  ? ? ?EKG ?EKG Interpretation ? ?Date/Time:  Wednesday January 25 2022 14:44:47 EDT ?Ventricular Rate:  129 ?PR Interval:    ?QRS Duration: 110 ?QT Interval:  320 ?QTC Calculation: 449 ?R Axis:   -43 ?Text Interpretation: new Atrial fibrillation Left axis deviation Confirmed by Blanchie Dessert [id number]) on 01/25/2022 2:51:55 PM ? ?Radiology ?DG Chest Port 1 View ? ?Result Date:  01/25/2022 ?CLINICAL DATA:  Shortness of breath.  Palpitations. EXAM: PORTABLE CHEST 1 VIEW COMPARISON:  None. FINDINGS: Normal cardiac silhouette and mediastinal contours with atherosclerotic plaque within the thoracic aorta. No focal airspace opacities. No pleural effusion or pneumothorax. No evidence of edema. No acute osseous abnormalities. Peripherally calcified bilateral breast prostheses. IMPRESSION: No acute cardiopulmonary disease. Electronically Signed   By: Sandi Mariscal M.D.   On: 01/25/2022 14:46   ? ?Procedures ?Procedures  ? ? ?Medications Ordered in ED ?Medications  ?diltiazem (CARDIZEM) 1 mg/mL load via infusion 10 mg (  has no administration in time range)  ?  And  ?diltiazem (CARDIZEM) 125 mg in dextrose 5% 125 mL (1 mg/mL) infusion (5 mg/hr Intravenous New Bag/Given 01/25/22 1606)  ? ? ?ED Course/ Medical Decision Making/ A&P ?  ?                        ?Medical Decision Making ?Amount and/or Complexity of Data Reviewed ?External Data Reviewed: notes. ?Labs: ordered. ?Radiology: ordered and independent interpretation performed. Decision-making details documented in ED Course. ?ECG/medicine tests: ordered and independent interpretation performed. Decision-making details documented in ED Course. ? ?Risk ?Prescription drug management. ?Drug therapy requiring intensive monitoring for toxicity. ? ? ?Patient is a well-appearing 76 year old female presenting today with tachycardia.  On exam patient is in no acute distress.  Her heart rate is elevated between 145-155.  Suspect atrial flutter with 2-1 conduction.  However will need an EKG to further evaluate.  Patient could possibly be a cardioversion candidate but she does not take any anticoagulation and will need to discuss it with cardiology.  Labs are pending to ensure no acute electrolyte abnormalities.  TSH done last month by PCP which was normal.  Once EKG has been evaluated will attempt heart rate control with either Cardizem or Lopressor. ? ?4:06  PM ?I independently interpreted patient's EKG which showed atrial fibrillation.  She has a CHA2DS2-VASc of 2 making her intermediate risk.  I independently visualized and interpreted patient's chest x-ray which shows

## 2022-01-25 NOTE — Progress Notes (Signed)
ANTICOAGULATION CONSULT NOTE - Initial Consult ? ?Pharmacy Consult for apixaban ?Indication: atrial fibrillation ? ?Allergies  ?Allergen Reactions  ? Latex Shortness Of Breath and Swelling  ?  Lip and eye swelling  ? Meperidine Hcl Other (See Comments)  ?  diarrhea  ? Morphine And Related Other (See Comments)  ?  Makes her cramp in abdomen ?  ? Onion Diarrhea and Other (See Comments)  ?  Stomach cramps  ? ? ?Patient Measurements: ?Height: 5' 2.5" (158.8 cm) ?Weight: 67 kg (147 lb 11.3 oz) ?IBW/kg (Calculated) : 51.25 ? ?Vital Signs: ?Temp: 97.4 ?F (36.3 ?C) (04/12 1411) ?Temp Source: Oral (04/12 1411) ?BP: 98/56 (04/12 1950) ?Pulse Rate: 42 (04/12 1950) ? ?Labs: ?Recent Labs  ?  01/25/22 ?1603  ?HGB 12.5  ?HCT 38.5  ?PLT 263  ?CREATININE 0.76  ? ? ?Estimated Creatinine Clearance: 54.4 mL/min (by C-G formula based on SCr of 0.76 mg/dL). ? ? ?Medical History: ?Past Medical History:  ?Diagnosis Date  ? Anemia   ? Asthma   ? Cataract   ? GERD (gastroesophageal reflux disease)   ? Osteoporosis   ? Sjogren's syndrome ([place]) 01/26/2019  ? ?Assessment: ?76 yo female with no prior cardiac history. Now found to have atrial fibrillation with rapid ventricular rate. Pharmacy consulted to start apixaban.  ? ?CBC within normal limits. Age <60 yo, Scr <1.5 mg/dl, weight >60kg so patient qualifies for apixaban 5 mg by mouth twice daily. ? ?Goal of Therapy:  ?Monitor platelets by anticoagulation protocol: Yes ?  ?Plan:  ?Start apixaban 5 mg PO twice daily  ?Monitor CBC ? ?Thank you for including pharmacy in the care of this patient. ? ?Zenaida Deed, PharmD ?PGY1 Acute Care Pharmacy Resident  ?Phone: [phone] ?01/25/2022  8:30 PM ? ?Please check [email or web address] for unit-specific pharmacy phone numbers. ? ? ?

## 2022-01-26 ENCOUNTER — Inpatient Hospital Stay (HOSPITAL_COMMUNITY): Payer: Medicare Other

## 2022-01-26 ENCOUNTER — Other Ambulatory Visit (HOSPITAL_COMMUNITY): Payer: Self-pay

## 2022-01-26 DIAGNOSIS — I4891 Unspecified atrial fibrillation: Secondary | ICD-10-CM

## 2022-01-26 LAB — BASIC METABOLIC PANEL
Anion gap: 5 (ref 5–15)
BUN: 14 mg/dL (ref 8–23)
CO2: 24 mmol/L (ref 22–32)
Calcium: 9.1 mg/dL (ref 8.9–10.3)
Chloride: 110 mmol/L (ref 98–111)
Creatinine, Ser: 0.76 mg/dL (ref 0.44–1.00)
GFR, Estimated: >60 mL/min (ref >60)
Glucose, Bld: 94 mg/dL (ref 70–99)
Potassium: 3.8 mmol/L (ref 3.5–5.1)
Sodium: 139 mmol/L (ref 135–145)

## 2022-01-26 LAB — CBC
HCT: 38.2 % (ref 36.0–46.0)
Hemoglobin: 12.4 g/dL (ref 12.0–15.0)
MCH: 29 pg (ref 26.0–34.0)
MCHC: 32.5 g/dL (ref 30.0–36.0)
MCV: 89.5 fL (ref 80.0–100.0)
Platelets: 244 10*3/uL (ref 150–400)
RBC: 4.27 MIL/uL (ref 3.87–5.11)
RDW: 12.9 % (ref 11.5–15.5)
WBC: 7.8 10*3/uL (ref 4.0–10.5)
nRBC: 0 % (ref 0.0–0.2)

## 2022-01-26 LAB — ECHOCARDIOGRAM COMPLETE
AR max vel: 2.71 cm2
AV Peak grad: 5.3 mmHg
Ao pk vel: 1.15 m/s
Height: 62.5 in
MV VTI: 1.6 cm2
S' Lateral: 2.8 cm
Weight: 2336 oz

## 2022-01-26 MED ORDER — SODIUM CHLORIDE 0.9 % IV SOLN: INTRAVENOUS | Status: DC

## 2022-01-26 NOTE — Discharge Summary (Addendum)
?Discharge Summary  ?  ?Patient ID: Denise Payne ?MRN: [id number]; DOB: September 07, 1946 ? ?Admit date: 01/25/2022 ?Discharge date: 01/27/2022 ? ?PCP:  Vivi Barrack, MD ?  ?[facility] Providers ?Cardiologist:  New to Dr. Sallyanne Kuster ? ? ?Discharge Diagnoses  ?  ?Atrial flutter with RVR ? ? ?Diagnostic Studies/Procedures  ?  ?Echo from 01/26/22: ? ? 1. Left ventricular ejection fraction, by estimation, is 60 to 65%. The  ?left ventricle has normal function. The left ventricle has no regional  ?wall motion abnormalities. Left ventricular diastolic parameters were  ?normal.  ? 2. Right ventricular systolic function is low normal. The right  ?ventricular size is normal. There is normal pulmonary artery systolic  ?pressure.  ? 3. The mitral valve is grossly normal. Mild mitral valve regurgitation.  ? 4. The aortic valve is normal in structure. Aortic valve regurgitation is  ?not visualized. No aortic stenosis is present.  ?_____________ ?  ?History of Present Illness   ?  ?Per admission H&P on 01/25/22: ? ?Denise Payne is a 76 y.o. female with hypertension, sjogren's syndrome, asthma and GERD who is being seen 01/25/2022 for the evaluation of atrial fibrillation with rapid ventricular rate. ? ?Denise Payne has no prior cardiac history.  She was scheduled to have blepharoplasty 4/12 and noted atrial fibrillation at heart rate of 150 and sent to ER for further evaluation.  She had palpitation the night before prior to going to bed.  History of PVC which have caused palpitations in the past, but never this sustained.  She had mild orthostatic dizziness, which precedes the palpitations.  She denied syncope.  She denied exertional angina or dyspnea, orthopnea, PND, lower extremity edema, focal neurological events or intermittent claudication. ?  ?Initial rhythm is atrial flutter with 2: 1 AV block and a ventricular rate of 160 bpm.  After treatment with intravenous diltiazem she has variable AV block with ventricular rates on  the average in the 110-120 bpm range.  She was completely unaware of the palpitations. ?  ?She has longstanding hypertension that has been well compensated with lisinopril but has no history of diabetes mellitus, congestive heart failure, coronary artery disease, stroke, TIA or valvular abnormalities. ?  ?No symptoms of hyperthyroidism.  TSH was normal less than a month ago.  Does not have a history of heavy snoring and denies daytime hypersomnolence.  She does drink alcohol, but in moderate amounts. ?  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 ?  ?In the next few months she has 3 important graduations to attend: Her son is receiving his doctorate and his 2 children are graduating from [employer].  She does not want to miss these important events. ? ? ?Hospital Course  ?   ?Consultants: N/A  ? ?Newly diagnosed atrial flutter/fibrillation with RVR ?-Presented with rapid heart rate and palpitation, mild orthostatic dizziness; Unclear onset ?-Echo with EF 60 to 65%, no regional wall motion abnormality, diastolic parameters normal, RV low normal, normal PASP, mild MR ?-CHA2DS2-VASc score is 4, initiated Eliquis '5mg'$  BID for anticoagulation ?-Initiated on diltiazem drip for rate control, rates was labile, blood pressure is soft limiting up titration of AV nodal agent, planned for TEE with DCCV, this is cancelled today as she had converted to SR now ?- will discharge home with regimen including: Cardizem CD '180mg'$  daily, and metoprolol '25mg'$  BID  ?- 2 weeks Zio monitor set up, staff to contact patient ?- will arrange EP  follow up, staff will reach out to the patient  ? ?Hypertension ?-Blood pressure labile, historically takes lisinopril '40mg'$ ,  held while on diltiazem drip due to soft BP, will discontinue at the time of discharge  ? ?Sjogren's syndrome ?Asthma  ?GERD ?-No acute issue, follow-up with PCP ? ? ?Did the patient have an acute coronary  syndrome (MI, NSTEMI, STEMI, etc) this admission?:  No                               ?Did the patient have a percutaneous coronary intervention (stent / angioplasty)?:  No.   ? ?   ? ?  ?_____________ ? ?Discharge Vitals ?Blood pressure (!) 109/53, pulse 67, temperature 97.7 ?F (36.5 ?C), temperature source Oral, resp. rate 17, height 5' 2.5" (1.588 m), weight 66.2 kg, SpO2 99 %.  ?Filed Weights  ? 01/25/22 1751 01/25/22 2158 01/26/22 0345  ?Weight: 67 kg 66.3 kg 66.2 kg  ? ?Alert and oriented x3 ?Lungs are CTA ?Cardiac RRR  No murmurs ?Ext are without edema   ? ?Labs & Radiologic Studies  ?  ?CBC ?Recent Labs  ?  01/25/22 ?1603 01/26/22 ?0128  ?WBC 7.4 7.8  ?NEUTROABS 4.4  --   ?HGB 12.5 12.4  ?HCT 38.5 38.2  ?MCV 90.8 89.5  ?PLT 263 244  ? ?Basic Metabolic Panel ?Recent Labs  ?  01/25/22 ?1603 01/26/22 ?0128  ?NA 139 139  ?K 4.1 3.8  ?CL 111 110  ?CO2 21* 24  ?GLUCOSE 97 94  ?BUN 13 14  ?CREATININE 0.76 0.76  ?CALCIUM 9.2 9.1  ?MG 1.8  --   ? ?Liver Function Tests ?Recent Labs  ?  01/25/22 ?1603  ?AST 15  ?ALT 12  ?ALKPHOS 62  ?BILITOT 0.8  ?PROT 6.0*  ?ALBUMIN 3.6  ? ?No results for input(s): LIPASE, AMYLASE in the last 72 hours. ?High Sensitivity Troponin:   ?No results for input(s): TROPONINIHS in the last 720 hours.  ?BNP ?Invalid input(s): POCBNP ?D-Dimer ?No results for input(s): DDIMER in the last 72 hours. ?Hemoglobin A1C ?No results for input(s): HGBA1C in the last 72 hours. ?Fasting Lipid Panel ?No results for input(s): CHOL, HDL, LDLCALC, TRIG, CHOLHDL, LDLDIRECT in the last 72 hours. ?Thyroid Function Tests ?No results for input(s): TSH, T4TOTAL, T3FREE, THYROIDAB in the last 72 hours. ? ?Invalid input(s): FREET3 ?_____________  ?DG Chest Port 1 View ? ?Result Date: 01/25/2022 ?CLINICAL DATA:  Shortness of breath.  Palpitations. EXAM: PORTABLE CHEST 1 VIEW COMPARISON:  None. FINDINGS: Normal cardiac silhouette and mediastinal contours with atherosclerotic plaque within the thoracic aorta. No focal  airspace opacities. No pleural effusion or pneumothorax. No evidence of edema. No acute osseous abnormalities. Peripherally calcified bilateral breast prostheses. IMPRESSION: No acute cardiopulmonary disease. Electronically Signed   By: Sandi Mariscal M.D.   On: 01/25/2022 14:46  ? ?ECHOCARDIOGRAM COMPLETE ? ?Result Date: 01/26/2022 ?   ECHOCARDIOGRAM REPORT   Patient Name:   Denise Payne Date of Exam: 01/26/2022 Medical Rec #:  [id number]       Height:       62.5 in Accession #:    [id number]      Weight:       146.0 lb Date of Birth:  August 28, 1946       BSA:          1.682 m? Patient Age:    76 years        BP:  111/55 mmHg Patient Gender: F               HR:           69 bpm. Exam Location:  Inpatient Procedure: 2D Echo Indications:    atrial flutter  History:        Patient has no prior history of Echocardiogram examinations.                 Abnormal ECG; Risk Factors:Hypertension.  Sonographer:    Johny Chess RDCS Referring Phys: [id number] [place]  1. Left ventricular ejection fraction, by estimation, is 60 to 65%. The left ventricle has normal function. The left ventricle has no regional wall motion abnormalities. Left ventricular diastolic parameters were normal.  2. Right ventricular systolic function is low normal. The right ventricular size is normal. There is normal pulmonary artery systolic pressure.  3. The mitral valve is grossly normal. Mild mitral valve regurgitation.  4. The aortic valve is normal in structure. Aortic valve regurgitation is not visualized. No aortic stenosis is present. FINDINGS  Left Ventricle: Left ventricular ejection fraction, by estimation, is 60 to 65%. The left ventricle has normal function. The left ventricle has no regional wall motion abnormalities. The left ventricular internal cavity size was normal in size. There is  no left ventricular hypertrophy. Left ventricular diastolic function could not be evaluated due to atrial fibrillation. Left  ventricular diastolic parameters were normal. Right Ventricle: The right ventricular size is normal. Right vetricular wall thickness was not well visualized. Right ventricular systolic function is low normal. The

## 2022-01-26 NOTE — TOC Benefit Eligibility Note (Signed)
Patient Advocate Encounter ? ?Insurance verification completed.   ? ?The patient is currently admitted and upon discharge could be taking Eliquis 5 mg. ? ?The current 30 day co-pay is, $38.00.  ? ?The patient is insured through [employer] ([facility] is the only [facility] that takes Tricare)  ? ? ? ?Liem Copenhaver, CPhT ?Pharmacy Patient Advocate Specialist ?[facility] Patient Advocate Team ?Direct Number: [phone]  Fax: [phone] ? ? ? ? ? ?  ?

## 2022-01-26 NOTE — Progress Notes (Signed)
? ?Progress Note ? ?Patient Name: Denise Payne ?Date of Encounter: 01/26/2022 ? ?[facility] Cardiologist: New ? ?Subjective  ? ?No CP  no dizziness  No SOB  ? ?Inpatient Medications  ?  ?Scheduled Meds: ? apixaban  5 mg Oral BID  ? cholecalciferol  2,000 Units Oral Daily  ? hydroxychloroquine  200 mg Oral Daily  ? montelukast  10 mg Oral QHS  ? pantoprazole  40 mg Oral Daily  ? rosuvastatin  10 mg Oral Daily  ? vitamin B-12  1,000 mcg Oral Daily  ? ?Continuous Infusions: ? diltiazem (CARDIZEM) infusion 10 mg/hr (01/26/22 0606)  ? ?PRN Meds: ?acetaminophen, albuterol, cyclobenzaprine, melatonin, nitroGLYCERIN, ondansetron (ZOFRAN) IV, polyvinyl alcohol  ? ?Vital Signs  ?  ?Vitals:  ? 01/26/22 0352 01/26/22 0741 01/26/22 0744 01/26/22 0856  ?BP: (!) 143/83 91/63  (!) 111/55  ?Pulse: 98 (!) 56 76 84  ?Resp:  17    ?Temp: 97.7 ?F (36.5 ?C) 97.9 ?F (36.6 ?C)    ?TempSrc: Oral Oral    ?SpO2: 100% 99% 100% 100%  ?Weight:      ?Height:      ? ?No intake or output data in the 24 hours ending 01/26/22 1022 ? ?  01/26/2022  ?  3:45 AM 01/25/2022  ?  9:58 PM 01/25/2022  ?  5:51 PM  ?Last 3 Weights  ?Weight (lbs) 146 lb 146 lb 3.2 oz 147 lb 11.3 oz  ?Weight (kg) 66.225 kg 66.316 kg 67 kg  ?   ? ?Telemetry  ?  ?Atrial flutter   80s to 120s   - Personally Reviewed ? ?ECG  ?  ?Atrial flutter with variable conduction   79 bpm  - Personally Reviewed ? ?Physical Exam  ? ?GEN: No acute distress.   ?Neck: No JVD ?Cardiac: Irreg irreg  No murmurs   ?Respiratory: Clear to auscultation bilaterally. ?GI: Soft, nontender, non-distended  ?MS: No edema; No deformity. ?Neuro:  Nonfocal  ?Psych: Normal affect  ? ?Labs  ?  ?High Sensitivity Troponin:  No results for input(s): TROPONINIHS in the last 720 hours.   ?Chemistry ?Recent Labs  ?Lab 01/25/22 ?1603 01/26/22 ?0128  ?NA 139 139  ?K 4.1 3.8  ?CL 111 110  ?CO2 21* 24  ?GLUCOSE 97 94  ?BUN 13 14  ?CREATININE 0.76 0.76  ?CALCIUM 9.2 9.1  ?MG 1.8  --   ?PROT 6.0*  --   ?ALBUMIN 3.6  --    ?AST 15  --   ?ALT 12  --   ?ALKPHOS 62  --   ?BILITOT 0.8  --   ?GFRNONAA >60 >60  ?ANIONGAP 7 5  ?  ?Lipids No results for input(s): CHOL, TRIG, HDL, LABVLDL, LDLCALC, CHOLHDL in the last 168 hours.  ?Hematology ?Recent Labs  ?Lab 01/25/22 ?1603 01/26/22 ?0128  ?WBC 7.4 7.8  ?RBC 4.24 4.27  ?HGB 12.5 12.4  ?HCT 38.5 38.2  ?MCV 90.8 89.5  ?MCH 29.5 29.0  ?MCHC 32.5 32.5  ?RDW 12.7 12.9  ?PLT 263 244  ? ?Thyroid No results for input(s): TSH, FREET4 in the last 168 hours.  ?BNPNo results for input(s): BNP, PROBNP in the last 168 hours.  ?DDimer No results for input(s): DDIMER in the last 168 hours.  ? ?Radiology  ?  ?DG Chest Port 1 View ? ?Result Date: 01/25/2022 ?CLINICAL DATA:  Shortness of breath.  Palpitations. EXAM: PORTABLE CHEST 1 VIEW COMPARISON:  None. FINDINGS: Normal cardiac silhouette and mediastinal contours with atherosclerotic plaque within the thoracic  aorta. No focal airspace opacities. No pleural effusion or pneumothorax. No evidence of edema. No acute osseous abnormalities. Peripherally calcified bilateral breast prostheses. IMPRESSION: No acute cardiopulmonary disease. Electronically Signed   By: Sandi Mariscal M.D.   On: 01/25/2022 14:46   ? ?Cardiac Studies  ? ?Echo just done  ? ?Patient Profile  ?  Denise Payne is a 76 y.o. female with hypertension, sjogren's syndrome, asthma and GERD who is being seen 01/25/2022 for the evaluation of atrial fibrillation with rapid ventricular rate. ? ? ?Assessment & Plan  ?  ?1  Atrial flutter   New   The pt does not really sense though has had some fatigue (also has Sjogrens syndrome which had had problems with recentlyand may explain) ?Rates are labile  Unfortunately BP has not allowed more aggressive rate control ? ?Echo just done ?Eliquis started yesterday ? ?Plan ?TEE and possible cardioversion tomorrow ?Risks and benefits described   Pt understands and agrees to proceed. ? ?Keep on diltiazem for now   Contnue Eliquis ? ?2.  HTN    Watch BP  ? ? ?For  questions or updates, please contact [facility] ?Please consult [email or web address] for contact info under  ? ?  ?   ?Signed, ?Dorris Carnes, MD  ?01/26/2022, 10:22 AM   ? ?

## 2022-01-26 NOTE — Progress Notes (Signed)
?  Echocardiogram ?2D Echocardiogram has been performed. ? ?Denise Payne ?01/26/2022, 10:47 AM ?

## 2022-01-27 ENCOUNTER — Other Ambulatory Visit: Payer: Self-pay | Admitting: Home Health

## 2022-01-27 ENCOUNTER — Other Ambulatory Visit (HOSPITAL_COMMUNITY): Payer: Medicare Other

## 2022-01-27 ENCOUNTER — Inpatient Hospital Stay (INDEPENDENT_AMBULATORY_CARE_PROVIDER_SITE_OTHER): Payer: Medicare Other

## 2022-01-27 DIAGNOSIS — I4892 Unspecified atrial flutter: Secondary | ICD-10-CM

## 2022-01-27 SURGERY — ECHOCARDIOGRAM, TRANSESOPHAGEAL
Anesthesia: Monitor Anesthesia Care

## 2022-01-27 MED ORDER — METOPROLOL TARTRATE 25 MG PO TABS
25.0000 mg | ORAL_TABLET | Freq: Two times a day (BID) | ORAL | Status: DC
  Administered 2022-01-27: 25 mg via ORAL
  Filled 2022-01-27: qty 1

## 2022-01-27 MED ORDER — APIXABAN 5 MG PO TABS: 5.0000 mg | ORAL_TABLET | Freq: Two times a day (BID) | ORAL | 2 refills | Status: DC

## 2022-01-27 MED ORDER — DILTIAZEM HCL ER COATED BEADS 180 MG PO CP24: 180.0000 mg | ORAL_CAPSULE | Freq: Every day | ORAL | 0 refills | Status: DC

## 2022-01-27 MED ORDER — METOPROLOL TARTRATE 25 MG PO TABS: 25.0000 mg | ORAL_TABLET | Freq: Two times a day (BID) | ORAL | 0 refills | Status: DC

## 2022-01-27 MED ORDER — METOPROLOL TARTRATE 25 MG PO TABS: 25.0000 mg | ORAL_TABLET | Freq: Two times a day (BID) | ORAL | 2 refills | Status: DC

## 2022-01-27 MED ORDER — DILTIAZEM HCL ER COATED BEADS 180 MG PO CP24
180.0000 mg | ORAL_CAPSULE | Freq: Every day | ORAL | Status: DC
  Administered 2022-01-27: 180 mg via ORAL
  Filled 2022-01-27: qty 1

## 2022-01-27 MED ORDER — DILTIAZEM HCL ER COATED BEADS 180 MG PO CP24: 180.0000 mg | ORAL_CAPSULE | Freq: Every day | ORAL | 2 refills | Status: DC

## 2022-01-27 MED ORDER — APIXABAN 5 MG PO TABS
5.0000 mg | ORAL_TABLET | Freq: Two times a day (BID) | ORAL | 0 refills | Status: DC
Start: 2022-01-27 — End: 2022-03-07

## 2022-01-27 NOTE — TOC Transition Note (Addendum)
Transition of Care (TOC) - CM/SW Discharge Note ? ? ?Patient Details  ?Name: Danahi Reddish ?MRN: [id number] ?Date of Birth: 1946/04/12 ? ?Transition of Care (TOC) CM/SW Contact:  ?Zenon Mayo, RN ?Phone Number: ?01/27/2022, 11:17 AM ? ? ?Clinical Narrative:    ?Patient is from home, indep, her son is here at the bedside and will be transporting her home today.  NCM gave her the 30 day free eliquis coupon to take to pharmacy and informed her that the refills will be 38.00 , she states it usually is 12.00 thru her [facility].  She has no other needs. Hospital follow up on AVS. ? ? ?  ?  ? ? ?Patient Goals and CMS Choice ?  ?  ?  ? ?Discharge Placement ?  ?           ?  ?  ?  ?  ? ?Discharge Plan and Services ?  ?  ?           ?  ?  ?  ?  ?  ?  ?  ?  ?  ?  ? ?Social Determinants of Health (SDOH) Interventions ?  ? ? ?Readmission Risk Interventions ?   ? View : No data to display.  ?  ?  ?  ? ? ? ? ? ?

## 2022-01-27 NOTE — Progress Notes (Signed)
Discharge instructions given. Patient verbalized understanding and all questions were answered.  ?

## 2022-01-27 NOTE — Progress Notes (Unsigned)
Enrolled for [employer] to mail a ZIO XT long term holter monitor to the patients address on file.  

## 2022-01-27 NOTE — Progress Notes (Signed)
Zio x2 weeks ordered per Dr [name]  ?

## 2022-01-30 ENCOUNTER — Telehealth: Payer: Self-pay

## 2022-01-30 DIAGNOSIS — I4892 Unspecified atrial flutter: Secondary | ICD-10-CM | POA: Diagnosis not present

## 2022-01-30 NOTE — Telephone Encounter (Signed)
Transition Care Management Follow-up Telephone Call ?Date of discharge and from where: [facility] 01/27/22 ?How have you been since you were released from the hospital? ok ?Any questions or concerns? No ? ?Items Reviewed: ?Did the pt receive and understand the discharge instructions provided? Yes  ?Medications obtained and verified? Yes  ?Other? No  ?Any new allergies since your discharge? No  ?Dietary orders reviewed? Yes ?Do you have support at home? Yes  ? ?Home Care and Equipment/Supplies: ?Were home health services ordered? not applicable ?If so, what is the name of the agency?   ?Has the agency set up a time to come to the patient's home? not applicable ?Were any new equipment or medical supplies ordered?  No ?What is the name of the medical supply agency?  ?Were you able to get the supplies/equipment? not applicable ?Do you have any questions related to the use of the equipment or supplies? No ? ?Functional Questionnaire: (I = Independent and D = Dependent) ?ADLs: I ? ?Bathing/Dressing- I ? ?Meal Prep- I ? ?Eating- I ? ?Maintaining continence- I ? ?Transferring/Ambulation- I ? ?Managing Meds- I ? ?Follow up appointments reviewed: ? ?PCP Hospital f/u appt confirmed? Yes  Scheduled to see Dr Jerline Pain  on 02/03/22 @ 9:20. ?[facility] f/u appt confirmed? No   ?Are transportation arrangements needed? No  ?If their condition worsens, is the pt aware to call PCP or go to the Emergency Dept.? Yes ?Was the patient provided with contact information for the PCP's office or ED? Yes ?Was to pt encouraged to call back with questions or concerns? Yes ? ?

## 2022-02-03 ENCOUNTER — Encounter: Payer: Self-pay | Admitting: Family Medicine

## 2022-02-03 ENCOUNTER — Ambulatory Visit (INDEPENDENT_AMBULATORY_CARE_PROVIDER_SITE_OTHER): Payer: Medicare Other | Admitting: Family Medicine

## 2022-02-03 VITALS — BP 125/82 | HR 57 | Temp 97.7°F | Ht 62.5 in | Wt 144.6 lb

## 2022-02-03 DIAGNOSIS — M542 Cervicalgia: Secondary | ICD-10-CM

## 2022-02-03 DIAGNOSIS — I10 Essential (primary) hypertension: Secondary | ICD-10-CM | POA: Diagnosis not present

## 2022-02-03 DIAGNOSIS — Z20822 Contact with and (suspected) exposure to covid-19: Secondary | ICD-10-CM | POA: Diagnosis not present

## 2022-02-03 DIAGNOSIS — I4892 Unspecified atrial flutter: Secondary | ICD-10-CM | POA: Diagnosis not present

## 2022-02-03 MED ORDER — CYCLOBENZAPRINE HCL 10 MG PO TABS: 10.0000 mg | ORAL_TABLET | Freq: Three times a day (TID) | ORAL | 0 refills | Status: DC | PRN

## 2022-02-03 NOTE — Progress Notes (Signed)
? ? ?Chief Complaint:  ?Denise Payne is a 76 y.o. female who presents today for a TCM visit. ? ?Assessment/Plan:  ?Chronic Problems Addressed Today: ?Atrial flutter ([place]) ?Appears to be in sinus rhythm today.  We will continue management per cardiology.  She currently has Zio patch in place.  She is on diltiazem 180 mg daily, metoprolol tartrate 25 mg twice daily, and anticoagulant Eliquis 5 mg twice daily.  She will follow-up with cardiology in a few weeks. ? ?Benign essential HTN ?Blood pressure at goal today on diltiazem 180 mg daily and metoprolol tartrate 25 mg twice daily. ? ?Neck pain ?No red flags.  Flexeril refilled today. ? ?  ?Subjective:  ?HPI: ? ?Summary of Hospital admission: ?Reason for admission: Afib with RVR ?Date of admission: 01/25/2022 ?Date of discharge: 01/27/2022 ?Date of Interactive contact: 01/30/2022 ?Summary of Hospital course: Patient presented to the ED on 01/25/2022 after being found to be in atrial fibrillation as a preop evaluation for blepharoplasty.  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  In the ED she was found to be in atrial flutter with a 2:1 AV block and ventricular rate of 160.  She was admitted for management.  Given IV diltiazem with improvement in heart rate.  Had cardiac work-up including echocardiogram which was essentially normal.  She converted to NSR while hospitalized.  She was discharged home on regimen of Cardizem 180 mg daily and metoprolol 25 mg twice daily. ? ?Interim history:  ?Since being home she has been doing well.  She has been compliant with her medications without any missed doses.  She is looking forward to getting off the Eliquis eventually.  She will be following up with cardiology in a few weeks.  No chest pain or shortness of breath.  She is having a bit of fatigue in the afternoons.  This has been manageable.  No reported bleeding issues. ? ?See A/p for status of chronic  conditions.  ? ?ROS: Per HPI, otherwise a complete review of systems was negative.  ? ?PMH: ? ?The following were reviewed and entered/updated in epic: ?Past Medical History:  ?Diagnosis Date  ? Anemia   ? Asthma   ? Cataract   ? GERD (gastroesophageal reflux disease)   ? Osteoporosis   ? Sjogren's syndrome ([place]) 01/26/2019  ? ?Patient Active Problem List  ? Diagnosis Date Noted  ? Neck pain 02/03/2022  ? Atrial flutter ([place]) 01/27/2022  ? 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 Atherosclerosis of aorta ([place]) 12/15/2020  ? Pancreatic cyst 12/15/2020  ? Benign essential HTN 04/12/2020  ? GERD (gastroesophageal reflux disease) 03/12/2020  ? Osteopenia 03/12/2020  ? Asthma 01/26/2019  ? Sjogren's syndrome ([place]) 01/26/2019  ? ?Past Surgical History:  ?Procedure Laterality Date  ? ABDOMINAL HYSTERECTOMY  1996  ? ANKLE FRACTURE SURGERY  2005  ? AUGMENTATION MAMMAPLASTY Bilateral 10/16/1976  ? CHOLECYSTECTOMY N/A 12/07/2020  ? Procedure: LAPAROSCOPIC CHOLECYSTECTOMY WITH INTRAOPERATIVE CHOLANGIOGRAM;  Surgeon: Kinsinger, Arta Bruce, MD;  Location: [facility];  Service: General;  Laterality: N/A;  ? [place] N/A 05/22/2019  ? Procedure: CYSTOSCOPY WITH STENT PLACEMENT;  Surgeon: Irine Seal, MD;  Location: [facility];  Service: Urology;  Laterality: N/A;  ? FLEXIBLE SIGMOIDOSCOPY N/A 05/22/2019  ? Procedure: FLEXIBLE SIGMOIDOSCOPY;  Surgeon: Ileana Roup, MD;  Location: [facility];  Service: General;  Laterality: N/A;  ? XI ROBOTIC ASSISTED LOWER ANTERIOR RESECTION Bilateral 05/22/2019  ?  Procedure: XI ROBOTIC ASSISTED SIGMOIDECTOMY;  Surgeon: Ileana Roup, MD;  Location: [facility];  Service: General;  Laterality: Bilateral;  ? ? ?Family History  ?Problem Relation Age of Onset  ? Breast cancer Mother 62  ? Hearing loss Mother   ? Heart disease Mother   ? High Cholesterol Mother   ? Hypertension Mother   ? Cancer Father   ? Hearing loss Father   ? Stroke  Father   ? Transient ischemic attack Father   ? Arthritis Maternal Grandmother   ? Cancer Maternal Grandmother   ? Early death Maternal Grandmother   ? Diabetes Maternal Grandfather   ? Early death Maternal Grandfather   ? Hearing loss Maternal Grandfather   ? Heart attack Maternal Grandfather   ? Heart disease Maternal Grandfather   ? Arthritis Paternal Grandmother   ? Early death Paternal Grandmother   ? Hearing loss Paternal Grandmother   ? Hyperlipidemia Paternal Grandmother   ? Early death Paternal Grandfather   ? Kidney disease Paternal Grandfather   ? Cancer Sister   ? Hyperlipidemia Sister   ? Hypertension Sister   ? Birth defects Brother   ? Cancer Brother   ? Hyperlipidemia Brother   ? ? ?Medications- Reconciled discharge and current medications in Epic.  ?Current Outpatient Medications  ?Medication Sig Dispense Refill  ? acetaminophen (TYLENOL) 500 MG tablet Take 1,000 mg by mouth every 6 (six) hours as needed for mild pain.    ? albuterol (PROVENTIL HFA;VENTOLIN HFA) 108 (90 Base) MCG/ACT inhaler Inhale 2 puffs into the lungs every 6 (six) hours as needed for wheezing or shortness of breath.    ? apixaban (ELIQUIS) 5 MG TABS tablet Take 1 tablet (5 mg total) by mouth 2 (two) times daily. 60 tablet 0  ? Biotin 10000 MCG TABS Take 10,000 mcg by mouth daily.    ? carboxymethylcellulose (REFRESH PLUS) 0.5 % SOLN Place 1 drop into both eyes 2 (two) times daily as needed (dry eyes).    ? Cholecalciferol (VITAMIN D) 50 MCG (2000 UT) tablet Take 2,000 Units by mouth daily.    ? cholestyramine (QUESTRAN) 4 g packet Take 1 packet (4 g total) by mouth 3 (three) times daily with meals. (Patient taking differently: Take 4 g by mouth 3 (three) times daily as needed (diarrhea).) 60 each 12  ? diclofenac sodium (VOLTAREN) 1 % GEL Apply 2 g topically 2 (two) times daily as needed (pain).     ? diltiazem (CARDIZEM CD) 180 MG 24 hr capsule Take 1 capsule (180 mg total) by mouth daily. 30 capsule 0  ? esomeprazole (NEXIUM)  40 MG capsule Take 1 capsule (40 mg total) by mouth daily. 90 capsule 3  ? hydroxychloroquine (PLAQUENIL) 200 MG tablet Take 200 mg by mouth daily.    ? Melatonin 10 MG TABS Take 10 mg by mouth at bedtime as needed (sleep).     ? METAMUCIL FIBER PO Take 1 Scoop by mouth daily.    ? metoprolol tartrate (LOPRESSOR) 25 MG tablet Take 1 tablet (25 mg total) by mouth 2 (two) times daily. 60 tablet 0  ? montelukast (SINGULAIR) 10 MG tablet Take 1 tablet (10 mg total) by mouth at bedtime. 90 tablet 3  ? Probiotic Product (PROBIOTIC PO) Take 1 tablet by mouth daily.    ? rosuvastatin (CRESTOR) 10 MG tablet Take 1 tablet (10 mg total) by mouth daily. 90 tablet 3  ? vitamin B-12 (CYANOCOBALAMIN) 1000 MCG tablet Take 1,000 mcg by  mouth daily.     ? cyclobenzaprine (FLEXERIL) 10 MG tablet Take 1 tablet (10 mg total) by mouth 3 (three) times daily as needed for muscle spasms. 90 tablet 0  ? Turmeric (QC TUMERIC COMPLEX PO) Take by mouth. (Patient not taking: Reported on 02/03/2022)    ? ?No current facility-administered medications for this visit.  ? ? ?Allergies-reviewed and updated ?Allergies  ?Allergen Reactions  ? Latex Shortness Of Breath and Swelling  ?  Lip and eye swelling  ? Meperidine Hcl Other (See Comments)  ?  diarrhea  ? Morphine And Related Other (See Comments)  ?  Makes her cramp in abdomen ?  ? Onion Diarrhea and Other (See Comments)  ?  Stomach cramps  ? ? ?Social History  ? ?Socioeconomic History  ? Marital status: Widowed  ?  Spouse name: Not on file  ? Number of children: Not on file  ? Years of education: Not on file  ? Highest education level: Not on file  ?Occupational History  ? Occupation: retired  ?Tobacco Use  ? Smoking status: Never  ? Smokeless tobacco: Never  ?Vaping Use  ? Vaping Use: Never used  ?Substance and Sexual Activity  ? Alcohol use: Yes  ?  Alcohol/week: 8.0 standard drinks  ?  Types: 8 Glasses of wine per week  ?  Comment: 3 times daily  ? Drug use: Never  ? Sexual activity: Not  Currently  ?Other Topics Concern  ? Not on file  ?Social History Narrative  ? Not on file  ? ?Social Determinants of Health  ? ?Financial Resource Strain: Low Risk   ? Difficulty of Paying Living Expenses: Not hard a

## 2022-02-03 NOTE — Assessment & Plan Note (Signed)
Appears to be in sinus rhythm today.  We will continue management per cardiology.  She currently has Zio patch in place.  She is on diltiazem 180 mg daily, metoprolol tartrate 25 mg twice daily, and anticoagulant Eliquis 5 mg twice daily.  She will follow-up with cardiology in a few weeks. ?

## 2022-02-03 NOTE — Assessment & Plan Note (Signed)
No red flags.  Flexeril refilled today. ?

## 2022-02-03 NOTE — Patient Instructions (Signed)
It was very nice to see you today! ? ?I am glad that you are feeling better. ? ?I will refill your Flexeril today. ? ?We will see you back later this summer.  Please come back to see [place] sooner if needed. ? ?Take care, ?Dr Jerline Pain ? ?PLEASE NOTE: ? ?If you had any lab tests please let us know if you have not heard back within a few days. You may see your results on mychart before we have a chance to review them but we will give you a call once they are reviewed by [place]. If we ordered any referrals today, please let us know if you have not heard from their office within the next week.  ? ?Please try these tips to maintain a healthy lifestyle: ? ?Eat at least 3 REAL meals and 1-2 snacks per day.  Aim for no more than 5 hours between eating.  If you eat breakfast, please do so within one hour of getting up.  ? ?Each meal should contain half fruits/vegetables, one quarter protein, and one quarter carbs (no bigger than a computer mouse) ? ?Cut down on sweet beverages. This includes juice, soda, and sweet tea.  ? ?Drink at least 1 glass of water with each meal and aim for at least 8 glasses per day ? ?Exercise at least 150 minutes every week.   ?

## 2022-02-03 NOTE — Assessment & Plan Note (Signed)
Blood pressure at goal today on diltiazem 180 mg daily and metoprolol tartrate 25 mg twice daily. ?

## 2022-02-15 DIAGNOSIS — Z20822 Contact with and (suspected) exposure to covid-19: Secondary | ICD-10-CM | POA: Diagnosis not present

## 2022-02-17 DIAGNOSIS — I4892 Unspecified atrial flutter: Secondary | ICD-10-CM | POA: Diagnosis not present

## 2022-02-28 ENCOUNTER — Telehealth: Payer: Self-pay | Admitting: *Deleted

## 2022-02-28 NOTE — Telephone Encounter (Signed)
? ?  Pre-operative Risk Assessment  ?  ?Patient Name: Denise Payne  ?DOB: Jul 20, 1946 ?MRN: [id number]  ? ? ? ?Request for Surgical Clearance   ? ?Procedure:  Dental Extraction - Amount of Teeth to be Pulled:  EXTRACTION OF 2 TEETH (TOOTH # 12 AND # 30) WITH BONE GRAFTING AT SITE OF TOOTH # 30 ? ?Date of Surgery:  Clearance TBD                              ?   ?Surgeon:  DR. Tamela Oddi, DDS ?Surgeon's Group or Practice Name:  [facility] ?Phone number:  [phone] ?Fax number:  [phone] ?  ?Type of Clearance Requested:   ?- Medical  ?- Pharmacy:  Hold Apixaban (Eliquis)   ?  ?Type of Anesthesia:  Local  or LIGHT INTRAVENOUS ANESTHESIA ?  ?Additional requests/questions:   ? ?Signed, ?Julaine Hua   ?02/28/2022, 5:22 PM  ? ?

## 2022-03-01 NOTE — Telephone Encounter (Signed)
? ?  Name: Denise Payne  ?DOB: 05-Aug-1946  ?MRN: [id number] ? ?Primary Cardiologist: Sanda Klein, MD ? ?Chart reviewed as part of pre-operative protocol coverage. Because of [name]'s past medical history and time since last visit, she will require a follow-up in-office visit in order to better assess preoperative cardiovascular risk as patient was recently hospitalized in the setting of new atrial flutter/fibrillation. She was just recently started on Eliquis. Will defer to MD on request to hold Eliquis prior to procedure. ? ?Pre-op covering staff: ?- Please schedule appointment and call patient to inform them. If patient already had an upcoming appointment within acceptable timeframe, please add "pre-op clearance" to the appointment notes so provider is aware. ?- Please contact requesting surgeon's office via preferred method (i.e, phone, fax) to inform them of need for appointment prior to surgery. ? ?Lenna Sciara, NP  ?03/01/2022, 3:11 PM  ? ?

## 2022-03-02 NOTE — Telephone Encounter (Signed)
Pt is scheduled to see Dr Lovena Le on 5/23 for surgical clearance

## 2022-03-07 ENCOUNTER — Encounter: Payer: Self-pay | Admitting: Internal Medicine

## 2022-03-07 ENCOUNTER — Ambulatory Visit (INDEPENDENT_AMBULATORY_CARE_PROVIDER_SITE_OTHER): Payer: Medicare Other | Admitting: Internal Medicine

## 2022-03-07 VITALS — BP 142/70 | HR 61 | Ht 62.5 in | Wt 147.6 lb

## 2022-03-07 DIAGNOSIS — I4892 Unspecified atrial flutter: Secondary | ICD-10-CM

## 2022-03-07 MED ORDER — APIXABAN 5 MG PO TABS: 5.0000 mg | ORAL_TABLET | Freq: Two times a day (BID) | ORAL | 1 refills | Status: DC

## 2022-03-07 MED ORDER — DILTIAZEM HCL ER COATED BEADS 180 MG PO CP24: 180.0000 mg | ORAL_CAPSULE | Freq: Every day | ORAL | 3 refills | Status: DC

## 2022-03-07 MED ORDER — METOPROLOL TARTRATE 25 MG PO TABS: 25.0000 mg | ORAL_TABLET | Freq: Two times a day (BID) | ORAL | 3 refills | Status: DC

## 2022-03-07 NOTE — Progress Notes (Addendum)
HPI Denise Payne is referred by Dr. Radford Pax for evaluation of atrial flutter. She is a pleasant 76 yo woman with HTN who was preop for eye surgery and found to be in atrial flutter with a CVR/RVR. She was to undergo TEE guided DCCV but has spontaneous return to NSR. She has been placed on eliquis. She feels well. She did not know that she was out of rhythm. She is still pending both oral surgery and eye surgery.  Allergies  Allergen Reactions   Latex Shortness Of Breath and Swelling    Lip and eye swelling   Meperidine Hcl Other (See Comments)    diarrhea   Morphine And Related Other (See Comments)    Makes her cramp in abdomen    Onion Diarrhea and Other (See Comments)    Stomach cramps     Current Outpatient Medications  Medication Sig Dispense Refill   acetaminophen (TYLENOL) 500 MG tablet Take 1,000 mg by mouth every 6 (six) hours as needed for mild pain.     albuterol (PROVENTIL HFA;VENTOLIN HFA) 108 (90 Base) MCG/ACT inhaler Inhale 2 puffs into the lungs every 6 (six) hours as needed for wheezing or shortness of breath.     apixaban (ELIQUIS) 5 MG TABS tablet Take 1 tablet (5 mg total) by mouth 2 (two) times daily. 60 tablet 0   Biotin 10000 MCG TABS Take 10,000 mcg by mouth daily.     carboxymethylcellulose (REFRESH PLUS) 0.5 % SOLN Place 1 drop into both eyes 2 (two) times daily as needed (dry eyes).     Cholecalciferol (VITAMIN D) 50 MCG (2000 UT) tablet Take 2,000 Units by mouth daily.     cholestyramine (QUESTRAN) 4 g packet Take 1 packet (4 g total) by mouth 3 (three) times daily with meals. (Patient taking differently: Take 4 g by mouth 3 (three) times daily as needed (diarrhea).) 60 each 12   cyclobenzaprine (FLEXERIL) 10 MG tablet Take 1 tablet (10 mg total) by mouth 3 (three) times daily as needed for muscle spasms. 90 tablet 0   diclofenac sodium (VOLTAREN) 1 % GEL Apply 2 g topically 2 (two) times daily as needed (pain).      diltiazem (CARDIZEM CD) 180 MG 24 hr  capsule Take 1 capsule (180 mg total) by mouth daily. 30 capsule 0   esomeprazole (NEXIUM) 40 MG capsule Take 1 capsule (40 mg total) by mouth daily. 90 capsule 3   hydroxychloroquine (PLAQUENIL) 200 MG tablet Take 200 mg by mouth daily.     Melatonin 10 MG TABS Take 10 mg by mouth at bedtime as needed (sleep).      METAMUCIL FIBER PO Take 1 Scoop by mouth daily.     metoprolol tartrate (LOPRESSOR) 25 MG tablet Take 1 tablet (25 mg total) by mouth 2 (two) times daily. 60 tablet 0   montelukast (SINGULAIR) 10 MG tablet Take 1 tablet (10 mg total) by mouth at bedtime. 90 tablet 3   Probiotic Product (PROBIOTIC PO) Take 1 tablet by mouth daily.     rosuvastatin (CRESTOR) 10 MG tablet Take 1 tablet (10 mg total) by mouth daily. 90 tablet 3   Turmeric (QC TUMERIC COMPLEX PO) Take by mouth.     vitamin B-12 (CYANOCOBALAMIN) 1000 MCG tablet Take 1,000 mcg by mouth daily.      No current facility-administered medications for this visit.     Past Medical History:  Diagnosis Date   Anemia    Asthma  Cataract    GERD (gastroesophageal reflux disease)    Osteoporosis    Sjogren's syndrome ([place]) 01/26/2019    ROS:   All systems reviewed and negative except as noted in the HPI.   Past Surgical History:  Procedure Laterality Date   ABDOMINAL HYSTERECTOMY  1996   ANKLE FRACTURE SURGERY  2005   AUGMENTATION MAMMAPLASTY Bilateral 10/16/1976   CHOLECYSTECTOMY N/A 12/07/2020   Procedure: LAPAROSCOPIC CHOLECYSTECTOMY WITH INTRAOPERATIVE CHOLANGIOGRAM;  Surgeon: Kieth Brightly Arta Bruce, MD;  Location: [facility];  Service: General;  Laterality: N/A;   CYSTOSCOPY WITH STENT PLACEMENT N/A 05/22/2019   Procedure: CYSTOSCOPY WITH STENT PLACEMENT;  Surgeon: Irine Seal, MD;  Location: [facility];  Service: Urology;  Laterality: N/A;   FLEXIBLE SIGMOIDOSCOPY N/A 05/22/2019   Procedure: FLEXIBLE SIGMOIDOSCOPY;  Surgeon: Ileana Roup, MD;  Location: [facility];  Service: General;  Laterality: N/A;   XI ROBOTIC  ASSISTED LOWER ANTERIOR RESECTION Bilateral 05/22/2019   Procedure: XI ROBOTIC ASSISTED SIGMOIDECTOMY;  Surgeon: Ileana Roup, MD;  Location: [facility];  Service: General;  Laterality: Bilateral;     Family History  Problem Relation Age of Onset   Breast cancer Mother 69   Hearing loss Mother    Heart disease Mother    High Cholesterol Mother    Hypertension Mother    Cancer Father    Hearing loss Father    Stroke Father    Transient ischemic attack Father    Arthritis Maternal Grandmother    Cancer Maternal Grandmother    Early death Maternal Grandmother    Diabetes Maternal Grandfather    Early death Maternal Grandfather    Hearing loss Maternal Grandfather    Heart attack Maternal Grandfather    Heart disease Maternal Grandfather    Arthritis Paternal Grandmother    Early death Paternal Grandmother    Hearing loss Paternal Grandmother    Hyperlipidemia Paternal Grandmother    Early death Paternal Grandfather    Kidney disease Paternal 12    Cancer Sister    Hyperlipidemia Sister    Hypertension Sister    Birth defects Brother    Cancer Brother    Hyperlipidemia Brother      Social History   Socioeconomic History   Marital status: Widowed    Spouse name: Not on file   Number of children: Not on file   Years of education: Not on file   Highest education level: Not on file  Occupational History   Occupation: retired  Tobacco Use   Smoking status: Never   Smokeless tobacco: Never  Vaping Use   Vaping Use: Never used  Substance and Sexual Activity   Alcohol use: Yes    Alcohol/week: 8.0 standard drinks    Types: 8 Glasses of wine per week    Comment: 3 times daily   Drug use: Never   Sexual activity: Not Currently  Other Topics Concern   Not on file  Social History Narrative   Not on file   Social Determinants of Health   Financial Resource Strain: Low Risk    Difficulty of Paying Living Expenses: Not hard at all  Food Insecurity: No  Food Insecurity   Worried About Charity fundraiser in the Last Year: Never true   [place] in the Last Year: Never true  Transportation Needs: No Transportation Needs   Lack of Transportation (Medical): No   Lack of Transportation (Non-Medical): No  Physical Activity: Inactive   Days of Exercise per Week:  0 days   Minutes of Exercise per Session: 0 min  Stress: No Stress Concern Present   Feeling of Stress : Not at all  Social Connections: Moderately Isolated   Frequency of Communication with Friends and Family: More than three times a week   Frequency of Social Gatherings with Friends and Family: More than three times a week   Attends Religious Services: More than 4 times per year   Active Member of [employer] or Organizations: No   Attends Archivist Meetings: Never   Marital Status: Widowed  Human resources officer Violence: Not At Risk   Fear of Current or Ex-Partner: No   Emotionally Abused: No   Physically Abused: No   Sexually Abused: No     BP (!) 142/70   Pulse 61   Ht 5' 2.5" (1.588 m)   Wt 147 lb 9.6 oz (67 kg)   SpO2 97%   BMI 26.57 kg/m   Physical Exam:  Well appearing NAD HEENT: Unremarkable Neck:  No JVD, no thyromegally Lymphatics:  No adenopathy Back:  No CVA tenderness Lungs:  Clear with no wheezes HEART:  Regular rate rhythm, no murmurs, no rubs, no clicks Abd:  soft, positive bowel sounds, no organomegally, no rebound, no guarding Ext:  2 plus pulses, no edema, no cyanosis, no clubbing Skin:  No rashes no nodules Neuro:  CN II through XII intact, motor grossly intact  EKG - NSR   Assess/Plan:  Atrial flutter - we discussed the treatment options in detail. She was asymptomatic and has had only one episode of atrial flutter. She is at increased risk for developing atrial fib as well. I recommended watchful waiting. If she has more atrial flutter then catheter ablation would be recommended. Preoperative eval - she is pending both eye surgery  and oral surgery. She is low risk for both from a cardiac perspective and may proceed. Addendum: she may receive IV sedation.  Coags - she will continue eliquis. As the half life is 6-8 hours for eliquis, she may hold her blood thinner up to 3 days before surgery. Eliquis can be restarted when her surgeon feels the risk of post op bleeding is acceptable.  HTN - her bp is fairly well controlled. No change.  Carleene Overlie Aurilla Coulibaly,MD

## 2022-03-07 NOTE — Patient Instructions (Addendum)
Medication Instructions:  Your physician recommends that you continue on your current medications as directed. Please refer to the Current Medication list given to you today.  Labwork: None ordered.  Testing/Procedures: None ordered.  Follow-Up: Your physician wants you to follow-up in: 4 months with Gregg Taylor, MD    Any Other Special Instructions Will Be Listed Below (If Applicable).  If you need a refill on your cardiac medications before your next appointment, please call your pharmacy.   Important Information About Sugar        

## 2022-03-08 NOTE — Telephone Encounter (Signed)
Calling back for update for patient surgery. Please advise

## 2022-03-08 NOTE — Telephone Encounter (Signed)
Preoperative team, patient is cleared in note from 03/07/2022.  Please forward Dr. Tanna Furry note to the requesting office.  I will remove the patient from the preoperative pool.  Thank you for your help.  Jossie Ng. Ahonesty Woodfin NP-C    03/08/2022, 12:07 PM [place] [place] [address] Office [phone] Fax [phone]

## 2022-03-09 NOTE — Telephone Encounter (Signed)
Clearance notes have been re-faxed 03/09/22

## 2022-03-14 DIAGNOSIS — M25561 Pain in right knee: Secondary | ICD-10-CM | POA: Diagnosis not present

## 2022-03-14 DIAGNOSIS — Z6826 Body mass index (BMI) 26.0-26.9, adult: Secondary | ICD-10-CM | POA: Diagnosis not present

## 2022-03-14 DIAGNOSIS — M1991 Primary osteoarthritis, unspecified site: Secondary | ICD-10-CM | POA: Diagnosis not present

## 2022-03-14 DIAGNOSIS — M5136 Other intervertebral disc degeneration, lumbar region: Secondary | ICD-10-CM | POA: Diagnosis not present

## 2022-03-14 DIAGNOSIS — E663 Overweight: Secondary | ICD-10-CM | POA: Diagnosis not present

## 2022-03-14 DIAGNOSIS — M3500 Sicca syndrome, unspecified: Secondary | ICD-10-CM | POA: Diagnosis not present

## 2022-03-14 DIAGNOSIS — R5383 Other fatigue: Secondary | ICD-10-CM | POA: Diagnosis not present

## 2022-03-20 ENCOUNTER — Telehealth: Payer: Self-pay | Admitting: Cardiovascular Disease

## 2022-03-20 NOTE — Telephone Encounter (Signed)
Calling to f/u on Clearance for pt to have procedure. [facility] states that they have yet to receive any notes or an okay for pt to have procedure. They would like a call once Clearance has been faxed over for confirmation. Please advise

## 2022-03-20 NOTE — Telephone Encounter (Signed)
Patient was cleared for surgery by Dr. Lovena Le.  I have forwarded Dr. Tanna Furry office note to the requesting provider.

## 2022-03-20 NOTE — Telephone Encounter (Signed)
Will forward to pre op provider for review if the pt has been cleared. Looks like pt recently saw Dr. Lovena Le.

## 2022-03-21 NOTE — Telephone Encounter (Signed)
Please see note below. 

## 2022-03-21 NOTE — Telephone Encounter (Signed)
"  Coags - she will continue eliquis. As the half life is 6-8 hours for eliquis, she may hold her blood thinner up to 3 days before surgery. Eliquis can be restarted when her surgeon feels the risk of post op bleeding is acceptable".

## 2022-03-21 NOTE — Telephone Encounter (Signed)
Per Dr. Lovena Le "Preoperative eval - she is pending both eye surgery and oral surgery. She is low risk for both from a cardiac perspective and may proceed".  Given past medical history and time since last visit, based on ACC/AHA guidelines, Denise Payne would be at acceptable risk for the planned procedure without further cardiovascular testing.   I will route this recommendation to the requesting party via Epic fax function and remove from pre-op pool.  Please call with questions.  [place], [place] 03/21/2022, 9:37 AM

## 2022-03-21 NOTE — Telephone Encounter (Signed)
Office called back to say they received the notes and lab but the need the actual clearance to say that she is cleared. Please advise

## 2022-03-27 NOTE — Telephone Encounter (Signed)
Sandy from Oral Surgery called back following up clearance. She is still waiting for the complete clearance form for the patient. She ask that you please call back today at [phone]

## 2022-03-27 NOTE — Telephone Encounter (Signed)
Dr. Lovena Le has amended his ov note to reflect ok to use IV sedation if needed. I will fax over all notes involved in this clearance to Dr. Buelah Manis, [place].

## 2022-03-27 NOTE — Telephone Encounter (Signed)
I s/w Sandy with Dr. Buelah Manis, DDS office. I did let her know that I had a verbal ok back from Dr. Lovena Le, ok to use IV sedation if needed. I did state that I have asked MD to please amend their ov note with the ok to use IV sedation. I assured Lovey Newcomer, that once I  have the amended ov note I will fax that over to her as well. Lovey Newcomer thanked me for all of my help in this matter.

## 2022-03-27 NOTE — Telephone Encounter (Signed)
I s/w Sandy with Dr. Buelah Manis office. Per Lovey Newcomer, Dr. Buelah Manis wants to be sure if it is ok to use Lite IV sedation; if not they will then proceed with the procedure using local.    I assured Lovey Newcomer that I will send a message back to Dr. Lovena Le his input if ok to use lite IV sedation or would he prefer the DDS use local., Once I have recommendations from Dr. Lovena Le, I will call [facility] and fax notes back to her as well. Lovey Newcomer thanked me for the help.

## 2022-03-29 ENCOUNTER — Telehealth: Payer: Self-pay | Admitting: *Deleted

## 2022-03-29 DIAGNOSIS — H02834 Dermatochalasis of left upper eyelid: Secondary | ICD-10-CM | POA: Diagnosis not present

## 2022-03-29 DIAGNOSIS — H02831 Dermatochalasis of right upper eyelid: Secondary | ICD-10-CM | POA: Diagnosis not present

## 2022-03-29 DIAGNOSIS — Z01818 Encounter for other preprocedural examination: Secondary | ICD-10-CM | POA: Diagnosis not present

## 2022-03-29 NOTE — Telephone Encounter (Signed)
   Primary Cardiologist: Sanda Klein, MD  Chart reviewed as part of pre-operative protocol coverage. Given past medical history and time since last visit, based on ACC/AHA guidelines, Denise Payne would be at acceptable risk for the planned procedure without further cardiovascular testing.   As the half life is 6-8 hours for eliquis, she may hold her blood thinner up to 3 days before surgery. Eliquis can be restarted when her surgeon feels the risk of post op bleeding is acceptable.   Per Dr. Lovena Le "Preoperative eval - she is pending both eye surgery and oral surgery. She is low risk for both from a cardiac perspective and may proceed".   I will route this recommendation to the requesting party via Epic fax function and remove from pre-op pool.  Please call with questions.  [name], NP-C    03/29/2022, 2:44 PM [place] [address] Office [phone] Fax [phone]

## 2022-03-29 NOTE — Telephone Encounter (Signed)
See notes Dr. Lovena Le has cleared the pt for both of her upcoming procedures, dental work and eye procedure. Our office just received the clearance request though for the eye procedure. Dental clearance has already been addressed.     Pre-operative Risk Assessment    Patient Name: Denise Payne  DOB: 1946/09/28 MRN: [id number]      Request for Surgical Clearance    Procedure:   B/L BLEPHAROPLASTY WITH FAT PAD  Date of Surgery:  Clearance 04/12/22                                 Surgeon:  DR. Delia Chimes Surgeon's Group or Practice Name:  [facility]  Phone number:  [phone] Fax number:  [phone]   Type of Clearance Requested:   - Medical  - Pharmacy:  Hold Apixaban (Eliquis)     Type of Anesthesia:   IV SEDATION   Additional requests/questions:    Jiles Prows   03/29/2022, 12:27 PM

## 2022-04-10 DIAGNOSIS — L820 Inflamed seborrheic keratosis: Secondary | ICD-10-CM | POA: Diagnosis not present

## 2022-04-10 DIAGNOSIS — D2262 Melanocytic nevi of left upper limb, including shoulder: Secondary | ICD-10-CM | POA: Diagnosis not present

## 2022-04-10 DIAGNOSIS — D1801 Hemangioma of skin and subcutaneous tissue: Secondary | ICD-10-CM | POA: Diagnosis not present

## 2022-04-10 DIAGNOSIS — L821 Other seborrheic keratosis: Secondary | ICD-10-CM | POA: Diagnosis not present

## 2022-04-10 DIAGNOSIS — Z85828 Personal history of other malignant neoplasm of skin: Secondary | ICD-10-CM | POA: Diagnosis not present

## 2022-04-10 DIAGNOSIS — D692 Other nonthrombocytopenic purpura: Secondary | ICD-10-CM | POA: Diagnosis not present

## 2022-04-12 ENCOUNTER — Telehealth: Payer: Self-pay | Admitting: Home Health

## 2022-04-12 NOTE — Telephone Encounter (Signed)
Called the patient, arranged A fib clinic appt 04/13/22 at 1:30pm. Inform her appointment location and time, she is agreeable. She states she came home, took her diltiazem. She is feeling little tired otherwise well, denied any cardiac symptoms. Advised patient to monitor HR, BP, and symptoms. Call back if anything changes.

## 2022-04-12 NOTE — Telephone Encounter (Signed)
Dr Truman Hayward from [facility] called, reporting patient is having belpharoplasty today, noted with rapid heart rate 140s, concerning for A fib RVR.  She is asymptomatic. Surgery is elective and surgeon is planning on cancelling it. She did not take her AM Diltiazem yet. Advised the Dr Truman Hayward to inform the patient to resume AM meds, monitor symptoms for A fib, call back if needed. Will arrange A fib clinic appointment for the patient in 1 week.

## 2022-04-13 ENCOUNTER — Ambulatory Visit (HOSPITAL_COMMUNITY)
Admission: RE | Admit: 2022-04-13 | Discharge: 2022-04-13 | Disposition: A | Discharge status: Home / Self Care | Payer: Medicare Other | Source: Ambulatory Visit | Attending: Nurse Practitioner | Admitting: Nurse Practitioner

## 2022-04-13 ENCOUNTER — Encounter (HOSPITAL_COMMUNITY): Payer: Self-pay | Admitting: Nurse Practitioner

## 2022-04-13 VITALS — BP 112/60 | HR 77 | Ht 62.5 in | Wt 147.4 lb

## 2022-04-13 DIAGNOSIS — Z7901 Long term (current) use of anticoagulants: Secondary | ICD-10-CM | POA: Diagnosis not present

## 2022-04-13 DIAGNOSIS — D6869 Other thrombophilia: Secondary | ICD-10-CM

## 2022-04-13 DIAGNOSIS — I483 Typical atrial flutter: Secondary | ICD-10-CM | POA: Diagnosis not present

## 2022-04-13 DIAGNOSIS — I4892 Unspecified atrial flutter: Secondary | ICD-10-CM | POA: Diagnosis present

## 2022-04-13 NOTE — Progress Notes (Signed)
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 female with a h/o atrail flutter recently seen by Dr. [name] for preop clearance. She was pending oral and eye surgery. The first eye surgery she was in atrail flutter and it was cancelled. She was able to have her oral surgery but yesterday presented again atria flutter and had her surgery cancelled with f/u here. She is rate controlled today and is unaware of her rhythm. She is back on her eliquis having missed a few days for pre op. She is on plaquenil that is contraindicated with all antiarrythmic's. Dr. [name] note indicated that she would need an ablation if arrhythmia  returned.  She is planning to travel aboard in one month.     Today, she denies symptoms of palpitations, chest pain, shortness of breath, orthopnea, PND, lower extremity edema, dizziness, presyncope, syncope, or neurologic sequela. The patient is tolerating medications without difficulties and is otherwise without complaint today.   Past Medical History:  Diagnosis Date   Anemia    Asthma    Cataract    GERD (gastroesophageal reflux disease)    Osteoporosis    Sjogren's syndrome ([place]) 01/26/2019   Past Surgical History:  Procedure Laterality Date   ABDOMINAL HYSTERECTOMY  1996   ANKLE FRACTURE SURGERY  2005   AUGMENTATION MAMMAPLASTY Bilateral 10/16/1976   CHOLECYSTECTOMY N/A 12/07/2020   Procedure: LAPAROSCOPIC CHOLECYSTECTOMY WITH INTRAOPERATIVE CHOLANGIOGRAM;  Surgeon: Denise Brightly Arta Bruce, MD;  Location: [facility];  Service: General;  Laterality: N/A;   CYSTOSCOPY WITH STENT PLACEMENT N/A 05/22/2019   Procedure: CYSTOSCOPY WITH STENT PLACEMENT;  Surgeon: Denise Seal, MD;  Location: [facility];  Service: Urology;  Laterality: N/A;   FLEXIBLE SIGMOIDOSCOPY N/A 05/22/2019   Procedure: FLEXIBLE SIGMOIDOSCOPY;  Surgeon: Denise Roup, MD;  Location: [facility];  Service: General;   Laterality: N/A;   XI ROBOTIC ASSISTED LOWER ANTERIOR RESECTION Bilateral 05/22/2019   Procedure: XI ROBOTIC ASSISTED SIGMOIDECTOMY;  Surgeon: Denise Roup, MD;  Location: [facility];  Service: General;  Laterality: Bilateral;    Current Outpatient Medications  Medication Sig Dispense Refill   acetaminophen (TYLENOL) 500 MG tablet Take 1,000 mg by mouth every 6 (six) hours as needed for mild pain.     albuterol (PROVENTIL HFA;VENTOLIN HFA) 108 (90 Base) MCG/ACT inhaler Inhale 2 puffs into the lungs every 6 (six) hours as needed for wheezing or shortness of breath.     apixaban (ELIQUIS) 5 MG TABS tablet Take 1 tablet (5 mg total) by mouth 2 (two) times daily. 180 tablet 1   Biotin 10000 MCG TABS Take 10,000 mcg by mouth daily.     carboxymethylcellulose (REFRESH PLUS) 0.5 % SOLN Place 1 drop into both eyes 2 (two) times daily as needed (dry eyes).     Cholecalciferol (VITAMIN D) 50 MCG (2000 UT) tablet Take 2,000 Units by mouth daily.     cholestyramine (QUESTRAN) 4 g packet Take 1 packet (4 g total) by mouth 3 (three) times daily with meals. (Patient taking differently: Take 4 g by mouth 3 (three) times daily as needed (diarrhea).) 60 each 12   cyclobenzaprine (FLEXERIL) 10 MG tablet Take 1 tablet (10 mg total) by mouth 3 (three) times daily as needed for muscle spasms. 90 tablet 0   diclofenac sodium (VOLTAREN) 1 % GEL Apply 2 g topically 2 (two) times daily as needed (pain).      diltiazem (  CARDIZEM CD) 180 MG 24 hr capsule Take 1 capsule (180 mg total) by mouth daily. 90 capsule 3   esomeprazole (NEXIUM) 40 MG capsule Take 1 capsule (40 mg total) by mouth daily. 90 capsule 3   hydroxychloroquine (PLAQUENIL) 200 MG tablet Take 200 mg by mouth daily.     Melatonin 10 MG TABS Take 10 mg by mouth at bedtime as needed (sleep).      METAMUCIL FIBER PO Take 1 Scoop by mouth daily.     metoprolol tartrate (LOPRESSOR) 25 MG tablet Take 1 tablet (25 mg total) by mouth 2 (two) times daily. 180 tablet  3   montelukast (SINGULAIR) 10 MG tablet Take 1 tablet (10 mg total) by mouth at bedtime. 90 tablet 3   Probiotic Product (PROBIOTIC PO) Take 1 tablet by mouth daily.     rosuvastatin (CRESTOR) 10 MG tablet Take 1 tablet (10 mg total) by mouth daily. 90 tablet 3   vitamin B-12 (CYANOCOBALAMIN) 1000 MCG tablet Take 1,000 mcg by mouth daily.      No current facility-administered medications for this encounter.    Allergies  Allergen Reactions   Latex Shortness Of Breath and Swelling    Lip and eye swelling   Meperidine Hcl Other (See Comments)    diarrhea   Morphine And Related Other (See Comments)    Makes her cramp in abdomen    Onion Diarrhea and Other (See Comments)    Stomach cramps    Social History   Socioeconomic History   Marital status: Widowed    Spouse name: Not on file   Number of children: Not on file   Years of education: Not on file   Highest education level: Not on file  Occupational History   Occupation: retired  Tobacco Use   Smoking status: Never   Smokeless tobacco: Never  Vaping Use   Vaping Use: Never used  Substance and Sexual Activity   Alcohol use: Yes    Alcohol/week: 8.0 standard drinks of alcohol    Types: 8 Glasses of wine per week    Comment: 3 times daily   Drug use: Never   Sexual activity: Not Currently  Other Topics Concern   Not on file  Social History Narrative   Not on file   Social Determinants of Health   Financial Resource Strain: Low Risk  (09/01/2021)   Overall Financial Resource Strain (CARDIA)    Difficulty of Paying Living Expenses: Not hard at all  Food Insecurity: No Food Insecurity (09/01/2021)   Hunger Vital Sign    Worried About Running Out of Food in the Last Year: Never true    Ran Out of Food in the Last Year: Never true  Transportation Needs: No Transportation Needs (09/01/2021)   PRAPARE - Hydrologist (Medical): No    Lack of Transportation (Non-Medical): No  Physical  Activity: Inactive (09/01/2021)   Exercise Vital Sign    Days of Exercise per Week: 0 days    Minutes of Exercise per Session: 0 min  Stress: No Stress Concern Present (09/01/2021)   [place]    Feeling of Stress : Not at all  Social Connections: Moderately Isolated (09/01/2021)   Social Connection and Isolation Panel [NHANES]    Frequency of Communication with Friends and Family: More than three times a week    Frequency of Social Gatherings with Friends and Family: More than three times a week  Attends Religious Services: More than 4 times per year    Active Member of Clubs or Organizations: No    Attends Archivist Meetings: Never    Marital Status: Widowed  Intimate Partner Violence: Not At Risk (09/01/2021)   Humiliation, Afraid, Rape, and Kick questionnaire    Fear of Current or Ex-Partner: No    Emotionally Abused: No    Physically Abused: No    Sexually Abused: No    Family History  Problem Relation Age of Onset   Breast cancer Mother 62   Hearing loss Mother    Heart disease Mother    High Cholesterol Mother    Hypertension Mother    Cancer Father    Hearing loss Father    Stroke Father    Transient ischemic attack Father    Arthritis Maternal Grandmother    Cancer Maternal Grandmother    Early death Maternal Grandmother    Diabetes Maternal Grandfather    Early death Maternal Grandfather    Hearing loss Maternal Grandfather    Heart attack Maternal Grandfather    Heart disease Maternal Grandfather    Arthritis Paternal Grandmother    Early death Paternal Grandmother    Hearing loss Paternal Grandmother    Hyperlipidemia Paternal Grandmother    Early death Paternal Grandfather    Kidney disease Paternal Grandfather    Cancer Sister    Hyperlipidemia Sister    Hypertension Sister    Birth defects Brother    Cancer Brother    Hyperlipidemia Brother     ROS- All systems are  reviewed and negative except as per the HPI above  Physical Exam: Vitals:   04/13/22 1333  Weight: 66.9 kg  Height: 5' 2.5" (1.588 m)   Wt Readings from Last 3 Encounters:  04/13/22 66.9 kg  03/07/22 67 kg  02/03/22 65.6 kg    Labs: Lab Results  Component Value Date   NA 139 01/26/2022   K 3.8 01/26/2022   CL 110 01/26/2022   CO2 24 01/26/2022   GLUCOSE 94 01/26/2022   BUN 14 01/26/2022   CREATININE 0.76 01/26/2022   CALCIUM 9.1 01/26/2022   MG 1.8 01/25/2022   Lab Results  Component Value Date   INR 0.9 05/20/2019   Lab Results  Component Value Date   CHOL 150 12/27/2021   HDL 72.90 12/27/2021   LDLCALC 54 12/27/2021   TRIG 113.0 12/27/2021     GEN- The patient is well appearing, alert and oriented x 3 today.   Head- normocephalic, atraumatic Eyes-  Sclera clear, conjunctiva pink Ears- hearing intact Oropharynx- clear Neck- supple, no JVP Lymph- no cervical lymphadenopathy Lungs- Clear to ausculation bilaterally, normal work of breathing Heart- Regular rate and rhythm, no murmurs, rubs or gallops, PMI not laterally displaced GI- soft, NT, ND, + BS Extremities- no clubbing, cyanosis, or edema MS- no significant deformity or atrophy Skin- no rash or lesion Psych- euthymic mood, full affect Neuro- strength and sensation are intact  EKG-Vent. rate 77 BPM PR interval * ms QRS duration 90 ms QT/QTcB 378/427 ms P-R-T axes 107 -29 32 Atrial flutter with 4:1 A-V conduction Low voltage QRS Cannot rule out Anterior infarct , age undetermined Abnormal ECG When compared with ECG of 27-Jan-2022 09:05, PREVIOUS ECG IS PRESENT    Assessment and Plan:  1. Typical atrial flutter Has returned to atrial flutter, rate controlled She is asymptomatic  Continue diltiazem 180 mg daily and metoprolol 25 mg bid  Plaquenil interacts with all  antiarrythmic's so she will return to Dr. [name] to be scheduled for typical atrial  flutter ablation   2. CHA2DS2VASc  score  of at least 4 Continue eliquis 5 mg bid, back on since yesterday   She is planning to travel abroad  leaving in the next month   To Dr. Smitty Knudsen C. Jjesus Payne, [facility] [address] [place], [place] [address] [phone]

## 2022-04-21 ENCOUNTER — Ambulatory Visit (INDEPENDENT_AMBULATORY_CARE_PROVIDER_SITE_OTHER): Payer: Medicare Other | Admitting: Internal Medicine

## 2022-04-21 ENCOUNTER — Encounter: Payer: Self-pay | Admitting: Internal Medicine

## 2022-04-21 VITALS — BP 106/62 | HR 63 | Ht 62.5 in | Wt 150.0 lb

## 2022-04-21 DIAGNOSIS — I483 Typical atrial flutter: Secondary | ICD-10-CM

## 2022-04-21 NOTE — Patient Instructions (Addendum)
Medication Instructions:  Your physician recommends that you continue on your current medications as directed. Please refer to the Current Medication list given to you today.  *If you need a refill on your cardiac medications before your next appointment, please call your pharmacy*  Lab Work NONE  If you have labs (blood work) drawn today and your tests are completely normal, you will receive your results only by: [place] (if you have MyChart) OR A paper copy in the mail If you have any lab test that is abnormal or we need to change your treatment, we will call you to review the results.  Testing/Procedures: None ordered.  Follow-Up: At [facility], you and your health needs are our priority.  As part of our continuing mission to provide you with exceptional heart care, we have created designated Provider Care Teams.  These Care Teams include your primary Cardiologist (physician) and Advanced Practice Providers (APPs -  Physician Assistants and Nurse Practitioners) who all work together to provide you with the care you need, when you need it.  We recommend signing up for the patient portal called "MyChart".  Sign up information is provided on this After Visit Summary.  MyChart is used to connect with patients for Virtual Visits (Telemedicine).  Patients are able to view lab/test results, encounter notes, upcoming appointments, etc.  Non-urgent messages can be sent to your provider as well.   To learn more about what you can do with MyChart, go to [email or web address].      The format for your next appointment:   See instruction letter  Provider:  F/u in 4 weeks [name], MD{    Important Information About Sugar           Cardiac Ablation Cardiac ablation is a procedure to destroy, or ablate, a small amount of heart tissue in very specific places. The heart has many electrical connections. Sometimes these connections are abnormal and can cause the heart  to beat very fast or irregularly. Ablating some of the areas that cause problems can improve the heart's rhythm or return it to normal. Ablation may be done for people who: Have Wolff-Parkinson-White syndrome. Have fast heart rhythms (tachycardia). Have taken medicines for an abnormal heart rhythm (arrhythmia) that were not effective or caused side effects. Have a high-risk heartbeat that may be life-threatening. During the procedure, a small incision is made in the neck or the groin, and a long, thin tube (catheter) is inserted into the incision and moved to the heart. Small devices (electrodes) on the tip of the catheter will send out electrical currents. A type of X-ray (fluoroscopy) will be used to help guide the catheter and to provide images of the heart. Tell a health care provider about: Any allergies you have. All medicines you are taking, including vitamins, herbs, eye drops, creams, and over-the-counter medicines. Any problems you or family members have had with anesthetic medicines. Any blood disorders you have. Any surgeries you have had. Any medical conditions you have, such as kidney failure. Whether you are pregnant or may be pregnant. What are the risks? Generally, this is a safe procedure. However, problems may occur, including: Infection. Bruising and bleeding at the catheter insertion site. Bleeding into the chest, especially into the sac that surrounds the heart. This is a serious complication. Stroke or blood clots. Damage to nearby structures or organs. Allergic reaction to medicines or dyes. Need for a permanent pacemaker if the normal electrical system is damaged. A pacemaker is a  small computer that sends electrical signals to the heart and helps your heart beat normally. The procedure not being fully effective. This may not be recognized until months later. Repeat ablation procedures are sometimes done. What happens before the procedure? Medicines Ask your health  care provider about: Changing or stopping your regular medicines. This is especially important if you are taking diabetes medicines or blood thinners. Taking medicines such as aspirin and ibuprofen. These medicines can thin your blood. Do not take these medicines unless your health care provider tells you to take them. Taking over-the-counter medicines, vitamins, herbs, and supplements. General instructions Follow instructions from your health care provider about eating or drinking restrictions. Plan to have someone take you home from the hospital or clinic. If you will be going home right after the procedure, plan to have someone with you for 24 hours. Ask your health care provider what steps will be taken to prevent infection. What happens during the procedure?  An IV will be inserted into one of your veins. You will be given a medicine to help you relax (sedative). The skin on your neck or groin will be numbed. An incision will be made in your neck or your groin. A needle will be inserted through the incision and into a large vein in your neck or groin. A catheter will be inserted into the needle and moved to your heart. Dye may be injected through the catheter to help your surgeon see the area of the heart that needs treatment. Electrical currents will be sent from the catheter to ablate heart tissue in desired areas. There are three types of energy that may be used to do this: Heat (radiofrequency energy). Laser energy. Extreme cold (cryoablation). When the tissue has been ablated, the catheter will be removed. Pressure will be held on the insertion area to prevent a lot of bleeding. A bandage (dressing) will be placed over the insertion area. The exact procedure may vary among health care providers and hospitals. What happens after the procedure? Your blood pressure, heart rate, breathing rate, and blood oxygen level will be monitored until you leave the hospital or clinic. Your  insertion area will be monitored for bleeding. You will need to lie still for a few hours to ensure that you do not bleed from the insertion area. Do not drive for 24 hours or as long as told by your health care provider. Summary Cardiac ablation is a procedure to destroy, or ablate, a small amount of heart tissue using an electrical current. This procedure can improve the heart rhythm or return it to normal. Tell your health care provider about any medical conditions you may have and all medicines you are taking to treat them. This is a safe procedure, but problems may occur. Problems may include infection, bruising, damage to nearby organs or structures, or allergic reactions to medicines. Follow your health care provider's instructions about eating and drinking before the procedure. You may also be told to change or stop some of your medicines. After the procedure, do not drive for 24 hours or as long as told by your health care provider. This information is not intended to replace advice given to you by your health care provider. Make sure you discuss any questions you have with your health care provider. Document Revised: 08/11/2019 Document Reviewed: 08/11/2019 Elsevier Patient Education  [place].

## 2022-04-21 NOTE — Progress Notes (Unsigned)
HPI Denise Payne is referred back today to discuss catheter ablation of atrial flutter. She is a pleasant 76 yo woman who has had 2 have 2 eye surgery's cancelled due to uncontrolled atrial flutter. When I first saw her she had only had a single episode of flutter and I recommended watchful waiting. She has gone back to atrial flutter.  Allergies  Allergen Reactions   Latex Shortness Of Breath and Swelling    Lip and eye swelling   Meperidine Hcl Diarrhea and Nausea And Vomiting   Morphine And Related Other (See Comments)    Makes her cramp in abdomen    Onion Diarrhea and Other (See Comments)    Stomach cramps     Current Outpatient Medications  Medication Sig Dispense Refill   acetaminophen (TYLENOL) 500 MG tablet Take 1,000 mg by mouth every 6 (six) hours as needed for mild pain.     albuterol (PROVENTIL HFA;VENTOLIN HFA) 108 (90 Base) MCG/ACT inhaler Inhale 2 puffs into the lungs every 6 (six) hours as needed for wheezing or shortness of breath.     apixaban (ELIQUIS) 5 MG TABS tablet Take 1 tablet (5 mg total) by mouth 2 (two) times daily. 180 tablet 1   Biotin 10000 MCG TABS Take 10,000 mcg by mouth daily.     carboxymethylcellulose (REFRESH PLUS) 0.5 % SOLN Place 1 drop into both eyes 2 (two) times daily as needed (dry eyes).     Cholecalciferol (VITAMIN D) 50 MCG (2000 UT) tablet Take 2,000 Units by mouth daily.     cholestyramine (QUESTRAN) 4 g packet Take 1 packet (4 g total) by mouth 3 (three) times daily with meals. (Patient taking differently: Take 4 g by mouth 3 (three) times daily as needed (diarrhea).) 60 each 12   cyclobenzaprine (FLEXERIL) 10 MG tablet Take 1 tablet (10 mg total) by mouth 3 (three) times daily as needed for muscle spasms. 90 tablet 0   diclofenac sodium (VOLTAREN) 1 % GEL Apply 2 g topically 2 (two) times daily as needed (pain).      diltiazem (CARDIZEM CD) 180 MG 24 hr capsule Take 1 capsule (180 mg total) by mouth daily. 90 capsule 3    esomeprazole (NEXIUM) 40 MG capsule Take 1 capsule (40 mg total) by mouth daily. 90 capsule 3   hydroxychloroquine (PLAQUENIL) 200 MG tablet Take 200 mg by mouth daily.     Melatonin 10 MG TABS Take 10 mg by mouth at bedtime as needed (sleep).      METAMUCIL FIBER PO Take 1 Scoop by mouth daily.     metoprolol tartrate (LOPRESSOR) 25 MG tablet Take 1 tablet (25 mg total) by mouth 2 (two) times daily. 180 tablet 3   montelukast (SINGULAIR) 10 MG tablet Take 1 tablet (10 mg total) by mouth at bedtime. 90 tablet 3   Probiotic Product (PROBIOTIC PO) Take 1 tablet by mouth daily.     rosuvastatin (CRESTOR) 10 MG tablet Take 1 tablet (10 mg total) by mouth daily. 90 tablet 3   vitamin B-12 (CYANOCOBALAMIN) 1000 MCG tablet Take 1,000 mcg by mouth daily.      No current facility-administered medications for this visit.     Past Medical History:  Diagnosis Date   Anemia    Asthma    Cataract    GERD (gastroesophageal reflux disease)    Osteoporosis    Sjogren's syndrome ([place]) 01/26/2019    ROS:   All systems reviewed and negative except as  noted in the HPI.   Past Surgical History:  Procedure Laterality Date   ABDOMINAL HYSTERECTOMY  1996   ANKLE FRACTURE SURGERY  2005   AUGMENTATION MAMMAPLASTY Bilateral 10/16/1976   CHOLECYSTECTOMY N/A 12/07/2020   Procedure: LAPAROSCOPIC CHOLECYSTECTOMY WITH INTRAOPERATIVE CHOLANGIOGRAM;  Surgeon: Kieth Brightly Arta Bruce, MD;  Location: [facility];  Service: General;  Laterality: N/A;   CYSTOSCOPY WITH STENT PLACEMENT N/A 05/22/2019   Procedure: CYSTOSCOPY WITH STENT PLACEMENT;  Surgeon: Irine Seal, MD;  Location: [facility];  Service: Urology;  Laterality: N/A;   FLEXIBLE SIGMOIDOSCOPY N/A 05/22/2019   Procedure: FLEXIBLE SIGMOIDOSCOPY;  Surgeon: Ileana Roup, MD;  Location: [facility];  Service: General;  Laterality: N/A;   XI ROBOTIC ASSISTED LOWER ANTERIOR RESECTION Bilateral 05/22/2019   Procedure: XI ROBOTIC ASSISTED SIGMOIDECTOMY;  Surgeon: Ileana Roup, MD;  Location: [facility];  Service: General;  Laterality: Bilateral;     Family History  Problem Relation Age of Onset   Breast cancer Mother 27   Hearing loss Mother    Heart disease Mother    High Cholesterol Mother    Hypertension Mother    Cancer Father    Hearing loss Father    Stroke Father    Transient ischemic attack Father    Arthritis Maternal Grandmother    Cancer Maternal Grandmother    Early death Maternal Grandmother    Diabetes Maternal Grandfather    Early death Maternal Grandfather    Hearing loss Maternal Grandfather    Heart attack Maternal Grandfather    Heart disease Maternal Grandfather    Arthritis Paternal Grandmother    Early death Paternal Grandmother    Hearing loss Paternal Grandmother    Hyperlipidemia Paternal Grandmother    Early death Paternal Grandfather    Kidney disease Paternal 75    Cancer Sister    Hyperlipidemia Sister    Hypertension Sister    Birth defects Brother    Cancer Brother    Hyperlipidemia Brother      Social History   Socioeconomic History   Marital status: Widowed    Spouse name: Not on file   Number of children: Not on file   Years of education: Not on file   Highest education level: Not on file  Occupational History   Occupation: retired  Tobacco Use   Smoking status: Never   Smokeless tobacco: Never  Vaping Use   Vaping Use: Never used  Substance and Sexual Activity   Alcohol use: Yes    Alcohol/week: 8.0 standard drinks of alcohol    Types: 8 Glasses of wine per week    Comment: 3 times daily   Drug use: Never   Sexual activity: Not Currently  Other Topics Concern   Not on file  Social History Narrative   Not on file   Social Determinants of Health   Financial Resource Strain: Low Risk  (09/01/2021)   Overall Financial Resource Strain (CARDIA)    Difficulty of Paying Living Expenses: Not hard at all  Food Insecurity: No Food Insecurity (09/01/2021)   Hunger Vital Sign     Worried About Running Out of Food in the Last Year: Never true    Ran Out of Food in the Last Year: Never true  Transportation Needs: No Transportation Needs (09/01/2021)   PRAPARE - Hydrologist (Medical): No    Lack of Transportation (Non-Medical): No  Physical Activity: Inactive (09/01/2021)   Exercise Vital Sign    Days of Exercise  per Week: 0 days    Minutes of Exercise per Session: 0 min  Stress: No Stress Concern Present (09/01/2021)   [place]    Feeling of Stress : Not at all  Social Connections: Moderately Isolated (09/01/2021)   Social Connection and Isolation Panel [NHANES]    Frequency of Communication with Friends and Family: More than three times a week    Frequency of Social Gatherings with Friends and Family: More than three times a week    Attends Religious Services: More than 4 times per year    Active Member of [employer] or Organizations: No    Attends Archivist Meetings: Never    Marital Status: Widowed  Intimate Partner Violence: Not At Risk (09/01/2021)   Humiliation, Afraid, Rape, and Kick questionnaire    Fear of Current or Ex-Partner: No    Emotionally Abused: No    Physically Abused: No    Sexually Abused: No     BP 106/62   Pulse 63   Ht 5' 2.5" (1.588 m)   Wt 150 lb (68 kg)   SpO2 99%   BMI 27.00 kg/m   Physical Exam:  Well appearing NAD HEENT: Unremarkable Neck:  No JVD, no thyromegally Lymphatics:  No adenopathy Back:  No CVA tenderness Lungs:  Clear HEART:  Regular rate rhythm, no murmurs, no rubs, no clicks Abd:  soft, positive bowel sounds, no organomegally, no rebound, no guarding Ext:  2 plus pulses, no edema, no cyanosis, no clubbing Skin:  No rashes no nodules Neuro:  CN II through XII intact, motor grossly intact  EKG - revuewed, atrial flutter with a controlled VR  Assess/Plan:  Atrial flutter - I have discussed the  indications/risks/benefits/goals/expectations of EP study and catheter ablation and she wishes to proceed. She will be on eliquis until her ablation.   Carleene Overlie Italo Banton,MD

## 2022-04-21 NOTE — H&P (View-Only) (Signed)
HPI Denise Payne is referred back today to discuss catheter ablation of atrial flutter. She is a pleasant 76 yo woman who has had 2 have 2 eye surgery's cancelled due to uncontrolled atrial flutter. When I first saw her she had only had a single episode of flutter and I recommended watchful waiting. She has gone back to atrial flutter.  Allergies  Allergen Reactions   Latex Shortness Of Breath and Swelling    Lip and eye swelling   Meperidine Hcl Diarrhea and Nausea And Vomiting   Morphine And Related Other (See Comments)    Makes her cramp in abdomen    Onion Diarrhea and Other (See Comments)    Stomach cramps     Current Outpatient Medications  Medication Sig Dispense Refill   acetaminophen (TYLENOL) 500 MG tablet Take 1,000 mg by mouth every 6 (six) hours as needed for mild pain.     albuterol (PROVENTIL HFA;VENTOLIN HFA) 108 (90 Base) MCG/ACT inhaler Inhale 2 puffs into the lungs every 6 (six) hours as needed for wheezing or shortness of breath.     apixaban (ELIQUIS) 5 MG TABS tablet Take 1 tablet (5 mg total) by mouth 2 (two) times daily. 180 tablet 1   Biotin 10000 MCG TABS Take 10,000 mcg by mouth daily.     carboxymethylcellulose (REFRESH PLUS) 0.5 % SOLN Place 1 drop into both eyes 2 (two) times daily as needed (dry eyes).     Cholecalciferol (VITAMIN D) 50 MCG (2000 UT) tablet Take 2,000 Units by mouth daily.     cholestyramine (QUESTRAN) 4 g packet Take 1 packet (4 g total) by mouth 3 (three) times daily with meals. (Patient taking differently: Take 4 g by mouth 3 (three) times daily as needed (diarrhea).) 60 each 12   cyclobenzaprine (FLEXERIL) 10 MG tablet Take 1 tablet (10 mg total) by mouth 3 (three) times daily as needed for muscle spasms. 90 tablet 0   diclofenac sodium (VOLTAREN) 1 % GEL Apply 2 g topically 2 (two) times daily as needed (pain).      diltiazem (CARDIZEM CD) 180 MG 24 hr capsule Take 1 capsule (180 mg total) by mouth daily. 90 capsule 3    esomeprazole (NEXIUM) 40 MG capsule Take 1 capsule (40 mg total) by mouth daily. 90 capsule 3   hydroxychloroquine (PLAQUENIL) 200 MG tablet Take 200 mg by mouth daily.     Melatonin 10 MG TABS Take 10 mg by mouth at bedtime as needed (sleep).      METAMUCIL FIBER PO Take 1 Scoop by mouth daily.     metoprolol tartrate (LOPRESSOR) 25 MG tablet Take 1 tablet (25 mg total) by mouth 2 (two) times daily. 180 tablet 3   montelukast (SINGULAIR) 10 MG tablet Take 1 tablet (10 mg total) by mouth at bedtime. 90 tablet 3   Probiotic Product (PROBIOTIC PO) Take 1 tablet by mouth daily.     rosuvastatin (CRESTOR) 10 MG tablet Take 1 tablet (10 mg total) by mouth daily. 90 tablet 3   vitamin B-12 (CYANOCOBALAMIN) 1000 MCG tablet Take 1,000 mcg by mouth daily.      No current facility-administered medications for this visit.     Past Medical History:  Diagnosis Date   Anemia    Asthma    Cataract    GERD (gastroesophageal reflux disease)    Osteoporosis    Sjogren's syndrome ([place]) 01/26/2019    ROS:   All systems reviewed and negative except as  noted in the HPI.   Past Surgical History:  Procedure Laterality Date   ABDOMINAL HYSTERECTOMY  1996   ANKLE FRACTURE SURGERY  2005   AUGMENTATION MAMMAPLASTY Bilateral 10/16/1976   CHOLECYSTECTOMY N/A 12/07/2020   Procedure: LAPAROSCOPIC CHOLECYSTECTOMY WITH INTRAOPERATIVE CHOLANGIOGRAM;  Surgeon: Kieth Brightly Arta Bruce, MD;  Location: [place];  Service: General;  Laterality: N/A;   CYSTOSCOPY WITH STENT PLACEMENT N/A 05/22/2019   Procedure: CYSTOSCOPY WITH STENT PLACEMENT;  Surgeon: Irine Seal, MD;  Location: [facility];  Service: Urology;  Laterality: N/A;   FLEXIBLE SIGMOIDOSCOPY N/A 05/22/2019   Procedure: FLEXIBLE SIGMOIDOSCOPY;  Surgeon: Ileana Roup, MD;  Location: [facility];  Service: General;  Laterality: N/A;   XI ROBOTIC ASSISTED LOWER ANTERIOR RESECTION Bilateral 05/22/2019   Procedure: XI ROBOTIC ASSISTED SIGMOIDECTOMY;  Surgeon: Ileana Roup, MD;  Location: [facility];  Service: General;  Laterality: Bilateral;     Family History  Problem Relation Age of Onset   Breast cancer Mother 27   Hearing loss Mother    Heart disease Mother    High Cholesterol Mother    Hypertension Mother    Cancer Father    Hearing loss Father    Stroke Father    Transient ischemic attack Father    Arthritis Maternal Grandmother    Cancer Maternal Grandmother    Early death Maternal Grandmother    Diabetes Maternal Grandfather    Early death Maternal Grandfather    Hearing loss Maternal Grandfather    Heart attack Maternal Grandfather    Heart disease Maternal Grandfather    Arthritis Paternal Grandmother    Early death Paternal Grandmother    Hearing loss Paternal Grandmother    Hyperlipidemia Paternal Grandmother    Early death Paternal Grandfather    Kidney disease Paternal 41    Cancer Sister    Hyperlipidemia Sister    Hypertension Sister    Birth defects Brother    Cancer Brother    Hyperlipidemia Brother      Social History   Socioeconomic History   Marital status: Widowed    Spouse name: Not on file   Number of children: Not on file   Years of education: Not on file   Highest education level: Not on file  Occupational History   Occupation: retired  Tobacco Use   Smoking status: Never   Smokeless tobacco: Never  Vaping Use   Vaping Use: Never used  Substance and Sexual Activity   Alcohol use: Yes    Alcohol/week: 8.0 standard drinks of alcohol    Types: 8 Glasses of wine per week    Comment: 3 times daily   Drug use: Never   Sexual activity: Not Currently  Other Topics Concern   Not on file  Social History Narrative   Not on file   Social Determinants of Health   Financial Resource Strain: Low Risk  (09/01/2021)   Overall Financial Resource Strain (CARDIA)    Difficulty of Paying Living Expenses: Not hard at all  Food Insecurity: No Food Insecurity (09/01/2021)   Hunger Vital Sign     Worried About Running Out of Food in the Last Year: Never true    Ran Out of Food in the Last Year: Never true  Transportation Needs: No Transportation Needs (09/01/2021)   PRAPARE - Hydrologist (Medical): No    Lack of Transportation (Non-Medical): No  Physical Activity: Inactive (09/01/2021)   Exercise Vital Sign    Days of Exercise  per Week: 0 days    Minutes of Exercise per Session: 0 min  Stress: No Stress Concern Present (09/01/2021)   [place]    Feeling of Stress : Not at all  Social Connections: Moderately Isolated (09/01/2021)   Social Connection and Isolation Panel [NHANES]    Frequency of Communication with Friends and Family: More than three times a week    Frequency of Social Gatherings with Friends and Family: More than three times a week    Attends Religious Services: More than 4 times per year    Active Member of [employer] or Organizations: No    Attends Archivist Meetings: Never    Marital Status: Widowed  Intimate Partner Violence: Not At Risk (09/01/2021)   Humiliation, Afraid, Rape, and Kick questionnaire    Fear of Current or Ex-Partner: No    Emotionally Abused: No    Physically Abused: No    Sexually Abused: No     BP 106/62   Pulse 63   Ht 5' 2.5" (1.588 m)   Wt 150 lb (68 kg)   SpO2 99%   BMI 27.00 kg/m   Physical Exam:  Well appearing NAD HEENT: Unremarkable Neck:  No JVD, no thyromegally Lymphatics:  No adenopathy Back:  No CVA tenderness Lungs:  Clear HEART:  Regular rate rhythm, no murmurs, no rubs, no clicks Abd:  soft, positive bowel sounds, no organomegally, no rebound, no guarding Ext:  2 plus pulses, no edema, no cyanosis, no clubbing Skin:  No rashes no nodules Neuro:  CN II through XII intact, motor grossly intact  EKG - revuewed, atrial flutter with a controlled VR  Assess/Plan:  Atrial flutter - I have discussed the  indications/risks/benefits/goals/expectations of EP study and catheter ablation and she wishes to proceed. She will be on eliquis until her ablation.   Carleene Overlie Breyonna Nault,MD

## 2022-04-24 ENCOUNTER — Other Ambulatory Visit: Payer: Medicare Other

## 2022-04-24 DIAGNOSIS — I483 Typical atrial flutter: Secondary | ICD-10-CM | POA: Diagnosis not present

## 2022-04-25 LAB — BASIC METABOLIC PANEL
BUN/Creatinine Ratio: 16 (ref 12–28)
BUN: 12 mg/dL (ref 8–27)
CO2: 20 mmol/L (ref 20–29)
Calcium: 9.1 mg/dL (ref 8.7–10.3)
Chloride: 105 mmol/L (ref 96–106)
Creatinine, Ser: 0.75 mg/dL (ref 0.57–1.00)
Glucose: 94 mg/dL (ref 70–99)
Potassium: 4.5 mmol/L (ref 3.5–5.2)
Sodium: 140 mmol/L (ref 134–144)
eGFR: 82 mL/min/{1.73_m2} (ref >59)

## 2022-04-25 LAB — CBC WITH DIFFERENTIAL/PLATELET
Basophils Absolute: 0 10*3/uL (ref 0.0–0.2)
Basos: 1 %
EOS (ABSOLUTE): 0.3 10*3/uL (ref 0.0–0.4)
Eos: 4 %
Hematocrit: 36.1 % (ref 34.0–46.6)
Hemoglobin: 11.6 g/dL (ref 11.1–15.9)
Immature Grans (Abs): 0 10*3/uL (ref 0.0–0.1)
Immature Granulocytes: 0 %
Lymphocytes Absolute: 1.4 10*3/uL (ref 0.7–3.1)
Lymphs: 20 %
MCH: 27.8 pg (ref 26.6–33.0)
MCHC: 32.1 g/dL (ref 31.5–35.7)
MCV: 87 fL (ref 79–97)
Monocytes Absolute: 0.6 10*3/uL (ref 0.1–0.9)
Monocytes: 9 %
Neutrophils Absolute: 4.6 10*3/uL (ref 1.4–7.0)
Neutrophils: 66 %
Platelets: 277 10*3/uL (ref 150–450)
RBC: 4.17 x10E6/uL (ref 3.77–5.28)
RDW: 12.7 % (ref 11.7–15.4)
WBC: 6.8 10*3/uL (ref 3.4–10.8)

## 2022-05-10 DIAGNOSIS — H524 Presbyopia: Secondary | ICD-10-CM | POA: Diagnosis not present

## 2022-05-10 DIAGNOSIS — M3501 Sicca syndrome with keratoconjunctivitis: Secondary | ICD-10-CM | POA: Diagnosis not present

## 2022-05-10 DIAGNOSIS — H35363 Drusen (degenerative) of macula, bilateral: Secondary | ICD-10-CM | POA: Diagnosis not present

## 2022-05-10 DIAGNOSIS — Z79899 Other long term (current) drug therapy: Secondary | ICD-10-CM | POA: Diagnosis not present

## 2022-05-16 NOTE — Pre-Procedure Instructions (Signed)
Instructed patient on the following items: Arrival time 0630 Nothing to eat or drink after midnight No meds AM of procedure Responsible person to drive you home and stay with you for 24 hrs  Have you missed any doses of anti-coagulant Eliquis- hasn't missed any doses.   

## 2022-05-16 NOTE — Anesthesia Preprocedure Evaluation (Signed)
Anesthesia Evaluation  Patient identified by MRN, date of birth, ID band Patient awake    Reviewed: Allergy & Precautions, NPO status , Patient's Chart, lab work & pertinent test results, reviewed documented beta blocker date and time   Airway Mallampati: I       Dental no notable dental hx.    Pulmonary asthma ,    Pulmonary exam normal        Cardiovascular hypertension, Pt. on home beta blockers and Pt. on medications + dysrhythmias Atrial Fibrillation  Rhythm:Regular Rate:Normal     Neuro/Psych negative neurological ROS  negative psych ROS   GI/Hepatic Neg liver ROS, GERD  Medicated and Controlled,  Endo/Other  negative endocrine ROS  Renal/GU negative Renal ROS     Musculoskeletal   Abdominal Normal abdominal exam  (+)   Peds  Hematology   Anesthesia Other Findings   Reproductive/Obstetrics                            Anesthesia Physical  Anesthesia Plan  ASA: II  Anesthesia Plan: General   Post-op Pain Management:    Induction: Intravenous  PONV Risk Score and Plan: 3 and Ondansetron, Dexamethasone and Treatment may vary due to age or medical condition  Airway Management Planned: Oral ETT  Additional Equipment: None  Intra-op Plan:   Post-operative Plan: Extubation in OR  Informed Consent: I have reviewed the patients History and Physical, chart, labs and discussed the procedure including the risks, benefits and alternatives for the proposed anesthesia with the patient or authorized representative who has indicated his/her understanding and acceptance.     Dental advisory given  Plan Discussed with: CRNA  Anesthesia Plan Comments:        Anesthesia Quick Evaluation

## 2022-05-17 ENCOUNTER — Ambulatory Visit (HOSPITAL_BASED_OUTPATIENT_CLINIC_OR_DEPARTMENT_OTHER): Payer: Medicare Other | Admitting: Anesthesiology

## 2022-05-17 ENCOUNTER — Ambulatory Visit (HOSPITAL_COMMUNITY)
Admission: RE | Admit: 2022-05-17 | Discharge: 2022-05-17 | Disposition: A | Discharge status: Home / Self Care | Payer: Medicare Other | Source: Ambulatory Visit | Attending: Internal Medicine | Admitting: Internal Medicine

## 2022-05-17 ENCOUNTER — Other Ambulatory Visit: Payer: Self-pay

## 2022-05-17 ENCOUNTER — Encounter (HOSPITAL_COMMUNITY): Payer: Self-pay | Admitting: Internal Medicine

## 2022-05-17 ENCOUNTER — Ambulatory Visit (HOSPITAL_COMMUNITY): Payer: Medicare Other | Admitting: Anesthesiology

## 2022-05-17 ENCOUNTER — Ambulatory Visit (HOSPITAL_COMMUNITY)
Admission: RE | Disposition: A | Discharge status: Home / Self Care | Payer: Medicare Other | Source: Ambulatory Visit | Attending: Internal Medicine

## 2022-05-17 DIAGNOSIS — I4891 Unspecified atrial fibrillation: Secondary | ICD-10-CM

## 2022-05-17 DIAGNOSIS — I483 Typical atrial flutter: Secondary | ICD-10-CM | POA: Diagnosis not present

## 2022-05-17 DIAGNOSIS — I4892 Unspecified atrial flutter: Secondary | ICD-10-CM | POA: Diagnosis not present

## 2022-05-17 DIAGNOSIS — J45909 Unspecified asthma, uncomplicated: Secondary | ICD-10-CM | POA: Diagnosis not present

## 2022-05-17 DIAGNOSIS — I10 Essential (primary) hypertension: Secondary | ICD-10-CM

## 2022-05-17 HISTORY — PX: A-FLUTTER ABLATION: EP1230

## 2022-05-17 SURGERY — A-FLUTTER ABLATION
Anesthesia: General

## 2022-05-17 MED ORDER — PHENYLEPHRINE 80 MCG/ML (10ML) SYRINGE FOR IV PUSH (FOR BLOOD PRESSURE SUPPORT)
PREFILLED_SYRINGE | INTRAVENOUS | Status: DC | PRN
  Administered 2022-05-17: 160 ug via INTRAVENOUS

## 2022-05-17 MED ORDER — SODIUM CHLORIDE 0.9 % IV SOLN: 250.0000 mL | INTRAVENOUS | Status: DC | PRN

## 2022-05-17 MED ORDER — PROPOFOL 10 MG/ML IV BOLUS
INTRAVENOUS | Status: DC | PRN
  Administered 2022-05-17: 150 mg via INTRAVENOUS

## 2022-05-17 MED ORDER — OXYCODONE HCL 5 MG PO TABS
5.0000 mg | ORAL_TABLET | Freq: Once | ORAL | Status: AC | PRN
  Administered 2022-05-17: 5 mg via ORAL

## 2022-05-17 MED ORDER — DEXAMETHASONE SODIUM PHOSPHATE 10 MG/ML IJ SOLN
INTRAMUSCULAR | Status: DC | PRN
  Administered 2022-05-17: 4 mg via INTRAVENOUS

## 2022-05-17 MED ORDER — ONDANSETRON HCL 4 MG/2ML IJ SOLN: 4.0000 mg | Freq: Four times a day (QID) | INTRAMUSCULAR | Status: DC | PRN

## 2022-05-17 MED ORDER — SODIUM CHLORIDE 0.9 % IV SOLN: INTRAVENOUS | Status: DC

## 2022-05-17 MED ORDER — FENTANYL CITRATE (PF) 100 MCG/2ML IJ SOLN
INTRAMUSCULAR | Status: DC | PRN
  Administered 2022-05-17: 100 ug via INTRAVENOUS

## 2022-05-17 MED ORDER — HEPARIN SODIUM (PORCINE) 1000 UNIT/ML IJ SOLN
INTRAMUSCULAR | Status: AC
  Filled 2022-05-17: qty 10

## 2022-05-17 MED ORDER — PHENYLEPHRINE HCL-NACL 20-0.9 MG/250ML-% IV SOLN
INTRAVENOUS | Status: DC | PRN
  Administered 2022-05-17: 25 ug/min via INTRAVENOUS

## 2022-05-17 MED ORDER — HEPARIN (PORCINE) IN NACL 1000-0.9 UT/500ML-% IV SOLN
INTRAVENOUS | Status: DC | PRN
  Administered 2022-05-17: 500 mL

## 2022-05-17 MED ORDER — ONDANSETRON HCL 4 MG/2ML IJ SOLN
INTRAMUSCULAR | Status: DC | PRN
  Administered 2022-05-17: 4 mg via INTRAVENOUS

## 2022-05-17 MED ORDER — ACETAMINOPHEN 325 MG PO TABS
650.0000 mg | ORAL_TABLET | ORAL | Status: DC | PRN
  Administered 2022-05-17: 650 mg via ORAL
  Filled 2022-05-17: qty 2

## 2022-05-17 MED ORDER — SODIUM CHLORIDE 0.9% FLUSH: 3.0000 mL | INTRAVENOUS | Status: DC | PRN

## 2022-05-17 MED ORDER — ROCURONIUM BROMIDE 10 MG/ML (PF) SYRINGE
PREFILLED_SYRINGE | INTRAVENOUS | Status: DC | PRN
  Administered 2022-05-17: 40 mg via INTRAVENOUS

## 2022-05-17 MED ORDER — LIDOCAINE 2% (20 MG/ML) 5 ML SYRINGE
INTRAMUSCULAR | Status: DC | PRN
  Administered 2022-05-17: 100 mg via INTRAVENOUS

## 2022-05-17 MED ORDER — OXYCODONE HCL 5 MG PO TABS
ORAL_TABLET | ORAL | Status: AC
  Filled 2022-05-17: qty 1

## 2022-05-17 MED ORDER — OXYCODONE HCL 5 MG/5ML PO SOLN: 5.0000 mg | Freq: Once | ORAL | Status: AC | PRN

## 2022-05-17 MED ORDER — HYDRALAZINE HCL 20 MG/ML IJ SOLN
INTRAMUSCULAR | Status: AC
  Filled 2022-05-17: qty 1

## 2022-05-17 MED ORDER — HYDRALAZINE HCL 20 MG/ML IJ SOLN
10.0000 mg | Freq: Once | INTRAMUSCULAR | Status: AC
  Administered 2022-05-17: 10 mg via INTRAVENOUS

## 2022-05-17 MED ORDER — HEPARIN (PORCINE) IN NACL 1000-0.9 UT/500ML-% IV SOLN
INTRAVENOUS | Status: AC
  Filled 2022-05-17: qty 500

## 2022-05-17 MED ORDER — SUGAMMADEX SODIUM 200 MG/2ML IV SOLN
INTRAVENOUS | Status: DC | PRN
  Administered 2022-05-17: 150 mg via INTRAVENOUS

## 2022-05-17 SURGICAL SUPPLY — 10 items
BAG SNAP BAND KOVER 36X36 (MISCELLANEOUS) ×1 IMPLANT
CATH POLARIS X REPROCESSED (CATHETERS) ×1 IMPLANT
CATH SMTCH THERMOCOOL SF FJ (CATHETERS) ×1 IMPLANT
PACK EP LATEX FREE (CUSTOM PROCEDURE TRAY) ×2
PACK EP LF (CUSTOM PROCEDURE TRAY) ×1 IMPLANT
PAD DEFIB RADIO PHYSIO CONN (PAD) ×2 IMPLANT
PATCH CARTO3 (PAD) ×1 IMPLANT
SHEATH PINNACLE 7F 10CM (SHEATH) ×1 IMPLANT
SHEATH PINNACLE 8F 10CM (SHEATH) ×1 IMPLANT
TUBING SMART ABLATE COOLFLOW (TUBING) ×1 IMPLANT

## 2022-05-17 NOTE — Progress Notes (Addendum)
SITE AREA: right femoral/groin  SITE PRIOR TO REMOVAL:  LEVEL 0  PRESSURE APPLIED FOR: approximately 10 minutes  MANUAL: yes  PATIENT STATUS DURING PULL: stable, decreased bp, but remained asymptomatic  POST PULL SITE:  LEVEL 0  POST PULL INSTRUCTIONS GIVEN: yes  POST PULL PULSES PRESENT: bilateral pedal pulses at +2  DRESSING APPLIED: gauze with tegaderm  BEDREST BEGINS @ 1112   COMMENTS:  see flowsheets for VS

## 2022-05-17 NOTE — Discharge Instructions (Addendum)
Post procedure care instructions No driving for 4 days. No lifting over 5 lbs for 1 week. No vigorous or sexual activity for 1 week. You may return to work/your usual activities on 05/25/22. Keep procedure site clean & dry. If you notice increased pain, swelling, bleeding or pus, call/return!  You may shower after 24 hours, but no soaking in baths/hot tubs/pools for 1 week.    Cardiac Ablation, Care After  This sheet gives you information about how to care for yourself after your procedure. Your health care provider may also give you more specific instructions. If you have problems or questions, contact your health care provider. What can I expect after the procedure? After the procedure, it is common to have: Bruising around your puncture site. Tenderness around your puncture site. Skipped heartbeats. Tiredness (fatigue).  Follow these instructions at home: Puncture site care  Follow instructions from your health care provider about how to take care of your puncture site. Make sure you: If present, leave stitches (sutures), skin glue, or adhesive strips in place. These skin closures may need to stay in place for up to 2 weeks. If adhesive strip edges start to loosen and curl up, you may trim the loose edges. Do not remove adhesive strips completely unless your health care provider tells you to do that. If a large square bandage is present, this may be removed 24 hours after surgery.  Check your puncture site every day for signs of infection. Check for: Redness, swelling, or pain. Fluid or blood. If your puncture site starts to bleed, lie down on your back, apply firm pressure to the area, and contact your health care provider. Warmth. Pus or a bad smell. A pea or small marble sized lump at the site is normal and can take up to three months to resolve.  Driving Do not drive for at least 4 days after your procedure or however long your health care provider recommends. (Do not resume driving  if you have previously been instructed not to drive for other health reasons.) Do not drive or use heavy machinery while taking prescription pain medicine. Activity Avoid activities that take a lot of effort for at least 7 days after your procedure. Do not lift anything that is heavier than 5 lb (4.5 kg) for one week.  No sexual activity for 1 week.  Return to your normal activities as told by your health care provider. Ask your health care provider what activities are safe for you. General instructions Take over-the-counter and prescription medicines only as told by your health care provider. Do not use any products that contain nicotine or tobacco, such as cigarettes and e-cigarettes. If you need help quitting, ask your health care provider. You may shower after 24 hours, but Do not take baths, swim, or use a hot tub for 1 week.  Do not drink alcohol for 24 hours after your procedure. Keep all follow-up visits as told by your health care provider. This is important. Contact a health care provider if: You have redness, mild swelling, or pain around your puncture site. You have fluid or blood coming from your puncture site that stops after applying firm pressure to the area. Your puncture site feels warm to the touch. You have pus or a bad smell coming from your puncture site. You have a fever. You have chest pain or discomfort that spreads to your neck, jaw, or arm. You are sweating a lot. You feel nauseous. You have a fast or irregular heartbeat.  You have shortness of breath. You are dizzy or light-headed and feel the need to lie down. You have pain or numbness in the arm or leg closest to your puncture site. Get help right away if: Your puncture site suddenly swells. Your puncture site is bleeding and the bleeding does not stop after applying firm pressure to the area. These symptoms may represent a serious problem that is an emergency. Do not wait to see if the symptoms will go away.  Get medical help right away. Call your local emergency services (911 in the [place] Do not drive yourself to the hospital. Summary After the procedure, it is normal to have bruising and tenderness at the puncture site in your groin, neck, or forearm. Check your puncture site every day for signs of infection. Get help right away if your puncture site is bleeding and the bleeding does not stop after applying firm pressure to the area. This is a medical emergency. This information is not intended to replace advice given to you by your health care provider. Make sure you discuss any questions you have with your health care provider.

## 2022-05-17 NOTE — Progress Notes (Signed)
Up and walked and tolerated well; right groin stable, no bleeding or hematoma 

## 2022-05-17 NOTE — Anesthesia Postprocedure Evaluation (Signed)
Anesthesia Post Note  Patient: Denise Payne  Procedure(s) Performed: A-FLUTTER ABLATION     Patient location during evaluation: Cath Lab Anesthesia Type: General Level of consciousness: awake Pain management: pain level controlled Vital Signs Assessment: post-procedure vital signs reviewed and stable Respiratory status: spontaneous breathing Cardiovascular status: stable Postop Assessment: no apparent nausea or vomiting Anesthetic complications: no   There were no known notable events for this encounter.  Last Vitals:  Vitals:   05/17/22 1020 05/17/22 1025  BP: (!) 156/72 (!) 175/62  Pulse: 68 64  Resp: 15 16  Temp: 36.7 C   SpO2: 98% 96%    Last Pain:  Vitals:   05/17/22 1020  TempSrc: Temporal  PainSc: [place]

## 2022-05-17 NOTE — Anesthesia Procedure Notes (Signed)
Procedure Name: Intubation Date/Time: 05/17/2022 8:46 AM  Performed by: Wilburn Cornelia, CRNAPre-anesthesia Checklist: Patient identified, Emergency Drugs available, Suction available, Patient being monitored and Timeout performed Patient Re-evaluated:Patient Re-evaluated prior to induction Oxygen Delivery Method: Circle system utilized Preoxygenation: Pre-oxygenation with 100% oxygen Induction Type: IV induction Ventilation: Mask ventilation without difficulty Laryngoscope Size: Mac and 3 Grade View: Grade IV Tube type: Oral Tube size: 7.0 mm Number of attempts: 2 Airway Equipment and Method: Stylet Placement Confirmation: ETT inserted through vocal cords under direct vision, positive ETCO2, CO2 detector and breath sounds checked- equal and bilateral Secured at: 21 cm Tube secured with: Tape Dental Injury: Teeth and Oropharynx as per pre-operative assessment

## 2022-05-17 NOTE — Interval H&P Note (Signed)
History and Physical Interval Note:  05/17/2022 7:35 AM  Denise Payne  has presented today for surgery, with the diagnosis of aflutter.  The various methods of treatment have been discussed with the patient and family. After consideration of risks, benefits and other options for treatment, the patient has consented to  Procedure(s): A-FLUTTER ABLATION (N/A) as a surgical intervention.  The patient's history has been reviewed, patient examined, no change in status, stable for surgery.  I have reviewed the patient's chart and labs.  Questions were answered to the patient's satisfaction.     [name]

## 2022-05-17 NOTE — Progress Notes (Signed)
Patient and sister was given discharge instructions. Both verbalized understanding. 

## 2022-05-17 NOTE — Transfer of Care (Signed)
Immediate Anesthesia Transfer of Care Note  Patient: Denise Payne  Procedure(s) Performed: A-FLUTTER ABLATION  Patient Location: Cath Lab  Anesthesia Type:General  Level of Consciousness: awake, alert  and oriented  Airway & Oxygen Therapy: Patient Spontanous Breathing  Post-op Assessment: Report given to RN and Post -op Vital signs reviewed and stable  Post vital signs: Reviewed and stable  Last Vitals:  Vitals Value Taken Time  BP 156/72 05/17/22 1020  Temp    Pulse 66 05/17/22 1020  Resp 13 05/17/22 1020  SpO2 100 % 05/17/22 1020  Vitals shown include unvalidated device data.  Last Pain:  Vitals:   05/17/22 0652  TempSrc:   PainSc: 0-No pain         Complications: There were no known notable events for this encounter.

## 2022-05-18 ENCOUNTER — Encounter (HOSPITAL_COMMUNITY): Payer: Self-pay | Admitting: Internal Medicine

## 2022-05-18 MED FILL — Heparin Sodium (Porcine) Inj 1000 Unit/ML: INTRAMUSCULAR | Qty: 10 | Status: AC

## 2022-06-12 ENCOUNTER — Other Ambulatory Visit: Payer: Self-pay | Admitting: Family Medicine

## 2022-06-26 ENCOUNTER — Ambulatory Visit (INDEPENDENT_AMBULATORY_CARE_PROVIDER_SITE_OTHER): Payer: Medicare Other | Admitting: Family Medicine

## 2022-06-26 ENCOUNTER — Encounter: Payer: Self-pay | Admitting: Family Medicine

## 2022-06-26 ENCOUNTER — Ambulatory Visit (INDEPENDENT_AMBULATORY_CARE_PROVIDER_SITE_OTHER)
Admission: RE | Admit: 2022-06-26 | Discharge: 2022-06-26 | Disposition: A | Discharge status: Home / Self Care | Payer: Medicare Other | Source: Ambulatory Visit | Attending: Family Medicine | Admitting: Family Medicine

## 2022-06-26 VITALS — BP 131/66 | HR 63 | Temp 97.5°F | Wt 147.4 lb

## 2022-06-26 DIAGNOSIS — Z23 Encounter for immunization: Secondary | ICD-10-CM | POA: Diagnosis not present

## 2022-06-26 DIAGNOSIS — E785 Hyperlipidemia, unspecified: Secondary | ICD-10-CM | POA: Diagnosis not present

## 2022-06-26 DIAGNOSIS — I483 Typical atrial flutter: Secondary | ICD-10-CM

## 2022-06-26 DIAGNOSIS — I10 Essential (primary) hypertension: Secondary | ICD-10-CM | POA: Diagnosis not present

## 2022-06-26 DIAGNOSIS — M25531 Pain in right wrist: Secondary | ICD-10-CM

## 2022-06-26 DIAGNOSIS — M3500 Sicca syndrome, unspecified: Secondary | ICD-10-CM | POA: Diagnosis not present

## 2022-06-26 MED ORDER — ESOMEPRAZOLE MAGNESIUM 40 MG PO CPDR: 40.0000 mg | DELAYED_RELEASE_CAPSULE | Freq: Every day | ORAL | 3 refills | Status: DC

## 2022-06-26 MED ORDER — ROSUVASTATIN CALCIUM 10 MG PO TABS: 10.0000 mg | ORAL_TABLET | Freq: Every day | ORAL | 3 refills | Status: DC

## 2022-06-26 NOTE — Assessment & Plan Note (Signed)
On Plaquenil.  Symptoms are stable.

## 2022-06-26 NOTE — Assessment & Plan Note (Signed)
On Crestor 10 mg daily.  Last lipids at goal.  We will recheck again in 6 months.

## 2022-06-26 NOTE — Assessment & Plan Note (Signed)
Doing well status post ablation.  She is having some peripheral edema over the last few months.  She will follow-up with her cardiologist soon to discuss.  She will continue anticoagulation for now as well as diltiazem 180 mg daily metoprolol tartrate 25 mg twice daily.

## 2022-06-26 NOTE — Patient Instructions (Signed)
It was very nice to see you today!  We will get an x-ray of your wrist to rule out fracture.  We may need to have you follow-up with your hand surgeon ASAP depending on results.  I will see you back in 6 months.  Please come back to see me sooner if needed.  Take care, Dr Jerline Pain  PLEASE NOTE:  If you had any lab tests please let us know if you have not heard back within a few days. You may see your results on mychart before we have a chance to review them but we will give you a call once they are reviewed by [place]. If we ordered any referrals today, please let us know if you have not heard from their office within the next week.   Please try these tips to maintain a healthy lifestyle:  Eat at least 3 REAL meals and 1-2 snacks per day.  Aim for no more than 5 hours between eating.  If you eat breakfast, please do so within one hour of getting up.   Each meal should contain half fruits/vegetables, one quarter protein, and one quarter carbs (no bigger than a computer mouse)  Cut down on sweet beverages. This includes juice, soda, and sweet tea.   Drink at least 1 glass of water with each meal and aim for at least 8 glasses per day  Exercise at least 150 minutes every week.

## 2022-06-26 NOTE — Progress Notes (Signed)
   Denise Payne is a 76 y.o. female who presents today for an office visit.  Assessment/Plan:  New/Acute Problems: Right Wrist Pain Concern for fracture.  Will check plain film.  She has splint in place.  She does have a hand surgeon from a previous fracture couple years ago and likely will need to follow-up with them depending on the result.  Chronic Problems Addressed Today: Dyslipidemia On Crestor 10 mg daily.  Last lipids at goal.  We will recheck again in 6 months.  Atrial flutter ([place]) Doing well status post ablation.  She is having some peripheral edema over the last few months.  She will follow-up with her cardiologist soon to discuss.  She will continue anticoagulation for now as well as diltiazem 180 mg daily metoprolol tartrate 25 mg twice daily.  Benign essential HTN At goal on diltiazem 180 mg daily metoprolol tartrate 25 mg twice daily.  Sjogren's syndrome ([place]) On Plaquenil.  Symptoms are stable.  Flu shot given today    Subjective:  HPI:  See A/p for status of chronic conditions.   She fell while in [place] a few weeks while walking into her hotel. She went to the local emergency department and was admitted for observation. She had imaging done which did not show any obvious fractures. Over the last few weeks she has had persistently pain in her right wrist since then.         Objective:  Physical Exam: BP 131/66   Pulse 63   Temp (!) 97.5 F (36.4 C) (Temporal)   Wt 147 lb 6.4 oz (66.9 kg)   SpO2 98%   BMI 26.53 kg/m   Gen: No acute distress, resting comfortably MSK: Deformity in right wrist noted.  Tenderness palpation along distal ulna. Neuro: Grossly normal, moves all extremities Psych: Normal affect and thought content      Daniela Hernan M. Jerline Pain, MD 06/26/2022 1:58 PM

## 2022-06-26 NOTE — Assessment & Plan Note (Signed)
At goal on diltiazem 180 mg daily metoprolol tartrate 25 mg twice daily.

## 2022-06-27 ENCOUNTER — Telehealth: Payer: Self-pay | Admitting: Family Medicine

## 2022-06-27 NOTE — Telephone Encounter (Signed)
Patient requests to be called at ph# [phone] to given results from XRAY of wrist done on 06/26/22

## 2022-06-28 ENCOUNTER — Telehealth: Payer: Self-pay | Admitting: Internal Medicine

## 2022-06-28 DIAGNOSIS — S52571A Other intraarticular fracture of lower end of right radius, initial encounter for closed fracture: Secondary | ICD-10-CM | POA: Diagnosis not present

## 2022-06-28 DIAGNOSIS — M25531 Pain in right wrist: Secondary | ICD-10-CM | POA: Diagnosis not present

## 2022-06-28 NOTE — Telephone Encounter (Signed)
   [facility] Pre-operative Risk Assessment    Request for surgical clearance:  What type of surgery is being performed?  Open Reduction Internal Fixation w/ Bone Graft  When is this surgery scheduled?  07/05/22   What type of clearance is required (medical clearance vs. Pharmacy clearance to hold med vs. Both)?  Medical   Are there any medications that need to be held prior to surgery and how long? No    Practice name and name of physician performing surgery?  Dr. Lowella Petties of [facility]   What is your office phone number? [phone]    7.   What is your office fax number? [phone]  8.   Anesthesia type (None, local, MAC, general) ?  Ancillary Block w/ MAC   Denise Payne 06/28/2022, 3:37 PM

## 2022-06-28 NOTE — Progress Notes (Signed)
Please inform patient of the following:  Xray confirms fractures in her wrist at the ulna and radius. Recommend she follow up with her hand surgeon ASAP as we discussed at her office visit.  Algis Greenhouse. Jerline Pain, MD 06/28/2022 7:23 AM

## 2022-06-28 NOTE — Telephone Encounter (Signed)
See results note. 

## 2022-06-28 NOTE — Telephone Encounter (Signed)
I s/w the pt and she is has been scheduled to see Ambrose Pancoast, NP on 07/03/22 @ 1:55 at [facility]. Pt is grateful for the call and the help. Pt broke her wrist while in [place].

## 2022-06-28 NOTE — Telephone Encounter (Signed)
   Name: Denise Payne  DOB: 09-Feb-1946  MRN: [id number]  Primary Cardiologist: Sanda Klein, MD  Chart reviewed as part of pre-operative protocol coverage. Because of [name]'s past medical history and time since last visit, she will require a follow-up in-office visit in order to better assess preoperative cardiovascular risk.  Pre-op covering staff: - Please schedule appointment and call patient to inform them. If patient already had an upcoming appointment within acceptable timeframe, please add "pre-op clearance" to the appointment notes so provider is aware. - Please contact requesting surgeon's office via preferred method (i.e, phone, fax) to inform them of need for appointment prior to surgery.  This message will also be routed to pharmacy pool for input on holding Eliquis  so that this information is available to the clearing provider at time of patient's appointment.   Lenna Sciara, NP  06/28/2022, 5:19 PM

## 2022-06-29 ENCOUNTER — Encounter: Payer: Self-pay | Admitting: Nurse Practitioner

## 2022-06-29 ENCOUNTER — Ambulatory Visit: Payer: Medicare Other | Attending: Nurse Practitioner | Admitting: Nurse Practitioner

## 2022-06-29 VITALS — BP 122/64 | HR 66 | Ht 62.0 in | Wt 146.0 lb

## 2022-06-29 DIAGNOSIS — Z8679 Personal history of other diseases of the circulatory system: Secondary | ICD-10-CM | POA: Diagnosis not present

## 2022-06-29 DIAGNOSIS — Z9889 Other specified postprocedural states: Secondary | ICD-10-CM | POA: Insufficient documentation

## 2022-06-29 DIAGNOSIS — Z01810 Encounter for preprocedural cardiovascular examination: Secondary | ICD-10-CM | POA: Insufficient documentation

## 2022-06-29 DIAGNOSIS — I483 Typical atrial flutter: Secondary | ICD-10-CM | POA: Diagnosis not present

## 2022-06-29 DIAGNOSIS — I10 Essential (primary) hypertension: Secondary | ICD-10-CM | POA: Diagnosis not present

## 2022-06-29 NOTE — Progress Notes (Signed)
Office Visit    Patient Name: Denise Payne Date of Encounter: 06/29/2022  Primary Care Provider:  Vivi Barrack, MD Primary Cardiologist:  Sanda Klein, MD  Chief Complaint    76 year old female with a history of atrial flutter s/p ablation in 05/2022, hypertension, cataract, Sjogren's syndrome, GERD, anemia, and asthma who presents for follow-up related to atrial flutter and for preoperative cardiac evaluation.  Past Medical History    Past Medical History:  Diagnosis Date   Anemia    Asthma    Cataract    GERD (gastroesophageal reflux disease)    Osteoporosis    Sjogren's syndrome ([place]) 01/26/2019   Past Surgical History:  Procedure Laterality Date   A-FLUTTER ABLATION N/A 05/17/2022   Procedure: A-FLUTTER ABLATION;  Surgeon: Evans Lance, MD;  Location: [facility];  Service: Cardiovascular;  Laterality: N/A;   ABDOMINAL HYSTERECTOMY  1996   ANKLE FRACTURE SURGERY  2005   AUGMENTATION MAMMAPLASTY Bilateral 10/16/1976   CHOLECYSTECTOMY N/A 12/07/2020   Procedure: LAPAROSCOPIC CHOLECYSTECTOMY WITH INTRAOPERATIVE CHOLANGIOGRAM;  Surgeon: Kieth Brightly Arta Bruce, MD;  Location: [facility];  Service: General;  Laterality: N/A;   CYSTOSCOPY WITH STENT PLACEMENT N/A 05/22/2019   Procedure: CYSTOSCOPY WITH STENT PLACEMENT;  Surgeon: Irine Seal, MD;  Location: [facility];  Service: Urology;  Laterality: N/A;   FLEXIBLE SIGMOIDOSCOPY N/A 05/22/2019   Procedure: FLEXIBLE SIGMOIDOSCOPY;  Surgeon: Ileana Roup, MD;  Location: [facility];  Service: General;  Laterality: N/A;   XI ROBOTIC ASSISTED LOWER ANTERIOR RESECTION Bilateral 05/22/2019   Procedure: XI ROBOTIC ASSISTED SIGMOIDECTOMY;  Surgeon: Ileana Roup, MD;  Location: [facility];  Service: General;  Laterality: Bilateral;    Allergies  Allergies  Allergen Reactions   Latex Shortness Of Breath and Swelling    Lip and eye swelling   Meperidine Hcl Diarrhea and Nausea And Vomiting   Morphine And Related Other (See  Comments)    Makes her cramp in abdomen    Onion Diarrhea and Other (See Comments)    Stomach cramps    History of Present Illness    76 year old female with the above past medical history including atrial flutter s/p ablation in 05/2022, hypertension, cataract, Sjogren's syndrome, GERD, anemia, and asthma.  She was being evaluated for eye surgery and was noted to be in atrial flutter with CVR/RVR. She was asymptomatic at the time.  She was started on Eliquis.  She was scheduled to undergo TEE guided DCCV but spontaneously converted to NSR.  At her follow-up visit in 03/2022 she was noted to have recurrent atrial flutter.  She was asymptomatic at the time.  He was last seen in the office on 04/21/2022 and remained in atrial flutter.  Due to the fact that she takes Plaquenil, which interacts with all antiarrhythmics, she underwent typical a flutter ablation on 05/17/2022.  She broke her wrist while traveling to [place].  She is now pending ORIF of the R wrist/hand with bone graft on 07/05/2022 with Dr. Burney Gauze of the [facility].    She presents today for follow-up and for preoperative cardiac evaluation.  Since her procedure she has done well from a cardiac standpoint.  She is maintaining NSR.  She denies any palpitations, dyspnea, dizziness, presyncope, syncope.  She denies bleeding on Eliquis.  She remains active and has traveled internationally without difficulty. She is eager to proceed with her wrist surgery.  Additionally, she notes she will be having eyelid surgery in October 2023 will require clearance for  that procedure as well.  Otherwise, she reports feeling well denies any additional concerns today.    Home Medications    Current Outpatient Medications  Medication Sig Dispense Refill   acetaminophen (TYLENOL) 500 MG tablet Take 1,000 mg by mouth every 6 (six) hours as needed for mild pain.     apixaban (ELIQUIS) 5 MG TABS tablet Take 1 tablet (5 mg total) by mouth 2 (two)  times daily. 180 tablet 1   Biotin 10000 MCG TABS Take 10,000 mcg by mouth daily.     Carboxymeth-Glyc-Polysorb PF (REFRESH OPTIVE MEGA-3) 0.5-1-0.5 % SOLN Place 1 drop into both eyes 4 (four) times daily as needed (dry eyes).     Cholecalciferol (VITAMIN D) 50 MCG (2000 UT) tablet Take 2,000 Units by mouth daily.     cholestyramine (QUESTRAN) 4 g packet Take 1 packet (4 g total) by mouth 3 (three) times daily with meals. (Patient taking differently: Take 4 g by mouth 3 (three) times daily as needed (diarrhea).) 60 each 12   cyclobenzaprine (FLEXERIL) 10 MG tablet Take 1 tablet (10 mg total) by mouth 3 (three) times daily as needed for muscle spasms. 90 tablet 0   diclofenac sodium (VOLTAREN) 1 % GEL Apply 2 g topically 2 (two) times daily as needed (pain).      diltiazem (CARDIZEM CD) 180 MG 24 hr capsule Take 1 capsule (180 mg total) by mouth daily. 90 capsule 3   esomeprazole (NEXIUM) 40 MG capsule Take 1 capsule (40 mg total) by mouth daily. 90 capsule 3   hydroxychloroquine (PLAQUENIL) 200 MG tablet Take 200 mg by mouth daily.     Melatonin 10 MG TABS Take 10 mg by mouth at bedtime as needed (sleep).      METAMUCIL FIBER PO Take 1 Scoop by mouth daily.     metoprolol tartrate (LOPRESSOR) 25 MG tablet Take 1 tablet (25 mg total) by mouth 2 (two) times daily. 180 tablet 3   montelukast (SINGULAIR) 10 MG tablet TAKE 1 TABLET AT BEDTIME 90 tablet 3   OVER THE COUNTER MEDICATION Place 1 Application into both eyes at bedtime. Optase Hylo night eye ointment     Probiotic Product (PROBIOTIC PO) Take 1 tablet by mouth daily.     pseudoephedrine (SUDAFED) 120 MG 12 hr tablet Take 120 mg by mouth daily as needed for congestion.     rosuvastatin (CRESTOR) 10 MG tablet Take 1 tablet (10 mg total) by mouth daily. 90 tablet 3   vitamin B-12 (CYANOCOBALAMIN) 1000 MCG tablet Take 1,000 mcg by mouth daily.      No current facility-administered medications for this visit.     Review of Systems    She  denies chest pain, palpitations, dyspnea, pnd, orthopnea, n, v, dizziness, syncope, edema, weight gain, or early satiety. All other systems reviewed and are otherwise negative except as noted above.   Physical Exam    VS:  BP 122/64 (BP Location: Left Arm, Patient Position: Sitting, Cuff Size: Normal)   Pulse 66   Ht '5\' 2"'$  (1.575 m)   Wt 146 lb (66.2 kg)   BMI 26.70 kg/m   GEN: Well nourished, well developed, in no acute distress. HEENT: normal. Neck: Supple, no JVD, carotid bruits, or masses. Cardiac: RRR, no murmurs, rubs, or gallops. No clubbing, cyanosis, edema.  Radials/DP/PT 2+ and equal bilaterally. R femoral site without bruising, bleeding, or hematoma.  Respiratory:  Respirations regular and unlabored, clear to auscultation bilaterally. GI: Soft, nontender, nondistended, BS + x  4. MS: no deformity or atrophy. Splint to R wrist. Skin: warm and dry, no rash. Neuro:  Strength and sensation are intact. Psych: Normal affect.  Accessory Clinical Findings    ECG personally reviewed by me today -NSR, 66 bpm- no acute changes.   Lab Results  Component Value Date   WBC 6.8 04/24/2022   HGB 11.6 04/24/2022   HCT 36.1 04/24/2022   MCV 87 04/24/2022   PLT 277 04/24/2022   Lab Results  Component Value Date   CREATININE 0.75 04/24/2022   BUN 12 04/24/2022   NA 140 04/24/2022   K 4.5 04/24/2022   CL 105 04/24/2022   CO2 20 04/24/2022   Lab Results  Component Value Date   ALT 12 01/25/2022   AST 15 01/25/2022   ALKPHOS 62 01/25/2022   BILITOT 0.8 01/25/2022   Lab Results  Component Value Date   CHOL 150 12/27/2021   HDL 72.90 12/27/2021   LDLCALC 54 12/27/2021   TRIG 113.0 12/27/2021   CHOLHDL 2 12/27/2021    Lab Results  Component Value Date   HGBA1C 5.2 12/27/2021    Assessment & Plan    1. Atrial flutter: S/p ablation in 05/2022.  Maintaining NSR.  I will reach out to Dr. Lovena Le to see if he plans to continue diltiazem and/or Eliquis. For now, continue  diltiazem, metoprolol, Eliquis.  Addendum 06/30/2022: Per Dr. Lovena Le, okay to discontinue Eliquis and diltiazem at this time.  I notified the patient of Dr. Tanna Furry recommendations.  She verbalized understanding.  2. Hypertension: BP well controlled. Continue current antihypertensive regimen.   3. Preoperative cardiac exam: According to the Revised Cardiac Risk Index (RCRI), her Perioperative Risk of Major Cardiac Event is (%): 0.4. Her Functional Capacity in METs is: 7.99 according to the Duke Activity Status Index (DASI).  Per Dr. Burney Gauze, no need to hold Eliquis for upcoming surgery. Therefore, based on ACC/AHA guidelines, patient would be at acceptable risk for the planned procedure without further cardiovascular testing. Patient is also planning on having bilateral eyelid surgery in October 2023. I do not see an updated clearance request for that procedure at this time. From a cardiac standpoint, she should be cleared to proceed with that surgery as well. However, we will likely need updated recommendations for hold Eliquis prior to that procedure. I will route this recommendation to the requesting party via Epic fax function.  4. Disposition: Follow-up in 3-4 months with Dr. Lovena Le.  She is possibly interested in switching her cardiologist from Dr. Sallyanne Kuster to Dr. [name]. Can discuss further at next follow-up visit.      Lenna Sciara, NP 06/29/2022, 4:30 PM

## 2022-06-29 NOTE — Telephone Encounter (Signed)
The patient had successful atrial flutter ablation on 05/17/2022. No ablation was performed on the left side of the heart. Anticoagulation is important for 30 days following the atrial flutter ablation. To date, atrial fibrillation has not been identified.  Has always had typical counterclockwise atrial flutter. If desired Eliquis can be stopped for 2-3 days before the surgical procedure.  I do not think Lovenox "bridge" is necessary in this situation. Will defer to Dr. Lovena Le whether Eliquis should be stopped long-term.

## 2022-06-29 NOTE — Telephone Encounter (Signed)
Per pre op provider Melina Copa, PAC asked to move appt up sooner for pt due to need to hold anticoag meds for surgery. Pt tells me that she was told she did not need to hold her Eliquis for the surgery. I assured her that provider today will go over all recommendations with her. Pt said thank you for the help. Pt agreeable to appt 06/29/22 @ 2:45 with Diona Browner, PAC at [facility] location. Pt has been given address for [facility] location.

## 2022-06-29 NOTE — Telephone Encounter (Signed)
Per pre op provider to forward to device clinic as well for device clearance.

## 2022-06-29 NOTE — Patient Instructions (Signed)
Medication Instructions:  No Changes *If you need a refill on your cardiac medications before your next appointment, please call your pharmacy*   Lab Work: No Labs If you have labs (blood work) drawn today and your tests are completely normal, you will receive your results only by: [place] (if you have MyChart) OR A paper copy in the mail If you have any lab test that is abnormal or we need to change your treatment, we will call you to review the results.   Testing/Procedures: No Testing   Follow-Up: At [facility], you and your health needs are our priority.  As part of our continuing mission to provide you with exceptional heart care, we have created designated Provider Care Teams.  These Care Teams include your primary Cardiologist (physician) and Advanced Practice Providers (APPs -  Physician Assistants and Nurse Practitioners) who all work together to provide you with the care you need, when you need it.  We recommend signing up for the patient portal called "MyChart".  Sign up information is provided on this After Visit Summary.  MyChart is used to connect with patients for Virtual Visits (Telemedicine).  Patients are able to view lab/test results, encounter notes, upcoming appointments, etc.  Non-urgent messages can be sent to your provider as well.   To learn more about what you can do with MyChart, go to [email or web address].    Your next appointment:   3-4 month(s)  The format for your next appointment:   In Person  Provider:   [name], MD

## 2022-06-29 NOTE — Telephone Encounter (Addendum)
   Patient Name: Denise Payne  DOB: [birth year] MRN: [id number]  Primary Cardiologist: Sanda Klein, MD  Chart reviewed as part of pre-operative protocol coverage.   Patient has pre-op appt scheduled 9/18 but likely will be needing to hold anticoagulation before then. PharmD reviewing plans with primary cardiologist - routed to Dr. Sallyanne Kuster, awaiting reply below. Will route to callback to see if we can please move her follow-up appointment up so that she can be seen for pre-op clearance in enough time to also be able to hold anticoagulation for procedure. The provider seeing the patient can then review final recs and route note to requesting party.  Will retain in preop box until we know that appt can be moved up.   Charlie Pitter, PA-C 06/29/2022, 8:43 AM

## 2022-06-29 NOTE — Telephone Encounter (Signed)
Per pre op provide to call the surgeon office to confirm if Eliquis needs to be held, per pt states she was told it did not need to be held.   Per Suanne Marker with Dr. Burney Gauze office, states Dr. Burney Gauze did tell the pt that she does not need to hold Eliquis. Suanne Marker, stated  if the cardiologist does feel Eliquis needs to be held they will follow the recommendations from cardiology. I thanked Suanne Marker for the help and that I will update the pre op provider and the the [facility] who will be seeing the pt today.

## 2022-06-29 NOTE — Telephone Encounter (Addendum)
No need for device input (ablation only). Arbie Cookey told device clinic in person to disregard.

## 2022-06-29 NOTE — Telephone Encounter (Addendum)
Patient with diagnosis of A flutter on Eliquis for anticoagulation.  Patient had ablation on 05/17/22   Procedure: Open Reduction Internal Fixation w/ Bone Graft Date of procedure: 07/05/22   CHA2DS2-VASc Score = 5  This indicates a 7.2% annual risk of stroke. The patient's score is based upon: CHF History: 0 HTN History: 1 Diabetes History: 0 Stroke History: 0 Vascular Disease History: 1 Age Score: 2 Gender Score: 1    CrCl 66 mL/min Platelet count 277K  Since patient had ablation on 05/18/22, may require Lovenox bridge for procedure.  Likely need a 3 day Eliquis hold.  Will route to cardiologist.  **This guidance is not considered finalized until pre-operative APP has relayed final recommendations.**

## 2022-06-30 NOTE — Addendum Note (Signed)
Addended by: Diona Browner C on: 06/30/2022 01:01 PM   Modules accepted: Orders

## 2022-07-03 ENCOUNTER — Ambulatory Visit: Payer: Medicare Other | Admitting: Nurse Practitioner

## 2022-07-05 ENCOUNTER — Ambulatory Visit: Payer: Medicare Other | Admitting: Internal Medicine

## 2022-07-05 DIAGNOSIS — S52571K Other intraarticular fracture of lower end of right radius, subsequent encounter for closed fracture with nonunion: Secondary | ICD-10-CM | POA: Diagnosis not present

## 2022-07-05 DIAGNOSIS — S52571P Other intraarticular fracture of lower end of right radius, subsequent encounter for closed fracture with malunion: Secondary | ICD-10-CM | POA: Diagnosis not present

## 2022-07-10 DIAGNOSIS — S52571A Other intraarticular fracture of lower end of right radius, initial encounter for closed fracture: Secondary | ICD-10-CM | POA: Diagnosis not present

## 2022-07-12 DIAGNOSIS — S52571A Other intraarticular fracture of lower end of right radius, initial encounter for closed fracture: Secondary | ICD-10-CM | POA: Diagnosis not present

## 2022-07-12 DIAGNOSIS — M25631 Stiffness of right wrist, not elsewhere classified: Secondary | ICD-10-CM | POA: Diagnosis not present

## 2022-07-12 DIAGNOSIS — M25531 Pain in right wrist: Secondary | ICD-10-CM | POA: Diagnosis not present

## 2022-07-14 ENCOUNTER — Telehealth: Payer: Self-pay

## 2022-07-14 NOTE — Telephone Encounter (Signed)
   Name: Denise Payne  DOB: 12-Nov-1945  MRN: [id number]   Primary Cardiologist: Sanda Klein, MD  Chart reviewed as part of pre-operative protocol coverage. Patient was contacted 07/14/2022 in reference to pre-operative risk assessment for pending surgery as outlined below.  Denise Payne was last seen on 06/29/2022 by Diona Browner, NP.  Since that day, Denise Payne has done well from a cardiac standpoint.  She denies any new symptoms or concerns.  She is able to complete greater than 4 METS without difficulty.  Per Dr. Lovena Le, she is no longer taking Eliquis.  Therefore, based on ACC/AHA guidelines, the patient would be at acceptable risk for the planned procedure without further cardiovascular testing.   The patient was advised that if she develops new symptoms prior to surgery to contact our office to arrange for a follow-up visit, and she verbalized understanding.  I will route this recommendation to the requesting party via Epic fax function and remove from pre-op pool. Please call with questions.  Lenna Sciara, NP 07/14/2022, 5:04 PM

## 2022-07-14 NOTE — Telephone Encounter (Signed)
   Pre-operative Risk Assessment    Patient Name: Denise Payne  DOB: 1946-07-26 MRN: [id number]     Request for Surgical Clearance    Procedure:  Blepharoplasty, Both upper lids- functional CEA  Date of Surgery:  Clearance 07/19/22                                 Surgeon:  Dr. Delia Chimes  Surgeon's Group or Practice Name: [facility]  Phone number:  [phone] ex [phone] Fax number:  [phone]   Type of Clearance Requested:   - Medical    Type of Anesthesia:  Not Indicated   Additional requests/questions:    Denise Payne   07/14/2022, 4:52 PM

## 2022-07-18 DIAGNOSIS — M25631 Stiffness of right wrist, not elsewhere classified: Secondary | ICD-10-CM | POA: Diagnosis not present

## 2022-07-18 DIAGNOSIS — S52571A Other intraarticular fracture of lower end of right radius, initial encounter for closed fracture: Secondary | ICD-10-CM | POA: Diagnosis not present

## 2022-07-18 DIAGNOSIS — M25531 Pain in right wrist: Secondary | ICD-10-CM | POA: Diagnosis not present

## 2022-07-19 DIAGNOSIS — H02831 Dermatochalasis of right upper eyelid: Secondary | ICD-10-CM | POA: Diagnosis not present

## 2022-07-19 DIAGNOSIS — H02834 Dermatochalasis of left upper eyelid: Secondary | ICD-10-CM | POA: Diagnosis not present

## 2022-08-03 DIAGNOSIS — S52571A Other intraarticular fracture of lower end of right radius, initial encounter for closed fracture: Secondary | ICD-10-CM | POA: Diagnosis not present

## 2022-08-22 DIAGNOSIS — E663 Overweight: Secondary | ICD-10-CM | POA: Diagnosis not present

## 2022-08-22 DIAGNOSIS — M5136 Other intervertebral disc degeneration, lumbar region: Secondary | ICD-10-CM | POA: Diagnosis not present

## 2022-08-22 DIAGNOSIS — M25561 Pain in right knee: Secondary | ICD-10-CM | POA: Diagnosis not present

## 2022-08-22 DIAGNOSIS — Z6827 Body mass index (BMI) 27.0-27.9, adult: Secondary | ICD-10-CM | POA: Diagnosis not present

## 2022-08-22 DIAGNOSIS — R5383 Other fatigue: Secondary | ICD-10-CM | POA: Diagnosis not present

## 2022-08-22 DIAGNOSIS — M1991 Primary osteoarthritis, unspecified site: Secondary | ICD-10-CM | POA: Diagnosis not present

## 2022-08-22 DIAGNOSIS — M3500 Sicca syndrome, unspecified: Secondary | ICD-10-CM | POA: Diagnosis not present

## 2022-08-24 DIAGNOSIS — S52571A Other intraarticular fracture of lower end of right radius, initial encounter for closed fracture: Secondary | ICD-10-CM | POA: Diagnosis not present

## 2022-08-24 DIAGNOSIS — M79642 Pain in left hand: Secondary | ICD-10-CM | POA: Diagnosis not present

## 2022-08-30 DIAGNOSIS — H35363 Drusen (degenerative) of macula, bilateral: Secondary | ICD-10-CM | POA: Diagnosis not present

## 2022-08-30 DIAGNOSIS — H04123 Dry eye syndrome of bilateral lacrimal glands: Secondary | ICD-10-CM | POA: Diagnosis not present

## 2022-08-30 DIAGNOSIS — Z79899 Other long term (current) drug therapy: Secondary | ICD-10-CM | POA: Diagnosis not present

## 2022-09-14 ENCOUNTER — Ambulatory Visit (INDEPENDENT_AMBULATORY_CARE_PROVIDER_SITE_OTHER): Payer: Medicare Other

## 2022-09-14 VITALS — Wt 144.0 lb

## 2022-09-14 DIAGNOSIS — Z0000 Encounter for general adult medical examination without abnormal findings: Secondary | ICD-10-CM

## 2022-09-14 NOTE — Patient Instructions (Signed)
[name] , Thank you for taking time to come for your Medicare Wellness Visit. I appreciate your ongoing commitment to your health goals. Please review the following plan we discussed and let me know if I can assist you in the future.   These are the goals we discussed:  Goals      Patient Stated     Lose weight     Patient Stated     Lose another 20 lbs      Patient Stated     Lose weight         This is a list of the screening recommended for you and due dates:  Health Maintenance  Topic Date Due   COVID-19 Vaccine (6 - 2023-24 season) 06/16/2022   Medicare Annual Wellness Visit  09/15/2023   DTaP/Tdap/Td vaccine (2 - Td or Tdap) 04/25/2026   Pneumonia Vaccine  Completed   Flu Shot  Completed   DEXA scan (bone density measurement)  Completed   Hepatitis C Screening: USPSTF Recommendation to screen - Ages 24-79 yo.  Completed   Zoster (Shingles) Vaccine  Completed   HPV Vaccine  Aged Out    Advanced directives: Please bring a copy of your health care power of attorney and living will to the office at your convenience.   Conditions/risks identified: lose 20 lbs   Next appointment: Follow up in one year for your annual wellness visit    Preventive Care 65 Years and Older, Female Preventive care refers to lifestyle choices and visits with your health care provider that can promote health and wellness. What does preventive care include? A yearly physical exam. This is also called an annual well check. Dental exams once or twice a year. Routine eye exams. Ask your health care provider how often you should have your eyes checked. Personal lifestyle choices, including: Daily care of your teeth and gums. Regular physical activity. Eating a healthy diet. Avoiding tobacco and drug use. Limiting alcohol use. Practicing safe sex. Taking low-dose aspirin every day. Taking vitamin and mineral supplements as recommended by your health care provider. What happens during an  annual well check? The services and screenings done by your health care provider during your annual well check will depend on your age, overall health, lifestyle risk factors, and family history of disease. Counseling  Your health care provider may ask you questions about your: Alcohol use. Tobacco use. Drug use. Emotional well-being. Home and relationship well-being. Sexual activity. Eating habits. History of falls. Memory and ability to understand (cognition). Work and work Statistician. Reproductive health. Screening  You may have the following tests or measurements: Height, weight, and BMI. Blood pressure. Lipid and cholesterol levels. These may be checked every 5 years, or more frequently if you are over 2 years old. Skin check. Lung cancer screening. You may have this screening every year starting at age 7 if you have a 30-pack-year history of smoking and currently smoke or have quit within the past 15 years. Fecal occult blood test (FOBT) of the stool. You may have this test every year starting at age 73. Flexible sigmoidoscopy or colonoscopy. You may have a sigmoidoscopy every 5 years or a colonoscopy every 10 years starting at age 54. Hepatitis C blood test. Hepatitis B blood test. Sexually transmitted disease (STD) testing. Diabetes screening. This is done by checking your blood sugar (glucose) after you have not eaten for a while (fasting). You may have this done every 1-3 years. Bone density scan. This is done  to screen for osteoporosis. You may have this done starting at age 46. Mammogram. This may be done every 1-2 years. Talk to your health care provider about how often you should have regular mammograms. Talk with your health care provider about your test results, treatment options, and if necessary, the need for more tests. Vaccines  Your health care provider may recommend certain vaccines, such as: Influenza vaccine. This is recommended every year. Tetanus,  diphtheria, and acellular pertussis (Tdap, Td) vaccine. You may need a Td booster every 10 years. Zoster vaccine. You may need this after age 5. Pneumococcal 13-valent conjugate (PCV13) vaccine. One dose is recommended after age 63. Pneumococcal polysaccharide (PPSV23) vaccine. One dose is recommended after age 26. Talk to your health care provider about which screenings and vaccines you need and how often you need them. This information is not intended to replace advice given to you by your health care provider. Make sure you discuss any questions you have with your health care provider. Document Released: 10/29/2015 Document Revised: 06/21/2016 Document Reviewed: 08/03/2015 Elsevier Interactive Patient Education  2017 [employer] Prevention in the Home Falls can cause injuries. They can happen to people of all ages. There are many things you can do to make your home safe and to help prevent falls. What can I do on the outside of my home? Regularly fix the edges of walkways and driveways and fix any cracks. Remove anything that might make you trip as you walk through a door, such as a raised step or threshold. Trim any bushes or trees on the path to your home. Use bright outdoor lighting. Clear any walking paths of anything that might make someone trip, such as rocks or tools. Regularly check to see if handrails are loose or broken. Make sure that both sides of any steps have handrails. Any raised decks and porches should have guardrails on the edges. Have any leaves, snow, or ice cleared regularly. Use sand or salt on walking paths during winter. Clean up any spills in your garage right away. This includes oil or grease spills. What can I do in the bathroom? Use night lights. Install grab bars by the toilet and in the tub and shower. Do not use towel bars as grab bars. Use non-skid mats or decals in the tub or shower. If you need to sit down in the shower, use a plastic,  non-slip stool. Keep the floor dry. Clean up any water that spills on the floor as soon as it happens. Remove soap buildup in the tub or shower regularly. Attach bath mats securely with double-sided non-slip rug tape. Do not have throw rugs and other things on the floor that can make you trip. What can I do in the bedroom? Use night lights. Make sure that you have a light by your bed that is easy to reach. Do not use any sheets or blankets that are too big for your bed. They should not hang down onto the floor. Have a firm chair that has side arms. You can use this for support while you get dressed. Do not have throw rugs and other things on the floor that can make you trip. What can I do in the kitchen? Clean up any spills right away. Avoid walking on wet floors. Keep items that you use a lot in easy-to-reach places. If you need to reach something above you, use a strong step stool that has a grab bar. Keep electrical cords out of the  way. Do not use floor polish or wax that makes floors slippery. If you must use wax, use non-skid floor wax. Do not have throw rugs and other things on the floor that can make you trip. What can I do with my stairs? Do not leave any items on the stairs. Make sure that there are handrails on both sides of the stairs and use them. Fix handrails that are broken or loose. Make sure that handrails are as long as the stairways. Check any carpeting to make sure that it is firmly attached to the stairs. Fix any carpet that is loose or worn. Avoid having throw rugs at the top or bottom of the stairs. If you do have throw rugs, attach them to the floor with carpet tape. Make sure that you have a light switch at the top of the stairs and the bottom of the stairs. If you do not have them, ask someone to add them for you. What else can I do to help prevent falls? Wear shoes that: Do not have high heels. Have rubber bottoms. Are comfortable and fit you well. Are closed  at the toe. Do not wear sandals. If you use a stepladder: Make sure that it is fully opened. Do not climb a closed stepladder. Make sure that both sides of the stepladder are locked into place. Ask someone to hold it for you, if possible. Clearly mark and make sure that you can see: Any grab bars or handrails. First and last steps. Where the edge of each step is. Use tools that help you move around (mobility aids) if they are needed. These include: Canes. Walkers. Scooters. Crutches. Turn on the lights when you go into a dark area. Replace any light bulbs as soon as they burn out. Set up your furniture so you have a clear path. Avoid moving your furniture around. If any of your floors are uneven, fix them. If there are any pets around you, be aware of where they are. Review your medicines with your doctor. Some medicines can make you feel dizzy. This can increase your chance of falling. Ask your doctor what other things that you can do to help prevent falls. This information is not intended to replace advice given to you by your health care provider. Make sure you discuss any questions you have with your health care provider. Document Released: 07/29/2009 Document Revised: 03/09/2016 Document Reviewed: 11/06/2014 Elsevier Interactive Patient Education  2017 [employer].

## 2022-09-14 NOTE — Progress Notes (Signed)
I connected with  Denise Payne on 09/14/22 by a audio enabled telemedicine application and verified that I am speaking with the correct person using two identifiers.  Patient Location: Home  Provider Location: Office/Clinic  I discussed the limitations of evaluation and management by telemedicine. The patient expressed understanding and agreed to proceed.   Subjective:   Denise Payne is a 76 y.o. female who presents for Medicare Annual (Subsequent) preventive examination.  Review of Systems     Cardiac Risk Factors include: advanced age (>30mn, >>29women);dyslipidemia;hypertension     Objective:    Today's Vitals   09/14/22 1308  Weight: 144 lb (65.3 kg)   Body mass index is 26.34 kg/m.     09/14/2022    1:13 PM 05/17/2022    6:53 AM 01/25/2022   11:19 PM 01/25/2022    5:52 PM 09/01/2021    1:16 PM 01/29/2021    6:40 PM 12/06/2020    4:04 PM  Advanced Directives  Does Patient Have a Medical Advance Directive? Yes Yes No No Yes No;Yes Yes  Type of AParamedicof [place]Living will HVeyoLiving will   Healthcare Power of [place]Living will Living will  Does patient want to make changes to medical advance directive?  No - Patient declined     No - Patient declined  Copy of [place] Chart? No - copy requested No - copy requested   No - copy requested    Would patient like information on creating a medical advance directive?   No - Patient declined        Current Medications (verified) Outpatient Encounter Medications as of 09/14/2022  Medication Sig   acetaminophen (TYLENOL) 500 MG tablet Take 1,000 mg by mouth every 6 (six) hours as needed for mild pain.   Biotin 10000 MCG TABS Take 10,000 mcg by mouth daily.   Carboxymeth-Glyc-Polysorb PF (REFRESH OPTIVE MEGA-3) 0.5-1-0.5 % SOLN Place 1 drop into both eyes 4 (four) times daily as needed (dry eyes).   Cholecalciferol  (VITAMIN D) 50 MCG (2000 UT) tablet Take 2,000 Units by mouth daily.   cholestyramine (QUESTRAN) 4 g packet Take 1 packet (4 g total) by mouth 3 (three) times daily with meals. (Patient taking differently: Take 4 g by mouth 3 (three) times daily as needed (diarrhea).)   cyclobenzaprine (FLEXERIL) 10 MG tablet Take 1 tablet (10 mg total) by mouth 3 (three) times daily as needed for muscle spasms.   diclofenac sodium (VOLTAREN) 1 % GEL Apply 2 g topically 2 (two) times daily as needed (pain).    esomeprazole (NEXIUM) 40 MG capsule Take 1 capsule (40 mg total) by mouth daily.   hydroxychloroquine (PLAQUENIL) 200 MG tablet Take 200 mg by mouth daily.   Melatonin 10 MG TABS Take 10 mg by mouth at bedtime as needed (sleep).    METAMUCIL FIBER PO Take 1 Scoop by mouth daily.   metoprolol tartrate (LOPRESSOR) 25 MG tablet Take 1 tablet (25 mg total) by mouth 2 (two) times daily.   montelukast (SINGULAIR) 10 MG tablet TAKE 1 TABLET AT BEDTIME   OVER THE COUNTER MEDICATION Place 1 Application into both eyes at bedtime. Optase Hylo night eye ointment   Probiotic Product (PROBIOTIC PO) Take 1 tablet by mouth daily.   rosuvastatin (CRESTOR) 10 MG tablet Take 1 tablet (10 mg total) by mouth daily.   vitamin B-12 (CYANOCOBALAMIN) 1000 MCG tablet Take 1,000 mcg by mouth daily.  pseudoephedrine (SUDAFED) 120 MG 12 hr tablet Take 120 mg by mouth daily as needed for congestion. (Patient not taking: Reported on 09/14/2022)   No facility-administered encounter medications on file as of 09/14/2022.    Allergies (verified) Latex, Meperidine hcl, Morphine and related, and Onion   History: Past Medical History:  Diagnosis Date   Anemia    Asthma    Cataract    GERD (gastroesophageal reflux disease)    Osteoporosis    Sjogren's syndrome ([place]) 01/26/2019   Past Surgical History:  Procedure Laterality Date   A-FLUTTER ABLATION N/A 05/17/2022   Procedure: A-FLUTTER ABLATION;  Surgeon: Evans Lance, MD;   Location: [facility];  Service: Cardiovascular;  Laterality: N/A;   ABDOMINAL HYSTERECTOMY  1996   ANKLE FRACTURE SURGERY  2005   AUGMENTATION MAMMAPLASTY Bilateral 10/16/1976   CHOLECYSTECTOMY N/A 12/07/2020   Procedure: LAPAROSCOPIC CHOLECYSTECTOMY WITH INTRAOPERATIVE CHOLANGIOGRAM;  Surgeon: Kieth Brightly Arta Bruce, MD;  Location: [facility];  Service: General;  Laterality: N/A;   CYSTOSCOPY WITH STENT PLACEMENT N/A 05/22/2019   Procedure: CYSTOSCOPY WITH STENT PLACEMENT;  Surgeon: Irine Seal, MD;  Location[facility];  Service: Urology;  Laterality: N/A;   FLEXIBLE SIGMOIDOSCOPY N/A 05/22/2019   Procedure: FLEXIBLE SIGMOIDOSCOPY;  Surgeon: Ileana Roup, MD;  [facility];  Service: General;  Laterality: N/A;   XI ROBOTIC ASSISTED LOWER ANTERIOR RESECTION Bilateral 05/22/2019   Procedure: XI ROBOTIC ASSISTED SIGMOIDECTOMY;  Surgeon: Ileana Roup, MD;  [facility];  Service: General;  Laterality: Bilateral;   Family History  Problem Relation Age of Onset   Breast cancer Mother 22   Hearing loss Mother    Heart disease Mother    High Cholesterol Mother    Hypertension Mother    Cancer Father    Hearing loss Father    Stroke Father    Transient ischemic attack Father    Arthritis Maternal Grandmother    Cancer Maternal Grandmother    Early death Maternal Grandmother    Diabetes Maternal Grandfather    Early death Maternal Grandfather    Hearing loss Maternal Grandfather    Heart attack Maternal Grandfather    Heart disease Maternal Grandfather    Arthritis Paternal Grandmother    Early death Paternal Grandmother    Hearing loss Paternal Grandmother    Hyperlipidemia Paternal Grandmother    Early death Paternal Grandfather    Kidney disease Paternal 30    Cancer Sister    Hyperlipidemia Sister    Hypertension Sister    Birth defects Brother    Cancer Brother    Hyperlipidemia Brother    Social History   Socioeconomic History   Marital status:  Widowed    Spouse name: Not on file   Number of children: Not on file   Years of education: Not on file   Highest education level: Not on file  Occupational History   Occupation: retired  Tobacco Use   Smoking status: Never   Smokeless tobacco: Never  Vaping Use   Vaping Use: Never used  Substance and Sexual Activity   Alcohol use: Yes    Alcohol/week: 8.0 standard drinks of alcohol    Types: 8 Glasses of wine per week    Comment: 3 times daily   Drug use: Never   Sexual activity: Not Currently  Other Topics Concern   Not on file  Social History Narrative   Not on file   Social Determinants of Health   Financial Resource Strain: Low  Risk  (09/01/2021)   Overall Financial Resource Strain (CARDIA)    Difficulty of Paying Living Expenses: Not hard at all  Food Insecurity: No Food Insecurity (09/14/2022)   Hunger Vital Sign    Worried About Running Out of Food in the Last Year: Never true    Ran Out of Food in the Last Year: Never true  Transportation Needs: No Transportation Needs (09/14/2022)   PRAPARE - Hydrologist (Medical): No    Lack of Transportation (Non-Medical): No  Physical Activity: Inactive (09/14/2022)   Exercise Vital Sign    Days of Exercise per Week: 0 days    Minutes of Exercise per Session: 0 min  Stress: No Stress Concern Present (09/14/2022)   [place]    Feeling of Stress : Not at all  Social Connections: Moderately Isolated (09/14/2022)   Social Connection and Isolation Panel [NHANES]    Frequency of Communication with Friends and Family: More than three times a week    Frequency of Social Gatherings with Friends and Family: More than three times a week    Attends Religious Services: More than 4 times per year    Active Member of [employer] or Organizations: No    Attends Archivist Meetings: Never    Marital Status: Widowed    Tobacco  Counseling Counseling given: Not Answered   Clinical Intake:  Pre-visit preparation completed: Yes  Pain : No/denies pain     Diabetes: No  How often do you need to have someone help you when you read instructions, pamphlets, or other written materials from your doctor or pharmacy?: 1 - Never  Diabetic?no  Interpreter Needed?: No  Information entered by :: Charlott Rakes, LPN   Activities of Daily Living    09/14/2022    1:14 PM 01/25/2022   11:21 PM  In your present state of health, do you have any difficulty performing the following activities:  Hearing? 1   Comment wears hearing aids   Vision? 0   Difficulty concentrating or making decisions? 0   Walking or climbing stairs? 0   Dressing or bathing? 0   Doing errands, shopping? 0 0  Preparing Food and eating ? N   Using the Toilet? N   In the past six months, have you accidently leaked urine? Y   Comment at times   Do you have problems with loss of bowel control? N   Managing your Medications? N   Managing your Finances? N   Housekeeping or managing your Housekeeping? N     Patient Care Team: Vivi Barrack, MD as PCP - General (Family Medicine) Croitoru, Dani Gobble, MD as PCP - Cardiology (Cardiology)  Indicate any recent Medical Services you may have received from other than [facility] providers in the past year (date may be approximate).     Assessment:   This is a routine wellness examination for Allysia.  Hearing/Vision screen Hearing Screening - Comments:: Pt wears hearing aids  Vision Screening - Comments:: Pt follows up with [facility] for annul eye exams   Dietary issues and exercise activities discussed: Current Exercise Habits: The patient does not participate in regular exercise at present   Goals Addressed             This Visit's Progress    Patient Stated       Lose weight        Depression Screen    09/14/2022  1:12 PM 06/26/2022    1:31 PM 02/03/2022    9:22 AM 12/27/2021    11:03 AM 09/01/2021    1:14 PM 06/27/2021    2:36 PM 08/26/2020    1:14 PM  PHQ 2/9 Scores  PHQ - 2 Score 0 0 0 0 0 0 0  PHQ- 9 Score    0       Fall Risk    09/14/2022    1:13 PM 06/26/2022    1:31 PM 02/03/2022    9:22 AM 12/27/2021   10:55 AM 09/01/2021    1:17 PM  Fall Risk   Falls in the past year? 1 1 0 0 0  Number falls in past yr: 1 0 0 0 0  Injury with Fall? 1 0 0 0 0  Comment fracture right wrist      Risk for fall due to : Impaired vision No Fall Risks History of fall(s) No Fall Risks Impaired vision  Follow up Falls prevention discussed  Education provided Falls evaluation completed Falls prevention discussed    FALL RISK PREVENTION PERTAINING TO THE HOME:  Any stairs in or around the home? Yes  If so, are there any without handrails? No  Home free of loose throw rugs in walkways, pet beds, electrical cords, etc? Yes  Adequate lighting in your home to reduce risk of falls? Yes   ASSISTIVE DEVICES UTILIZED TO PREVENT FALLS:  Life alert? No  Use of a cane, walker or w/c? No  Grab bars in the bathroom? No  Shower chair or bench in shower? No  Elevated toilet seat or a handicapped toilet? No   TIMED UP AND GO:  Was the test performed? No .    Cognitive Function:        09/14/2022    1:15 PM 09/01/2021    1:20 PM 08/26/2020    1:20 PM  6CIT Screen  What Year? 0 points 0 points 0 points  What month? 0 points 0 points 0 points  What time? 0 points 0 points   Count back from 20 0 points 0 points 0 points  Months in reverse 0 points 0 points 0 points  Repeat phrase 0 points 0 points 0 points  Total Score 0 points 0 points     Immunizations Immunization History  Administered Date(s) Administered   Fluad Quad(high Dose 65+) 06/27/2021, 06/26/2022   Hep A / Hep B 02/13/2022, 03/14/2022   Influenza, High Dose Seasonal PF 01/26/2019   Influenza-Unspecified 08/04/2020   PFIZER(Purple Top)SARS-COV-2 Vaccination 11/05/2019, 11/26/2019, 05/29/2020,  01/13/2021, 09/14/2021   Pneumococcal Conjugate-13 10/28/2015   Pneumococcal Polysaccharide-23 12/27/2021   Tdap 04/25/2016   Typhoid Inactivated 04/24/2022   Yellow Fever 04/24/2022   Zoster Recombinat (Shingrix) 02/04/2022, 04/01/2022    TDAP status: Up to date  Flu Vaccine status: Up to date  Pneumococcal vaccine status: Up to date  Covid-19 vaccine status: Completed vaccines  Qualifies for Shingles Vaccine? Yes   Zostavax completed Yes   Shingrix Completed?: Yes  Screening Tests Health Maintenance  Topic Date Due   COVID-19 Vaccine (6 - 2023-24 season) 06/16/2022   Medicare Annual Wellness (AWV)  09/15/2023   DTaP/Tdap/Td (2 - Td or Tdap) 04/25/2026   Pneumonia Vaccine 29+ Years old  Completed   INFLUENZA VACCINE  Completed   DEXA SCAN  Completed   Hepatitis C Screening  Completed   Zoster Vaccines- Shingrix  Completed   HPV [place]  Maintenance Due  Topic Date Due   COVID-19 Vaccine (6 - 2023-24 season) 06/16/2022    Colorectal cancer screening: No longer required.   Mammogram status: Completed 05/27/20. Repeat every year pt stated she will follow up after her trip to [place] status: Completed 05/26/20. Results reflect: Bone density results: OSTEOPENIA. Repeat every 2 years.   Additional Screening:  Hepatitis C Screening:  Completed 12/15/20  Vision Screening: Recommended annual ophthalmology exams for early detection of glaucoma and other disorders of the eye. Is the patient up to date with their annual eye exam?  Yes  Who is the provider or what is the name of the office in which the patient attends annual eye exams? [facility]  If pt is not established with a provider, would they like to be referred to a provider to establish care? No .   Dental Screening: Recommended annual dental exams for proper oral hygiene  Community Resource Referral / Chronic Care Management: CRR required this visit?  No    CCM required this visit?  No      Plan:     I have personally reviewed and noted the following in the patient's chart:   Medical and social history Use of alcohol, tobacco or illicit drugs  Current medications and supplements including opioid prescriptions. Patient is not currently taking opioid prescriptions. Functional ability and status Nutritional status Physical activity Advanced directives List of other physicians Hospitalizations, surgeries, and ER visits in previous 12 months Vitals Screenings to include cognitive, depression, and falls Referrals and appointments  In addition, I have reviewed and discussed with patient certain preventive protocols, quality metrics, and best practice recommendations. A written personalized care plan for preventive services as well as general preventive health recommendations were provided to patient.     Willette Brace, LPN   [id number]/38/[id number]   Nurse Notes: none

## 2022-09-21 DIAGNOSIS — M65332 Trigger finger, left middle finger: Secondary | ICD-10-CM | POA: Diagnosis not present

## 2022-09-21 DIAGNOSIS — S52571A Other intraarticular fracture of lower end of right radius, initial encounter for closed fracture: Secondary | ICD-10-CM | POA: Diagnosis not present

## 2022-09-29 ENCOUNTER — Ambulatory Visit: Payer: Medicare Other | Attending: Internal Medicine | Admitting: Internal Medicine

## 2022-09-29 ENCOUNTER — Encounter: Payer: Self-pay | Admitting: Internal Medicine

## 2022-09-29 VITALS — BP 142/62 | HR 55 | Ht 62.5 in | Wt 146.8 lb

## 2022-09-29 DIAGNOSIS — I4892 Unspecified atrial flutter: Secondary | ICD-10-CM | POA: Diagnosis not present

## 2022-09-29 MED ORDER — METOPROLOL TARTRATE 25 MG PO TABS: 25.0000 mg | ORAL_TABLET | Freq: Two times a day (BID) | ORAL | 3 refills | Status: DC

## 2022-09-29 NOTE — Patient Instructions (Addendum)
Medication Instructions:  Your physician recommends that you continue on your current medications as directed. Please refer to the Current Medication list given to you today.  *If you need a refill on your cardiac medications before your next appointment, please call your pharmacy*  Lab Work: None ordered.  If you have labs (blood work) drawn today and your tests are completely normal, you will receive your results only by: [place] (if you have MyChart) OR A paper copy in the mail If you have any lab test that is abnormal or we need to change your treatment, we will call you to review the results.  Testing/Procedures: None ordered.  Follow-Up: At [facility], you and your health needs are our priority.  As part of our continuing mission to provide you with exceptional heart care, we have created designated Provider Care Teams.  These Care Teams include your primary Cardiologist (physician) and Advanced Practice Providers (APPs -  Physician Assistants and Nurse Practitioners) who all work together to provide you with the care you need, when you need it.  We recommend signing up for the patient portal called "MyChart".  Sign up information is provided on this After Visit Summary.  MyChart is used to connect with patients for Virtual Visits (Telemedicine).  Patients are able to view lab/test results, encounter notes, upcoming appointments, etc.  Non-urgent messages can be sent to your provider as well.   To learn more about what you can do with MyChart, go to [email or web address].    Your next appointment:   Please schedule a follow up appointment with Dr. [name] in June of 2024.  Thank you!  No follow up with Dr. Lovena Le required at this time:  AS NEEDED  The format for your next appointment:   In Person  Provider:   [name], MD{or one of the following Advanced Practice Providers on your designated Care Team:   [name], [name] "Jonni Sanger" Chalmers Cater,  [place]   Important Information About Sugar

## 2022-09-29 NOTE — Progress Notes (Signed)
HPI Denise Payne is referred for ongoing followup. She is a pleasant 76 yo woman with a h/o atrial flutter s/p catheter ablation. She had an episode of palpitations and her watch showed atrial fibrillation. She has not had syncope.  Allergies  Allergen Reactions   Latex Shortness Of Breath and Swelling    Lip and eye swelling   Meperidine Hcl Diarrhea and Nausea And Vomiting   Morphine And Related Other (See Comments)    Makes her cramp in abdomen    Onion Diarrhea and Other (See Comments)    Stomach cramps     Current Outpatient Medications  Medication Sig Dispense Refill   acetaminophen (TYLENOL) 500 MG tablet Take 1,000 mg by mouth every 6 (six) hours as needed for mild pain.     Biotin 10000 MCG TABS Take 10,000 mcg by mouth daily.     Carboxymeth-Glyc-Polysorb PF (REFRESH OPTIVE MEGA-3) 0.5-1-0.5 % SOLN Place 1 drop into both eyes 4 (four) times daily as needed (dry eyes).     Cholecalciferol (VITAMIN D) 50 MCG (2000 UT) tablet Take 2,000 Units by mouth daily.     cholestyramine (QUESTRAN) 4 g packet Take 1 packet (4 g total) by mouth 3 (three) times daily with meals. (Patient taking differently: Take 4 g by mouth 3 (three) times daily as needed (diarrhea).) 60 each 12   cyclobenzaprine (FLEXERIL) 10 MG tablet Take 1 tablet (10 mg total) by mouth 3 (three) times daily as needed for muscle spasms. 90 tablet 0   diclofenac sodium (VOLTAREN) 1 % GEL Apply 2 g topically 2 (two) times daily as needed (pain).      esomeprazole (NEXIUM) 40 MG capsule Take 1 capsule (40 mg total) by mouth daily. 90 capsule 3   hydroxychloroquine (PLAQUENIL) 200 MG tablet Take 200 mg by mouth daily.     Melatonin 10 MG TABS Take 10 mg by mouth at bedtime as needed (sleep).      METAMUCIL FIBER PO Take 1 Scoop by mouth daily.     montelukast (SINGULAIR) 10 MG tablet TAKE 1 TABLET AT BEDTIME 90 tablet 3   OVER THE COUNTER MEDICATION Place 1 Application into both eyes at bedtime. Optase Hylo night  eye ointment     Probiotic Product (PROBIOTIC PO) Take 1 tablet by mouth daily.     rosuvastatin (CRESTOR) 10 MG tablet Take 1 tablet (10 mg total) by mouth daily. 90 tablet 3   vitamin B-12 (CYANOCOBALAMIN) 1000 MCG tablet Take 1,000 mcg by mouth daily.      metoprolol tartrate (LOPRESSOR) 25 MG tablet Take 1 tablet (25 mg total) by mouth 2 (two) times daily. 180 tablet 3   No current facility-administered medications for this visit.     Past Medical History:  Diagnosis Date   Anemia    Asthma    Cataract    GERD (gastroesophageal reflux disease)    Osteoporosis    Sjogren's syndrome ([place]) 01/26/2019    ROS:   All systems reviewed and negative except as noted in the HPI.   Past Surgical History:  Procedure Laterality Date   A-FLUTTER ABLATION N/A 05/17/2022   Procedure: A-FLUTTER ABLATION;  Surgeon: Evans Lance, MD;  Location: [facility];  Service: Cardiovascular;  Laterality: N/A;   ABDOMINAL HYSTERECTOMY  1996   ANKLE FRACTURE SURGERY  2005   AUGMENTATION MAMMAPLASTY Bilateral 10/16/1976   CHOLECYSTECTOMY N/A 12/07/2020   Procedure: LAPAROSCOPIC CHOLECYSTECTOMY WITH INTRAOPERATIVE CHOLANGIOGRAM;  Surgeon: Mickeal Skinner,  MD;  Location: [facility];  Service: General;  Laterality: N/A;   CYSTOSCOPY WITH STENT PLACEMENT N/A 05/22/2019   Procedure: CYSTOSCOPY WITH STENT PLACEMENT;  Surgeon: Irine Seal, MD;  [facility];  Service: Urology;  Laterality: N/A;   FLEXIBLE SIGMOIDOSCOPY N/A 05/22/2019   Procedure: FLEXIBLE SIGMOIDOSCOPY;  Surgeon: Ileana Roup, MD;  [facility];  Service: General;  Laterality: N/A;   XI ROBOTIC ASSISTED LOWER ANTERIOR RESECTION Bilateral 05/22/2019   Procedure: XI ROBOTIC ASSISTED SIGMOIDECTOMY;  Surgeon: Ileana Roup, MD;  [facility];  Service: General;  Laterality: Bilateral;     Family History  Problem Relation Age of Onset   Breast cancer Mother 2   Hearing loss Mother    Heart disease Mother    High  Cholesterol Mother    Hypertension Mother    Cancer Father    Hearing loss Father    Stroke Father    Transient ischemic attack Father    Arthritis Maternal Grandmother    Cancer Maternal Grandmother    Early death Maternal Grandmother    Diabetes Maternal Grandfather    Early death Maternal Grandfather    Hearing loss Maternal Grandfather    Heart attack Maternal Grandfather    Heart disease Maternal Grandfather    Arthritis Paternal Grandmother    Early death Paternal Grandmother    Hearing loss Paternal Grandmother    Hyperlipidemia Paternal Grandmother    Early death Paternal Grandfather    Kidney disease Paternal 80    Cancer Sister    Hyperlipidemia Sister    Hypertension Sister    Birth defects Brother    Cancer Brother    Hyperlipidemia Brother      Social History   Socioeconomic History   Marital status: Widowed    Spouse name: Not on file   Number of children: Not on file   Years of education: Not on file   Highest education level: Not on file  Occupational History   Occupation: retired  Tobacco Use   Smoking status: Never   Smokeless tobacco: Never  Vaping Use   Vaping Use: Never used  Substance and Sexual Activity   Alcohol use: Yes    Alcohol/week: 8.0 standard drinks of alcohol    Types: 8 Glasses of wine per week    Comment: 3 times daily   Drug use: Never   Sexual activity: Not Currently  Other Topics Concern   Not on file  Social History Narrative   Not on file   Social Determinants of Health   Financial Resource Strain: Low Risk  (09/01/2021)   Overall Financial Resource Strain (CARDIA)    Difficulty of Paying Living Expenses: Not hard at all  Food Insecurity: No Food Insecurity (09/14/2022)   Hunger Vital Sign    Worried About Running Out of Food in the Last Year: Never true    Ran Out of Food in the Last Year: Never true  Transportation Needs: No Transportation Needs (09/14/2022)   PRAPARE - Radiographer, therapeutic (Medical): No    Lack of Transportation (Non-Medical): No  Physical Activity: Inactive (09/14/2022)   Exercise Vital Sign    Days of Exercise per Week: 0 days    Minutes of Exercise per Session: 0 min  Stress: No Stress Concern Present (09/14/2022)   [place]    Feeling of Stress : Not at all  Social Connections: Moderately Isolated (09/14/2022)  Social Licensed conveyancer [NHANES]    Frequency of Communication with Friends and Family: More than three times a week    Frequency of Social Gatherings with Friends and Family: More than three times a week    Attends Religious Services: More than 4 times per year    Active Member of [employer] or Organizations: No    Attends Archivist Meetings: Never    Marital Status: Widowed  Intimate Partner Violence: Not At Risk (09/14/2022)   Humiliation, Afraid, Rape, and Kick questionnaire    Fear of Current or Ex-Partner: No    Emotionally Abused: No    Physically Abused: No    Sexually Abused: No     BP (!) 142/62   Pulse (!) 55   Ht 5' 2.5" (1.588 m)   Wt 146 lb 12.8 oz (66.6 kg)   SpO2 98%   BMI 26.42 kg/m   Physical Exam:  Well appearing NAD HEENT: Unremarkable Neck:  No JVD, no thyromegally Lymphatics:  No adenopathy Back:  No CVA tenderness Lungs:  Clear with no wheezes HEART:  Regular rate rhythm, no murmurs, no rubs, no clicks Abd:  soft, positive bowel sounds, no organomegally, no rebound, no guarding Ext:  2 plus pulses, no edema, no cyanosis, no clubbing Skin:  No rashes no nodules Neuro:  CN II through XII intact, motor grossly intact  EKG - nsr  Assess/Plan: Atrial flutter - she has not had any more after ablation. Atrial fib - I discussed the treatment options with the patient and she will restart her blood thinner and undergo watchful waiting.  Carleene Overlie Umeka Wrench,MD

## 2022-11-21 DIAGNOSIS — R051 Acute cough: Secondary | ICD-10-CM | POA: Diagnosis not present

## 2022-11-21 DIAGNOSIS — H6692 Otitis media, unspecified, left ear: Secondary | ICD-10-CM | POA: Diagnosis not present

## 2022-11-21 DIAGNOSIS — H1089 Other conjunctivitis: Secondary | ICD-10-CM | POA: Diagnosis not present

## 2022-11-23 ENCOUNTER — Encounter (HOSPITAL_COMMUNITY): Payer: Self-pay | Admitting: *Deleted

## 2022-12-25 ENCOUNTER — Ambulatory Visit (INDEPENDENT_AMBULATORY_CARE_PROVIDER_SITE_OTHER): Payer: Medicare Other | Admitting: Family Medicine

## 2022-12-25 ENCOUNTER — Encounter: Payer: Self-pay | Admitting: Family Medicine

## 2022-12-25 VITALS — BP 147/76 | HR 64 | Temp 97.7°F | Ht 62.5 in | Wt 152.2 lb

## 2022-12-25 DIAGNOSIS — E663 Overweight: Secondary | ICD-10-CM | POA: Diagnosis not present

## 2022-12-25 DIAGNOSIS — I483 Typical atrial flutter: Secondary | ICD-10-CM | POA: Diagnosis not present

## 2022-12-25 DIAGNOSIS — H9319 Tinnitus, unspecified ear: Secondary | ICD-10-CM

## 2022-12-25 DIAGNOSIS — I4891 Unspecified atrial fibrillation: Secondary | ICD-10-CM | POA: Diagnosis not present

## 2022-12-25 DIAGNOSIS — K915 Postcholecystectomy syndrome: Secondary | ICD-10-CM | POA: Diagnosis not present

## 2022-12-25 DIAGNOSIS — I10 Essential (primary) hypertension: Secondary | ICD-10-CM

## 2022-12-25 MED ORDER — METFORMIN HCL 500 MG PO TABS: 500.0000 mg | ORAL_TABLET | Freq: Every day | ORAL | 3 refills | Status: DC

## 2022-12-25 NOTE — Patient Instructions (Signed)
It was very nice to see you today!  We can try metformin.  This should help with your metabolism and weight loss.  Send me a message in a few weeks to let me know how this is working for you.  I will refer you to see ENT and also to see the gastroenterologist.  Please keep an eye on your blood pressure and let us know if it is persistently elevated at home.  Take care, Dr Jerline Pain  PLEASE NOTE:  If you had any lab tests, please let us know if you have not heard back within a few days. You may see your results on mychart before we have a chance to review them but we will give you a call once they are reviewed by [place].   If we ordered any referrals today, please let us know if you have not heard from their office within the next week.   If you had any urgent prescriptions sent in today, please check with the pharmacy within an hour of our visit to make sure the prescription was transmitted appropriately.   Please try these tips to maintain a healthy lifestyle:  Eat at least 3 REAL meals and 1-2 snacks per day.  Aim for no more than 5 hours between eating.  If you eat breakfast, please do so within one hour of getting up.   Each meal should contain half fruits/vegetables, one quarter protein, and one quarter carbs (no bigger than a computer mouse)  Cut down on sweet beverages. This includes juice, soda, and sweet tea.   Drink at least 1 glass of water with each meal and aim for at least 8 glasses per day  Exercise at least 150 minutes every week.

## 2022-12-25 NOTE — Assessment & Plan Note (Signed)
Still having ongoing issues with diarrhea even with cholestyramine.  Will place referral to GI.

## 2022-12-25 NOTE — Progress Notes (Signed)
Denise Payne is a 77 y.o. female who presents today for an office visit.  Assessment/Plan:  New/Acute Problems: Eustachian tube dysfunction No red flags.  Will place referral to ENT.  The ENT she had seen previously has retired.  Was previously recommended to get tympanostomy tubes however she deferred but she is not ready to have this done.  Chronic Problems Addressed Today: Overweight BMI 27 today with comorbidities.  She has not had much success with diet and exercise.  Insurance will not pay for GLP-1 agonist.  We did discuss referral to medical weight management however she declined.  We discussed medication options.  Will start metformin 500 mg daily.  We discussed potential side effects.  She can follow-up in a few weeks via MyChart.  Benign essential HTN Mildly elevated today though typically well-controlled on metoprolol tartrate 25 mg twice daily.  She sees cardiology for this as well.  She takes Cardizem 180 mg as needed for atrial fibrillation.  She will continue to monitor at home and let us know if persistently elevated.  Atrial fibrillation ([place]) She has had a few flares of atrial fibrillation over the last few months and is now on Eliquis.  She has been tolerating this well.  She takes diltiazem 180 mg if needed for elevated heart rate.  This typically causes her to convert back to normal sinus rhythm.  She will continue current regimen for now.  Defer further management to cardiology.  Tinnitus Has hearing aids in place.  Will refer to ENT for her eustachian tube dysfunction.  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  Will place referral to GI.     Subjective:  HPI:  See A/p for status of chronic conditions.  Patient is here today for follow-up.  We last saw her 6 months ago.  Since our last visit she saw her cardiologist and was restarted on eliquis due to atrial fibrillation. This has been intermittent in  nature. She has been taking diltiazem as needed during flareups which works well. No significant side effects. She notes that she will spontaneously convert back to sinus rhythm after her rate comes back to normal.   She also had an ear infection and pneumonia about a month ago. She was seen at urgent care and treated with tobramycin drops and doxycline. She does have a history of tinnitus which seems to have worsened recently. She has previously seen ENT in the past and has ongoing issues with eustachian tube dysfunction.   She has ongoing issues with diarrhea. She does have a history of diverticulitis resulting in hospitalization and sigmoidectomy.  Also has a history of pancreatic insufficiency and takes cholestyramine.  Symptoms have been waxing and waning but do seem to be worsening recently.  Her last colonoscopy was about 5 years ago in [place].  She is also having difficulty with weight loss.  This has been an ongoing issue.  Insurance will not pay for GLP-1 agonist.  She has been through nutrition counseling and weight management programs.        Objective:  Physical Exam: BP (!) 147/76   Pulse 64   Temp 97.7 F (36.5 C) (Temporal)   Ht 5' 2.5" (1.588 m)   Wt 152 lb 3.2 oz (69 kg)   SpO2 98%   BMI 27.39 kg/m   Gen: No acute distress, resting comfortably HEENT: Left TM retracted.  Right TM with effusion. CV: Regular rate and rhythm with no murmurs appreciated Pulm: Normal  work of breathing, clear to auscultation bilaterally with no crackles, wheezes, or rhonchi Neuro: Grossly normal, moves all extremities Psych: Normal affect and thought content      Denise Payne M. Jerline Pain, MD 12/25/2022 2:04 PM

## 2022-12-25 NOTE — Assessment & Plan Note (Signed)
BMI 27 today with comorbidities.  She has not had much success with diet and exercise.  Insurance will not pay for GLP-1 agonist.  We did discuss referral to medical weight management however she declined.  We discussed medication options.  Will start metformin 500 mg daily.  We discussed potential side effects.  She can follow-up in a few weeks via MyChart.

## 2022-12-25 NOTE — Assessment & Plan Note (Signed)
Mildly elevated today though typically well-controlled on metoprolol tartrate 25 mg twice daily.  She sees cardiology for this as well.  She takes Cardizem 180 mg as needed for atrial fibrillation.  She will continue to monitor at home and let us know if persistently elevated.

## 2022-12-25 NOTE — Assessment & Plan Note (Signed)
She has had a few flares of atrial fibrillation over the last few months and is now on Eliquis.  She has been tolerating this well.  She takes diltiazem 180 mg if needed for elevated heart rate.  This typically causes her to convert back to normal sinus rhythm.  She will continue current regimen for now.  Defer further management to cardiology.

## 2022-12-25 NOTE — Assessment & Plan Note (Signed)
Has hearing aids in place.  Will refer to ENT for her eustachian tube dysfunction.

## 2022-12-26 ENCOUNTER — Encounter: Payer: Self-pay | Admitting: Physician Assistant

## 2023-01-30 ENCOUNTER — Other Ambulatory Visit: Payer: Self-pay

## 2023-01-30 ENCOUNTER — Encounter: Payer: Self-pay | Admitting: Family Medicine

## 2023-01-30 DIAGNOSIS — H9319 Tinnitus, unspecified ear: Secondary | ICD-10-CM

## 2023-02-05 ENCOUNTER — Ambulatory Visit (INDEPENDENT_AMBULATORY_CARE_PROVIDER_SITE_OTHER): Payer: Medicare Other | Admitting: Physician Assistant

## 2023-02-05 ENCOUNTER — Other Ambulatory Visit (INDEPENDENT_AMBULATORY_CARE_PROVIDER_SITE_OTHER): Payer: Medicare Other

## 2023-02-05 ENCOUNTER — Encounter: Payer: Self-pay | Admitting: Physician Assistant

## 2023-02-05 DIAGNOSIS — K625 Hemorrhage of anus and rectum: Secondary | ICD-10-CM | POA: Diagnosis not present

## 2023-02-05 DIAGNOSIS — R197 Diarrhea, unspecified: Secondary | ICD-10-CM

## 2023-02-05 DIAGNOSIS — R140 Abdominal distension (gaseous): Secondary | ICD-10-CM

## 2023-02-05 DIAGNOSIS — R143 Flatulence: Secondary | ICD-10-CM | POA: Diagnosis not present

## 2023-02-05 DIAGNOSIS — R1030 Lower abdominal pain, unspecified: Secondary | ICD-10-CM

## 2023-02-05 DIAGNOSIS — Z8719 Personal history of other diseases of the digestive system: Secondary | ICD-10-CM

## 2023-02-05 LAB — COMPREHENSIVE METABOLIC PANEL
ALT: 13 U/L (ref 0–35)
AST: 18 U/L (ref 0–37)
Albumin: 4 g/dL (ref 3.5–5.2)
Alkaline Phosphatase: 66 U/L (ref 39–117)
BUN: 15 mg/dL (ref 6–23)
CO2: 28 mEq/L (ref 19–32)
Calcium: 9.1 mg/dL (ref 8.4–10.5)
Chloride: 106 mEq/L (ref 96–112)
Creatinine, Ser: 0.77 mg/dL (ref 0.40–1.20)
GFR: 74.27 mL/min (ref >60.00)
Glucose, Bld: 90 mg/dL (ref 70–99)
Potassium: 4.5 mEq/L (ref 3.5–5.1)
Sodium: 140 mEq/L (ref 135–145)
Total Bilirubin: 0.4 mg/dL (ref 0.2–1.2)
Total Protein: 6.3 g/dL (ref 6.0–8.3)

## 2023-02-05 LAB — CBC WITH DIFFERENTIAL/PLATELET
Basophils Absolute: 0 10*3/uL (ref 0.0–0.1)
Basophils Relative: 0.6 % (ref 0.0–3.0)
Eosinophils Absolute: 0.1 10*3/uL (ref 0.0–0.7)
Eosinophils Relative: 2.2 % (ref 0.0–5.0)
HCT: 35.2 % — ABNORMAL LOW (ref 36.0–46.0)
Hemoglobin: 12 g/dL (ref 12.0–15.0)
Lymphocytes Relative: 23.6 % (ref 12.0–46.0)
Lymphs Abs: 1.6 10*3/uL (ref 0.7–4.0)
MCHC: 34 g/dL (ref 30.0–36.0)
MCV: 87.3 fl (ref 78.0–100.0)
Monocytes Absolute: 0.5 10*3/uL (ref 0.1–1.0)
Monocytes Relative: 7.6 % (ref 3.0–12.0)
Neutro Abs: 4.4 10*3/uL (ref 1.4–7.7)
Neutrophils Relative %: 66 % (ref 43.0–77.0)
Platelets: 222 10*3/uL (ref 150.0–400.0)
RBC: 4.03 Mil/uL (ref 3.87–5.11)
RDW: 13.4 % (ref 11.5–15.5)
WBC: 6.7 10*3/uL (ref 4.0–10.5)

## 2023-02-05 LAB — SEDIMENTATION RATE: Sed Rate: 10 mm/hr (ref 0–30)

## 2023-02-05 LAB — C-REACTIVE PROTEIN: CRP: <1 mg/dL (ref 0.5–20.0)

## 2023-02-05 NOTE — Patient Instructions (Signed)
_______________________________________________________  If your blood pressure at your visit was 140/90 or greater, please contact your primary care physician to follow up on this. _______________________________________________________  If you are age 78 or older, your body mass index should be between 23-30. Your Body mass in15dex is 27.36 kg/m. If this is out of the aforementioned range listed, please consider follow up with your Primary Care Provider. ________________________________________________________  The [facility] providers would like to encourage you to use [facility] to communicate with providers for non-urgent requests or questions.  Due to long hold times on the telephone, sending your provider a message by [facility] may be a faster and more efficient way to get a response.  Please allow 48 business hours for a response.  Please remember that this is for non-urgent requests.  _______________________________________________________  Your provider has requested that you go to the basement level for lab work before leaving today. Press "B" on the elevator. The lab is located at the first door on the left as you exit the elevator.  You have been scheduled for a CT scan of the abdomen and pelvis at [facility], 1st floor Radiology. You are scheduled on 02-09-23 at 5pm. You should arrive 2 hours prior to your appointment time for registration and to drink the contrast.   Please follow the written instructions below on the day of your exam:   1) Do not eat anything after 11am (4 hours prior to your test)   You may take any medications as prescribed with a small amount of water, if necessary. If you take any of the following medications: METFORMIN, GLUCOPHAGE, GLUCOVANCE, AVANDAMET, RIOMET, FORTAMET, ACTOPLUS MET, JANUMET, GLUMETZA or METAGLIP, you MAY be asked to HOLD this medication 48 hours AFTER the exam.   The purpose of you drinking the oral contrast is to aid in the  visualization of your intestinal tract. The contrast solution may cause some diarrhea. Depending on your individual set of symptoms, you may also receive an intravenous injection of x-ray contrast/dye. Plan on being at [facility] for 45 minutes or longer, depending on the type of exam you are having performed.   If you have any questions regarding your exam or if you need to reschedule, you may call [facility] at [phone] between the hours of 8:00 am and 5:00 pm, Monday-Friday.   Due to recent changes in healthcare laws, you may see the results of your imaging and laboratory studies on MyChart before your provider has had a chance to review them.  We understand that in some cases there may be results that are confusing or concerning to you. Not all laboratory results come back in the same time frame and the provider may be waiting for multiple results in order to interpret others.  Please give [place] 48 hours in order for your provider to thoroughly review all the results before contacting the office for clarification of your results.   Thank you for entrusting me with your care and choosing [facility].  Amy Esterwood, PA-C

## 2023-02-05 NOTE — Progress Notes (Signed)
Subjective:    Patient ID: Denise Payne, female    DOB: 02-Nov-1945, 77 y.o.   MRN: [id number]  HPI Denise Payne is a pleasant 77 year old, new to GI today, referred by Dr. Jacquiline Doe for postcholecystectomy type syndrome symptoms, gassiness, intermittent diarrhea abdominal distention, and has also noted occasional bright red blood per rectum.  She relates that she last had colonoscopy while she was living in [place] in 2017 and was told that that was a negative exam and to follow-up in 10 years.  She also had EGD at that same time, was H. pylori positive and treated. In the spring 2020 she developed abdominal pain which was persistent, eventually required ER visit then hospitalization after which she was found to have complicated diverticulitis with micro perforation and evidence of colovesical fistula.  She was treated with IV Zosyn, then discharged home on Augmentin.  She required readmission on 5 1 with worsening of abdominal pain and cramping.  CT at that time showed worsening of diverticulitis and small developing abscess.  She was seen by surgery during those admissions, did not have GI involvement.  She was treated again with a course of IV antibiotics and then completed Cipro and Flagyl orally at home. She eventually returned for scheduled cystoscopy and ureteral stents, and robotic assisted low anterior sigmoid resection.  In 2022 she had admission for acute cholecystitis and underwent laparoscopic cholecystectomy.  She says after surgery she did develop postcholecystectomy syndrome symptoms with nausea indigestion and abdominal pain with any consumption of higher fat meals.  She generally was not having problems with any ongoing diarrhea.  She has prescription for Questran 4 g 3 times daily.  She has figured out that if she eats very low-fat she can control the symptoms and does not require the Questran on a regular basis unless she has eaten something that she should not. She  comes in today with complaints that over the past 2 to 3 months she has had increase in abdominal gassiness which has been very malodorous,.  She had been having fairly normal bowel movements but over the past couple of weeks had watery diarrhea which had been somewhat explosive, some abdominal distention, intermittent pain in the lower abdomen and has also noted occasional bright red blood on the tissue, not sure if she has seen blood in the stool.  Over the past few days diarrhea has resolved continues to have some lower abdominal discomfort, and gassiness. She had been on 2 courses of antibiotics over the past couple of months, initially for a sinus infection and then for pneumonia. No other new medications or alterations in dosages.  Other medical problems include hypertension, atrial fibrillation for which she is on Eliquis, asthma, GERD and Sjogren's disease.  She has been on Plaquenil for years, and is actually on a lower dose than she initially was prescribed.  Review of Systems Pertinent positive and negative review of systems were noted in the above HPI section.  All other review of systems was otherwise negative.   Outpatient Encounter Medications as of 02/05/2023  Medication Sig   acetaminophen (TYLENOL) 500 MG tablet Take 1,000 mg by mouth every 6 (six) hours as needed for mild pain.   Biotin 62952 MCG TABS Take 10,000 mcg by mouth daily.   Carboxymeth-Glyc-Polysorb PF (REFRESH OPTIVE MEGA-3) 0.5-1-0.5 % SOLN Place 1 drop into both eyes 4 (four) times daily as needed (dry eyes).   Cholecalciferol (VITAMIN D) 50 MCG (2000 UT) tablet Take 2,000 Units by  mouth daily.   cholestyramine (QUESTRAN) 4 g packet Take 1 packet (4 g total) by mouth 3 (three) times daily with meals. (Patient taking differently: Take 4 g by mouth 3 (three) times daily as needed (diarrhea).)   cyclobenzaprine (FLEXERIL) 10 MG tablet Take 1 tablet (10 mg total) by mouth 3 (three) times daily as needed for muscle spasms.    diclofenac sodium (VOLTAREN) 1 % GEL Apply 2 g topically 2 (two) times daily as needed (pain).    diltiazem (CARDIZEM CD) 180 MG 24 hr capsule Take 180 mg by mouth daily.   ELIQUIS 5 MG TABS tablet    esomeprazole (NEXIUM) 40 MG capsule Take 1 capsule (40 mg total) by mouth daily.   hydroxychloroquine (PLAQUENIL) 200 MG tablet Take 200 mg by mouth daily.   Melatonin 10 MG TABS Take 10 mg by mouth at bedtime as needed (sleep).    METAMUCIL FIBER PO Take 1 Scoop by mouth daily.   metFORMIN (GLUCOPHAGE) 500 MG tablet Take 1 tablet (500 mg total) by mouth daily with breakfast.   metoprolol tartrate (LOPRESSOR) 25 MG tablet Take 1 tablet (25 mg total) by mouth 2 (two) times daily.   montelukast (SINGULAIR) 10 MG tablet TAKE 1 TABLET AT BEDTIME   OVER THE COUNTER MEDICATION Place 1 Application into both eyes at bedtime. Optase Hylo night eye ointment   Probiotic Product (PROBIOTIC PO) Take 1 tablet by mouth daily.   rosuvastatin (CRESTOR) 10 MG tablet Take 1 tablet (10 mg total) by mouth daily.   vitamin B-12 (CYANOCOBALAMIN) 1000 MCG tablet Take 1,000 mcg by mouth daily.    No facility-administered encounter medications on file as of 02/05/2023.   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   Post-cholecystectomy syndrome 06/27/2021   Atherosclerosis of aorta 12/15/2020   Pancreatic cyst 12/15/2020   Benign essential HTN 04/12/2020   GERD (gastroesophageal reflux disease) 03/12/2020    Osteopenia 03/12/2020   Asthma 01/26/2019   Sjogren's syndrome 01/26/2019   Social History   Socioeconomic History   Marital status: Widowed    Spouse name: Not on file   Number of children: Not on file   Years of education: Not on file   Highest education level: Not on file  Occupational History   Occupation: retired  Tobacco Use   Smoking status: Never   Smokeless tobacco: Never  Vaping Use   Vaping Use: Never used  Substance and Sexual Activity   Alcohol use: Yes    Alcohol/week: 8.0 standard drinks of alcohol    Types: 8 Glasses of wine per week    Comment: 3 times daily   Drug use: Never   Sexual activity: Not Currently  Other Topics Concern   Not on file  Social History Narrative   Not on file   Social Determinants of Health   Financial Resource Strain: Low Risk  (09/01/2021)   Overall Financial Resource Strain (CARDIA)    Difficulty of Paying  Living Expenses: Not hard at all  Food Insecurity: No Food Insecurity (09/14/2022)   Hunger Vital Sign    Worried About Running Out of Food in the Last Year: Never true    Ran Out of Food in the Last Year: Never true  Transportation Needs: No Transportation Needs (09/14/2022)   PRAPARE - Administrator, Civil Service (Medical): No    Lack of Transportation (Non-Medical): No  Physical Activity: Inactive (09/14/2022)   Exercise Vital Sign    Days of Exercise per Week: 0 days    Minutes of Exercise per Session: 0 min  Stress: No Stress Concern Present (09/14/2022)   [employer] of Occupational Health - Occupational Stress Questionnaire    Feeling of Stress : Not at all  Social Connections: Moderately Isolated (09/14/2022)   Social Connection and Isolation Panel [NHANES]    Frequency of Communication with Friends and Family: More than three times a week    Frequency of Social Gatherings with Friends and Family: More than three times a week    Attends Religious Services: More than 4 times per year     Active Member of [employer] or Organizations: No    Attends Banker Meetings: Never    Marital Status: Widowed  Intimate Partner Violence: Not At Risk (09/14/2022)   Humiliation, Afraid, Rape, and Kick questionnaire    Fear of Current or Ex-Partner: No    Emotionally Abused: No    Physically Abused: No    Sexually Abused: No    Denise Payne's family history includes Arthritis in her maternal grandmother and paternal grandmother; Birth defects in her brother; Breast cancer (age of onset: 13) in her mother; Cancer in her brother, father, maternal grandmother, and sister; Diabetes in her maternal grandfather; Early death in her maternal grandfather, maternal grandmother, paternal grandfather, and paternal grandmother; Hearing loss in her father, maternal grandfather, mother, and paternal grandmother; Heart attack in her maternal grandfather; Heart disease in her maternal grandfather and mother; High Cholesterol in her mother; Hyperlipidemia in her brother, paternal grandmother, and sister; Hypertension in her mother and sister; Kidney disease in her paternal grandfather; Stroke in her father; Transient ischemic attack in her father.      Objective:    Vitals:   02/05/23 1030  BP: 132/72  Pulse: 63    Physical Exam Well-developed well-nourished eld WF  in no acute distress.  Height, Weight,152 BMI 27.36  HEENT; nontraumatic normocephalic, EOMI, PE R LA, sclera anicteric. Oropharynx;not examined today  Neck; supple, no JVD Cardiovascular; regular rate and rhythm with S1-S2, no murmur rub or gallop Pulmonary; Clear bilaterally Abdomen; soft, nontender, nondistended, no palpable mass or hepatosplenomegaly, bowel sounds are active,midline incisional scar Rectal;not done today  Skin; benign exam, no jaundice rash or appreciable lesions Extremities; no clubbing cyanosis or edema skin warm and dry Neuro/Psych; alert and oriented x4, grossly nonfocal mood and affect appropriate         Assessment & Plan:   #39  77 year old female, status post sigmoid resection 2020 for complicated diverticulitis, then laparoscopic cholecystectomy 2022 for acute cholecystitis with postcholecystectomy symptoms of indigestion, abdominal discomfort nausea cramping and occasional diarrhea which she has managed nicely with very low-fat diet and as needed use of Questran.  Now with 2 to 92-month history of severe malodorous gas, intermittent diarrhea with watery diarrhea last week now resolved, lower abdominal pain/discomfort, intermittent abdominal distention and occasional small-volume hematochezia primarily noticed on the tissue.  Etiology of current symptoms is not entirely  clear, she did take 2 courses of antibiotics in February for sinusitis then pneumonia.  Symptoms or not consistent with that usually seen with C. difficile but needs to be considered, rule out dysbiosis, SIBO. On exam she is tender in the left mid left lower and suprapubic area raising concern for other intra-abdominal inflammatory process, recurrent diverticulitis, question segmental colitis  #2 colon cancer surveillance-last colonoscopy 2017 in [facility] negative and told to have 10-year interval follow-up #3.  Prior history of H. pylori gastropathy 2017 treated #4 Sjogren's syndrome #5.  History of GERD-on Nexium 40 mg. #6.  Asthma #7.  Hypertension #8.  Atrial fibrillation-on Eliquis  Plan; CBC with differential, c-Met, sed rate, CRP Patient will pick up specimen containers for GI path panel stool for lactoferrin, fecal elastase and C. difficile if she has recurrence of diarrhea this week will ask her to submit the specimens. Scheduled for CT of the abdomen and pelvis with contrast. Further recommendations pending results of above.  Patient will be established with Dr. Tomasa Rand.   Kendallyn Lippold Oswald Hillock PA-C 02/05/2023   Cc: Ardith Dark, MD

## 2023-02-07 NOTE — Progress Notes (Signed)
Agree with the assessment and plan as outlined by Amy Esterwood, PA-C.  Laynee Lockamy E. Jawaun Celmer, MD  

## 2023-02-08 ENCOUNTER — Ambulatory Visit (HOSPITAL_BASED_OUTPATIENT_CLINIC_OR_DEPARTMENT_OTHER)
Admission: RE | Admit: 2023-02-08 | Discharge: 2023-02-08 | Disposition: A | Discharge status: Home / Self Care | Payer: Medicare Other | Source: Ambulatory Visit | Attending: Physician Assistant | Admitting: Physician Assistant

## 2023-02-08 DIAGNOSIS — R1030 Lower abdominal pain, unspecified: Secondary | ICD-10-CM | POA: Insufficient documentation

## 2023-02-08 DIAGNOSIS — K625 Hemorrhage of anus and rectum: Secondary | ICD-10-CM | POA: Diagnosis not present

## 2023-02-08 DIAGNOSIS — R197 Diarrhea, unspecified: Secondary | ICD-10-CM | POA: Insufficient documentation

## 2023-02-08 DIAGNOSIS — Z8719 Personal history of other diseases of the digestive system: Secondary | ICD-10-CM | POA: Insufficient documentation

## 2023-02-08 DIAGNOSIS — K3189 Other diseases of stomach and duodenum: Secondary | ICD-10-CM | POA: Diagnosis not present

## 2023-02-08 DIAGNOSIS — K5730 Diverticulosis of large intestine without perforation or abscess without bleeding: Secondary | ICD-10-CM | POA: Diagnosis not present

## 2023-02-08 DIAGNOSIS — R143 Flatulence: Secondary | ICD-10-CM | POA: Insufficient documentation

## 2023-02-08 DIAGNOSIS — R140 Abdominal distension (gaseous): Secondary | ICD-10-CM | POA: Insufficient documentation

## 2023-02-08 MED ORDER — IOHEXOL 300 MG/ML  SOLN
100.0000 mL | Freq: Once | INTRAMUSCULAR | Status: AC | PRN
  Administered 2023-02-08: 75 mL via INTRAVENOUS

## 2023-02-09 ENCOUNTER — Other Ambulatory Visit (HOSPITAL_COMMUNITY): Payer: Medicare Other

## 2023-02-14 ENCOUNTER — Other Ambulatory Visit: Payer: Self-pay | Admitting: Internal Medicine

## 2023-02-14 DIAGNOSIS — I4891 Unspecified atrial fibrillation: Secondary | ICD-10-CM

## 2023-02-14 NOTE — Telephone Encounter (Signed)
Eliquis 5mg  refill request received. Patient is 77 years old, weight-68.9kg, Crea-0.77 on 02/05/23, Diagnosis-Aflutter, and last seen by Dr. Ladona Ridgel on 09/29/22. Dose is appropriate based on dosing criteria. Will send in refill to requested pharmacy.

## 2023-03-13 DIAGNOSIS — H6692 Otitis media, unspecified, left ear: Secondary | ICD-10-CM | POA: Diagnosis not present

## 2023-03-13 DIAGNOSIS — H9319 Tinnitus, unspecified ear: Secondary | ICD-10-CM | POA: Diagnosis not present

## 2023-03-13 DIAGNOSIS — H6122 Impacted cerumen, left ear: Secondary | ICD-10-CM | POA: Diagnosis not present

## 2023-03-13 DIAGNOSIS — H905 Unspecified sensorineural hearing loss: Secondary | ICD-10-CM | POA: Diagnosis not present

## 2023-03-14 ENCOUNTER — Encounter: Payer: Self-pay | Admitting: Family Medicine

## 2023-03-14 DIAGNOSIS — M3501 Sicca syndrome with keratoconjunctivitis: Secondary | ICD-10-CM | POA: Diagnosis not present

## 2023-03-14 DIAGNOSIS — M25561 Pain in right knee: Secondary | ICD-10-CM | POA: Diagnosis not present

## 2023-03-14 DIAGNOSIS — R600 Localized edema: Secondary | ICD-10-CM | POA: Diagnosis not present

## 2023-03-14 DIAGNOSIS — M1991 Primary osteoarthritis, unspecified site: Secondary | ICD-10-CM | POA: Diagnosis not present

## 2023-03-14 DIAGNOSIS — E663 Overweight: Secondary | ICD-10-CM | POA: Diagnosis not present

## 2023-03-14 DIAGNOSIS — M5136 Other intervertebral disc degeneration, lumbar region: Secondary | ICD-10-CM | POA: Diagnosis not present

## 2023-03-14 DIAGNOSIS — R5383 Other fatigue: Secondary | ICD-10-CM | POA: Diagnosis not present

## 2023-03-14 DIAGNOSIS — Z6827 Body mass index (BMI) 27.0-27.9, adult: Secondary | ICD-10-CM | POA: Diagnosis not present

## 2023-03-15 ENCOUNTER — Other Ambulatory Visit: Payer: Self-pay

## 2023-03-15 ENCOUNTER — Ambulatory Visit (INDEPENDENT_AMBULATORY_CARE_PROVIDER_SITE_OTHER): Payer: Medicare Other | Admitting: Family Medicine

## 2023-03-15 ENCOUNTER — Encounter: Payer: Self-pay | Admitting: Family Medicine

## 2023-03-15 VITALS — BP 162/76 | HR 57 | Temp 97.5°F | Ht 62.5 in | Wt 154.2 lb

## 2023-03-15 DIAGNOSIS — E663 Overweight: Secondary | ICD-10-CM | POA: Diagnosis not present

## 2023-03-15 DIAGNOSIS — I10 Essential (primary) hypertension: Secondary | ICD-10-CM | POA: Diagnosis not present

## 2023-03-15 DIAGNOSIS — Z6827 Body mass index (BMI) 27.0-27.9, adult: Secondary | ICD-10-CM

## 2023-03-15 MED ORDER — CYCLOBENZAPRINE HCL 10 MG PO TABS: 10.0000 mg | ORAL_TABLET | Freq: Three times a day (TID) | ORAL | 0 refills | Status: DC | PRN

## 2023-03-15 MED ORDER — WEGOVY 0.25 MG/0.5ML ~~LOC~~ SOAJ: 0.2500 mg | SUBCUTANEOUS | 0 refills | Status: DC

## 2023-03-15 NOTE — Assessment & Plan Note (Signed)
Elevated today.  She has been elevated and her doctors visits earlier this week as well.  She is currently on metoprolol tartrate 25 mg twice daily and Cardizem 180 mg daily as needed for atrial fibrillation.  Her blood pressures previously been well-controlled.  She will continue to monitor at home and let us know if elevated over the next week or so.  Would consider increasing dose of metoprolol versus addition of additional antihypertensive such as amlodipine.

## 2023-03-15 NOTE — Progress Notes (Signed)
   Denise Payne is a 77 y.o. female who presents today for an office visit.  Assessment/Plan:  Chronic Problems Addressed Today: Overweight BMI 27.75 today with comorbidities including cardiovascular disease.  Insurance has now stated that they would pay for [facility] with prior authorization.  We will send in prescription for this today.  Start 0.25 mg weekly for 4 weeks and then increase to 0.5 mg weekly.  We discussed potential side effects.  She will follow-up with me in a few weeks via MyChart and we can titrate the dose as tolerated.  Benign essential HTN Elevated today.  She has been elevated and her doctors visits earlier this week as well.  She is currently on metoprolol tartrate 25 mg twice daily and Cardizem 180 mg daily as needed for atrial fibrillation.  Her blood pressures previously been well-controlled.  She will continue to monitor at home and let us know if elevated over the next week or so.  Would consider increasing dose of metoprolol versus addition of additional antihypertensive such as amlodipine.     Subjective:  HPI:  See A/P for status of chronic conditions.  Patient is here today to discuss weight loss medication options.  We saw her a few months ago for this.  Insurance at that time would not pay for GLP-1 agonist.  Was started on metformin.  This has not been effective.  She recently found out that insurance would pay for GLP-1 agonist.  She is interested in starting [place].       Objective:  Physical Exam: BP (!) 163/76   Pulse (!) 57   Temp (!) 97.5 F (36.4 C) (Temporal)   Ht 5' 2.5" (1.588 m)   Wt 154 lb 3.2 oz (69.9 kg)   SpO2 97%   BMI 27.75 kg/m   Wt Readings from Last 3 Encounters:  03/15/23 154 lb 3.2 oz (69.9 kg)  02/05/23 152 lb (68.9 kg)  12/25/22 152 lb 3.2 oz (69 kg)  Gen: No acute distress, resting comfortably Neuro: Grossly normal, moves all extremities Psych: Normal affect and thought content      Anacaren Kohan M. Jimmey Ralph, MD 03/15/2023  2:43 PM

## 2023-03-15 NOTE — Assessment & Plan Note (Signed)
BMI 27.75 today with comorbidities including cardiovascular disease.  Insurance has now stated that they would pay for [facility] with prior authorization.  We will send in prescription for this today.  Start 0.25 mg weekly for 4 weeks and then increase to 0.5 mg weekly.  We discussed potential side effects.  She will follow-up with me in a few weeks via MyChart and we can titrate the dose as tolerated.

## 2023-03-15 NOTE — Patient Instructions (Signed)
It was very nice to see you today!  I will send in a prescription for [facility].  Sending message in a few weeks labs doing.  Keep an eye on your pressure and let us know if it is persistently elevated  Return in about 4 weeks (around 04/12/2023), or if symptoms worsen or fail to improve.   Take care, Dr Jimmey Ralph  PLEASE NOTE:  If you had any lab tests, please let us know if you have not heard back within a few days. You may see your results on mychart before we have a chance to review them but we will give you a call once they are reviewed by [place].   If we ordered any referrals today, please let us know if you have not heard from their office within the next week.   If you had any urgent prescriptions sent in today, please check with the pharmacy within an hour of our visit to make sure the prescription was transmitted appropriately.   Please try these tips to maintain a healthy lifestyle:  Eat at least 3 REAL meals and 1-2 snacks per day.  Aim for no more than 5 hours between eating.  If you eat breakfast, please do so within one hour of getting up.   Each meal should contain half fruits/vegetables, one quarter protein, and one quarter carbs (no bigger than a computer mouse)  Cut down on sweet beverages. This includes juice, soda, and sweet tea.   Drink at least 1 glass of water with each meal and aim for at least 8 glasses per day  Exercise at least 150 minutes every week.

## 2023-03-21 ENCOUNTER — Telehealth: Payer: Self-pay | Admitting: *Deleted

## 2023-03-21 ENCOUNTER — Encounter: Payer: Self-pay | Admitting: Family Medicine

## 2023-03-21 NOTE — Telephone Encounter (Signed)
PA was denied  Patient will like to do an appeal Will bring paperwork tomorrow

## 2023-03-21 NOTE — Telephone Encounter (Signed)
(  Key: [id number]) - [id number] Wegovy 0.25MG /0.5ML auto-injectors Sent: June 5th, 2024 Waiting for determination

## 2023-04-06 NOTE — Telephone Encounter (Signed)
Caller called in regards to appeal for wegovy. States they have some more clinical questions in order to put the appeal through. States this is due by 6/28 and they can be reached at [phone].

## 2023-04-10 NOTE — Progress Notes (Signed)
Cardiology Office Note   Date:  04/11/2023   ID:  Denise Payne, Goodell January 23, 1946, MRN [id number]  PCP:  Ardith Dark, MD  Cardiologist:   Dietrich Pates, MD  Pt presents for follow up of atrial fibrillation       History of Present Illness: Denise Payne is a 77 y.o. female with a history of atrial flutter  (s/p ablation) and PAF  She has been seen by Rosette Reveal in clinic, last in Dec 2023  The pt says she has had some spells of atrial fibrillation   She also had a spell that woke her up from sleep one night at 3 am    Fluttering lasted until 6 PM that evening    Felt very SOB     When not in afib she feels OK   No CP Breathing is better      Current Meds  Medication Sig   acetaminophen (TYLENOL) 500 MG tablet Take 1,000 mg by mouth every 6 (six) hours as needed for mild pain.   apixaban (ELIQUIS) 5 MG TABS tablet TAKE 1 TABLET TWICE A DAY   Biotin 19147 MCG TABS Take 10,000 mcg by mouth daily.   Carboxymeth-Glyc-Polysorb PF (REFRESH OPTIVE MEGA-3) 0.5-1-0.5 % SOLN Place 1 drop into both eyes 4 (four) times daily as needed (dry eyes).   Cholecalciferol (VITAMIN D) 50 MCG (2000 UT) tablet Take 2,000 Units by mouth daily.   cholestyramine (QUESTRAN) 4 g packet Take 1 packet (4 g total) by mouth 3 (three) times daily with meals. (Patient taking differently: Take 4 g by mouth 3 (three) times daily as needed (diarrhea).)   cyclobenzaprine (FLEXERIL) 10 MG tablet Take 1 tablet (10 mg total) by mouth 3 (three) times daily as needed for muscle spasms.   diclofenac sodium (VOLTAREN) 1 % GEL Apply 2 g topically 2 (two) times daily as needed (pain).    esomeprazole (NEXIUM) 40 MG capsule Take 1 capsule (40 mg total) by mouth daily.   hydroxychloroquine (PLAQUENIL) 200 MG tablet Take 200 mg by mouth daily.   Melatonin 10 MG TABS Take 10 mg by mouth at bedtime as needed (sleep).    METAMUCIL FIBER PO Take 1 Scoop by mouth daily.   metoprolol tartrate (LOPRESSOR) 50 MG tablet Take 1 tablet  (50 mg total) by mouth 2 (two) times daily.   montelukast (SINGULAIR) 10 MG tablet TAKE 1 TABLET AT BEDTIME   OVER THE COUNTER MEDICATION Place 1 Application into both eyes at bedtime. Optase Hylo night eye ointment   Probiotic Product (PROBIOTIC PO) Take 1 tablet by mouth daily.   rosuvastatin (CRESTOR) 10 MG tablet Take 1 tablet (10 mg total) by mouth daily.   vitamin B-12 (CYANOCOBALAMIN) 1000 MCG tablet Take 1,000 mcg by mouth daily.    [DISCONTINUED] diltiazem (CARDIZEM CD) 180 MG 24 hr capsule Take 180 mg by mouth as needed.   [DISCONTINUED] metoprolol tartrate (LOPRESSOR) 25 MG tablet Take 1 tablet (25 mg total) by mouth 2 (two) times daily.     Allergies:   Latex, Meperidine hcl, Morphine and codeine, and Onion   Past Medical History:  Diagnosis Date   Anemia    Asthma    Atrial fibrillation (HCC)    Cataract    Gallstones    GERD (gastroesophageal reflux disease)    High blood pressure    Osteoporosis    Sjogren's syndrome (HCC) 01/26/2019    Past Surgical History:  Procedure Laterality Date   A-FLUTTER ABLATION N/A 05/17/2022  Procedure: A-FLUTTER ABLATION;  Surgeon: Marinus Maw, MD;  Location: [facility] INVASIVE CV LAB;  Service: Cardiovascular;  Laterality: N/A;   ABDOMINAL HYSTERECTOMY  1996   ANKLE FRACTURE SURGERY  2005   AUGMENTATION MAMMAPLASTY Bilateral 10/16/1976   CHOLECYSTECTOMY N/A 12/07/2020   Procedure: LAPAROSCOPIC CHOLECYSTECTOMY WITH INTRAOPERATIVE CHOLANGIOGRAM;  Surgeon: Sheliah Hatch De Blanch, MD;  Location: [facility] OR;  Service: General;  Laterality: N/A;   CYSTOSCOPY WITH STENT PLACEMENT N/A 05/22/2019   Procedure: CYSTOSCOPY WITH STENT PLACEMENT;  Surgeon: Bjorn Pippin, MD;  Location: [facility] ORS;  Service: Urology;  Laterality: N/A;   FLEXIBLE SIGMOIDOSCOPY N/A 05/22/2019   Procedure: FLEXIBLE SIGMOIDOSCOPY;  Surgeon: Andria Meuse, MD;  Location: [facility] ORS;  Service: General;  Laterality: N/A;   XI ROBOTIC ASSISTED LOWER ANTERIOR RESECTION Bilateral 05/22/2019    Procedure: XI ROBOTIC ASSISTED SIGMOIDECTOMY;  Surgeon: Andria Meuse, MD;  Location: [facility] ORS;  Service: General;  Laterality: Bilateral;     Social History:  The patient  reports that she has never smoked. She has never used smokeless tobacco. She reports current alcohol use of about 8.0 standard drinks of alcohol per week. She reports that she does not use drugs.   Family History:  The patient's family history includes Arthritis in her maternal grandmother and paternal grandmother; Birth defects in her brother; Breast cancer (age of onset: 53) in her mother; Cancer in her brother, father, maternal grandmother, and sister; Diabetes in her maternal grandfather; Early death in her maternal grandfather, maternal grandmother, paternal grandfather, and paternal grandmother; Hearing loss in her father, maternal grandfather, mother, and paternal grandmother; Heart attack in her maternal grandfather; Heart disease in her maternal grandfather and mother; High Cholesterol in her mother; Hyperlipidemia in her brother, paternal grandmother, and sister; Hypertension in her mother and sister; Kidney disease in her paternal grandfather; Stroke in her father; Transient ischemic attack in her father.    ROS:  Please see the history of present illness. All other systems are reviewed and  Negative to the above problem except as noted.    PHYSICAL EXAM: VS:  BP (!) 144/62   Pulse 67   Ht 5' 0.25" (1.53 m)   Wt 150 lb (68 kg)   SpO2 97%   BMI 29.05 kg/m   GEN: Well nourished, well developed, in no acute distress  HEENT: normal  Neck: no JVD, carotid bruits Cardiac: RRR; no murmurs  No LE edema  Respiratory:  clear to auscultation bilaterally,   EKG:  EKG is not ordered today.   Lipid Panel    Component Value Date/Time   CHOL 150 12/27/2021 1212   TRIG 113.0 12/27/2021 1212   HDL 72.90 12/27/2021 1212   CHOLHDL 2 12/27/2021 1212   VLDL 22.6 12/27/2021 1212   LDLCALC 54 12/27/2021 1212       Wt Readings from Last 3 Encounters:  04/11/23 150 lb (68 kg)  03/15/23 154 lb 3.2 oz (69.9 kg)  02/05/23 152 lb (68.9 kg)      ASSESSMENT AND PLAN:  1  Rhythm   Pt with intermitt episodes of afib   Very symptomatic   I would recomm increasing metoprolol to 50 bid    Will review with EP  2   Atrial flutter s/p ablation    2  Dyspnea.   Pt with severe dyspnea with afib    ? If more than just rhythm   Will set up for cardiac CTA    3  HTN  BP is a little  high   Can be higher at home   INcrease metoprolol to 50 bid   Follow    Current medicines are reviewed at length with the patient today.  The patient does not have concerns regarding medicines.  Signed, Dietrich Pates, MD  04/11/2023 11:08 PM    [facility] [address] [place], [place], [place]  [address] Phone: [phone]; Fax: [phone]

## 2023-04-11 ENCOUNTER — Encounter: Payer: Self-pay | Admitting: Internal Medicine

## 2023-04-11 ENCOUNTER — Ambulatory Visit: Payer: Medicare Other | Attending: Internal Medicine | Admitting: Internal Medicine

## 2023-04-11 VITALS — BP 144/62 | HR 67 | Ht 60.25 in | Wt 150.0 lb

## 2023-04-11 DIAGNOSIS — R0602 Shortness of breath: Secondary | ICD-10-CM | POA: Diagnosis not present

## 2023-04-11 DIAGNOSIS — Z01812 Encounter for preprocedural laboratory examination: Secondary | ICD-10-CM | POA: Diagnosis not present

## 2023-04-11 DIAGNOSIS — I700 Atherosclerosis of aorta: Secondary | ICD-10-CM

## 2023-04-11 DIAGNOSIS — R079 Chest pain, unspecified: Secondary | ICD-10-CM | POA: Diagnosis not present

## 2023-04-11 MED ORDER — METOPROLOL TARTRATE 50 MG PO TABS: 50.0000 mg | ORAL_TABLET | Freq: Two times a day (BID) | ORAL | 3 refills | Status: DC

## 2023-04-11 MED ORDER — DILTIAZEM HCL ER COATED BEADS 180 MG PO CP24: 180.0000 mg | ORAL_CAPSULE | ORAL | 3 refills | Status: DC | PRN

## 2023-04-11 NOTE — Patient Instructions (Addendum)
Medication Instructions:  Your physician has recommended you make the following change in your medication:   1) INCREASE metoprolol tartrate (Lopressor) to 50mg  twice daily  *If you need a refill on your cardiac medications before your next appointment, please call your pharmacy*  Lab Work: In 2-3 weeks: BMET If you have labs (blood work) drawn today and your tests are completely normal, you will receive your results only by: MyChart Message (if you have MyChart) OR A paper copy in the mail If you have any lab test that is abnormal or we need to change your treatment, we will call you to review the results.  Testing/Procedures: Your physician has requested that you have cardiac CT. Cardiac computed tomography (CT) is a painless test that uses an x-ray machine to take clear, detailed pictures of your heart. For further information please visit [email or web address] Please follow instruction sheet as given.   Follow-Up: At [facility], you and your health needs are our priority.  As part of our continuing mission to provide you with exceptional heart care, we have created designated Provider Care Teams.  These Care Teams include your primary Cardiologist (physician) and Advanced Practice Providers (APPs -  Physician Assistants and Nurse Practitioners) who all work together to provide you with the care you need, when you need it.  Your next appointment:   6 month(s)  The format for your next appointment:   In Person  Provider:   Dietrich Pates, MD  Other Instructions   Your cardiac CT will be scheduled at:   [facility] [address] [place], [place] [address] [phone]  Please arrive at the [facility] and Children's Entrance (Entrance C2) of [facility] 30 minutes prior to test start time. You can use the FREE valet parking offered at entrance C (encouraged to control the heart rate for the test)  Proceed to the [facility] Radiology Department (first floor)  to check-in and test prep.  All radiology patients and guests should use entrance C2 at [facility], accessed from [facility], even though the hospital's physical address listed is [address]    Please follow these instructions carefully (unless otherwise directed):  An IV will be required for this test and Nitroglycerin will be given.  Hold all erectile dysfunction medications at least 3 days (72 hrs) prior to test. (Ie viagra, cialis, sildenafil, tadalafil, etc)   On the Night Before the Test: Be sure to Drink plenty of water. Do not consume any caffeinated/decaffeinated beverages or chocolate 12 hours prior to your test. Do not take any antihistamines 12 hours prior to your test.  On the Day of the Test: Drink plenty of water until 1 hour prior to the test. Do not eat any food 1 hour prior to test. You may take your regular medications prior to the test.  Take metoprolol (Lopressor) 50mg  two hours prior to test. FEMALES- please wear underwire-free bra if available, avoid dresses & tight clothing  After the Test: Drink plenty of water. After receiving IV contrast, you may experience a mild flushed feeling. This is normal. On occasion, you may experience a mild rash up to 24 hours after the test. This is not dangerous. If this occurs, you can take Benadryl 25 mg and increase your fluid intake. If you experience trouble breathing, this can be serious. If it is severe call 911 IMMEDIATELY. If it is mild, please call our office. If you take any of these medications: Glipizide/Metformin, Avandament,  Glucavance, please do not take 48 hours after completing test unless otherwise instructed.  We will call to schedule your test 2-4 weeks out understanding that some insurance companies will need an authorization prior to the service being performed.   For more information and frequently asked questions, please visit our website :  [email or web address]  For non-scheduling related questions, please contact the cardiac imaging nurse navigator should you have any questions/concerns: Rockwell Alexandria, Cardiac Imaging Nurse Navigator Larey Brick, Cardiac Imaging Nurse Navigator [facility] Direct Office Dial: [phone]   For scheduling needs, including cancellations and rescheduling, please call [place], [phone].

## 2023-04-12 ENCOUNTER — Other Ambulatory Visit: Payer: Self-pay

## 2023-04-12 ENCOUNTER — Ambulatory Visit (INDEPENDENT_AMBULATORY_CARE_PROVIDER_SITE_OTHER): Payer: Medicare Other | Admitting: Family Medicine

## 2023-04-12 ENCOUNTER — Encounter: Payer: Self-pay | Admitting: Family Medicine

## 2023-04-12 ENCOUNTER — Ambulatory Visit: Payer: Medicare Other | Attending: Internal Medicine

## 2023-04-12 VITALS — BP 130/56 | HR 57 | Temp 97.8°F | Ht 60.25 in | Wt 150.2 lb

## 2023-04-12 DIAGNOSIS — Z6829 Body mass index (BMI) 29.0-29.9, adult: Secondary | ICD-10-CM

## 2023-04-12 DIAGNOSIS — I700 Atherosclerosis of aorta: Secondary | ICD-10-CM

## 2023-04-12 DIAGNOSIS — Z01812 Encounter for preprocedural laboratory examination: Secondary | ICD-10-CM

## 2023-04-12 DIAGNOSIS — I10 Essential (primary) hypertension: Secondary | ICD-10-CM | POA: Diagnosis not present

## 2023-04-12 DIAGNOSIS — E663 Overweight: Secondary | ICD-10-CM | POA: Diagnosis not present

## 2023-04-12 NOTE — Telephone Encounter (Signed)
Received call from [place] with [employer] needed to answer more clinical questions for appeal for [facility]. Answered all questions to the best of my knowledge. Victorino Dike said they will review and send fax with decision.

## 2023-04-12 NOTE — Assessment & Plan Note (Signed)
Cardiology recently increase metoprolol to 50 mg twice daily.  She is also on diltiazem 180 mg daily.  Blood pressure is at goal today.

## 2023-04-12 NOTE — Progress Notes (Signed)
   Dealie Koelzer is a 77 y.o. female who presents today for an office visit.  Assessment/Plan:  Chronic Problems Addressed Today: Overweight BMI 29.1 with comorbidities including cardiovascular disease.  We tried to get her on GLP-1 agonist however insurance would not approve this.  They had discussed trial of alternative weight loss medications including Qsymia  or contravewhich I would recommend against at this point due to her cardiovascular issues and hypertension.  Hopefully insurance will pay for Wegovy especially due to new evidence showing that GLP-1 agonist have extra cardioprotective benefit regardless of diabetes status.  She will continue to work on diet and exercise.  If insurance does not approve Wegovy she will follow-up with [place] in a few weeks to let us know how she is doing.  Benign essential HTN Cardiology recently increase metoprolol to 50 mg twice daily.  She is also on diltiazem 180 mg daily.  Blood pressure is at goal today.    Subjective:  HPI:  See Assessment / plan for status of chronic conditions.    Patient was here a month ago.  At that time we discussed weight loss.  We tried to get her on Wegovy however insurance would not pay for this.  Prior authorization was denied.  She is currently going through appeals process.       Objective:  Physical Exam: BP (!) 130/56 (BP Location: Left Arm, Patient Position: Sitting, Cuff Size: Normal)   Pulse (!) 57   Temp 97.8 F (36.6 C) (Temporal)   Ht 5' 0.25" (1.53 m)   Wt 150 lb 4 oz (68.2 kg)   SpO2 99%   BMI 29.10 kg/m   Gen: No acute distress, resting comfortably CV: Regular rate and rhythm with no murmurs appreciated Pulm: Normal work of breathing, clear to auscultation bilaterally with no crackles, wheezes, or rhonchi Neuro: Grossly normal, moves all extremities Psych: Normal affect and thought content      Kimba Lottes M. Jimmey Ralph, MD 04/12/2023 1:31 PM

## 2023-04-12 NOTE — Assessment & Plan Note (Signed)
BMI 29.1 with comorbidities including cardiovascular disease.  We tried to get her on GLP-1 agonist however insurance would not approve this.  They had discussed trial of alternative weight loss medications including Qsymia  or contravewhich I would recommend against at this point due to her cardiovascular issues and hypertension.  Hopefully insurance will pay for Wegovy especially due to new evidence showing that GLP-1 agonist have extra cardioprotective benefit regardless of diabetes status.  She will continue to work on diet and exercise.  If insurance does not approve Wegovy she will follow-up with [place] in a few weeks to let us know how she is doing.

## 2023-04-13 ENCOUNTER — Other Ambulatory Visit: Payer: Self-pay

## 2023-04-13 LAB — BASIC METABOLIC PANEL
BUN/Creatinine Ratio: 25 (ref 12–28)
BUN: 17 mg/dL (ref 8–27)
CO2: 24 mmol/L (ref 20–29)
Calcium: 9.3 mg/dL (ref 8.7–10.3)
Chloride: 107 mmol/L — ABNORMAL HIGH (ref 96–106)
Creatinine, Ser: 0.69 mg/dL (ref 0.57–1.00)
Glucose: 98 mg/dL (ref 70–99)
Potassium: 4.3 mmol/L (ref 3.5–5.2)
Sodium: 143 mmol/L (ref 134–144)
eGFR: 89 mL/min/{1.73_m2} (ref >59)

## 2023-04-13 MED ORDER — DILTIAZEM HCL ER COATED BEADS 180 MG PO CP24: 180.0000 mg | ORAL_CAPSULE | Freq: Every day | ORAL | 3 refills | Status: DC | PRN

## 2023-04-16 ENCOUNTER — Encounter (HOSPITAL_COMMUNITY): Payer: Self-pay

## 2023-04-17 ENCOUNTER — Telehealth (HOSPITAL_COMMUNITY): Payer: Self-pay | Admitting: *Deleted

## 2023-04-17 ENCOUNTER — Other Ambulatory Visit (HOSPITAL_COMMUNITY): Payer: Self-pay

## 2023-04-17 ENCOUNTER — Telehealth: Payer: Self-pay

## 2023-04-17 NOTE — Telephone Encounter (Signed)
Reaching out to patient to offer assistance regarding upcoming cardiac imaging study; pt verbalizes understanding of appt date/time, parking situation and where to check in, pre-test NPO status and medications ordered, and verified current allergies; name and call back number provided for further questions should they arise  Larey Brick RN Navigator Cardiac Imaging [facility] [phone] office [phone] cell  Patient to take her daily medications and will arrive at 11:30 am.

## 2023-04-17 NOTE — Telephone Encounter (Signed)
Patient Advocate Encounter  Prior Authorization for [employer] 0.25mg /0.69ml has been approved with [employer].    Effective dates: through 10/12/23  Per [facility] test claim, copay for 28 days supply is $43

## 2023-04-18 ENCOUNTER — Ambulatory Visit (HOSPITAL_COMMUNITY)
Admission: RE | Admit: 2023-04-18 | Discharge: 2023-04-18 | Disposition: A | Discharge status: Home / Self Care | Payer: Medicare Other | Source: Ambulatory Visit | Attending: Internal Medicine | Admitting: Internal Medicine

## 2023-04-18 ENCOUNTER — Other Ambulatory Visit: Payer: Self-pay | Admitting: *Deleted

## 2023-04-18 ENCOUNTER — Encounter: Payer: Self-pay | Admitting: Family Medicine

## 2023-04-18 DIAGNOSIS — I700 Atherosclerosis of aorta: Secondary | ICD-10-CM | POA: Diagnosis not present

## 2023-04-18 DIAGNOSIS — R079 Chest pain, unspecified: Secondary | ICD-10-CM

## 2023-04-18 DIAGNOSIS — E663 Overweight: Secondary | ICD-10-CM

## 2023-04-18 DIAGNOSIS — R0602 Shortness of breath: Secondary | ICD-10-CM | POA: Diagnosis not present

## 2023-04-18 MED ORDER — NITROGLYCERIN 0.4 MG SL SUBL
SUBLINGUAL_TABLET | SUBLINGUAL | Status: AC
  Filled 2023-04-18: qty 2

## 2023-04-18 MED ORDER — WEGOVY 0.25 MG/0.5ML ~~LOC~~ SOAJ: 0.2500 mg | SUBCUTANEOUS | 0 refills | Status: DC

## 2023-04-18 MED ORDER — NITROGLYCERIN 0.4 MG SL SUBL
0.8000 mg | SUBLINGUAL_TABLET | SUBLINGUAL | Status: DC | PRN
  Administered 2023-04-18: 0.8 mg via SUBLINGUAL

## 2023-04-18 MED ORDER — IOHEXOL 350 MG/ML SOLN
95.0000 mL | Freq: Once | INTRAVENOUS | Status: AC | PRN
  Administered 2023-04-18: 95 mL via INTRAVENOUS

## 2023-04-18 NOTE — Progress Notes (Signed)
Patient tolerated procedure without distress 

## 2023-04-23 ENCOUNTER — Encounter: Payer: Self-pay | Admitting: Family Medicine

## 2023-04-23 NOTE — Telephone Encounter (Signed)
Please advise 

## 2023-04-25 ENCOUNTER — Other Ambulatory Visit: Payer: Medicare Other

## 2023-05-13 ENCOUNTER — Encounter: Payer: Self-pay | Admitting: Family Medicine

## 2023-05-14 ENCOUNTER — Other Ambulatory Visit: Payer: Self-pay | Admitting: *Deleted

## 2023-05-14 MED ORDER — WEGOVY 0.5 MG/0.5ML ~~LOC~~ SOAJ: 0.5000 mg | SUBCUTANEOUS | 1 refills | Status: DC

## 2023-05-14 NOTE — Telephone Encounter (Signed)
Please advise 

## 2023-05-14 NOTE — Telephone Encounter (Signed)
I appreciate the update. We can increase the dose to 0.5 mg weekly.  She should follow-up with [place] again in a few weeks.

## 2023-05-31 ENCOUNTER — Encounter (INDEPENDENT_AMBULATORY_CARE_PROVIDER_SITE_OTHER): Payer: Self-pay

## 2023-06-07 ENCOUNTER — Other Ambulatory Visit: Payer: Self-pay | Admitting: Family Medicine

## 2023-06-11 ENCOUNTER — Encounter: Payer: Self-pay | Admitting: Family Medicine

## 2023-06-11 MED ORDER — WEGOVY 0.5 MG/0.5ML ~~LOC~~ SOAJ: 0.5000 mg | SUBCUTANEOUS | 0 refills | Status: DC

## 2023-06-26 ENCOUNTER — Ambulatory Visit: Payer: Medicare Other | Admitting: Family Medicine

## 2023-06-29 ENCOUNTER — Encounter: Payer: Self-pay | Admitting: Family Medicine

## 2023-06-29 ENCOUNTER — Ambulatory Visit (INDEPENDENT_AMBULATORY_CARE_PROVIDER_SITE_OTHER): Payer: Medicare Other | Admitting: Family Medicine

## 2023-06-29 VITALS — BP 119/76 | HR 84 | Temp 97.8°F | Ht 60.25 in | Wt 136.6 lb

## 2023-06-29 DIAGNOSIS — M549 Dorsalgia, unspecified: Secondary | ICD-10-CM | POA: Diagnosis not present

## 2023-06-29 DIAGNOSIS — E2839 Other primary ovarian failure: Secondary | ICD-10-CM

## 2023-06-29 DIAGNOSIS — M8580 Other specified disorders of bone density and structure, unspecified site: Secondary | ICD-10-CM

## 2023-06-29 DIAGNOSIS — E663 Overweight: Secondary | ICD-10-CM

## 2023-06-29 DIAGNOSIS — I10 Essential (primary) hypertension: Secondary | ICD-10-CM

## 2023-06-29 DIAGNOSIS — G8929 Other chronic pain: Secondary | ICD-10-CM

## 2023-06-29 DIAGNOSIS — Z23 Encounter for immunization: Secondary | ICD-10-CM | POA: Diagnosis not present

## 2023-06-29 MED ORDER — WEGOVY 1 MG/0.5ML ~~LOC~~ SOAJ: 1.0000 mg | SUBCUTANEOUS | 0 refills | Status: DC

## 2023-06-29 NOTE — Patient Instructions (Signed)
It was very nice to see you today!  We will increase your Wegovy to 1 mg weekly.  Let me know if she weeks how you are doing if you have any side effects.  I will refer you to orthopedics for your back pain.  Will also try to get you started on Prolia but you can discuss this with the orthopedist as well.  Return in about 3 months (around 09/28/2023) for Follow Up.   Take care, Dr Jimmey Ralph  PLEASE NOTE:  If you had any lab tests, please let us know if you have not heard back within a few days. You may see your results on mychart before we have a chance to review them but we will give you a call once they are reviewed by [place].   If we ordered any referrals today, please let us know if you have not heard from their office within the next week.   If you had any urgent prescriptions sent in today, please check with the pharmacy within an hour of our visit to make sure the prescription was transmitted appropriately.   Please try these tips to maintain a healthy lifestyle:  Eat at least 3 REAL meals and 1-2 snacks per day.  Aim for no more than 5 hours between eating.  If you eat breakfast, please do so within one hour of getting up.   Each meal should contain half fruits/vegetables, one quarter protein, and one quarter carbs (no bigger than a computer mouse)  Cut down on sweet beverages. This includes juice, soda, and sweet tea.   Drink at least 1 glass of water with each meal and aim for at least 8 glasses per day  Exercise at least 150 minutes every week.

## 2023-06-29 NOTE — Assessment & Plan Note (Signed)
BMI improved to 26.46 today.  She is doing well with Wegovy.  Will increase dose to 1 mg weekly.  She is aware of potential side effects.  She will follow-up in a few weeks via MyChart.  She would like to lose about another 10 pounds or so.  She will come back for in person office visit in 3 months.  We can titrate the dose as needed.  Discussed with patient that while she has her goal weight we can maintain current dose for 3 to 6 months before trialing off.  She will continue to work on diet and exercise as well.

## 2023-06-29 NOTE — Progress Notes (Signed)
   Denise Payne is a 77 y.o. female who presents today for an office visit.  Assessment/Plan:  Chronic Problems Addressed Today: Overweight BMI improved to 26.46 today.  She is doing well with Wegovy.  Will increase dose to 1 mg weekly.  She is aware of potential side effects.  She will follow-up in a few weeks via MyChart.  She would like to lose about another 10 pounds or so.  She will come back for in person office visit in 3 months.  We can titrate the dose as needed.  Discussed with patient that while she has her goal weight we can maintain current dose for 3 to 6 months before trialing off.  She will continue to work on diet and exercise as well.  Osteopenia Last DEXA with T-score -2.2.  Will reorder this today.  She does have evidence of compression fractures on recent imaging and does meet criteria for medical management for her osteopenia.  She has not tolerated Fosamax in the past and does not wish to try any other bisphosphonates.  We will try to get her set up for Prolia.  Also discussed that she can discuss this with her orthopedist as well.  Benign essential HTN Blood pressure at goal today on metoprolol tartrate 50 mg twice daily and diltiazem 180 mg daily.  Flu shot given today.    Subjective:  HPI:  See A/P for status of chronic conditions.  Patient is here today for follow-up.  We last saw her 3 months ago for management for her weight.  She was able to get improved for [facility] via her insurance.  She has been on this for the last several months.  Currently on 0.5 mg weekly.  Doing well this.  Down about 14 pounds since her last visit.  Initially had some mild nausea however this improved.  Having occasional diarrhea but not sure if this is due to the medications but also having more runny nose but this is manageable as well.  She is also been having issues with recurrent back pain.  This been going on for several years.  She seen orthopedics in the past.  She has been  diagnosed with multiple compression fractures as well as a bulging disc.  Pain is not improving with home treatments and over-the-counter medications.  No reported weakness or numbness.  No reported urinary retention.  No reported bowel or bladder incontinence.       Objective:  Physical Exam: BP 119/76   Pulse 84   Temp 97.8 F (36.6 C) (Temporal)   Ht 5' 0.25" (1.53 m)   Wt 136 lb 9.6 oz (62 kg)   SpO2 100%   BMI 26.46 kg/m   Wt Readings from Last 3 Encounters:  06/29/23 136 lb 9.6 oz (62 kg)  04/12/23 150 lb 4 oz (68.2 kg)  04/11/23 150 lb (68 kg)    Gen: No acute distress, resting comfortably Neuro: Grossly normal, moves all extremities Psych: Normal affect and thought content      [name] M. Jimmey Ralph, MD 06/29/2023 10:43 AM

## 2023-06-29 NOTE — Assessment & Plan Note (Signed)
Last DEXA with T-score -2.2.  Will reorder this today.  She does have evidence of compression fractures on recent imaging and does meet criteria for medical management for her osteopenia.  She has not tolerated Fosamax in the past and does not wish to try any other bisphosphonates.  We will try to get her set up for Prolia.  Also discussed that she can discuss this with her orthopedist as well.

## 2023-06-29 NOTE — Assessment & Plan Note (Signed)
Blood pressure at goal today on metoprolol tartrate 50 mg twice daily and diltiazem 180 mg daily.

## 2023-07-12 ENCOUNTER — Other Ambulatory Visit (INDEPENDENT_AMBULATORY_CARE_PROVIDER_SITE_OTHER): Payer: Medicare Other

## 2023-07-12 ENCOUNTER — Ambulatory Visit: Payer: Medicare Other | Admitting: Orthopedic Surgery

## 2023-07-12 VITALS — BP 110/72 | HR 89 | Ht 60.25 in | Wt 137.0 lb

## 2023-07-12 DIAGNOSIS — G8929 Other chronic pain: Secondary | ICD-10-CM

## 2023-07-12 DIAGNOSIS — M810 Age-related osteoporosis without current pathological fracture: Secondary | ICD-10-CM | POA: Diagnosis not present

## 2023-07-12 DIAGNOSIS — M545 Low back pain, unspecified: Secondary | ICD-10-CM

## 2023-07-12 NOTE — Progress Notes (Signed)
Orthopedic Spine Surgery Office Note  Assessment: Patient is a 77 y.o. female with chronic low back pain. No radicular symptoms. Has DDD with vacuum disc phenomenon at    Plan: -Explained that she had a compression fracture with no significant trauma which gives her the diagnosis of osteoporosis. She does not need a repeat DEXA scan for the diagnosis.Referred her to the osteoporosis clinic -Encouraged core strengthening. Told her she could use a over-the-counter back brace for more active days but should not use it regularly as it can weaken her core -Can use tylenol for everyday pain -If she has a flare of pain, told her to call the office and I will prescribe her a medrol dose pak -Patient has tried Tylenol, Flexeril -Patient should return to office on an as-needed basis   Patient expressed understanding of the plan and all questions were answered to the patient's satisfaction.   ___________________________________________________________________________   History:  Patient is a 77 y.o. female who presents today for lumbar spine.  Patient has had several years of low back pain that has gotten progressively worse with time.  Normally, she rates the pain as a 2 out of 10 but it can get up to as bad as a 7 out of 10.  It is generally worse with upright activity.  She gives the example of cooking or lifting heavier objects.  She feels that gets better if she sits down or rest.  She does not have any pain radiating into either lower extremity.  Denies paresthesias or numbness.  There is no trauma or injury that preceded the onset of pain.  Reports being told in the past that she had compression fractures at T10 and T11.  She does not recall any trauma before being diagnosed with those fractures.  Pain is localized in her upper lumbar and lower thoracic spine.   Weakness: Denies Symptoms of imbalance: Denies Paresthesias and numbness: Denies Bowel or bladder incontinence: Denies Saddle  anesthesia: Denies  Treatments tried: Tylenol, Flexeril  Review of systems: Denies fevers and chills, night sweats, unexplained weight loss, history of cancer.  Has had pain that wakes her at night  Past medical history: Sjogren's HTN GERD Atrial fibrillation on Eliquis Post-cholecystectomy syndrome Dupuytren's   Allergies: latex, meperidine, morphine  Past surgical history:  Right trimalleolar ankle fracture ORIF Tonsillectomy and adenoidectomy Hysterectomy Sigmoidectomy Cholecystectomy Right wrist fracture ORIF Atrial flutter ablation  Social history: Denies use of nicotine product (smoking, vaping, patches, smokeless) Alcohol use: Yes, approximately 3-4 drinks per week Denies recreational drug use   Physical Exam:  BMI of 26.5  General: no acute distress, appears stated age Neurologic: alert, answering questions appropriately, following commands Respiratory: unlabored breathing on room air, symmetric chest rise Psychiatric: appropriate affect, normal cadence to speech   MSK (spine):  -Strength exam      Left  Right EHL    5/5  5/5 TA    5/5  5/5 GSC    5/5  5/5 Knee extension  5/5  5/5 Hip flexion   5/5  5/5  -Sensory exam    Sensation intact to light touch in L3-S1 nerve distributions of bilateral lower extremities  -Achilles DTR: 2/4 on the left, 2/4 on the right -Patellar tendon DTR: 2/4 on the left, 2/4 on the right  -Straight leg raise: negative bilaterally -Clonus: no beats bilaterally  -Left hip exam: no pain through range of motion, negative stinchfield, negative faber -Right hip exam: no pain through range of motion, negative stinchfield, negative  faber  Imaging: XRs of the lumbar spine from 07/12/2023 were independently reviewed and interpreted, showing disc height loss at L2/3 and L4/5. Endplate sclerosis seen at L2/3. Compression fracture seen at T10 and T11. No other fracture seen. No evidence of instability on flexion/extension views.    CT from 02/08/2023 was independently reviewed and interpreted, showing sclerotic endplates at L2/3 with vacuum disc phenomenon. Vacuum disc phenomenon also seen at L1/2. Compression fracture seen at T10 and T11 with well corticated margins. Calcified disc herniation at L2/3. Facet arthropathy at multiple levels but worst at  L1/2, L2/3. Sclerotic bone lesion near the anterior aspect of L5 that was present and unchanged since CT on 12/06/2020.    Patient name: Denise Payne Patient MRN: [id number] Date of visit: 07/12/23

## 2023-07-18 ENCOUNTER — Other Ambulatory Visit: Payer: Self-pay | Admitting: Family Medicine

## 2023-07-23 ENCOUNTER — Ambulatory Visit (INDEPENDENT_AMBULATORY_CARE_PROVIDER_SITE_OTHER): Payer: Medicare Other | Admitting: Physician Assistant

## 2023-07-23 ENCOUNTER — Encounter: Payer: Self-pay | Admitting: Family Medicine

## 2023-07-23 ENCOUNTER — Encounter: Payer: Self-pay | Admitting: Physician Assistant

## 2023-07-23 VITALS — BP 138/78 | HR 72 | Ht 62.0 in | Wt 131.0 lb

## 2023-07-23 DIAGNOSIS — M3500 Sicca syndrome, unspecified: Secondary | ICD-10-CM | POA: Diagnosis not present

## 2023-07-23 DIAGNOSIS — M810 Age-related osteoporosis without current pathological fracture: Secondary | ICD-10-CM | POA: Diagnosis not present

## 2023-07-23 NOTE — Telephone Encounter (Signed)
Please see patient message and advise.

## 2023-07-23 NOTE — Telephone Encounter (Signed)
We can order a DEXA scan and do it with  on elam.  Oretta Berkland M. Jimmey Ralph, MD 07/23/2023 3:04 PM

## 2023-07-23 NOTE — Addendum Note (Signed)
Addended by: Michaele Offer on: 07/23/2023 11:17 AM   Modules accepted: Orders

## 2023-07-23 NOTE — Progress Notes (Signed)
Office Visit Note   Patient: Denise Payne           Date of Birth: 03/25/1946           MRN: [id number] Visit Date: 07/23/2023              Requested by: London Sheer, MD [address] [place],  [place] [address] PCP: Ardith Dark, MD   Assessment & Plan: Visit Diagnoses:  1. Age-related osteoporosis without current pathological fracture     Plan: Geddes is a pleasant 77 year old woman who is quite active and enjoys traveling.  She was referred by Dr. Christell Constant for a history of compression fractures of T10 and T11.  She had a bone density scan a few years ago that demonstrated osteopenia.  She also has a history of wrist fractures in 2019 and 2023.  She is on Eliquis for history of a flutter and A-fib but does not have any history of heart disease or stroke.  She has a history of skin cancer.  No history of kidney disease gastric or peptic ulcer.  No history of gastric bypass surgery.  She does have significant reflux and difficulty swallowing pills.  She does have a history of being on Fosamax a few years ago but had significant issues with her jaw and so discontinued this.  She went through menopause at age 11 after hysterectomy and was on hormone replacement therapy for a while after that.  She does not take calcium because she has difficulty swallowing pills.  She takes a maintenance dose of 2000 units of vitamin D daily but has not had her vitamin D checked in quite a while.  Does have a history of vitamin D deficiency.  She is not a smoker she drinks minimally she does walking sporadically.  She does see her dentist on a regular basis and has a history of osteoporosis in her father.  Based on all this she does have a diagnosis of osteoporosis however we have no recent bone scan.  Would like to see if this has fallen now into an osteoporosis category.  She is interested in treatment but specifically Prolia.  Her sister is on this and she is says the side effect profile with jaw issues  seems to be less than the bisphosphonates which was what she was on when she started having difficulties.  I would like her to go forward with her DEXA scan can follow-up with me after this..  30 to 45 minutes was spent with the patient going over her history as well as previous treatments and risk factors.  Discussion of possible treatments and outcomes and expectations.  Follow-Up Instructions: After labs and DEXA scan  Orders:  No orders of the defined types were placed in this encounter.  No orders of the defined types were placed in this encounter.     Procedures: No procedures performed   Clinical Data: No additional findings.   Subjective: No chief complaint on file.   HPI Albaugh is a pleasant 77 year old woman who is quite active she comes in today referral from Dr. Christell Constant for evaluation of osteoporosis and possible treatments.  She has had several fragility fractures over the last few years and has a history of family history of osteoporosis.  Last bone DEXA was a few years ago demonstrated osteopenia.  She was on Fosamax for a while but was unable to tolerate it and had reactions into her jaw 5 years after commencing therapy  Review of  Systems  All other systems reviewed and are negative.    Objective: Vital Signs: There were no vitals taken for this visit.  Physical Exam Constitutional:      Appearance: Normal appearance.  Pulmonary:     Effort: Pulmonary effort is normal.  Skin:    General: Skin is warm and dry.  Neurological:     General: No focal deficit present.     Mental Status: She is alert and oriented to person, place, and time.     Ortho Exam  Specialty Comments:  No specialty comments available.  Imaging: No results found.   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   Benign essential HTN 04/12/2020   GERD (gastroesophageal reflux disease) 03/12/2020   Osteopenia 03/12/2020   Asthma 01/26/2019   Sjogren's syndrome (HCC) 01/26/2019   Past Medical History:  Diagnosis Date   Anemia    Asthma    Atrial fibrillation (HCC)    Cataract    Gallstones    GERD (gastroesophageal reflux disease)    High blood pressure    Osteoporosis    Sjogren's syndrome (HCC) 01/26/2019    Family History  Problem Relation Age of Onset   Breast cancer Mother 80   Hearing loss Mother    Heart disease Mother    High Cholesterol Mother    Hypertension Mother    Cancer Father    Hearing loss Father    Stroke Father    Transient ischemic attack Father    Arthritis Maternal Grandmother    Cancer Maternal Grandmother    Early death Maternal Grandmother    Diabetes Maternal Grandfather    Early death Maternal Grandfather    Hearing loss Maternal Grandfather    Heart attack Maternal Grandfather    Heart disease Maternal Grandfather    Arthritis Paternal Grandmother    Early death Paternal Grandmother    Hearing loss Paternal Grandmother    Hyperlipidemia Paternal Grandmother    Early death Paternal Grandfather    Kidney disease Paternal Grandfather    Cancer Sister    Hyperlipidemia Sister    Hypertension Sister    Birth defects Brother    Cancer Brother    Hyperlipidemia Brother     Past Surgical History:  Procedure Laterality Date   A-FLUTTER ABLATION N/A 05/17/2022   Procedure: A-FLUTTER ABLATION;  Surgeon: Marinus Maw, MD;  Location: [facility];  Service: Cardiovascular;  Laterality: N/A;   ABDOMINAL HYSTERECTOMY  1996   ANKLE FRACTURE SURGERY  2005   AUGMENTATION MAMMAPLASTY  Bilateral 10/16/1976   CHOLECYSTECTOMY N/A 12/07/2020   Procedure: LAPAROSCOPIC CHOLECYSTECTOMY WITH INTRAOPERATIVE CHOLANGIOGRAM;  Surgeon: Sheliah Hatch De Blanch, MD;  Location: [facility];  Service: General;  Laterality: N/A;   CYSTOSCOPY WITH STENT PLACEMENT N/A 05/22/2019   Procedure: CYSTOSCOPY WITH STENT PLACEMENT;  Surgeon: Bjorn Pippin, MD;  Location: [facility];  Service: Urology;  Laterality: N/A;   FLEXIBLE SIGMOIDOSCOPY N/A 05/22/2019   Procedure: FLEXIBLE SIGMOIDOSCOPY;  Surgeon: Andria Meuse, MD;  Location: [facility];  Service: General;  Laterality: N/A;   XI ROBOTIC ASSISTED LOWER ANTERIOR RESECTION Bilateral 05/22/2019  Procedure: XI ROBOTIC ASSISTED SIGMOIDECTOMY;  Surgeon: Andria Meuse, MD;  Location: [facility];  Service: General;  Laterality: Bilateral;   Social History   Occupational History   Occupation: retired  Tobacco Use   Smoking status: Never   Smokeless tobacco: Never  Vaping Use   Vaping status: Never Used  Substance and Sexual Activity   Alcohol use: Yes    Alcohol/week: 8.0 standard drinks of alcohol    Types: 8 Glasses of wine per week    Comment: 3 times daily   Drug use: Never   Sexual activity: Not Currently

## 2023-07-24 ENCOUNTER — Other Ambulatory Visit: Payer: Self-pay | Admitting: *Deleted

## 2023-07-24 DIAGNOSIS — M8580 Other specified disorders of bone density and structure, unspecified site: Secondary | ICD-10-CM

## 2023-07-24 DIAGNOSIS — E2839 Other primary ovarian failure: Secondary | ICD-10-CM

## 2023-07-24 NOTE — Progress Notes (Signed)
Denise Payne

## 2023-07-26 LAB — TSH: TSH: 1.49 m[IU]/L (ref 0.40–4.50)

## 2023-07-26 LAB — EXTRA SPECIMEN

## 2023-07-26 LAB — PROTEIN ELECTROPHORESIS, SERUM
Albumin ELP: 4.1 g/dL (ref 3.8–4.8)
Alpha 1: 0.2 g/dL (ref 0.2–0.3)
Alpha 2: 0.7 g/dL (ref 0.5–0.9)
Beta 2: 0.3 g/dL (ref 0.2–0.5)
Beta Globulin: 0.4 g/dL (ref 0.4–0.6)
Gamma Globulin: 0.7 g/dL — ABNORMAL LOW (ref 0.8–1.7)
Total Protein: 6.6 g/dL (ref 6.1–8.1)

## 2023-07-26 LAB — VITAMIN D 25 HYDROXY (VIT D DEFICIENCY, FRACTURES): Vit D, 25-Hydroxy: 87 ng/mL (ref 30–100)

## 2023-07-26 LAB — PARATHYROID HORMONE, INTACT (NO CA): PTH: 57 pg/mL (ref 16–77)

## 2023-07-30 ENCOUNTER — Encounter: Payer: Self-pay | Admitting: Family Medicine

## 2023-07-31 ENCOUNTER — Ambulatory Visit (INDEPENDENT_AMBULATORY_CARE_PROVIDER_SITE_OTHER)
Admission: RE | Admit: 2023-07-31 | Discharge: 2023-07-31 | Disposition: A | Discharge status: Home / Self Care | Payer: Medicare Other | Source: Ambulatory Visit | Attending: Family Medicine | Admitting: Family Medicine

## 2023-07-31 DIAGNOSIS — E2839 Other primary ovarian failure: Secondary | ICD-10-CM

## 2023-07-31 DIAGNOSIS — M8580 Other specified disorders of bone density and structure, unspecified site: Secondary | ICD-10-CM | POA: Diagnosis not present

## 2023-07-31 NOTE — Telephone Encounter (Signed)
Please clarify current dose. It looks like she is on 1 mg weekly.  If so is okay for her to continue this.  Please send in prescription if needed.

## 2023-07-31 NOTE — Telephone Encounter (Signed)
Ok to send new prescription to [facility] with refills? Please advise instructions for patient maintenance

## 2023-08-01 ENCOUNTER — Other Ambulatory Visit: Payer: Self-pay | Admitting: *Deleted

## 2023-08-01 ENCOUNTER — Other Ambulatory Visit: Payer: Medicare Other

## 2023-08-01 DIAGNOSIS — H5212 Myopia, left eye: Secondary | ICD-10-CM | POA: Diagnosis not present

## 2023-08-01 DIAGNOSIS — H04123 Dry eye syndrome of bilateral lacrimal glands: Secondary | ICD-10-CM | POA: Diagnosis not present

## 2023-08-01 DIAGNOSIS — Z961 Presence of intraocular lens: Secondary | ICD-10-CM | POA: Diagnosis not present

## 2023-08-01 DIAGNOSIS — H5201 Hypermetropia, right eye: Secondary | ICD-10-CM | POA: Diagnosis not present

## 2023-08-01 MED ORDER — WEGOVY 1 MG/0.5ML ~~LOC~~ SOAJ: 1.0000 mg | SUBCUTANEOUS | 1 refills | Status: DC

## 2023-08-01 NOTE — Telephone Encounter (Signed)
Recommend every 7 days for now however recommend that she continue to weigh herself daily.  If she does start to drop below her goal weight then we can space out at that time.  Katina Degree. Jimmey Ralph, MD 08/01/2023 10:51 AM

## 2023-08-01 NOTE — Telephone Encounter (Signed)
MVHQIO Rx send to pharmacy  See Pt note

## 2023-08-02 ENCOUNTER — Other Ambulatory Visit: Payer: Self-pay | Admitting: *Deleted

## 2023-08-02 MED ORDER — WEGOVY 1 MG/0.5ML ~~LOC~~ SOAJ: 1.0000 mg | SUBCUTANEOUS | 1 refills | Status: DC

## 2023-08-02 NOTE — Progress Notes (Signed)
Bone density scan shows that she does have osteoporosis.  This is slightly worse than last time that we checked.  We discussed getting her on Prolia at her most recent office visit-can we check in on this.  She should make sure that she is continue with calcium and vitamin D intake as we discussed at her last office visit.  We can repeat bone density scan in 2 years.

## 2023-08-06 ENCOUNTER — Other Ambulatory Visit: Payer: Self-pay | Admitting: Family Medicine

## 2023-08-13 ENCOUNTER — Telehealth: Payer: Self-pay | Admitting: Physician Assistant

## 2023-08-13 NOTE — Telephone Encounter (Signed)
Patient called and was wanted to know the results. She haven't heard anything since then. CB#[phone]

## 2023-08-13 NOTE — Telephone Encounter (Signed)
Spoke to patient and let her know I would check on her results and we would call her back.  Sent message to Dorene Grebe to schedule patient on Monday morning per [facility] in an Osteoporosis slot for 45 minutes

## 2023-08-27 ENCOUNTER — Encounter: Payer: Self-pay | Admitting: Family Medicine

## 2023-08-27 ENCOUNTER — Other Ambulatory Visit: Payer: Self-pay

## 2023-08-27 ENCOUNTER — Other Ambulatory Visit: Payer: Self-pay | Admitting: Family Medicine

## 2023-08-27 DIAGNOSIS — Z1231 Encounter for screening mammogram for malignant neoplasm of breast: Secondary | ICD-10-CM

## 2023-08-27 MED ORDER — WEGOVY 1 MG/0.5ML ~~LOC~~ SOAJ: 1.0000 mg | SUBCUTANEOUS | 1 refills | Status: DC

## 2023-08-28 NOTE — Telephone Encounter (Signed)
It is ok to send in [facility] 1 mg weekly. She can try extending to every 10 days.  Katina Degree. Jimmey Ralph, MD 08/28/2023 10:57 AM

## 2023-08-30 ENCOUNTER — Ambulatory Visit
Admission: RE | Admit: 2023-08-30 | Discharge: 2023-08-30 | Disposition: A | Discharge status: Home / Self Care | Payer: Medicare Other | Source: Ambulatory Visit

## 2023-08-30 DIAGNOSIS — Z1231 Encounter for screening mammogram for malignant neoplasm of breast: Secondary | ICD-10-CM

## 2023-09-03 ENCOUNTER — Ambulatory Visit (INDEPENDENT_AMBULATORY_CARE_PROVIDER_SITE_OTHER): Payer: Medicare Other | Admitting: Physician Assistant

## 2023-09-03 DIAGNOSIS — M810 Age-related osteoporosis without current pathological fracture: Secondary | ICD-10-CM

## 2023-09-03 NOTE — Progress Notes (Signed)
Office Visit Note   Patient: Denise Payne           Date of Birth: [birth year]           MRN: [id number] Visit Date: 09/03/2023              Requested by: Ardith Dark, MD [address] [place],  [place] [address] PCP: Ardith Dark, MD  Osteoporosis    HPI: Patient is a pleasant 77 year old woman who follows up today for removal of her right renal stent bone scan she has a history of fragility fractures.  She was referred by Dr. Christell Constant for compression fractures T10-T11.  She has taken Fosamax in the past but began to have jaw aching that was never diagnosed with any type of osteonecrosis.  Seem to resolve after she stopped taking this.  She also has significant reflux  Assessment & Plan: Visit Diagnoses: No diagnosis found.  Plan: Osteoporosis had a long discussion with her.  We discussed that there is still risks of osteonecrosis of the jaw with both Prolia and Evenity.  She is interested in doing some bone buildup prior to being on Prolia as she is quite active and travels quite a bit.  We also discussed Forteo as this is 1 that would create no risk.  She is placed in the osteoporosis based on her T-score which is -2.6 she does not want to have to travel with the medication at that has to be refrigerated at any point.  We will go forward and try to authorize Evenity.  She could arrange this right before and right when she comes back from [place]  Follow-Up Instructions: Will call  Ortho Exam  Patient is alert, oriented, no adenopathy, well-dressed, normal affect, normal respiratory effort.   Imaging: No results found. No images are attached to the encounter.  Labs: Lab Results  Component Value Date   HGBA1C 5.2 12/27/2021   HGBA1C 5.1 05/20/2019   ESRSEDRATE 10 02/05/2023   CRP <1.0 02/05/2023   REPTSTATUS 12/11/2020 FINAL 12/06/2020   CULT  12/06/2020    NO GROWTH 5 DAYS Performed at [facility], [address], [place], [place] [address]      Lab  Results  Component Value Date   ALBUMIN 4.0 02/05/2023   ALBUMIN 3.6 01/25/2022   ALBUMIN 4.2 12/27/2021   PREALBUMIN 5.9 (L) 02/04/2019    Lab Results  Component Value Date   MG 1.8 01/25/2022   MG 1.4 (L) 02/19/2019   MG 2.3 02/07/2019   Lab Results  Component Value Date   VD25OH 87 07/23/2023    Lab Results  Component Value Date   PREALBUMIN 5.9 (L) 02/04/2019      Latest Ref Rng & Units 02/05/2023   11:27 AM 04/24/2022    2:27 PM 01/26/2022    1:28 AM  CBC EXTENDED  WBC 4.0 - 10.5 K/uL 6.7  6.8  7.8   RBC 3.87 - 5.11 Mil/uL 4.03  4.17  4.27   Hemoglobin 12.0 - 15.0 g/dL 82.9  56.2  13.0   HCT 36.0 - 46.0 % 35.2  36.1  38.2   Platelets 150.0 - 400.0 K/uL 222.0  277  244   NEUT# 1.4 - 7.7 K/uL 4.4  4.6    Lymph# 0.7 - 4.0 K/uL 1.6  1.4       There is no height or weight on file to calculate BMI.  Orders:  No orders of the defined types were placed  in this encounter.  No orders of the defined types were placed in this encounter.    Procedures: No procedures performed  Clinical Data: No additional findings.  ROS:  All other systems negative, except as noted in the HPI. Review of Systems  Objective: Vital Signs: There were no vitals taken for this visit.  Specialty Comments:  No specialty comments available.  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   Benign essential HTN 04/12/2020   GERD (gastroesophageal reflux disease) 03/12/2020   Osteopenia 03/12/2020   Asthma 01/26/2019   Sjogren's syndrome (HCC)  01/26/2019   Past Medical History:  Diagnosis Date   Anemia    Asthma    Atrial fibrillation (HCC)    Cataract    Gallstones    GERD (gastroesophageal reflux disease)    High blood pressure    Osteoporosis    Sjogren's syndrome (HCC) 01/26/2019    Family History  Problem Relation Age of Onset   Breast cancer Mother 31   Hearing loss Mother    Heart disease Mother    High Cholesterol Mother    Hypertension Mother    Cancer Father    Hearing loss Father    Stroke Father    Transient ischemic attack Father    Arthritis Maternal Grandmother    Cancer Maternal Grandmother    Early death Maternal Grandmother    Diabetes Maternal Grandfather    Early death Maternal Grandfather    Hearing loss Maternal Grandfather    Heart attack Maternal Grandfather    Heart disease Maternal Grandfather    Arthritis Paternal Grandmother    Early death Paternal Grandmother    Hearing loss Paternal Grandmother    Hyperlipidemia Paternal Grandmother    Early death Paternal Grandfather    Kidney disease Paternal Grandfather    Cancer Sister    Hyperlipidemia Sister    Hypertension Sister    Birth defects Brother    Cancer Brother    Hyperlipidemia Brother     Past Surgical History:  Procedure Laterality Date   A-FLUTTER ABLATION N/A 05/17/2022   Procedure: A-FLUTTER ABLATION;  Surgeon: Marinus Maw, MD;  Location: [facility];  Service: Cardiovascular;  Laterality: N/A;   ABDOMINAL HYSTERECTOMY  1996   ANKLE FRACTURE SURGERY  2005   AUGMENTATION MAMMAPLASTY Bilateral 10/16/1976   CHOLECYSTECTOMY N/A 12/07/2020   Procedure: LAPAROSCOPIC CHOLECYSTECTOMY WITH INTRAOPERATIVE CHOLANGIOGRAM;  Surgeon: Sheliah Hatch De Blanch, MD;  Location: [facility];  Service: General;  Laterality: N/A;   CYSTOSCOPY WITH STENT PLACEMENT N/A 05/22/2019   Procedure: CYSTOSCOPY WITH STENT PLACEMENT;  Surgeon: Bjorn Pippin, MD;  Location: [facility];  Service: Urology;  Laterality: N/A;   FLEXIBLE SIGMOIDOSCOPY N/A  05/22/2019   Procedure: FLEXIBLE SIGMOIDOSCOPY;  Surgeon: Andria Meuse, MD;  Location: [facility];  Service: General;  Laterality: N/A;   XI ROBOTIC ASSISTED LOWER ANTERIOR RESECTION Bilateral 05/22/2019   Procedure: XI ROBOTIC ASSISTED SIGMOIDECTOMY;  Surgeon: Andria Meuse, MD;  Location: [facility];  Service: General;  Laterality: Bilateral;   Social History   Occupational History   Occupation: retired  Tobacco Use   Smoking  status: Never   Smokeless tobacco: Never  Vaping Use   Vaping status: Never Used  Substance and Sexual Activity   Alcohol use: Yes    Alcohol/week: 8.0 standard drinks of alcohol    Types: 8 Glasses of wine per week    Comment: 3 times daily   Drug use: Never   Sexual activity: Not Currently

## 2023-09-04 ENCOUNTER — Telehealth: Payer: Self-pay

## 2023-09-04 DIAGNOSIS — Z6822 Body mass index (BMI) 22.0-22.9, adult: Secondary | ICD-10-CM | POA: Diagnosis not present

## 2023-09-04 DIAGNOSIS — M5136 Other intervertebral disc degeneration, lumbar region with discogenic back pain only: Secondary | ICD-10-CM | POA: Diagnosis not present

## 2023-09-04 DIAGNOSIS — M3501 Sicca syndrome with keratoconjunctivitis: Secondary | ICD-10-CM | POA: Diagnosis not present

## 2023-09-04 DIAGNOSIS — M1991 Primary osteoarthritis, unspecified site: Secondary | ICD-10-CM | POA: Diagnosis not present

## 2023-09-04 DIAGNOSIS — R5383 Other fatigue: Secondary | ICD-10-CM | POA: Diagnosis not present

## 2023-09-04 DIAGNOSIS — R600 Localized edema: Secondary | ICD-10-CM | POA: Diagnosis not present

## 2023-09-04 DIAGNOSIS — M25561 Pain in right knee: Secondary | ICD-10-CM | POA: Diagnosis not present

## 2023-09-04 NOTE — Telephone Encounter (Signed)
-----   Message from [facility] Latoria L sent at 09/03/2023  1:54 PM EST ----- Regarding: [employer] Can we please have this patient approved.  Thank you

## 2023-09-04 NOTE — Telephone Encounter (Signed)
Submitted online through [employer] portal for [employer]

## 2023-09-05 ENCOUNTER — Other Ambulatory Visit: Payer: Self-pay | Admitting: Family Medicine

## 2023-09-05 DIAGNOSIS — N6320 Unspecified lump in the left breast, unspecified quadrant: Secondary | ICD-10-CM

## 2023-09-05 DIAGNOSIS — Z79899 Other long term (current) drug therapy: Secondary | ICD-10-CM | POA: Diagnosis not present

## 2023-09-05 DIAGNOSIS — H35363 Drusen (degenerative) of macula, bilateral: Secondary | ICD-10-CM | POA: Diagnosis not present

## 2023-09-18 ENCOUNTER — Ambulatory Visit (INDEPENDENT_AMBULATORY_CARE_PROVIDER_SITE_OTHER): Payer: Medicare Other

## 2023-09-18 VITALS — Wt 131.0 lb

## 2023-09-18 DIAGNOSIS — Z0000 Encounter for general adult medical examination without abnormal findings: Secondary | ICD-10-CM | POA: Diagnosis not present

## 2023-09-18 NOTE — Patient Instructions (Signed)
Denise Payne , Thank you for taking time to come for your Medicare Wellness Visit. I appreciate your ongoing commitment to your health goals. Please review the following plan we discussed and let me know if I can assist you in the future.   Referrals/Orders/Follow-Ups/Clinician Recommendations: maintain health and activity   This is a list of the screening recommended for you and due dates:  Health Maintenance  Topic Date Due   COVID-19 Vaccine (6 - 2023-24 season) 06/17/2023   Medicare Annual Wellness Visit  09/15/2023   DTaP/Tdap/Td vaccine (2 - Td or Tdap) 04/25/2026   Pneumonia Vaccine  Completed   Flu Shot  Completed   DEXA scan (bone density measurement)  Completed   Hepatitis C Screening  Completed   Zoster (Shingles) Vaccine  Completed   HPV Vaccine  Aged Out   Colon Cancer Screening  Discontinued    Advanced directives: (Copy Requested) Please bring a copy of your health care power of attorney and living will to the office to be added to your chart at your convenience.  Next Medicare Annual Wellness Visit scheduled for next year: Yes

## 2023-09-18 NOTE — Progress Notes (Signed)
Subjective:   Denise Payne is a 76 y.o. female who presents for Medicare Annual (Subsequent) preventive examination.  Visit Complete: Virtual I connected with  Guillermo Bang on 09/18/23 by a audio enabled telemedicine application and verified that I am speaking with the correct person using two identifiers.  Patient Location: Home  Provider Location: Home Office  I discussed the limitations of evaluation and management by telemedicine. The patient expressed understanding and agreed to proceed.  Vital Signs: Because this visit was a virtual/telehealth visit, some criteria may be missing or patient reported. Any vitals not documented were not able to be obtained and vitals that have been documented are patient reported.   Cardiac Risk Factors include: advanced age (>11men, >68 women);hypertension;dyslipidemia     Objective:    Today's Vitals   09/18/23 1132  Weight: 131 lb (59.4 kg)   Body mass index is 23.96 kg/m.     09/18/2023   11:38 AM 09/14/2022    1:13 PM 05/17/2022    6:53 AM 01/25/2022   11:19 PM 01/25/2022    5:52 PM 09/01/2021    1:16 PM 01/29/2021    6:40 PM  Advanced Directives  Does Patient Have a Medical Advance Directive? Yes Yes Yes No No Yes No;Yes  Type of Estate agent of [place];Living will Healthcare Power of [place];Living will Healthcare Power of [place];Living will   Healthcare Power of [employer] of [place];Living will  Does patient want to make changes to medical advance directive?   No - Patient declined      Copy of Healthcare Power of Attorney in Chart? No - copy requested No - copy requested No - copy requested   No - copy requested   Would patient like information on creating a medical advance directive?    No - Patient declined       Current Medications (verified) Outpatient Encounter Medications as of 09/18/2023  Medication Sig   acetaminophen (TYLENOL) 500 MG tablet Take 1,000 mg by mouth every 6  (six) hours as needed for mild pain.   apixaban (ELIQUIS) 5 MG TABS tablet TAKE 1 TABLET TWICE A DAY   Biotin 95621 MCG TABS Take 10,000 mcg by mouth daily.   Cholecalciferol (VITAMIN D) 50 MCG (2000 UT) tablet Take 2,000 Units by mouth daily.   cholestyramine (QUESTRAN) 4 g packet Take 1 packet (4 g total) by mouth 3 (three) times daily with meals. (Patient taking differently: Take 4 g by mouth 3 (three) times daily as needed (diarrhea).)   cyclobenzaprine (FLEXERIL) 10 MG tablet Take 1 tablet (10 mg total) by mouth 3 (three) times daily as needed for muscle spasms.   diclofenac sodium (VOLTAREN) 1 % GEL Apply 2 g topically 2 (two) times daily as needed (pain).    diltiazem (CARDIZEM CD) 180 MG 24 hr capsule Take 1 capsule (180 mg total) by mouth daily as needed.   esomeprazole (NEXIUM) 40 MG capsule TAKE 1 CAPSULE DAILY   hydroxychloroquine (PLAQUENIL) 200 MG tablet Take 200 mg by mouth daily.   metoprolol tartrate (LOPRESSOR) 50 MG tablet Take 1 tablet (50 mg total) by mouth 2 (two) times daily.   montelukast (SINGULAIR) 10 MG tablet TAKE 1 TABLET AT BEDTIME   OVER THE COUNTER MEDICATION Place 1 Application into both eyes at bedtime. Optase Hylo night eye ointment   Probiotic Product (PROBIOTIC PO) Take 1 tablet by mouth daily.   rosuvastatin (CRESTOR) 10 MG tablet TAKE 1 TABLET DAILY   Semaglutide-Weight Management (WEGOVY)  1 MG/0.5ML SOAJ Inject 1 mg into the skin once a week.   vitamin B-12 (CYANOCOBALAMIN) 1000 MCG tablet Take 1,000 mcg by mouth daily.    [DISCONTINUED] Melatonin 10 MG TABS Take 10 mg by mouth at bedtime as needed (sleep).    [DISCONTINUED] METAMUCIL FIBER PO Take 1 Scoop by mouth daily.   No facility-administered encounter medications on file as of 09/18/2023.    Allergies (verified) Latex, Morphine, Meperidine hcl, and Onion   History: Past Medical History:  Diagnosis Date   Anemia    Asthma    Atrial fibrillation (HCC)    Cataract    Gallstones    GERD  (gastroesophageal reflux disease)    High blood pressure    Osteoporosis    Sjogren's syndrome (HCC) 01/26/2019   Past Surgical History:  Procedure Laterality Date   A-FLUTTER ABLATION N/A 05/17/2022   Procedure: A-FLUTTER ABLATION;  Surgeon: Marinus Maw, MD;  [facility];  Service: Cardiovascular;  Laterality: N/A;   ABDOMINAL HYSTERECTOMY  1996   ANKLE FRACTURE SURGERY  2005   AUGMENTATION MAMMAPLASTY Bilateral 10/16/1976   CHOLECYSTECTOMY N/A 12/07/2020   Procedure: LAPAROSCOPIC CHOLECYSTECTOMY WITH INTRAOPERATIVE CHOLANGIOGRAM;  Surgeon: Sheliah Hatch De Blanch, MD;  Location: [facility];  Service: General;  Laterality: N/A;   CYSTOSCOPY WITH STENT PLACEMENT N/A 05/22/2019   Procedure: CYSTOSCOPY WITH STENT PLACEMENT;  Surgeon: Bjorn Pippin, MD;  Location: [facility];  Service: Urology;  Laterality: N/A;   FLEXIBLE SIGMOIDOSCOPY N/A 05/22/2019   Procedure: FLEXIBLE SIGMOIDOSCOPY;  Surgeon: Andria Meuse, MD;  Location: [facility];  Service: General;  Laterality: N/A;   XI ROBOTIC ASSISTED LOWER ANTERIOR RESECTION Bilateral 05/22/2019   Procedure: XI ROBOTIC ASSISTED SIGMOIDECTOMY;  Surgeon: Andria Meuse, MD;  Location: [facility];  Service: General;  Laterality: Bilateral;   Family History  Problem Relation Age of Onset   Breast cancer Mother 33   Hearing loss Mother    Heart disease Mother    High Cholesterol Mother    Hypertension Mother    Cancer Father    Hearing loss Father    Stroke Father    Transient ischemic attack Father    Arthritis Maternal Grandmother    Cancer Maternal Grandmother    Early death Maternal Grandmother    Diabetes Maternal Grandfather    Early death Maternal Grandfather    Hearing loss Maternal Grandfather    Heart attack Maternal Grandfather    Heart disease Maternal Grandfather    Arthritis Paternal Grandmother    Early death Paternal Grandmother    Hearing loss Paternal Grandmother    Hyperlipidemia Paternal Grandmother    Early  death Paternal Grandfather    Kidney disease Paternal Grandfather    Cancer Sister    Hyperlipidemia Sister    Hypertension Sister    Birth defects Brother    Cancer Brother    Hyperlipidemia Brother    Social History   Socioeconomic History   Marital status: Widowed    Spouse name: Not on file   Number of children: Not on file   Years of education: Not on file   Highest education level: Not on file  Occupational History   Occupation: retired  Tobacco Use   Smoking status: Never   Smokeless tobacco: Never  Vaping Use   Vaping status: Never Used  Substance and Sexual Activity   Alcohol use: Yes    Alcohol/week: 8.0 standard drinks of alcohol    Types: 8 Glasses of wine per week  Comment: 3 times daily   Drug use: Never   Sexual activity: Not Currently  Other Topics Concern   Not on file  Social History Narrative   Not on file   Social Determinants of Health   Financial Resource Strain: Low Risk  (09/18/2023)   Overall Financial Resource Strain (CARDIA)    Difficulty of Paying Living Expenses: Not hard at all  Food Insecurity: No Food Insecurity (09/18/2023)   Hunger Vital Sign    Worried About Running Out of Food in the Last Year: Never true    Ran Out of Food in the Last Year: Never true  Transportation Needs: No Transportation Needs (09/18/2023)   PRAPARE - Administrator, Civil Service (Medical): No    Lack of Transportation (Non-Medical): No  Physical Activity: Inactive (09/18/2023)   Exercise Vital Sign    Days of Exercise per Week: 0 days    Minutes of Exercise per Session: 0 min  Stress: No Stress Concern Present (09/18/2023)   [employer] of Occupational Health - Occupational Stress Questionnaire    Feeling of Stress : Not at all  Social Connections: Moderately Isolated (09/18/2023)   Social Connection and Isolation Panel [NHANES]    Frequency of Communication with Friends and Family: More than three times a week    Frequency of Social  Gatherings with Friends and Family: More than three times a week    Attends Religious Services: More than 4 times per year    Active Member of [employer] or Organizations: No    Attends Banker Meetings: Never    Marital Status: Widowed    Tobacco Counseling Counseling given: Not Answered   Clinical Intake:  Pre-visit preparation completed: Yes  Pain : No/denies pain     BMI - recorded: 23.96 Nutritional Status: BMI of 19-24  Normal Diabetes: No  How often do you need to have someone help you when you read instructions, pamphlets, or other written materials from your doctor or pharmacy?: 1 - Never  Interpreter Needed?: No  Information entered by :: Lanier Ensign, LPN   Activities of Daily Living    09/18/2023   11:34 AM  In your present state of health, do you have any difficulty performing the following activities:  Hearing? 1  Comment hearing aids  Vision? 0  Difficulty concentrating or making decisions? 0  Walking or climbing stairs? 0  Dressing or bathing? 0  Doing errands, shopping? 0  Preparing Food and eating ? N  Using the Toilet? N  In the past six months, have you accidently leaked urine? N  Do you have problems with loss of bowel control? N  Managing your Medications? N  Managing your Finances? N  Housekeeping or managing your Housekeeping? N    Patient Care Team: Ardith Dark, MD as PCP - General (Family Medicine) Croitoru, Rachelle Hora, MD as PCP - Cardiology (Cardiology)  Indicate any recent Medical Services you may have received from other than [facility] providers in the past year (date may be approximate).     Assessment:   This is a routine wellness examination for Denise Payne.  Hearing/Vision screen Hearing Screening - Comments:: Pt has hearing aids  Vision Screening - Comments:: Pt follows up with [facility] for annual eye exams    Goals Addressed   None    Depression Screen    09/18/2023   11:37 AM 06/29/2023   10:23 AM 03/15/2023     2:21 PM 12/25/2022    1:28 PM  09/14/2022    1:12 PM 06/26/2022    1:31 PM 02/03/2022    9:22 AM  PHQ 2/9 Scores  PHQ - 2 Score 0 0 0 0 0 0 0    Fall Risk    09/18/2023   11:39 AM 06/29/2023   10:23 AM 03/15/2023    2:21 PM 12/25/2022    1:28 PM 09/14/2022    1:13 PM  Fall Risk   Falls in the past year? 0 0 0 0 1  Number falls in past yr: 0 0 0 0 1  Injury with Fall? 0 0 0 0 1  Comment     fracture right wrist  Risk for fall due to : No Fall Risks No Fall Risks No Fall Risks No Fall Risks Impaired vision  Follow up Falls prevention discussed    Falls prevention discussed    MEDICARE RISK AT HOME: Medicare Risk at Home Any stairs in or around the home?: Yes If so, are there any without handrails?: No Home free of loose throw rugs in walkways, pet beds, electrical cords, etc?: Yes Adequate lighting in your home to reduce risk of falls?: Yes Life alert?: No Use of a cane, walker or w/c?: No Grab bars in the bathroom?: Yes Shower chair or bench in shower?: No Elevated toilet seat or a handicapped toilet?: No  TIMED UP AND GO:  Was the test performed?  No    Cognitive Function:        09/18/2023   11:40 AM 09/14/2022    1:15 PM 09/01/2021    1:20 PM 08/26/2020    1:20 PM  6CIT Screen  What Year? 0 points 0 points 0 points 0 points  What month? 0 points 0 points 0 points 0 points  What time? 0 points 0 points 0 points   Count back from 20 0 points 0 points 0 points 0 points  Months in reverse 0 points 0 points 0 points 0 points  Repeat phrase 0 points 0 points 0 points 0 points  Total Score 0 points 0 points 0 points     Immunizations Immunization History  Administered Date(s) Administered   Fluad Quad(high Dose 65+) 06/27/2021, 06/26/2022   Fluad Trivalent(High Dose 65+) 06/29/2023   Hep A / Hep B 02/13/2022, 03/14/2022   Influenza, High Dose Seasonal PF 01/26/2019   Influenza-Unspecified 08/04/2020   PFIZER(Purple Top)SARS-COV-2 Vaccination 11/05/2019,  11/26/2019, 05/29/2020, 01/13/2021, 09/14/2021   Pneumococcal Conjugate-13 10/28/2015   Pneumococcal Polysaccharide-23 12/27/2021   Tdap 04/25/2016   Typhoid Inactivated 04/24/2022   Yellow Fever 04/24/2022   Zoster Recombinant(Shingrix) 02/04/2022, 04/01/2022    TDAP status: Up to date  Flu Vaccine status: Up to date  Pneumococcal vaccine status: Up to date  Covid-19 vaccine status: Information provided on how to obtain vaccines.   Qualifies for Shingles Vaccine? Yes   Zostavax completed Yes   Shingrix Completed?: Yes  Screening Tests Health Maintenance  Topic Date Due   COVID-19 Vaccine (6 - 2023-24 season) 06/17/2023   Medicare Annual Wellness (AWV)  09/17/2024   DTaP/Tdap/Td (2 - Td or Tdap) 04/25/2026   Pneumonia Vaccine 59+ Years old  Completed   INFLUENZA VACCINE  Completed   DEXA SCAN  Completed   Hepatitis C Screening  Completed   Zoster Vaccines- Shingrix  Completed   HPV VACCINES  Aged Out   Colonoscopy  Discontinued    Health Maintenance  Health Maintenance Due  Topic Date Due   COVID-19 Vaccine (6 - 2023-24 season) 06/17/2023  Colorectal cancer screening: No longer required.   Mammogram status: Ordered Scheduled 09/27/23. Pt provided with contact info and advised to call to schedule appt.   Bone Density status: Completed 07/31/23. Results reflect: Bone density results: OSTEOPENIA. Repeat every 2 years.   Additional Screening:  Hepatitis C Screening: Completed 12/15/20  Vision Screening: Recommended annual ophthalmology exams for early detection of glaucoma and other disorders of the eye. Is the patient up to date with their annual eye exam?  Yes  Who is the provider or what is the name of the office in which the patient attends annual eye exams? [facility]  If pt is not established with a provider, would they like to be referred to a provider to establish care? No .   Dental Screening: Recommended annual dental exams for proper oral  hygiene   Community Resource Referral / Chronic Care Management: CRR required this visit?  No   CCM required this visit?  No     Plan:     I have personally reviewed and noted the following in the patient's chart:   Medical and social history Use of alcohol, tobacco or illicit drugs  Current medications and supplements including opioid prescriptions. Patient is not currently taking opioid prescriptions. Functional ability and status Nutritional status Physical activity Advanced directives List of other physicians Hospitalizations, surgeries, and ER visits in previous 12 months Vitals Screenings to include cognitive, depression, and falls Referrals and appointments  In addition, I have reviewed and discussed with patient certain preventive protocols, quality metrics, and best practice recommendations. A written personalized care plan for preventive services as well as general preventive health recommendations were provided to patient.     Marzella Schlein, LPN   25/12/6642   After Visit Summary: (MyChart) Due to this being a telephonic visit, the after visit summary with patients personalized plan was offered to patient via MyChart   Nurse Notes: none

## 2023-09-22 NOTE — Progress Notes (Unsigned)
Cardiology Office Note   Date:  09/22/2023   ID:  [name], [name] 24-Nov-1945, MRN [id number]  PCP:  Ardith Dark, MD  Cardiologist:   Dietrich Pates, MD  Pt presents for follow up of atrial fibrillation       History of Present Illness: Denise Payne is a 77 y.o. female with a history of atrial flutter  (s/p ablation) and PAF  She has been seen by Rosette Reveal in clinic, last in Dec 2023  The pt says she has had some spells of atrial fibrillation   She also had a spell that woke her up from sleep one night at 3 am    Fluttering lasted until 6 PM that evening    Felt very SOB     When not in afib she feels OK   No CP Breathing is better     I saw the pt in June 2024   No outpatient medications have been marked as taking for the 09/25/23 encounter (Appointment) with Pricilla Riffle, MD.     Allergies:   Latex, Morphine, Meperidine hcl, and Onion   Past Medical History:  Diagnosis Date   Anemia    Asthma    Atrial fibrillation (HCC)    Cataract    Gallstones    GERD (gastroesophageal reflux disease)    High blood pressure    Osteoporosis    Sjogren's syndrome (HCC) 01/26/2019    Past Surgical History:  Procedure Laterality Date   A-FLUTTER ABLATION N/A 05/17/2022   Procedure: A-FLUTTER ABLATION;  Surgeon: Marinus Maw, MD;  [facility];  Service: Cardiovascular;  Laterality: N/A;   ABDOMINAL HYSTERECTOMY  1996   ANKLE FRACTURE SURGERY  2005   AUGMENTATION MAMMAPLASTY Bilateral 10/16/1976   CHOLECYSTECTOMY N/A 12/07/2020   Procedure: LAPAROSCOPIC CHOLECYSTECTOMY WITH INTRAOPERATIVE CHOLANGIOGRAM;  Surgeon: Sheliah Hatch De Blanch, MD;  Location: [facility];  Service: General;  Laterality: N/A;   CYSTOSCOPY WITH STENT PLACEMENT N/A 05/22/2019   Procedure: CYSTOSCOPY WITH STENT PLACEMENT;  Surgeon: Bjorn Pippin, MD;  [facility];  Service: Urology;  Laterality: N/A;   FLEXIBLE SIGMOIDOSCOPY N/A 05/22/2019   Procedure: FLEXIBLE SIGMOIDOSCOPY;  Surgeon:  Andria Meuse, MD;  Location: [facility];  Service: General;  Laterality: N/A;   XI ROBOTIC ASSISTED LOWER ANTERIOR RESECTION Bilateral 05/22/2019   Procedure: XI ROBOTIC ASSISTED SIGMOIDECTOMY;  Surgeon: Andria Meuse, MD;  Location: [facility];  Service: General;  Laterality: Bilateral;     Social History:  The patient  reports that she has never smoked. She has never used smokeless tobacco. She reports current alcohol use of about 8.0 standard drinks of alcohol per week. She reports that she does not use drugs.   Family History:  The patient's family history includes Arthritis in her maternal grandmother and paternal grandmother; Birth defects in her brother; Breast cancer (age of onset: 49) in her mother; Cancer in her brother, father, maternal grandmother, and sister; Diabetes in her maternal grandfather; Early death in her maternal grandfather, maternal grandmother, paternal grandfather, and paternal grandmother; Hearing loss in her father, maternal grandfather, mother, and paternal grandmother; Heart attack in her maternal grandfather; Heart disease in her maternal grandfather and mother; High Cholesterol in her mother; Hyperlipidemia in her brother, paternal grandmother, and sister; Hypertension in her mother and sister; Kidney disease in her paternal grandfather; Stroke in her father; Transient ischemic attack in her father.    ROS:  Please see the history of present illness. All other  systems are reviewed and  Negative to the above problem except as noted.    PHYSICAL EXAM: VS:  There were no vitals taken for this visit.  GEN: Well nourished, well developed, in no acute distress  HEENT: normal  Neck: no JVD, carotid bruits Cardiac: RRR; no murmurs  No LE edema  Respiratory:  clear to auscultation bilaterally,   EKG:  EKG is not ordered today.   Lipid Panel    Component Value Date/Time   CHOL 150 12/27/2021 1212   TRIG 113.0 12/27/2021 1212   HDL 72.90 12/27/2021 1212    CHOLHDL 2 12/27/2021 1212   VLDL 22.6 12/27/2021 1212   LDLCALC 54 12/27/2021 1212      Wt Readings from Last 3 Encounters:  09/18/23 131 lb (59.4 kg)  07/23/23 131 lb (59.4 kg)  07/12/23 137 lb (62.1 kg)      ASSESSMENT AND PLAN:  1  Rhythm   Pt with intermitt episodes of afib   Very symptomatic   I would recomm increasing metoprolol to 50 bid    Will review with EP  2   Atrial flutter s/p ablation    2  Dyspnea.   Pt with severe dyspnea with afib    ? If more than just rhythm   Will set up for cardiac CTA    3  HTN  BP is a little high   Can be higher at home   INcrease metoprolol to 50 bid   Follow    Current medicines are reviewed at length with the patient today.  The patient does not have concerns regarding medicines.  Signed, Dietrich Pates, MD  09/22/2023 10:58 PM    [facility] [address] [place], [place], [place]  [address] Phone: [phone]; Fax: [phone]

## 2023-09-25 ENCOUNTER — Ambulatory Visit: Payer: Medicare Other | Attending: Internal Medicine | Admitting: Internal Medicine

## 2023-09-25 ENCOUNTER — Encounter: Payer: Self-pay | Admitting: Internal Medicine

## 2023-09-25 VITALS — BP 124/62 | HR 57 | Ht 62.0 in | Wt 122.3 lb

## 2023-09-25 DIAGNOSIS — E785 Hyperlipidemia, unspecified: Secondary | ICD-10-CM | POA: Diagnosis not present

## 2023-09-25 DIAGNOSIS — Z09 Encounter for follow-up examination after completed treatment for conditions other than malignant neoplasm: Secondary | ICD-10-CM | POA: Insufficient documentation

## 2023-09-25 DIAGNOSIS — Z79899 Other long term (current) drug therapy: Secondary | ICD-10-CM | POA: Insufficient documentation

## 2023-09-25 NOTE — Patient Instructions (Addendum)
Medication Instructions:   *If you need a refill on your cardiac medications before your next appointment, please call your pharmacy*   Lab Work: Nmr, apo b, lipo a  If you have labs (blood work) drawn today and your tests are completely normal, you will receive your results only by: MyChart Message (if you have MyChart) OR A paper copy in the mail If you have any lab test that is abnormal or we need to change your treatment, we will call you to review the results.   Testing/Procedures:    Follow-Up: At [facility], you and your health needs are our priority.  As part of our continuing mission to provide you with exceptional heart care, we have created designated Provider Care Teams.  These Care Teams include your primary Cardiologist (physician) and Advanced Practice Providers (APPs -  Physician Assistants and Nurse Practitioners) who all work together to provide you with the care you need, when you need it.  We recommend signing up for the patient portal called "MyChart".  Sign up information is provided on this After Visit Summary.  MyChart is used to connect with patients for Virtual Visits (Telemedicine).  Patients are able to view lab/test results, encounter notes, upcoming appointments, etc.  Non-urgent messages can be sent to your provider as well.   To learn more about what you can do with MyChart, go to [email or web address].    Your next appointment:  9 months

## 2023-09-27 ENCOUNTER — Ambulatory Visit
Admission: RE | Admit: 2023-09-27 | Discharge: 2023-09-27 | Disposition: A | Discharge status: Home / Self Care | Payer: Medicare Other | Source: Ambulatory Visit | Attending: Family Medicine | Admitting: Family Medicine

## 2023-09-27 ENCOUNTER — Other Ambulatory Visit: Payer: Self-pay | Admitting: Family Medicine

## 2023-09-27 DIAGNOSIS — N6320 Unspecified lump in the left breast, unspecified quadrant: Secondary | ICD-10-CM

## 2023-09-27 DIAGNOSIS — T8549XA Other mechanical complication of breast prosthesis and implant, initial encounter: Secondary | ICD-10-CM | POA: Diagnosis not present

## 2023-09-27 LAB — NMR, LIPOPROFILE
Cholesterol, Total: 133 mg/dL (ref 100–199)
HDL Particle Number: 35.3 umol/L (ref >=30.5)
HDL-C: 64 mg/dL (ref >39)
LDL Particle Number: 457 nmol/L (ref <1000)
LDL Size: 20.5 nmol — ABNORMAL LOW (ref >20.5)
LDL-C (NIH Calc): 54 mg/dL (ref 0–99)
LP-IR Score: 32 (ref <=45)
Small LDL Particle Number: 253 nmol/L (ref <=527)
Triglycerides: 78 mg/dL (ref 0–149)

## 2023-09-27 LAB — APOLIPOPROTEIN B: Apolipoprotein B: 51 mg/dL (ref <90)

## 2023-09-27 LAB — LIPOPROTEIN A (LPA): Lipoprotein (a): 22.3 nmol/L (ref <75.0)

## 2023-09-27 NOTE — Progress Notes (Signed)
Her mammogram shows ruptured implant.  Recommend referral to plastic surgery for further management.  Place referral if she is agreeable.

## 2023-09-28 ENCOUNTER — Ambulatory Visit (INDEPENDENT_AMBULATORY_CARE_PROVIDER_SITE_OTHER): Payer: Medicare Other | Admitting: Family Medicine

## 2023-09-28 ENCOUNTER — Encounter: Payer: Self-pay | Admitting: Family Medicine

## 2023-09-28 VITALS — BP 120/73 | HR 71 | Temp 98.0°F | Ht 62.0 in | Wt 121.2 lb

## 2023-09-28 DIAGNOSIS — E663 Overweight: Secondary | ICD-10-CM | POA: Diagnosis not present

## 2023-09-28 DIAGNOSIS — T8543XA Leakage of breast prosthesis and implant, initial encounter: Secondary | ICD-10-CM | POA: Diagnosis not present

## 2023-09-28 DIAGNOSIS — M8580 Other specified disorders of bone density and structure, unspecified site: Secondary | ICD-10-CM

## 2023-09-28 DIAGNOSIS — I10 Essential (primary) hypertension: Secondary | ICD-10-CM

## 2023-09-28 NOTE — Assessment & Plan Note (Signed)
Blood pressure at goal today on metoprolol tartrate 50 mg twice daily and diltiazem 180 mg daily.

## 2023-09-28 NOTE — Assessment & Plan Note (Signed)
Along with orthopedics.  They are working on getting her on [employer]

## 2023-09-28 NOTE — Assessment & Plan Note (Signed)
BMI 22.18.  She is down about 15 pounds since her last visit.  She is currently on Wegovy 1 mg every 10 days.  We did discuss switching to 0.5 mg weekly however she would like to continue with current dose for now as she still has several pens at home.  She will follow-up with [place] in a few weeks.  Discussed with patient we should not have her lose too much more weight than where she is now.  If continues have weight loss with current dose we will switch back to 0.5 mg weekly.

## 2023-09-28 NOTE — Progress Notes (Signed)
   Denise Payne is a 77 y.o. female who presents today for an office visit.  Assessment/Plan:  New/Acute Problems: Ruptured Silicone Breast Implant Will refer to plastic surgery.  Chronic Problems Addressed Today: Overweight BMI 22.18.  She is down about 15 pounds since her last visit.  She is currently on Wegovy 1 mg every 10 days.  We did discuss switching to 0.5 mg weekly however she would like to continue with current dose for now as she still has several pens at home.  She will follow-up with [place] in a few weeks.  Discussed with patient we should not have her lose too much more weight than where she is now.  If continues have weight loss with current dose we will switch back to 0.5 mg weekly.  Osteoporosis Along with orthopedics.  They are working on getting her on Evenity  Benign essential HTN Blood pressure at goal today on metoprolol tartrate 50 mg twice daily and diltiazem 180 mg daily.     Subjective:  HPI:  See assessment / plan for status of chronic conditions.  Patient is here today for follow-up.  Last saw 3 months ago.  At that time she was doing well with [facility] and we increased her dose to 1 mg weekly. She has been spacing out her injection to every 10 days to slow down weight loss as she does not want to lose much more weight.   She is following with orthopedics for osteoporosis. They are working on getting her approved for Evinity and Prolia.   She did see her cardiologist a few days ago and had labs performed.  She was continued on current regimen with metoprolol tartrate 50 mg twice daily, diltiazem 180 mg daily as needed, Crestor 10 mg daily, and Eliquis 5 mg twice daily.  She did have mammogram performed yesterday which showed ruptured implant.  She has noticed a lump to the area but no pain.       Objective:  Physical Exam: BP 120/73 (BP Location: Left Arm, Patient Position: Sitting)   Pulse 71   Temp 98 F (36.7 C) (Temporal)   Ht 5\' 2"  (1.575 m)   Wt  121 lb 4 oz (55 kg)   SpO2 100%   BMI 22.18 kg/m   Wt Readings from Last 3 Encounters:  09/28/23 121 lb 4 oz (55 kg)  09/25/23 122 lb 4.8 oz (55.5 kg)  09/18/23 131 lb (59.4 kg)  Gen: No acute distress, resting comfortably Neuro: Grossly normal, moves all extremities Psych: Normal affect and thought content      Zilla Shartzer M. Jimmey Ralph, MD 09/28/2023 11:33 AM

## 2023-09-28 NOTE — Patient Instructions (Signed)
It was very nice to see you today!  We will continue your current dose of Wegovy for now.  Please send me a message in a few weeks to let us know how you are doing and we can adjust the dose as needed.  I will refer you to see a plastic surgeon.  Will see you back in 6 months.  Please come back sooner if needed.  Return in about 6 months (around 03/28/2024) for Follow Up.   Take care, Dr Jimmey Ralph  PLEASE NOTE:  If you had any lab tests, please let us know if you have not heard back within a few days. You may see your results on mychart before we have a chance to review them but we will give you a call once they are reviewed by [place].   If we ordered any referrals today, please let us know if you have not heard from their office within the next week.   If you had any urgent prescriptions sent in today, please check with the pharmacy within an hour of our visit to make sure the prescription was transmitted appropriately.   Please try these tips to maintain a healthy lifestyle:  Eat at least 3 REAL meals and 1-2 snacks per day.  Aim for no more than 5 hours between eating.  If you eat breakfast, please do so within one hour of getting up.   Each meal should contain half fruits/vegetables, one quarter protein, and one quarter carbs (no bigger than a computer mouse)  Cut down on sweet beverages. This includes juice, soda, and sweet tea.   Drink at least 1 glass of water with each meal and aim for at least 8 glasses per day  Exercise at least 150 minutes every week.

## 2023-10-23 ENCOUNTER — Encounter: Payer: Self-pay | Admitting: Plastic Surgery

## 2023-10-23 ENCOUNTER — Ambulatory Visit (INDEPENDENT_AMBULATORY_CARE_PROVIDER_SITE_OTHER): Payer: Medicare Other | Admitting: Plastic Surgery

## 2023-10-23 VITALS — BP 153/85 | HR 61 | Ht 62.0 in | Wt 121.2 lb

## 2023-10-23 DIAGNOSIS — T8543XA Leakage of breast prosthesis and implant, initial encounter: Secondary | ICD-10-CM | POA: Insufficient documentation

## 2023-10-23 NOTE — Progress Notes (Signed)
 Patient ID: Denise Payne, female    DOB: [birth year], 78 y.o.   MRN: [id number]   Chief Complaint  Patient presents with   Advice Only   Breast Problem    The patient is a very nice 78 year old female here for evaluation of her breast.  She is 5 feet 2 inches tall weighs 121 pounds.  She had implants placed around 1978.  This was done in [place] .  She thinks that they might be silicone.  She is not sure of the size of the implants but she is a C cup.  She is on Eliquis  and Wegovy .  She has a history of atrial fibrillation and her cardiologist is Dr. Okey.  Her past medical history as shown below and her past surgical history includes a cholecystectomy, breast augmentation, ankle surgery, and abdominal hysterectomy.  She had a mammogram in early December and it showed extracapsular silicone implant rupture on the left medial aspect.  She likely has bilateral ruptures due to the length of time that these implants have been in which is over for 40 years.  Her breasts are firm and the implants are very firm with capsular contractures.     Review of Systems  Constitutional: Negative.   Eyes: Negative.   Respiratory:  Negative for chest tightness.   Cardiovascular: Negative.   Gastrointestinal: Negative.   Endocrine: Negative.   Genitourinary: Negative.   Musculoskeletal: Negative.   Skin: Negative.     Past Medical History:  Diagnosis Date   Anemia    Asthma    Atrial fibrillation (HCC)    Cataract    Gallstones    GERD (gastroesophageal reflux disease)    High blood pressure    Osteoporosis    Sjogren's syndrome (HCC) 01/26/2019    Past Surgical History:  Procedure Laterality Date   A-FLUTTER ABLATION N/A 05/17/2022   Procedure: A-FLUTTER ABLATION;  Surgeon: Waddell Danelle ORN, MD;  Location: [facility];  Service: Cardiovascular;  Laterality: N/A;   ABDOMINAL HYSTERECTOMY  1996   ANKLE FRACTURE SURGERY  2005   AUGMENTATION MAMMAPLASTY Bilateral 10/16/1976    CHOLECYSTECTOMY N/A 12/07/2020   Procedure: LAPAROSCOPIC CHOLECYSTECTOMY WITH INTRAOPERATIVE CHOLANGIOGRAM;  Surgeon: Stevie Herlene Righter, MD;  Location: [facility] OR;  Service: General;  Laterality: N/A;   CYSTOSCOPY WITH STENT PLACEMENT N/A 05/22/2019   Procedure: CYSTOSCOPY WITH STENT PLACEMENT;  Surgeon: Watt Rush, MD;  Location: [facility];  Service: Urology;  Laterality: N/A;   FLEXIBLE SIGMOIDOSCOPY N/A 05/22/2019   Procedure: FLEXIBLE SIGMOIDOSCOPY;  Surgeon: Teresa Lonni HERO, MD;  Location: [facility];  Service: General;  Laterality: N/A;   XI ROBOTIC ASSISTED LOWER ANTERIOR RESECTION Bilateral 05/22/2019   Procedure: XI ROBOTIC ASSISTED SIGMOIDECTOMY;  Surgeon: Teresa Lonni HERO, MD;  Location: [facility];  Service: General;  Laterality: Bilateral;      Current Outpatient Medications:    acetaminophen  (TYLENOL ) 500 MG tablet, Take 1,000 mg by mouth every 6 (six) hours as needed for mild pain., Disp: , Rfl:    apixaban  (ELIQUIS ) 5 MG TABS tablet, TAKE 1 TABLET TWICE A DAY, Disp: 180 tablet, Rfl: 2   Biotin  10000 MCG TABS, Take 10,000 mcg by mouth daily., Disp: , Rfl:    Cholecalciferol  (VITAMIN D ) 50 MCG (2000 UT) tablet, Take 2,000 Units by mouth daily., Disp: , Rfl:    cholestyramine  (QUESTRAN ) 4 g packet, Take 1 packet (4 g total) by mouth 3 (three) times daily with meals. (Patient taking differently: Take 4 g by mouth  3 (three) times daily as needed (diarrhea).), Disp: 60 each, Rfl: 12   cyclobenzaprine  (FLEXERIL ) 10 MG tablet, Take 1 tablet (10 mg total) by mouth 3 (three) times daily as needed for muscle spasms., Disp: 90 tablet, Rfl: 0   diclofenac sodium (VOLTAREN) 1 % GEL, Apply 2 g topically 2 (two) times daily as needed (pain). , Disp: , Rfl:    diltiazem  (CARDIZEM  CD) 180 MG 24 hr capsule, Take 1 capsule (180 mg total) by mouth daily as needed., Disp: 90 capsule, Rfl: 3   Docusate Sodium  (COLACE PO), Take by mouth., Disp: , Rfl:    esomeprazole  (NEXIUM ) 40 MG capsule, TAKE 1 CAPSULE DAILY,  Disp: 90 capsule, Rfl: 3   hydroxychloroquine  (PLAQUENIL ) 200 MG tablet, Take 200 mg by mouth daily., Disp: , Rfl:    metoprolol  tartrate (LOPRESSOR ) 50 MG tablet, Take 1 tablet (50 mg total) by mouth 2 (two) times daily., Disp: 180 tablet, Rfl: 3   montelukast  (SINGULAIR ) 10 MG tablet, TAKE 1 TABLET AT BEDTIME, Disp: 90 tablet, Rfl: 3   OVER THE COUNTER MEDICATION, Place 1 Application into both eyes at bedtime. Optase Hylo night eye ointment, Disp: , Rfl:    Probiotic Product (PROBIOTIC PO), Take 1 tablet by mouth daily., Disp: , Rfl:    rosuvastatin  (CRESTOR ) 10 MG tablet, TAKE 1 TABLET DAILY, Disp: 90 tablet, Rfl: 3   Semaglutide -Weight Management (WEGOVY ) 1 MG/0.5ML SOAJ, Inject 1 mg into the skin once a week., Disp: 3 mL, Rfl: 1   vitamin B-12 (CYANOCOBALAMIN ) 1000 MCG tablet, Take 1,000 mcg by mouth daily. , Disp: , Rfl:    Objective:   Vitals:   10/23/23 1310  BP: (!) 153/85  Pulse: 61  SpO2: 99%    Physical Exam Vitals and nursing note reviewed.  Constitutional:      Appearance: Normal appearance.  HENT:     Head: Atraumatic.  Cardiovascular:     Rate and Rhythm: Normal rate.     Pulses: Normal pulses.  Pulmonary:     Effort: Pulmonary effort is normal.  Skin:    General: Skin is warm.     Capillary Refill: Capillary refill takes less than 2 seconds.  Neurological:     Mental Status: She is alert and oriented to person, place, and time.  Psychiatric:        Mood and Affect: Mood normal.        Behavior: Behavior normal.        Thought Content: Thought content normal.        Judgment: Judgment normal.     Assessment & Plan:  Rupture of implant of left breast, initial encounter  Recommend removal of bilateral implants without replacement.  Patient is likely in agreement.  She would like to know about mastopexy and I can provide her with a quote for that as well.  Pictures were obtained of the patient and placed in the chart with the patient's or guardian's  permission.   Estefana RAMAN Jadin Kagel, DO

## 2023-10-24 ENCOUNTER — Telehealth: Payer: Self-pay

## 2023-10-24 NOTE — Telephone Encounter (Signed)
 Dayna Dunn, PA-C from patient's cardiology office faxed surgical clearance back advising that their office would check back with pt by virtual visit to determine if Eliquis  can be held when patient advises them if surgery was approved. Per note, pt conveyed understanding. Will update surgical clearance spreadsheet and inform provider.

## 2023-10-24 NOTE — Telephone Encounter (Signed)
   Pre-operative Risk Assessment    Patient Name: Denise Payne  DOB: 03-Mar-1946 MRN: [id number]   Date of last office visit: 09/25/2023 Date of next office visit: Not scheduled   Request for Surgical Clearance    Procedure:   Removal of BL Implants  Date of Surgery:  Clearance TBD                                Surgeon:  Dr. Estefana Fritter Surgeon's Group or Practice Name: [facility] Phone number:  [phone] Fax number:  [phone]   Type of Clearance Requested:   - Medical  - Pharmacy:  Hold Apixaban  (Eliquis )     Type of Anesthesia:  General    Additional requests/questions:    Bonney Ival LOISE Gerome   10/24/2023, 7:51 AM

## 2023-10-24 NOTE — Telephone Encounter (Signed)
   Patient Name: Denise Payne  DOB: 1945/11/29 MRN: [id number]  Primary Cardiologist: Dr. Okey  Chart reviewed as part of pre-operative protocol coverage. Helping in preop today. Patient was seen less than a month ago doing well so I called her to ensure no changes from last OV. She reports that at this time she is unsure if surgery will even take place because she is waiting on approval from her insurance, and if it does get scheduled, it will be April or later. I anticipate she would benefit from a virtual visit in that case closer to time of surgery. Therefore, for now, per our discussion, we will table her clearance request and she will call our office as soon as she knows whether or not surgery will occur. At that time we can loop in our pharm team for anticoag recs and plan a virtual visit. Patient was appreciative of call.  Will route this update to requesting provider via Epic fax function. Please call with questions.  Branston Halsted N Armel Rabbani, PA-C 10/24/2023, 11:33 AM

## 2023-10-29 ENCOUNTER — Other Ambulatory Visit: Payer: Self-pay | Admitting: Internal Medicine

## 2023-10-29 DIAGNOSIS — I4891 Unspecified atrial fibrillation: Secondary | ICD-10-CM

## 2023-10-29 NOTE — Telephone Encounter (Signed)
 Prescription refill request for Eliquis received. Indication:afluter Last office visit:12/24 Scr:0.69  6/24 Age: 78 Weight:55  kg  Prescription refilled

## 2023-11-01 ENCOUNTER — Encounter: Payer: Self-pay | Admitting: Family Medicine

## 2023-11-02 NOTE — Telephone Encounter (Signed)
I think backing off is a good idea. Ok to send in prescription for 0.5 mg weekly.  Katina Degree. Jimmey Ralph, MD 11/02/2023 1:54 PM

## 2023-11-05 ENCOUNTER — Other Ambulatory Visit: Payer: Self-pay | Admitting: *Deleted

## 2023-11-05 MED ORDER — WEGOVY 0.5 MG/0.5ML ~~LOC~~ SOAJ: 0.5000 mg | SUBCUTANEOUS | 1 refills | Status: DC

## 2023-11-05 NOTE — Telephone Encounter (Signed)
New prescription was send to pt pharmacy

## 2023-11-07 ENCOUNTER — Encounter: Payer: Self-pay | Admitting: Family Medicine

## 2023-11-08 NOTE — Telephone Encounter (Signed)
Please start a new PA for Rx [facility]  Thanks

## 2023-11-12 ENCOUNTER — Telehealth: Payer: Self-pay

## 2023-11-12 ENCOUNTER — Other Ambulatory Visit (HOSPITAL_COMMUNITY): Payer: Self-pay

## 2023-11-12 NOTE — Telephone Encounter (Signed)
noted

## 2023-11-12 NOTE — Telephone Encounter (Signed)
Pharmacy Patient Advocate Encounter   Received notification from  [employer] Portal that prior authorization for [facility] 0.5mg /0.34ml auto injector is required/requested.   Insurance verification completed.   The patient is insured through [employer] .   Per test claim: PA required and submitted KEY/EOC/Request #: [id number] CANCELLED due to

## 2023-11-12 NOTE — Telephone Encounter (Signed)
Please see patient message regarding her prior auth for [employer] and advise status

## 2023-11-16 ENCOUNTER — Telehealth: Payer: Self-pay

## 2023-11-16 ENCOUNTER — Other Ambulatory Visit: Payer: Self-pay | Admitting: Family Medicine

## 2023-11-16 NOTE — Telephone Encounter (Signed)
Copied from CRM [id number]. Topic: Clinical - Medication Refill >> Nov 16, 2023 12:25 PM Sonny Dandy B wrote: Most Recent Primary Care Visit:  Provider: Ardith Dark  Department: [facility]  Visit Type: OFFICE VISIT  Date: 09/28/2023  Medication: Semaglutide-Weight Management (WEGOVY) 0.5 MG/0.5ML SOAJ  Has the patient contacted their pharmacy? Yes (Agent: If no, request that the patient contact the pharmacy for the refill. If patient does not wish to contact the pharmacy document the reason why and proceed with request.) (Agent: If yes, when and what did the pharmacy advise?)  Is this the correct pharmacy for this prescription? Yes If no, delete pharmacy and type the correct one.  This is the patient's preferred pharmacy:  [facility] - [place], [place] - [address] [place] [address] Phone: [phone] Fax: [phone]     Has the prescription been filled recently? Yes  Is the patient out of the medication? Yes  Has the patient been seen for an appointment in the last year OR does the patient have an upcoming appointment? Yes  Can we respond through MyChart? Yes  Agent: Please be advised that Rx refills may take up to 3 business days. We ask that you follow-up with your pharmacy.

## 2023-11-16 NOTE — Telephone Encounter (Signed)
Copied from CRM [id number]. Topic: Clinical - Prescription Issue >> Nov 16, 2023 12:21 PM Sonny Dandy B wrote: Reason for CRM: pt called states she left several messages for her provider regarding a prior auth for Semaglutide-Weight Management (WEGOVY) 0.5 MG/0.5ML SOAJ. Pt said he has not  responded her message or the Pharmacy's msg[facility]. Pt is currently out of medication. This medication needs a prior auth to [facility] at [phone] or [phone]  Please see note from patient; do we have any update on PA for [facility] for this patient.

## 2023-11-16 NOTE — Telephone Encounter (Signed)
Last Fill: 11/05/23  Last OV: 09/28/23 Next OV: 03/31/24  Routing to provider for review/authorization.

## 2023-11-19 NOTE — Telephone Encounter (Signed)
I sent in her prescription two weeks ago and it went to the prior authorization team. Looks like it was cancelled - can we double check on the status?  Denise Payne. Jimmey Ralph, MD 11/19/2023 12:45 PM

## 2023-11-20 ENCOUNTER — Telehealth: Payer: Self-pay

## 2023-11-20 ENCOUNTER — Other Ambulatory Visit (HOSPITAL_COMMUNITY): Payer: Self-pay

## 2023-11-20 NOTE — Telephone Encounter (Signed)
PA request has been Submitted. New Encounter created for follow up. For additional info see Pharmacy Prior Auth telephone encounter from 11/20/23.

## 2023-11-20 NOTE — Telephone Encounter (Signed)
 Pharmacy Patient Advocate Encounter   Received notification from Pt Calls Messages that prior authorization for Wegovy  0.5MG /0.5ML auto-injectors  is required/requested.   Insurance verification completed.   The patient is insured through [employer] .   Per test claim: PA required; PA submitted to above mentioned insurance via CoverMyMeds Key/confirmation #/EOC  [id number] Status is pending

## 2023-11-21 ENCOUNTER — Other Ambulatory Visit (HOSPITAL_COMMUNITY): Payer: Self-pay

## 2023-11-21 NOTE — Telephone Encounter (Signed)
 Pharmacy Patient Advocate Encounter  Received notification from [facility] that Prior Authorization for Wegovy  0.5MG /0.5ML auto-injectors  has been APPROVED from 10/21/23 to 11/19/24. Ran test claim, Copay is $43. This test claim was processed through [facility]- copay amounts may vary at other pharmacies due to pharmacy/plan contracts, or as the patient moves through the different stages of their insurance plan.   PA #/Case ID/Reference #: [id number]

## 2023-11-22 NOTE — Telephone Encounter (Signed)
 Patient notified Rx Wegovy  approved

## 2023-12-05 NOTE — Telephone Encounter (Signed)
 Noted

## 2023-12-14 ENCOUNTER — Telehealth: Payer: Self-pay

## 2023-12-14 NOTE — Telephone Encounter (Signed)
-----   Message from [place] Persons sent at 12/14/2023 10:18 AM EST ----- Can we schedule patient for a nurse visit when I am in the office. Thanks ----- Message ----- From: Penne Lash, Neysa Bonito, RT Sent: 12/12/2023   1:44 PM EST To: Oliviah Agostini, RMA; [place] Persons, PA  This has been ordered from Physician services. ----- Message ----- From: Henrine Screws, [place] Sent: 12/10/2023   2:49 PM EST To: Rolanda Lundborg, RT  Patient has Medicare and [employer] Buy & Bill Medication is Evenity No PA required

## 2023-12-14 NOTE — Telephone Encounter (Signed)
-----   Message from Rolanda Lundborg sent at 12/13/2023  3:51 PM EST ----- Sheilah Mins is here ----- Message ----- From: Henrine Screws, RMA Sent: 12/10/2023   2:49 PM EST To: Rolanda Lundborg, RT  Patient has Medicare and [employer] Buy & Bill Medication is Evenity No PA required

## 2023-12-14 NOTE — Telephone Encounter (Signed)
 Noted.

## 2023-12-14 NOTE — Telephone Encounter (Signed)
-----   Message from Rolanda Lundborg sent at 12/12/2023  1:43 PM EST ----- This has been ordered from Physician services. ----- Message ----- From: Henrine Screws, RMA Sent: 12/10/2023   2:49 PM EST To: Rolanda Lundborg, RT  Patient has Medicare and [employer] Buy & Bill Medication is Evenity No PA required

## 2023-12-14 NOTE — Telephone Encounter (Signed)
 Patient is scheduled for Evenity Inj.

## 2024-02-01 ENCOUNTER — Telehealth: Payer: Self-pay | Admitting: *Deleted

## 2024-02-01 ENCOUNTER — Encounter: Payer: Self-pay | Admitting: Student

## 2024-02-01 ENCOUNTER — Ambulatory Visit (INDEPENDENT_AMBULATORY_CARE_PROVIDER_SITE_OTHER): Payer: Medicare Other | Admitting: Student

## 2024-02-01 VITALS — BP 172/95 | HR 77 | Ht 62.0 in | Wt 124.6 lb

## 2024-02-01 DIAGNOSIS — T8543XA Leakage of breast prosthesis and implant, initial encounter: Secondary | ICD-10-CM

## 2024-02-01 MED ORDER — OXYCODONE HCL 5 MG PO TABS: 5.0000 mg | ORAL_TABLET | Freq: Four times a day (QID) | ORAL | 0 refills | Status: DC | PRN

## 2024-02-01 MED ORDER — ONDANSETRON HCL 4 MG PO TABS: 4.0000 mg | ORAL_TABLET | Freq: Three times a day (TID) | ORAL | 0 refills | Status: DC | PRN

## 2024-02-01 MED ORDER — CEPHALEXIN 500 MG PO CAPS: 500.0000 mg | ORAL_CAPSULE | Freq: Four times a day (QID) | ORAL | 0 refills | Status: AC

## 2024-02-01 NOTE — Progress Notes (Signed)
 Patient ID: Denise Payne, female    DOB: September 08, 1946, 78 y.o.   MRN: [id number]  Chief Complaint  Patient presents with   Pre-op Exam      ICD-10-CM   1. Rupture of implant of left breast, initial encounter  T85.43XA        History of Present Illness: Denise Payne is a 78 y.o.  female  with a history of breast implants.  She presents for preoperative evaluation for upcoming procedure, removal of bilateral breast implants, scheduled for 02/25/2024 with Dr. Orin Birk.  Patient reports that in the past, she has had issues in the recovery room after surgery.  She reports that she would fall asleep and have issues with her breathing.  She states that this was a long time ago and that she had surgery in August 2020 and April 2022 and did not have these issues and denied any other issues with anesthesia.  Patient denies any personal history of breast cancer, but reports her mother had breast cancer.  She reports that she has atrial fibrillation and currently takes Eliquis .  She states that she follows up with Dr. Avanell Bob for this.  She denies taking any hormone replacement.  She denies any history of miscarriages.  She denies any personal family history of blood clots or clotting diseases.  She denies any recent traumas, surgeries or infections.  She denies any history of stroke or heart attack.  She denies any history of Crohn's disease or ulcerative colitis.  She denies any history of COPD.  She does report she has asthma, but it is very well-controlled.  She denies any history of cancer.  She does report she has varicose veins.  She denies any recent fevers, chills or changes in her health.  Summary of Previous Visit: Patient was seen for consult by Dr. Orin Birk on 10/23/2023.  At this visit, patient reported that she had implants placed around 1978.  She thinks that they might be silicone.  It was noted that patient was on Eliquis  and Wegovy .  Her mammogram back in December showed  extracapsular silicone implant rupture on the left.  Her breasts were firm and the implants are very firm with capsular contractures.  It was recommended that patient have the implants removed without replacement.  Job: Retired  PMH Significant for: Atrial fibrillation, hypertension, GERD, asthma, osteoporosis, Sjogren's syndrome, ruptured breast implant  Patient's blood pressure is elevated in clinic today.  She denies any headaches, changes in her vision, chest pain or shortness of breath.  She states that she just got back from [place] yesterday and is very jet lagged and stressed.  Patient states that she has a blood pressure cuff at home.  Recommended that she check her blood pressure when she goes home.  Discussed with her that if it is still elevated, to contact her primary care provider.  Patient expressed understanding.   Past Medical History: Allergies: Allergies  Allergen Reactions   Latex Shortness Of Breath and Swelling    Lip and eye swelling   Morphine  Diarrhea   Meperidine Hcl Diarrhea and Nausea And Vomiting   Onion Diarrhea and Other (See Comments)    Stomach cramps    Current Medications:  Current Outpatient Medications:    acetaminophen  (TYLENOL ) 500 MG tablet, Take 1,000 mg by mouth every 6 (six) hours as needed for mild pain., Disp: , Rfl:    Biotin  10000 MCG TABS, Take 10,000 mcg by mouth daily., Disp: , Rfl:    Cholecalciferol  (  VITAMIN D ) 50 MCG (2000 UT) tablet, Take 2,000 Units by mouth daily., Disp: , Rfl:    cholestyramine  (QUESTRAN ) 4 g packet, Take 1 packet (4 g total) by mouth 3 (three) times daily with meals. (Patient taking differently: Take 4 g by mouth 3 (three) times daily as needed (diarrhea).), Disp: 60 each, Rfl: 12   cyclobenzaprine  (FLEXERIL ) 10 MG tablet, Take 1 tablet (10 mg total) by mouth 3 (three) times daily as needed for muscle spasms., Disp: 90 tablet, Rfl: 0   diclofenac sodium (VOLTAREN) 1 % GEL, Apply 2 g topically 2 (two) times daily as  needed (pain). , Disp: , Rfl:    diltiazem  (CARDIZEM  CD) 180 MG 24 hr capsule, Take 1 capsule (180 mg total) by mouth daily as needed., Disp: 90 capsule, Rfl: 3   Docusate Sodium  (COLACE PO), Take by mouth., Disp: , Rfl:    ELIQUIS  5 MG TABS tablet, TAKE 1 TABLET TWICE A DAY, Disp: 180 tablet, Rfl: 3   esomeprazole  (NEXIUM ) 40 MG capsule, TAKE 1 CAPSULE DAILY, Disp: 90 capsule, Rfl: 3   hydroxychloroquine  (PLAQUENIL ) 200 MG tablet, Take 200 mg by mouth daily., Disp: , Rfl:    metoprolol  tartrate (LOPRESSOR ) 50 MG tablet, Take 1 tablet (50 mg total) by mouth 2 (two) times daily., Disp: 180 tablet, Rfl: 3   montelukast  (SINGULAIR ) 10 MG tablet, TAKE 1 TABLET AT BEDTIME, Disp: 90 tablet, Rfl: 3   OVER THE COUNTER MEDICATION, Place 1 Application into both eyes at bedtime. Optase Hylo night eye ointment, Disp: , Rfl:    Probiotic Product (PROBIOTIC PO), Take 1 tablet by mouth daily., Disp: , Rfl:    rosuvastatin  (CRESTOR ) 10 MG tablet, TAKE 1 TABLET DAILY, Disp: 90 tablet, Rfl: 3   Semaglutide -Weight Management (WEGOVY ) 0.5 MG/0.5ML SOAJ, Inject 0.5 mg into the skin once a week., Disp: 2 mL, Rfl: 1   vitamin B-12 (CYANOCOBALAMIN ) 1000 MCG tablet, Take 1,000 mcg by mouth daily. , Disp: , Rfl:   Past Medical Problems: Past Medical History:  Diagnosis Date   Anemia    Asthma    Atrial fibrillation (HCC)    Cataract    Gallstones    GERD (gastroesophageal reflux disease)    High blood pressure    Osteoporosis    Sjogren's syndrome (HCC) 01/26/2019    Past Surgical History: Past Surgical History:  Procedure Laterality Date   A-FLUTTER ABLATION N/A 05/17/2022   Procedure: A-FLUTTER ABLATION;  Surgeon: Tammie Fall, MD;  Location: [facility];  Service: Cardiovascular;  Laterality: N/A;   ABDOMINAL HYSTERECTOMY  1996   ANKLE FRACTURE SURGERY  2005   AUGMENTATION MAMMAPLASTY Bilateral 10/16/1976   CHOLECYSTECTOMY N/A 12/07/2020   Procedure: LAPAROSCOPIC CHOLECYSTECTOMY WITH  INTRAOPERATIVE CHOLANGIOGRAM;  Surgeon: Dorrie Gaudier Alphonso Aschoff, MD;  Location: [facility];  Service: General;  Laterality: N/A;   CYSTOSCOPY WITH STENT PLACEMENT N/A 05/22/2019   Procedure: CYSTOSCOPY WITH STENT PLACEMENT;  Surgeon: Homero Luster, MD;  Location: [facility];  Service: Urology;  Laterality: N/A;   FLEXIBLE SIGMOIDOSCOPY N/A 05/22/2019   Procedure: FLEXIBLE SIGMOIDOSCOPY;  Surgeon: Melvenia Stabs, MD;  Location: [facility];  Service: General;  Laterality: N/A;   XI ROBOTIC ASSISTED LOWER ANTERIOR RESECTION Bilateral 05/22/2019   Procedure: XI ROBOTIC ASSISTED SIGMOIDECTOMY;  Surgeon: Melvenia Stabs, MD;  Location: [facility];  Service: General;  Laterality: Bilateral;    Social History: Social History   Socioeconomic History   Marital status: Widowed    Spouse name: Not on file  Number of children: Not on file   Years of education: Not on file   Highest education level: Not on file  Occupational History   Occupation: retired  Tobacco Use   Smoking status: Never   Smokeless tobacco: Never  Vaping Use   Vaping status: Never Used  Substance and Sexual Activity   Alcohol  use: Yes    Alcohol /week: 8.0 standard drinks of alcohol     Types: 8 Glasses of wine per week    Comment: 3 times daily   Drug use: Never   Sexual activity: Not Currently  Other Topics Concern   Not on file  Social History Narrative   Not on file   Social Drivers of Health   Financial Resource Strain: Low Risk  (09/18/2023)   Overall Financial Resource Strain (CARDIA)    Difficulty of Paying Living Expenses: Not hard at all  Food Insecurity: No Food Insecurity (09/18/2023)   Hunger Vital Sign    Worried About Running Out of Food in the Last Year: Never true    Ran Out of Food in the Last Year: Never true  Transportation Needs: No Transportation Needs (09/18/2023)   PRAPARE - Administrator, Civil Service (Medical): No    Lack of Transportation (Non-Medical): No  Physical Activity: Inactive  (09/18/2023)   Exercise Vital Sign    Days of Exercise per Week: 0 days    Minutes of Exercise per Session: 0 min  Stress: No Stress Concern Present (09/18/2023)   [employer] of Occupational Health - Occupational Stress Questionnaire    Feeling of Stress : Not at all  Social Connections: Moderately Isolated (09/18/2023)   Social Connection and Isolation Panel [NHANES]    Frequency of Communication with Friends and Family: More than three times a week    Frequency of Social Gatherings with Friends and Family: More than three times a week    Attends Religious Services: More than 4 times per year    Active Member of [employer] or Organizations: No    Attends Banker Meetings: Never    Marital Status: Widowed  Intimate Partner Violence: Not At Risk (09/18/2023)   Humiliation, Afraid, Rape, and Kick questionnaire    Fear of Current or Ex-Partner: No    Emotionally Abused: No    Physically Abused: No    Sexually Abused: No    Family History: Family History  Problem Relation Age of Onset   Breast cancer Mother 78   Hearing loss Mother    Heart disease Mother    High Cholesterol Mother    Hypertension Mother    Cancer Father    Hearing loss Father    Stroke Father    Transient ischemic attack Father    Arthritis Maternal Grandmother    Cancer Maternal Grandmother    Early death Maternal Grandmother    Diabetes Maternal Grandfather    Early death Maternal Grandfather    Hearing loss Maternal Grandfather    Heart attack Maternal Grandfather    Heart disease Maternal Grandfather    Arthritis Paternal Grandmother    Early death Paternal Grandmother    Hearing loss Paternal Grandmother    Hyperlipidemia Paternal Grandmother    Early death Paternal Grandfather    Kidney disease Paternal Grandfather    Cancer Sister    Hyperlipidemia Sister    Hypertension Sister    Birth defects Brother    Cancer Brother    Hyperlipidemia Brother     Review of Systems: Denies  any recent fevers, chills or changes in her health  Physical Exam: Vital Signs BP (!) 172/95 (BP Location: Left Arm, Patient Position: Sitting, Cuff Size: Normal)   Pulse 77   Ht 5\' 2"  (1.575 m)   Wt 124 lb 9.6 oz (56.5 kg)   SpO2 99%   BMI 22.79 kg/m   Physical Exam  Constitutional:      General: Not in acute distress.    Appearance: Normal appearance. Not ill-appearing.  HENT:     Head: Normocephalic and atraumatic.  Neck:     Musculoskeletal: Normal range of motion.  Cardiovascular:     Rate and Rhythm: Normal rate Pulmonary:     Effort: Pulmonary effort is normal. No respiratory distress.  Musculoskeletal: Normal range of motion.  Skin:    General: Skin is warm and dry.     Findings: No erythema or rash.  Neurological:     Mental Status: Alert and oriented to person, place, and time. Mental status is at baseline.  Psychiatric:        Mood and Affect: Mood normal.        Behavior: Behavior normal.    Assessment/Plan: The patient is scheduled for removal of bilateral breast implants with Dr. Orin Birk.  Risks, benefits, and alternatives of procedure discussed, questions answered and consent obtained.    Will send clearance to patient's cardiology provider to move forward with surgery and for clearance to hold Eliquis  prior to surgery.  Smoking Status: Non-smoker; Counseling Given?  N/A Last Mammogram: 09/27/2023; Results: BI-RADS Category 2 -left breast implant rupture  Caprini Score: 6; Risk Factors include: Age, history of varicosities, and length of planned surgery. Recommendation for mechanical prophylaxis. Encourage early ambulation.   Pictures obtained: @consult   Post-op Rx sent to pharmacy: Oxycodone , Zofran , Keflex  -patient states that she has taken oxycodone  in the past without any difficulties.  Instructed patient to hold multivitamins, vitamins and supplements 1 week prior to surgery.  Instructed her to hold her Wegovy  the dose before surgery.  Will plan  on her holding her Eliquis  2 days prior to surgery.  Patient expressed understanding.  Patient states that she is about to start an injection called Elivity next week for her osteoporosis.  I recommended that she hold off on starting these injections if she can until a month after surgery as injections like these can cause wound healing issues.  I also recommended that patient hold her Plaquenil , but patient states that she cannot due to her Sjogren's.  She states that she has never held it for surgery before.  She states that she continue to take her Plaquenil  throughout the perioperative period for her surgery on her right wrist.  I did discuss with her that her Plaquenil  can increase her risk of wound healing problems.  Patient expressed understanding.  Patient was provided with the General Surgical Risk consent document and Pain Medication Agreement prior to their appointment.  They had adequate time to read through the risk consent documents and Pain Medication Agreement. We also discussed them in person together during this preop appointment. All of their questions were answered to their satisfaction.  Recommended calling if they have any further questions.  Risk consent form and Pain Medication Agreement to be scanned into patient's chart.  The consent was obtained with risks and complications reviewed which included bleeding, pain, scar, infection and the risk of anesthesia.  The patients questions were answered to the patients expressed satisfaction.    Electronically signed by: Harden Leyden,  PA-C 02/01/2024 2:24 PM

## 2024-02-01 NOTE — Telephone Encounter (Signed)
   Pre-operative Risk Assessment    Patient Name: Denise Payne  DOB: 1946-07-17 MRN: [id number]   Date of last office visit: 09/25/23 DR. ROSS Date of next office visit: NONE   Request for Surgical Clearance    Procedure:   REMOVAL OF B/L BREAST IMPLANTS ( SEE ORIGINAL REQUEST 10/24/23); SEE NOTES FROM DAYNA DUNN, PAC  Date of Surgery:  Clearance 02/25/24                                Surgeon:  DR. Gilles Lacks Surgeon's Group or Practice Name:  [facility] Phone number:  [phone] Fax number:  [phone]   Type of Clearance Requested:   - Medical  - Pharmacy:  Hold Apixaban  (Eliquis ) x 2 DAYS PRIOR   Type of Anesthesia:  General    Additional requests/questions:    Princ[place] Broom   02/01/2024, 2:43 PM

## 2024-02-01 NOTE — Telephone Encounter (Signed)
 Pharmacy please advise on holding Eliquis  prior to bilateral removal of breast implant scheduled for 02/25/2024. Thank you.

## 2024-02-04 ENCOUNTER — Ambulatory Visit: Payer: Medicare Other | Admitting: Physician Assistant

## 2024-02-04 DIAGNOSIS — M810 Age-related osteoporosis without current pathological fracture: Secondary | ICD-10-CM

## 2024-02-04 MED ORDER — ROMOSOZUMAB-AQQG 105 MG/1.17ML ~~LOC~~ SOSY: 210.0000 mg | PREFILLED_SYRINGE | SUBCUTANEOUS | Status: AC

## 2024-02-04 NOTE — Progress Notes (Signed)
 Patient came in today and received her first Evenity  injections  she tolerated the injection well and will be back in 31 days for her second injection Patient was injected in each arm

## 2024-02-04 NOTE — Telephone Encounter (Signed)
 Surgical PA had routed Dr. Charlott Converse preop request, forwarded to preop team. In meantime Dr. Avanell Bob responded: "From: Elmyra Haggard, MD   Sent: 02/02/2024   5:59 PM EDT  To: Alexandria Angel, PA-C; Harden Leyden, PA-C  Subject: RE: [facility] - Upcoming Surgery          Yes, pt is ok to stop Eliquis  2 days before surgery   No hx of CVA in past  Coronary CT showed mild CAD   She was asymptomatic when I saw her     From cardiac standpoint I think she is at low risk for a major cardiac event and OK  To proceed with surgery   Ola Berger "  Will route to preop team to finalize

## 2024-02-05 ENCOUNTER — Telehealth: Payer: Self-pay | Admitting: *Deleted

## 2024-02-05 NOTE — Telephone Encounter (Signed)
   Patient Name: Konni Pehrson  DOB: Jun 07, 1946 MRN: [id number]  Primary Cardiologist: Luana Rumple, MD  Chart reviewed as part of pre-operative protocol coverage. Given past medical history and time since last visit, based on ACC/AHA guidelines, Harumi Kemple is at acceptable risk for the planned procedure without further cardiovascular testing.   Per Dr. Avanell Bob, "Yes, pt is ok to stop Eliquis  2 days before surgery   No hx of CVA in past  Coronary CT showed mild CAD   She was asymptomatic when I saw her     From cardiac standpoint I think she is at low risk for a major cardiac event and OK  To proceed with surgery   Ola Berger "   Please resume Eliquis  as soon as possible postprocedure, at the discretion of the surgeon.   I will route this recommendation to the requesting party via Epic fax function and remove from pre-op pool.  Please call with questions.  Jude Norton, NP 02/05/2024, 8:20 AM

## 2024-02-05 NOTE — Telephone Encounter (Signed)
 Faxed request for Surgical Clearance to [facility],MD-(Cardiology).  Confirmation received and copy scanned into the chart.   Awaiting response.//AB/CMA

## 2024-02-06 ENCOUNTER — Telehealth: Payer: Self-pay | Admitting: Student

## 2024-02-06 NOTE — Telephone Encounter (Signed)
 I reached to patient's cardiologist, Dr. Avanell Bob, in regards to patient's upcoming surgery.  Per Dr. Avanell Bob, "patient may stop Eliquis  2 days prior to her surgery.  She has no history of CVA in the past.  Coronary CT showed mild CAD.  She was asymptomatic when I saw her.  From a cardiac standpoint, I think she is at low risk for major cardiac event and okay to proceed with surgery".  I called the patient and let her know that her cardiologist was okay with her holding Eliquis  2 days prior to surgery.  Patient expressed understanding.

## 2024-02-14 NOTE — Progress Notes (Signed)
 I have reviewed the patient's encounter and agree with the documentation.  Jinny Mounts. Daneil Dunker, MD 02/14/2024 1:06 PM

## 2024-02-21 NOTE — Telephone Encounter (Signed)
 See note on (02/06/24) per Alaine Alken regarding Surgical Clearance with Dr. Ross-Cardiologist regarding patient stopping the Eliquis  for upcoming surgery.//AB/CMA

## 2024-02-25 ENCOUNTER — Other Ambulatory Visit: Payer: Self-pay | Admitting: Internal Medicine

## 2024-02-25 ENCOUNTER — Other Ambulatory Visit: Payer: Self-pay | Admitting: Plastic Surgery

## 2024-02-25 DIAGNOSIS — T8543XA Leakage of breast prosthesis and implant, initial encounter: Secondary | ICD-10-CM | POA: Diagnosis not present

## 2024-02-25 DIAGNOSIS — Z411 Encounter for cosmetic surgery: Secondary | ICD-10-CM | POA: Diagnosis not present

## 2024-02-25 DIAGNOSIS — T8579XA Infection and inflammatory reaction due to other internal prosthetic devices, implants and grafts, initial encounter: Secondary | ICD-10-CM | POA: Diagnosis not present

## 2024-02-27 LAB — SURGICAL PATHOLOGY

## 2024-02-29 ENCOUNTER — Other Ambulatory Visit: Payer: Self-pay | Admitting: Family Medicine

## 2024-03-04 ENCOUNTER — Encounter: Payer: Self-pay | Admitting: Plastic Surgery

## 2024-03-04 ENCOUNTER — Ambulatory Visit (INDEPENDENT_AMBULATORY_CARE_PROVIDER_SITE_OTHER): Payer: Medicare Other | Admitting: Plastic Surgery

## 2024-03-04 ENCOUNTER — Telehealth: Payer: Self-pay

## 2024-03-04 VITALS — BP 114/68 | HR 76 | Ht 62.0 in | Wt 123.0 lb

## 2024-03-04 DIAGNOSIS — T8543XA Leakage of breast prosthesis and implant, initial encounter: Secondary | ICD-10-CM

## 2024-03-04 NOTE — Telephone Encounter (Signed)
 Faxed referral to [facility] [place] with demographics, insurance card and most recent ov note included. Received fax success confirmation. Sent copy of referral to front desk for batch scanning.

## 2024-03-04 NOTE — Progress Notes (Signed)
 The patient is a 78 year old female here for follow-up after undergoing removal of ruptured implants and capsules last week.  She is doing really well.  The drain output has been minimal.  The incisions are healing nicely.  There is no sign of a hematoma or seroma.  I went ahead and got both drains removed.  She can take the dressing off in the next day or 2.  Continue with sports bra.  I gave her a slip for the store [facility].  Will plan to see her back in 2 weeks.  Will get pictures at her follow-up visit.

## 2024-03-06 ENCOUNTER — Ambulatory Visit

## 2024-03-06 DIAGNOSIS — M810 Age-related osteoporosis without current pathological fracture: Secondary | ICD-10-CM | POA: Diagnosis not present

## 2024-03-06 NOTE — Progress Notes (Unsigned)
 Patient ws in today for her Evenity  she tolerated well and will schedule for shot #3 next month

## 2024-03-07 ENCOUNTER — Other Ambulatory Visit: Payer: Self-pay | Admitting: Family Medicine

## 2024-03-07 ENCOUNTER — Encounter: Payer: Self-pay | Admitting: Family Medicine

## 2024-03-18 ENCOUNTER — Ambulatory Visit (INDEPENDENT_AMBULATORY_CARE_PROVIDER_SITE_OTHER): Payer: Medicare Other | Admitting: Student

## 2024-03-18 DIAGNOSIS — T8543XA Leakage of breast prosthesis and implant, initial encounter: Secondary | ICD-10-CM

## 2024-03-18 NOTE — Progress Notes (Signed)
 Patient is a 78 year old female who underwent removal of bilateral breast implants and complete capsulectomies with Dr. Orin Birk on 02/25/2024.  She is about 3 weeks postop.  She presents to the clinic today for postoperative follow-up.  Patient was last seen in the office on 03/04/2024.  At this visit, patient was doing really well.  Her drain output had been minimal and her incisions were healing nicely.  Drains were removed.  Today, patient reports she is doing well.  She states that she has some soreness and a burning type pain to her left breast.  She states that this is not constant, but notices it more upon some movements and pressing it.  She denies any fevers or chills.  She denies any drainage from either of her breast.  She denies any other issues or concerns at this time.  Chaperone present on exam.  On exam, patient is sitting upright in no acute distress.  Breasts are soft and fairly symmetric.  There is no overlying erythema to either breast.  There are no fluid collections palpated on exam.  There is some very mild tenderness to palpation to the left lateral breast.  There are no overlying skin changes.  NAC's appear to be healthy bilaterally.  Steri-Strip was in place over the right inframammary incision.  This was removed.  Incision was intact and healing well.  Incision to the left breast was also intact and healing well.  There are no signs of infection on exam.  I recommended that the patient gently massage the left breast to see if this helps with her pain.  Also recommended she continue gentle range of motion of her left upper extremity.  Discussed with her that this pain could be nerve related or could be from the capsulectomy.  Discussed with her that she should monitor her pain closely and if she feels that it is worsening, if she notices any redness, swelling or she has any concerns she should let us  know immediately.  Patient expressed understanding.  Discussed with the patient  to make sure she is wearing compression at all times and avoid heavy lifting.  Discussed with the patient she can apply a small mount of Vaseline to her incisions once daily.  Patient expressed understanding.  We will plan to see the patient back in 2 to 3 weeks.  I instructed her to call in the meantime she has any questions or concerns about anything.  Pictures were obtained of the patient and placed in the chart with the patient's or guardian's permission.

## 2024-03-31 ENCOUNTER — Ambulatory Visit (INDEPENDENT_AMBULATORY_CARE_PROVIDER_SITE_OTHER): Payer: Medicare Other | Admitting: Family Medicine

## 2024-03-31 ENCOUNTER — Encounter: Payer: Self-pay | Admitting: Family Medicine

## 2024-03-31 VITALS — BP 129/79 | HR 71 | Temp 97.9°F | Ht 62.0 in | Wt 120.0 lb

## 2024-03-31 DIAGNOSIS — R42 Dizziness and giddiness: Secondary | ICD-10-CM | POA: Diagnosis not present

## 2024-03-31 DIAGNOSIS — Z131 Encounter for screening for diabetes mellitus: Secondary | ICD-10-CM

## 2024-03-31 DIAGNOSIS — E663 Overweight: Secondary | ICD-10-CM | POA: Diagnosis not present

## 2024-03-31 DIAGNOSIS — I10 Essential (primary) hypertension: Secondary | ICD-10-CM | POA: Diagnosis not present

## 2024-03-31 DIAGNOSIS — I4891 Unspecified atrial fibrillation: Secondary | ICD-10-CM

## 2024-03-31 MED ORDER — WEGOVY 0.5 MG/0.5ML ~~LOC~~ SOAJ: 0.5000 mg | SUBCUTANEOUS | 3 refills | Status: DC

## 2024-03-31 MED ORDER — CYCLOBENZAPRINE HCL 10 MG PO TABS: 10.0000 mg | ORAL_TABLET | Freq: Three times a day (TID) | ORAL | 0 refills | Status: AC | PRN

## 2024-03-31 NOTE — Progress Notes (Signed)
   Denise Payne is a 78 y.o. female who presents today for an office visit.  Assessment/Plan:  New/Acute Problems: Dizziness  No red flags.  Overall reassuring exam.  History consistent with orthostasis.  She declined checking orthostatic vitals today.  Will check labs including CBC, c-Met, and TSH.  She is currently on metoprolol  which is likely contributing.  Encouraged hydration as well.  She is planning on following up with cardiology soon and can discuss with them as well.   Chronic Problems Addressed Today: Benign essential HTN At goal today both sounds like she is having occasional orthostasis.  Will checking labs as above.  She will continue with metoprolol  tartrate 50 mg twice daily however if this persist would consider decreasing dose or discontinuing beta-blocker.  She will also discuss with cardiology at her upcoming appointment with them.  Overweight BMI 22 today.  She is currently on Wegovy  0.5 mg every week.  She is doing well with this and would like to maintain current dose.  Will refill today.  Atrial fibrillation (HCC) She had a few flares of A-fib over the last several weeks related to her recent breast surgery.  Regular rate and rhythm today.  She is on metoprolol  tartrate and use of diltiazem  as needed.  She will be following up with cardiology soon as above.     Subjective:  HPI:  See Assessment / plan for status of chronic conditions.  Patient is here today for 46-month follow-up.  Overall is doing well today.  She request refill on her Wegovy  and Flexeril .  Doing well with both these.  She is having some mild side effects with the Wegovy  but is overall tolerating well.  She has noticed an occasional dizzy spell the last couple of weeks.  This usually happens when standing up.  Symptoms last for a few moments and then go away.  No weakness or numbness.  No current symptoms.  She has had a few intermittent issues with atrial fibrillation after her recent breast  surgery.        Objective:  Physical Exam: BP 129/79   Pulse 71   Temp 97.9 F (36.6 C) (Temporal)   Ht 5' 2 (1.575 m)   Wt 120 lb (54.4 kg)   SpO2 99%   BMI 21.95 kg/m   Wt Readings from Last 3 Encounters:  03/31/24 120 lb (54.4 kg)  03/04/24 123 lb (55.8 kg)  02/01/24 124 lb 9.6 oz (56.5 kg)     Gen: No acute distress, resting comfortably CV: Regular rate and rhythm with no murmurs appreciated Pulm: Normal work of breathing, clear to auscultation bilaterally with no crackles, wheezes, or rhonchi Neuro: Grossly normal, moves all extremities Psych: Normal affect and thought content      Edan Serratore M. Daneil Dunker, MD 03/31/2024 11:46 AM

## 2024-03-31 NOTE — Assessment & Plan Note (Signed)
 At goal today both sounds like she is having occasional orthostasis.  Will checking labs as above.  She will continue with metoprolol  tartrate 50 mg twice daily however if this persist would consider decreasing dose or discontinuing beta-blocker.  She will also discuss with cardiology at her upcoming appointment with them.

## 2024-03-31 NOTE — Assessment & Plan Note (Signed)
 BMI 22 today.  She is currently on Wegovy  0.5 mg every week.  She is doing well with this and would like to maintain current dose.  Will refill today.

## 2024-03-31 NOTE — Assessment & Plan Note (Signed)
 She had a few flares of A-fib over the last several weeks related to her recent breast surgery.  Regular rate and rhythm today.  She is on metoprolol  tartrate and use of diltiazem  as needed.  She will be following up with cardiology soon as above.

## 2024-03-31 NOTE — Patient Instructions (Signed)
 It was very nice to see you today!  We will check blood work today.  I will refill your medications.  I will see you back in 6 months.  Come back sooner if needed.  Return in about 6 months (around 09/30/2024).   Take care, Dr Daneil Dunker  PLEASE NOTE:  If you had any lab tests, please let us  know if you have not heard back within a few days. You may see your results on mychart before we have a chance to review them but we will give you a call once they are reviewed by us .   If we ordered any referrals today, please let us  know if you have not heard from their office within the next week.   If you had any urgent prescriptions sent in today, please check with the pharmacy within an hour of our visit to make sure the prescription was transmitted appropriately.   Please try these tips to maintain a healthy lifestyle:  Eat at least 3 REAL meals and 1-2 snacks per day.  Aim for no more than 5 hours between eating.  If you eat breakfast, please do so within one hour of getting up.   Each meal should contain half fruits/vegetables, one quarter protein, and one quarter carbs (no bigger than a computer mouse)  Cut down on sweet beverages. This includes juice, soda, and sweet tea.   Drink at least 1 glass of water with each meal and aim for at least 8 glasses per day  Exercise at least 150 minutes every week.

## 2024-04-01 ENCOUNTER — Ambulatory Visit: Admitting: Student

## 2024-04-01 ENCOUNTER — Ambulatory Visit: Payer: Self-pay | Admitting: Family Medicine

## 2024-04-01 ENCOUNTER — Encounter: Payer: Self-pay | Admitting: Student

## 2024-04-01 VITALS — BP 132/79 | HR 81

## 2024-04-01 DIAGNOSIS — T8543XA Leakage of breast prosthesis and implant, initial encounter: Secondary | ICD-10-CM

## 2024-04-01 LAB — CBC
HCT: 39.7 % (ref 35.0–45.0)
Hemoglobin: 12.8 g/dL (ref 11.7–15.5)
MCH: 29.2 pg (ref 27.0–33.0)
MCHC: 32.2 g/dL (ref 32.0–36.0)
MCV: 90.4 fL (ref 80.0–100.0)
MPV: 12.3 fL (ref 7.5–12.5)
Platelets: 51 10*3/uL — ABNORMAL LOW (ref 140–400)
RBC: 4.39 10*6/uL (ref 3.80–5.10)
RDW: 11.9 % (ref 11.0–15.0)
WBC: 5.8 10*3/uL (ref 3.8–10.8)

## 2024-04-01 LAB — HEMOGLOBIN A1C
Hgb A1c MFr Bld: 5.1 % (ref <5.7)
Mean Plasma Glucose: 100 mg/dL
eAG (mmol/L): 5.5 mmol/L

## 2024-04-01 LAB — COMPREHENSIVE METABOLIC PANEL WITH GFR
AG Ratio: 2.1 (calc) (ref 1.0–2.5)
ALT: 12 U/L (ref 6–29)
AST: 17 U/L (ref 10–35)
Albumin: 4.1 g/dL (ref 3.6–5.1)
Alkaline phosphatase (APISO): 101 U/L (ref 37–153)
BUN: 11 mg/dL (ref 7–25)
CO2: 25 mmol/L (ref 20–32)
Calcium: 8.9 mg/dL (ref 8.6–10.4)
Chloride: 107 mmol/L (ref 98–110)
Creat: 0.67 mg/dL (ref 0.60–1.00)
Globulin: 2 g/dL (ref 1.9–3.7)
Glucose, Bld: 84 mg/dL (ref 65–99)
Potassium: 4.2 mmol/L (ref 3.5–5.3)
Sodium: 140 mmol/L (ref 135–146)
Total Bilirubin: 0.5 mg/dL (ref 0.2–1.2)
Total Protein: 6.1 g/dL (ref 6.1–8.1)
eGFR: 89 mL/min/{1.73_m2} (ref >=60)

## 2024-04-01 LAB — TSH: TSH: 1.33 m[IU]/L (ref 0.40–4.50)

## 2024-04-01 NOTE — Progress Notes (Signed)
 Her platelets are low but the rest of her labs are all normal.  It looks like her low platelet count was due to clumping in her specimen.  The lab tech reviewed her smear manually and her platelet count was adequate.  We can recheck this again at her next office visit.  Do not need to do any other testing at this point.  If she is still having ongoing dizziness recommend she discuss with cardiology about decreasing her dose of metoprolol .

## 2024-04-01 NOTE — Progress Notes (Cosign Needed)
 Patient is a 78 year old female who underwent removal of bilateral breast implants and complete capsulectomies with Dr. Orin Birk on 02/25/2024.  Patient is about 5 weeks postop.  She presents to the clinic today for postoperative follow-up.  Patient was last seen in the clinic on 03/18/2024.  At this visit, patient was doing well.  On exam, breasts were soft and fairly symmetric.  There are no fluid collections on exam.  There was some very mild tenderness to palpation to the left lateral breast.  There were no overlying skin changes.  NAC's were healthy bilaterally.  Incisions were intact and healing well.  Recommended that patient gently massage the left breast and continue with gentle range of motion.  Today, patient reports she is doing well.  She states that pain to her left breast has completely gone away.  She denies any new issues or concerns.  She denies any fevers or chills.  Chaperone present on exam.  On exam, patient is sitting upright in no acute distress.  Breasts are soft and symmetric.  There is no overlying erythema.  No obvious fluid questions palpated on exam.  NAC's are healthy bilaterally.  Incisions appear to be intact and well-healed.  There are no signs of infection on exam.  Discussed with patient that I do like her to wear compression for 1 more week.  Also discussed with her that she can start gradually increasing her activities as of next week.  Patient expressed understanding.  Discussed with patient she may start applying scar creams to her incisions if she would like.  Patient to follow-up as needed.  I instructed her to call if she has any questions or concerns.

## 2024-04-10 DIAGNOSIS — R5383 Other fatigue: Secondary | ICD-10-CM | POA: Diagnosis not present

## 2024-04-10 DIAGNOSIS — M3501 Sicca syndrome with keratoconjunctivitis: Secondary | ICD-10-CM | POA: Diagnosis not present

## 2024-04-10 DIAGNOSIS — R600 Localized edema: Secondary | ICD-10-CM | POA: Diagnosis not present

## 2024-04-10 DIAGNOSIS — M810 Age-related osteoporosis without current pathological fracture: Secondary | ICD-10-CM | POA: Diagnosis not present

## 2024-04-10 DIAGNOSIS — M1991 Primary osteoarthritis, unspecified site: Secondary | ICD-10-CM | POA: Diagnosis not present

## 2024-04-10 DIAGNOSIS — Z6822 Body mass index (BMI) 22.0-22.9, adult: Secondary | ICD-10-CM | POA: Diagnosis not present

## 2024-04-10 DIAGNOSIS — M25561 Pain in right knee: Secondary | ICD-10-CM | POA: Diagnosis not present

## 2024-04-11 ENCOUNTER — Ambulatory Visit

## 2024-04-30 ENCOUNTER — Telehealth: Payer: Self-pay

## 2024-04-30 NOTE — Telephone Encounter (Signed)
 ordered

## 2024-05-09 ENCOUNTER — Ambulatory Visit

## 2024-05-09 DIAGNOSIS — M180 Bilateral primary osteoarthritis of first carpometacarpal joints: Secondary | ICD-10-CM

## 2024-05-09 DIAGNOSIS — M810 Age-related osteoporosis without current pathological fracture: Secondary | ICD-10-CM | POA: Diagnosis not present

## 2024-05-09 NOTE — Progress Notes (Signed)
 Patient was seen in the office today for her Evenity  Injections.  Received 1 dose of Evenity  into her RT upper arm and 1 dose of Evenity  into her LT upper arm.  Patient tolerated both injections well.

## 2024-06-02 ENCOUNTER — Other Ambulatory Visit: Payer: Self-pay | Admitting: Family Medicine

## 2024-06-03 ENCOUNTER — Ambulatory Visit (HOSPITAL_COMMUNITY)
Admission: RE | Admit: 2024-06-03 | Discharge: 2024-06-03 | Disposition: A | Discharge status: Home / Self Care | Source: Ambulatory Visit | Attending: Family Medicine | Admitting: Family Medicine

## 2024-06-03 ENCOUNTER — Ambulatory Visit (INDEPENDENT_AMBULATORY_CARE_PROVIDER_SITE_OTHER): Admitting: Family Medicine

## 2024-06-03 ENCOUNTER — Encounter: Payer: Self-pay | Admitting: Family Medicine

## 2024-06-03 VITALS — BP 146/76 | HR 68 | Temp 97.3°F | Ht 62.0 in | Wt 125.2 lb

## 2024-06-03 DIAGNOSIS — R221 Localized swelling, mass and lump, neck: Secondary | ICD-10-CM

## 2024-06-03 DIAGNOSIS — E663 Overweight: Secondary | ICD-10-CM

## 2024-06-03 DIAGNOSIS — M3500 Sicca syndrome, unspecified: Secondary | ICD-10-CM

## 2024-06-03 NOTE — Assessment & Plan Note (Signed)
 Currently on Wegovy  0.5 mg weekly which helps maintain her current weight.  She was recently informed insurance will likely stop paying for this at the end of the year.  She is currently exploring other options to have this available to her.

## 2024-06-03 NOTE — Progress Notes (Signed)
   Denise Payne is a 78 y.o. female who presents today for an office visit.  Assessment/Plan:  New/Acute Problems: Neck Mass Patient with subtle mass in right submandibular area.  She is not sure how long this has been present.  May be related to Sjogren syndrome and obstructing salivary gland.  Low suspicion for lymphadenopathy however we will check an ultrasound to further evaluate.  Chronic Problems Addressed Today: Sjogren's syndrome (HCC) Follows with rheumatology.  She having significant dental and mouth issues.  May be contributing to her above neck mass.  Currently on Plaquenil  200 mg daily.  Overweight Currently on Wegovy  0.5 mg weekly which helps maintain her current weight.  She was recently informed insurance will likely stop paying for this at the end of the year.  She is currently exploring other options to have this available to her.     Subjective:  HPI:  See assessment / plan for status of chronic conditions.  Discussed the use of AI scribe software for clinical note transcription with the patient, who gave verbal consent to proceed.  History of Present Illness Denise Payne is a 78 year old female with Sjogren's syndrome who presents with a neck lump.  She discovered a lump in her right neck during a routine dental cleaning, which is not visible unless she turns her head and is not painful to touch. She is unsure of its duration. No associated symptoms such as sore throat or difficulty swallowing are present.  She has Sjogren's syndrome, leading to significant dental issues, including dry mouth and the need for multiple crown replacements. She uses a probiotic to help restore good bacteria in her mouth.  She is experiencing issues with her [employer] coverage, which will no longer cover certain medications after December. She is considering obtaining medications and dental work in [place] due to cost concerns.         Objective:  Physical Exam: BP  (!) 146/76   Pulse 68   Temp (!) 97.3 F (36.3 C) (Temporal)   Ht 5' 2 (1.575 m)   Wt 125 lb 3.2 oz (56.8 kg)   SpO2 98%   BMI 22.90 kg/m   Gen: No acute distress, resting comfortably HEENT: Subtle approximately 1 cm mobile mass noted in right submandibular area. CV: Regular rate and rhythm with no murmurs appreciated Pulm: Normal work of breathing, clear to auscultation bilaterally with no crackles, wheezes, or rhonchi Neuro: Grossly normal, moves all extremities Psych: Normal affect and thought content      Denise Payne M. Kennyth, MD 06/03/2024 11:20 AM

## 2024-06-03 NOTE — Patient Instructions (Addendum)
 It was very nice to see you today!  VISIT SUMMARY: During your visit, we discussed a lump in your neck, your ongoing Sjogren's syndrome, and issues with your medication coverage. We have planned further evaluations and discussed potential solutions for your medication needs.  YOUR PLAN: NECK MASS: A subtle, non-painful lump in your neck was found, which is not typical for a lymph node. -We will perform a neck ultrasound at [facility] to evaluate the mass as soon as possible.   Return if symptoms worsen or fail to improve.   Take care, Dr Kennyth  PLEASE NOTE:  If you had any lab tests, please let us  know if you have not heard back within a few days. You may see your results on mychart before we have a chance to review them but we will give you a call once they are reviewed by us .   If we ordered any referrals today, please let us  know if you have not heard from their office within the next week.   If you had any urgent prescriptions sent in today, please check with the pharmacy within an hour of our visit to make sure the prescription was transmitted appropriately.   Please try these tips to maintain a healthy lifestyle:  Eat at least 3 REAL meals and 1-2 snacks per day.  Aim for no more than 5 hours between eating.  If you eat breakfast, please do so within one hour of getting up.   Each meal should contain half fruits/vegetables, one quarter protein, and one quarter carbs (no bigger than a computer mouse)  Cut down on sweet beverages. This includes juice, soda, and sweet tea.   Drink at least 1 glass of water with each meal and aim for at least 8 glasses per day  Exercise at least 150 minutes every week.

## 2024-06-03 NOTE — Assessment & Plan Note (Signed)
 Follows with rheumatology.  She having significant dental and mouth issues.  May be contributing to her above neck mass.  Currently on Plaquenil  200 mg daily.

## 2024-06-04 ENCOUNTER — Ambulatory Visit: Payer: Self-pay | Admitting: Family Medicine

## 2024-06-04 NOTE — Progress Notes (Signed)
 Good news! Her ultrasound shows a normal-appearing lymph node.  Do not need to do any further workup at this time though she should let us  know if she has any change in symptoms.

## 2024-06-10 ENCOUNTER — Ambulatory Visit: Admitting: Physician Assistant

## 2024-06-19 ENCOUNTER — Encounter: Payer: Self-pay | Admitting: Physician Assistant

## 2024-06-19 ENCOUNTER — Ambulatory Visit (INDEPENDENT_AMBULATORY_CARE_PROVIDER_SITE_OTHER): Admitting: Physician Assistant

## 2024-06-19 DIAGNOSIS — M8000XA Age-related osteoporosis with current pathological fracture, unspecified site, initial encounter for fracture: Secondary | ICD-10-CM | POA: Diagnosis not present

## 2024-06-19 NOTE — Progress Notes (Signed)
 Office Visit Note   Patient: Denise Payne           Date of Birth: Dec 23, 1945           MRN: [id number] Visit Date: 06/19/2024              Requested by: Kennyth Worth HERO, MD [address] [place],  [place] [address] PCP: Kennyth Worth HERO, MD  Chief Complaint  Patient presents with   Injections    Patient in today for evenity  injection       HPI: Patient is a pleasant 78 year old woman who has been seeing us  for Evenity  injections.  She recently completed her fifth injection.  Denies any difficulty or side effects other than her arms being sore after the injection.  Denies any muscle cramping denies any paresthesias.  Assessment & Plan: Visit Diagnoses:  1. Age-related osteoporosis with current pathological fracture, initial encounter     Plan: Will go forward with vitamin D  and calcium  draw today.  Will follow-up in 1 month counseled her about continuing to be good about taking her vitamin D  and calcium   Follow-Up Instructions: No follow-ups on file.   Ortho Exam  Patient is alert, oriented, no adenopathy, well-dressed, normal affect, normal respiratory effort.     Imaging: No results found. No images are attached to the encounter.  Labs: Lab Results  Component Value Date   HGBA1C 5.1 03/31/2024   HGBA1C 5.2 12/27/2021   HGBA1C 5.1 05/20/2019   ESRSEDRATE 10 02/05/2023   CRP <1.0 02/05/2023   REPTSTATUS 12/11/2020 FINAL 12/06/2020   CULT  12/06/2020    NO GROWTH 5 DAYS Performed at [facility], [address], [place], [place] [address]      Lab Results  Component Value Date   ALBUMIN 4.0 02/05/2023   ALBUMIN 3.6 01/25/2022   ALBUMIN 4.2 12/27/2021   PREALBUMIN 5.9 (L) 02/04/2019    Lab Results  Component Value Date   MG 1.8 01/25/2022   MG 1.4 (L) 02/19/2019   MG 2.3 02/07/2019   Lab Results  Component Value Date   VD25OH 87 07/23/2023    Lab Results  Component Value Date   PREALBUMIN 5.9 (L) 02/04/2019      Latest Ref Rng &  Units 03/31/2024   12:05 PM 02/05/2023   11:27 AM 04/24/2022    2:27 PM  CBC EXTENDED  WBC 3.8 - 10.8 Thousand/uL 5.8  6.7  6.8   RBC 3.80 - 5.10 Million/uL 4.39  4.03  4.17   Hemoglobin 11.7 - 15.5 g/dL 87.1  87.9  88.3   HCT 35.0 - 45.0 % 39.7  35.2  36.1   Platelets 140 - 400 Thousand/uL 51  222.0  277   NEUT# 1.4 - 7.7 K/uL  4.4  4.6   Lymph# 0.7 - 4.0 K/uL  1.6  1.4      There is no height or weight on file to calculate BMI.  Orders:  No orders of the defined types were placed in this encounter.  No orders of the defined types were placed in this encounter.    Procedures: No procedures performed  Clinical Data: No additional findings.  ROS:  All other systems negative, except as noted in the HPI. Review of Systems  Objective: Vital Signs: There were no vitals taken for this visit.  Specialty Comments:  No specialty comments available.  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   Atherosclerosis of aorta (HCC) 12/15/2020   Benign essential HTN 04/12/2020   GERD (gastroesophageal reflux disease) 03/12/2020   Osteoporosis 03/12/2020   Asthma 01/26/2019   Sjogren's syndrome (HCC) 01/26/2019   Past Medical History:  Diagnosis Date   Anemia    Asthma    Atrial fibrillation (HCC)    Cataract    Gallstones    GERD (gastroesophageal reflux disease)    High blood pressure    Osteoporosis    Sjogren's syndrome (HCC) 01/26/2019    Family History  Problem Relation Age of Onset   Breast cancer Mother 22   Hearing loss Mother    Heart disease Mother    High  Cholesterol Mother    Hypertension Mother    Cancer Father    Hearing loss Father    Stroke Father    Transient ischemic attack Father    Arthritis Maternal Grandmother    Cancer Maternal Grandmother    Early death Maternal Grandmother    Diabetes Maternal Grandfather    Early death Maternal Grandfather    Hearing loss Maternal Grandfather    Heart attack Maternal Grandfather    Heart disease Maternal Grandfather    Arthritis Paternal Grandmother    Early death Paternal Grandmother    Hearing loss Paternal Grandmother    Hyperlipidemia Paternal Grandmother    Early death Paternal Grandfather    Kidney disease Paternal Grandfather    Cancer Sister    Hyperlipidemia Sister    Hypertension Sister    Birth defects Brother    Cancer Brother    Hyperlipidemia Brother     Past Surgical History:  Procedure Laterality Date   A-FLUTTER ABLATION N/A 05/17/2022   Procedure: A-FLUTTER ABLATION;  Surgeon: Waddell Danelle ORN, MD;  Location: [facility];  Service: Cardiovascular;  Laterality: N/A;   ABDOMINAL HYSTERECTOMY  1996   ANKLE FRACTURE SURGERY  2005   AUGMENTATION MAMMAPLASTY Bilateral 10/16/1976   CHOLECYSTECTOMY N/A 12/07/2020   Procedure: LAPAROSCOPIC CHOLECYSTECTOMY WITH INTRAOPERATIVE CHOLANGIOGRAM;  Surgeon: Stevie Herlene Righter, MD;  Location: [facility];  Service: General;  Laterality: N/A;   CYSTOSCOPY WITH STENT PLACEMENT N/A 05/22/2019   Procedure: CYSTOSCOPY WITH STENT PLACEMENT;  Surgeon: Watt Rush, MD;  Location: [facility];  Service: Urology;  Laterality: N/A;   FLEXIBLE SIGMOIDOSCOPY N/A 05/22/2019   Procedure: FLEXIBLE SIGMOIDOSCOPY;  Surgeon: Teresa Lonni HERO, MD;  Location: [facility];  Service: General;  Laterality: N/A;   XI ROBOTIC ASSISTED LOWER ANTERIOR RESECTION Bilateral 05/22/2019   Procedure: XI ROBOTIC ASSISTED SIGMOIDECTOMY;  Surgeon: Teresa Lonni HERO, MD;  Location: [facility];  Service: General;  Laterality: Bilateral;   Social History   Occupational History    Occupation: retired  Tobacco Use   Smoking status: Never   Smokeless tobacco: Never  Vaping Use   Vaping status: Never Used  Substance and Sexual Activity   Alcohol  use: Yes    Alcohol /week: 8.0 standard drinks of alcohol     Types: 8 Glasses of wine per week    Comment: 3 times daily   Drug use: Never   Sexual activity: Not Currently

## 2024-06-19 NOTE — Addendum Note (Signed)
 Addended by: RODGERS LACY on: 06/19/2024 02:21 PM   Modules accepted: Orders

## 2024-06-20 ENCOUNTER — Ambulatory Visit: Payer: Self-pay | Admitting: Physician Assistant

## 2024-06-20 LAB — VITAMIN D 25 HYDROXY (VIT D DEFICIENCY, FRACTURES): Vit D, 25-Hydroxy: 81 ng/mL (ref 30–100)

## 2024-06-20 LAB — CALCIUM: Calcium: 9.1 mg/dL (ref 8.6–10.4)

## 2024-07-07 ENCOUNTER — Telehealth: Payer: Self-pay | Admitting: Plastic Surgery

## 2024-07-07 NOTE — Telephone Encounter (Signed)
 Daughter called and stated that they rec a bill from the Surgery center to have the implants removed bc Dr D put in the op notes that it was cosmetic and they don't believe it should have been noted as cosmetic, they assumed everything was to be covered by ins, Pt daughter stated they need Dr Lowery to change her op note and have the ins cover the surgery center portion. I did ask the daughter and she stated that they were put in org years ago as cosmetic but said they assumed it would be covered by ins bc they ruptured. Sx sch Please advise

## 2024-07-09 NOTE — Telephone Encounter (Signed)
 Hey, see msg in regards to implant removal and concerns related to billing cosmetic vs insurance.

## 2024-07-10 ENCOUNTER — Telehealth: Payer: Self-pay | Admitting: *Deleted

## 2024-07-10 NOTE — Telephone Encounter (Signed)
 No authorization was required from Medicare for implant removal and capsulectomy. Also due to patient having ruptured SILICONE implants as long as documentation showed as much it should have met medical necessity guidelines for removal.

## 2024-07-14 ENCOUNTER — Telehealth: Payer: Self-pay

## 2024-07-14 NOTE — Telephone Encounter (Signed)
 I attempted to call the patient about her message regarding her surgery. There was no answer so I left a voicemail.

## 2024-07-15 NOTE — Telephone Encounter (Signed)
 I called the patient back today and was able to speak with her. I let her know that Dr. Lowery was able to addend her note. Patient conveyed understanding.

## 2024-07-17 NOTE — Progress Notes (Signed)
 Cardiology Office Note   Date:  07/18/2024   ID:  Lance Fishbaugh, DOB June 20, 1946, MRN [id number]  PCP:  Kennyth Worth HERO, MD  Cardiologist:   Vina Gull, MD    Pt presents for follow up of paroxysmal atrial fibrillation     History of Present Illness: Denise Payne is a 78 y.o. female with a history of atrial flutter  (s/p ablation) and PAF   July 2024   CCTA showed Ca score 188   Mild plaquing of coronary arteries   I saw the pt in clinic in 2024     Since seen she says she feels she is in and out of afib Episodes short lived   Usually when sitting    Will feel weak  She denies CP   Breathing is OK Admits to not exercising much      Current Meds  Medication Sig   acetaminophen  (TYLENOL ) 500 MG tablet Take 1,000 mg by mouth every 6 (six) hours as needed for mild pain.   Biotin  10000 MCG TABS Take 10,000 mcg by mouth daily.   Cholecalciferol  (VITAMIN D ) 50 MCG (2000 UT) tablet Take 2,000 Units by mouth daily.   cyclobenzaprine  (FLEXERIL ) 10 MG tablet Take 1 tablet (10 mg total) by mouth 3 (three) times daily as needed for muscle spasms.   diclofenac sodium (VOLTAREN) 1 % GEL Apply 2 g topically 2 (two) times daily as needed (pain).    ELIQUIS  5 MG TABS tablet TAKE 1 TABLET TWICE A DAY   esomeprazole  (NEXIUM ) 40 MG capsule TAKE 1 CAPSULE DAILY   hydroxychloroquine  (PLAQUENIL ) 200 MG tablet Take 200 mg by mouth daily.   metoprolol  tartrate (LOPRESSOR ) 50 MG tablet TAKE 1 TABLET TWICE A DAY (DOSE CHANGE/ INCREASED)   montelukast  (SINGULAIR ) 10 MG tablet TAKE 1 TABLET AT BEDTIME   Probiotic Product (PROBIOTIC PO) Take 1 tablet by mouth daily.   Romosozumab -aqqg (EVENITY  [place]) Inject 1 Dose into the skin every 30 (thirty) days.   rosuvastatin  (CRESTOR ) 10 MG tablet TAKE 1 TABLET DAILY   Semaglutide -Weight Management (WEGOVY ) 0.5 MG/0.5ML SOAJ Inject 0.5 mg into the skin once a week.   vitamin B-12 (CYANOCOBALAMIN ) 1000 MCG tablet Take 1,000 mcg by mouth daily.       Allergies:   Latex, Morphine , Meperidine hcl, and Onion   Past Medical History:  Diagnosis Date   Anemia    Asthma    Atrial fibrillation (HCC)    Cataract    Gallstones    GERD (gastroesophageal reflux disease)    High blood pressure    Osteoporosis    Sjogren's syndrome 01/26/2019    Past Surgical History:  Procedure Laterality Date   A-FLUTTER ABLATION N/A 05/17/2022   Procedure: A-FLUTTER ABLATION;  Surgeon: Waddell Danelle ORN, MD;  [facility]: [facility];  Service: Cardiovascular;  Laterality: N/A;   ABDOMINAL HYSTERECTOMY  1996   ANKLE FRACTURE SURGERY  2005   AUGMENTATION MAMMAPLASTY Bilateral 10/16/1976   CHOLECYSTECTOMY N/A 12/07/2020   Procedure: LAPAROSCOPIC CHOLECYSTECTOMY WITH INTRAOPERATIVE CHOLANGIOGRAM;  Surgeon: Stevie Herlene Righter, MD;  Location: [facility];  Service: General;  Laterality: N/A;   CYSTOSCOPY WITH STENT PLACEMENT N/A 05/22/2019   Procedure: CYSTOSCOPY WITH STENT PLACEMENT;  Surgeon: Watt Rush, MD;  Location: [facility];  Service: Urology;  Laterality: N/A;   FLEXIBLE SIGMOIDOSCOPY N/A 05/22/2019   Procedure: FLEXIBLE SIGMOIDOSCOPY;  Surgeon: Teresa Lonni HERO, MD;  Location: [facility];  Service: General;  Laterality: N/A;   XI ROBOTIC ASSISTED LOWER ANTERIOR  RESECTION Bilateral 05/22/2019   Procedure: XI ROBOTIC ASSISTED SIGMOIDECTOMY;  Surgeon: Teresa Lonni HERO, MD;  Location: [facility];  Service: General;  Laterality: Bilateral;     Social History:  The patient  reports that she has never smoked. She has never used smokeless tobacco. She reports current alcohol  use of about 8.0 standard drinks of alcohol  per week. She reports that she does not use drugs.   Family History:  The patient's family history includes Arthritis in her maternal grandmother and paternal grandmother; Birth defects in her brother; Breast cancer (age of onset: 19) in her mother; Cancer in her brother, father, maternal grandmother, and sister; Diabetes in her maternal  grandfather; Early death in her maternal grandfather, maternal grandmother, paternal grandfather, and paternal grandmother; Hearing loss in her father, maternal grandfather, mother, and paternal grandmother; Heart attack in her maternal grandfather; Heart disease in her maternal grandfather and mother; High Cholesterol in her mother; Hyperlipidemia in her brother, paternal grandmother, and sister; Hypertension in her mother and sister; Kidney disease in her paternal grandfather; Stroke in her father; Transient ischemic attack in her father.    ROS:  Please see the history of present illness. All other systems are reviewed and  Negative to the above problem except as noted.    PHYSICAL EXAM: VS:  BP 116/72   Pulse (!) 52   Ht 5' 2 (1.575 m)   Wt 123 lb 12.8 oz (56.2 kg)   SpO2 99%   BMI 22.64 kg/m   GEN: Thin 78 yo  in no acute distress  HEENT: normal  Neck: no JVD, carotid bruit Cardiac: RRR; no murmurs   Respiratory:  clear to auscultation bilaterally,  Abd  Supple  No masses Ext   Wthout edema    EKG:  EKG not done   CCTA   2024  IMPRESSION: 1. Coronary calcium  score of 188. This was 67th percentile for age, sex, and race matched control.   2. Normal coronary origin with right dominance.   3. CAD-RADS 2. Mild non-obstructive CAD (25-49%). Consider non-atherosclerotic causes of chest pain. Consider preventive therapy and risk factor modification.   Echo APril 2023    1. Left ventricular ejection fraction, by estimation, is 60 to 65%. The  left ventricle has normal function. The left ventricle has no regional  wall motion abnormalities. Left ventricular diastolic parameters were  normal.   2. Right ventricular systolic function is low normal. The right  ventricular size is normal. There is normal pulmonary artery systolic  pressure.   3. The mitral valve is grossly normal. Mild mitral valve regurgitation.   4. The aortic valve is normal in structure. Aortic valve  regurgitation is  not visualized. No aortic stenosis is present.  Lipid Panel    Component Value Date/Time   CHOL 150 12/27/2021 1212   TRIG 113.0 12/27/2021 1212   HDL 72.90 12/27/2021 1212   CHOLHDL 2 12/27/2021 1212   VLDL 22.6 12/27/2021 1212   LDLCALC 54 12/27/2021 1212      Wt Readings from Last 3 Encounters:  07/18/24 123 lb 12.8 oz (56.2 kg)  06/03/24 125 lb 3.2 oz (56.8 kg)  03/31/24 120 lb (54.4 kg)      ASSESSMENT AND PLAN:  1  Rhythm   Pt reports episodes that she feels are afib   Interesting in that short lived and she says usually when sitting     Will set up for a 2 week monitor to capture what she is sensing  Keep on same meds    2   Atrial flutter s/p ablation    3 CAD  Mild on CCTA  No symptoms of angin    4   PVCs  Schedule for monitor  5   Dyspnea  pt denies   6  HTN  BP is well controlled   7  Lipids   Pt on Crestor   In Dec 2024 LDL was 54  HDL 64  Trigl 78   Follow up in 6 monthss Current medicines are reviewed at length with the patient today.  The patient does not have concerns regarding medicines.  Signed, Vina Gull, MD  07/18/2024 9:04 AM    [facility] [address] [place], [place], [place]  [address] Phone: [phone]; Fax: [phone]

## 2024-07-18 ENCOUNTER — Ambulatory Visit: Attending: Internal Medicine | Admitting: Internal Medicine

## 2024-07-18 ENCOUNTER — Encounter: Payer: Self-pay | Admitting: Internal Medicine

## 2024-07-18 ENCOUNTER — Ambulatory Visit

## 2024-07-18 VITALS — BP 116/72 | HR 52 | Ht 62.0 in | Wt 123.8 lb

## 2024-07-18 DIAGNOSIS — E785 Hyperlipidemia, unspecified: Secondary | ICD-10-CM | POA: Insufficient documentation

## 2024-07-18 DIAGNOSIS — I4891 Unspecified atrial fibrillation: Secondary | ICD-10-CM

## 2024-07-18 DIAGNOSIS — I700 Atherosclerosis of aorta: Secondary | ICD-10-CM | POA: Insufficient documentation

## 2024-07-18 NOTE — Progress Notes (Unsigned)
 Enrolled for [employer] to mail a ZIO XT long term holter monitor to the patients address on file.

## 2024-07-18 NOTE — Patient Instructions (Addendum)
 Medication Instructions:  Your physician recommends that you continue on your current medications as directed. Please refer to the Current Medication list given to you today.  *If you need a refill on your cardiac medications before your next appointment, please call your pharmacy*  Testing/Procedures: Your physician has requested that you wear a Zio heart monitor for 14 days. This will be mailed to your home with instructions on how to apply the monitor and how to return it when finished. Please allow 2 weeks after returning the heart monitor before our office calls you with the results.   Follow-Up: At [facility], you and your health needs are our priority.  As part of our continuing mission to provide you with exceptional heart care, our providers are all part of one team.  This team includes your primary Cardiologist (physician) and Advanced Practice Providers or APPs (Physician Assistants and Nurse Practitioners) who all work together to provide you with the care you need, when you need it.  Your next appointment:   8 month(s)  Provider:   Vina Gull, MD  We recommend signing up for the patient portal called MyChart.  Sign up information is provided on this After Visit Summary.  MyChart is used to connect with patients for Virtual Visits (Telemedicine).  Patients are able to view lab/test results, encounter notes, upcoming appointments, etc.  Non-urgent messages can be sent to your provider as well.    To learn more about what you can do with MyChart, go to [email or web address].   Other Instructions ZIO XT- Long Term Monitor Instructions  Your physician has requested you wear a ZIO patch monitor for 14 days.   This is a single patch monitor. [employer] supplies one patch monitor per enrollment. Additional  stickers are not available. Please do not apply patch if you will be having a Nuclear Stress Test,  Echocardiogram, Cardiac CT, MRI, or Chest Xray during the period you  would be wearing the  monitor. The patch cannot be worn during these tests. You cannot remove and re-apply the  ZIO XT patch monitor.   Your ZIO patch monitor will be mailed 3 day USPS to your address on file. It may take 3-5 days  to receive your monitor after you have been enrolled.   Once you have received your monitor, please review the enclosed instructions. Your monitor  has already been registered assigning a specific monitor serial # to you.     Billing and Patient Assistance Program Information  We have supplied [employer] with any of your insurance information on file for billing purposes.  [employer] offers a sliding scale Patient Assistance Program for patients that do not have  insurance, or whose insurance does not completely cover the cost of the ZIO monitor.  You must apply for the Patient Assistance Program to qualify for this discounted rate.   To apply, please call [employer] at [phone], select option 4, select option 2, ask to apply for  Patient Assistance Program. Meredeth will ask your household income, and how many people  are in your household. They will quote your out-of-pocket cost based on that information.  [employer] will also be able to set up a 81-month, interest-free payment plan if needed.     Applying the monitor  Shave hair from upper left chest.  Hold abrader disc by orange tab. Rub abrader in 40 strokes over the upper left chest as  indicated in your monitor instructions.  Clean area with 4 enclosed alcohol  pads. Let dry.  Apply patch as indicated in monitor instructions. Patch will be placed under collarbone on left  side of chest with arrow pointing upward.  Rub patch adhesive wings for 2 minutes. Remove white label marked 1. Remove the white  label marked 2. Rub patch adhesive wings for 2 additional minutes.  While looking in a mirror, press and release button in center of patch. A small green light will  flash 3-4 times. This will be your only  indicator that the monitor has been turned on.  Do not shower for the first 24 hours. You may shower after the first 24 hours.  Press the button if you feel a symptom. You will hear a small click. Record Date, Time and  Symptom in the Patient Logbook.  When you are ready to remove the patch, follow instructions on the last 2 pages of Patient  Logbook. Stick patch monitor onto the last page of Patient Logbook.   Place Patient Logbook in the blue and white box. Use locking tab on box and tape box closed  securely. The blue and white box has prepaid postage on it. Please place it in the mailbox as  soon as possible. Your physician should have your test results approximately 7 days after the  monitor has been mailed back to [facility].   Call [facility] Customer Care at [phone] if you have questions regarding  your ZIO XT patch monitor. Call them immediately if you see an orange light blinking on your  monitor.   If your monitor falls off in less than 4 days, contact our Monitor department at [phone].   If your monitor becomes loose or falls off after 4 days call [employer] at [phone] for  suggestions on securing your monitor.

## 2024-07-22 ENCOUNTER — Ambulatory Visit: Admitting: Physician Assistant

## 2024-07-22 DIAGNOSIS — M810 Age-related osteoporosis without current pathological fracture: Secondary | ICD-10-CM | POA: Diagnosis not present

## 2024-07-22 MED ORDER — ROMOSOZUMAB-AQQG 105 MG/1.17ML ~~LOC~~ SOSY
210.0000 mg | PREFILLED_SYRINGE | SUBCUTANEOUS | Status: AC
  Administered 2024-07-22: 210 mg via SUBCUTANEOUS

## 2024-07-22 NOTE — Progress Notes (Signed)
 Office Visit Note   Patient: Denise Payne           Date of Birth: 1945-12-28           MRN: [id number] Visit Date: 07/22/2024              Requested by: Kennyth Worth HERO, MD [address] [place],  [place] [address] PCP: Kennyth Worth HERO, MD  Chief Complaint  Patient presents with   Osteoporosis      HPI: Patient is a pleasant 78 year old woman comes in for her seventh Evenity  injection.  She has had no adverse effects.  She had recent lab work which demonstrates her vitamin D  and calcium  to be within normal limits.  She denies any history of muscle cramps spasms twitching paresthesias in her fingers or toes.  She has been compliant with making sure she is getting enough vitamin D  and calcium   Assessment & Plan: Visit Diagnoses:  1. Age-related osteoporosis without current pathological fracture     Plan: Injections were administered in split dose today subcutaneously she tolerated the procedure well will follow-up for eighth injections in 1 month and 1 day  [facility] [id number] Lot [id number]  Follow-Up Instructions: 1 month 1 day  Ortho Exam  Patient is alert, oriented, no adenopathy, well-dressed, normal affect, normal respiratory effort.     Imaging: No results found. No images are attached to the encounter.  Labs: Lab Results  Component Value Date   HGBA1C 5.1 03/31/2024   HGBA1C 5.2 12/27/2021   HGBA1C 5.1 05/20/2019   ESRSEDRATE 10 02/05/2023   CRP <1.0 02/05/2023   REPTSTATUS 12/11/2020 FINAL 12/06/2020   CULT  12/06/2020    NO GROWTH 5 DAYS Performed at [facility], [address] [place], [place] [address]      Lab Results  Component Value Date   ALBUMIN 4.0 02/05/2023   ALBUMIN 3.6 01/25/2022   ALBUMIN 4.2 12/27/2021   PREALBUMIN 5.9 (L) 02/04/2019    Lab Results  Component Value Date   MG 1.8 01/25/2022   MG 1.4 (L) 02/19/2019   MG 2.3 02/07/2019   Lab Results  Component Value Date   VD25OH 81 06/19/2024   VD25OH  87 07/23/2023    Lab Results  Component Value Date   PREALBUMIN 5.9 (L) 02/04/2019      Latest Ref Rng & Units 03/31/2024   12:05 PM 02/05/2023   11:27 AM 04/24/2022    2:27 PM  CBC EXTENDED  WBC 3.8 - 10.8 Thousand/uL 5.8  6.7  6.8   RBC 3.80 - 5.10 Million/uL 4.39  4.03  4.17   Hemoglobin 11.7 - 15.5 g/dL 87.1  87.9  88.3   HCT 35.0 - 45.0 % 39.7  35.2  36.1   Platelets 140 - 400 Thousand/uL 51  222.0  277   NEUT# 1.4 - 7.7 K/uL  4.4  4.6   Lymph# 0.7 - 4.0 K/uL  1.6  1.4      There is no height or weight on file to calculate BMI.  Orders:  No orders of the defined types were placed in this encounter.  No orders of the defined types were placed in this encounter.    Procedures: No procedures performed  Clinical Data: No additional findings.  ROS:  All other systems negative, except as noted in the HPI. Review of Systems  Objective: Vital Signs: There were no vitals taken for this visit.  Specialty Comments:  No specialty comments available.  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   Atherosclerosis of aorta 12/15/2020   Benign essential HTN 04/12/2020   GERD (gastroesophageal reflux disease) 03/12/2020   Osteoporosis 03/12/2020   Asthma 01/26/2019   Sjogren's syndrome 01/26/2019   Past Medical History:  Diagnosis Date   Anemia    Asthma    Atrial fibrillation (HCC)    Cataract    Gallstones    GERD (gastroesophageal reflux disease)    High blood pressure    Osteoporosis    Sjogren's syndrome 01/26/2019    Family History  Problem Relation  Age of Onset   Breast cancer Mother 22   Hearing loss Mother    Heart disease Mother    High Cholesterol Mother    Hypertension Mother    Cancer Father    Hearing loss Father    Stroke Father    Transient ischemic attack Father    Arthritis Maternal Grandmother    Cancer Maternal Grandmother    Early death Maternal Grandmother    Diabetes Maternal Grandfather    Early death Maternal Grandfather    Hearing loss Maternal Grandfather    Heart attack Maternal Grandfather    Heart disease Maternal Grandfather    Arthritis Paternal Grandmother    Early death Paternal Grandmother    Hearing loss Paternal Grandmother    Hyperlipidemia Paternal Grandmother    Early death Paternal Grandfather    Kidney disease Paternal Grandfather    Cancer Sister    Hyperlipidemia Sister    Hypertension Sister    Birth defects Brother    Cancer Brother    Hyperlipidemia Brother     Past Surgical History:  Procedure Laterality Date   A-FLUTTER ABLATION N/A 05/17/2022   Procedure: A-FLUTTER ABLATION;  Surgeon: Waddell Danelle ORN, MD;  Location: [facility];  Service: Cardiovascular;  Laterality: N/A;   ABDOMINAL HYSTERECTOMY  1996   ANKLE FRACTURE SURGERY  2005   AUGMENTATION MAMMAPLASTY Bilateral 10/16/1976   CHOLECYSTECTOMY N/A 12/07/2020   Procedure: LAPAROSCOPIC CHOLECYSTECTOMY WITH INTRAOPERATIVE CHOLANGIOGRAM;  Surgeon: Stevie Herlene Righter, MD;  Location: [facility] OR;  Service: General;  Laterality: N/A;   CYSTOSCOPY WITH STENT PLACEMENT N/A 05/22/2019   Procedure: CYSTOSCOPY WITH STENT PLACEMENT;  Surgeon: Watt Rush, MD;  Location: [facility];  Service: Urology;  Laterality: N/A;   FLEXIBLE SIGMOIDOSCOPY N/A 05/22/2019   Procedure: FLEXIBLE SIGMOIDOSCOPY;  Surgeon: Teresa Lonni HERO, MD;  Location: [facility];  Service: General;  Laterality: N/A;   XI ROBOTIC ASSISTED LOWER ANTERIOR RESECTION Bilateral 05/22/2019   Procedure: XI ROBOTIC ASSISTED SIGMOIDECTOMY;  Surgeon: Teresa Lonni HERO, MD;   Location: [facility];  Service: General;  Laterality: Bilateral;   Social History   Occupational History   Occupation: retired  Tobacco Use   Smoking status: Never   Smokeless tobacco: Never  Vaping Use   Vaping status: Never Used  Substance and Sexual Activity   Alcohol  use: Yes    Alcohol /week: 8.0 standard drinks of alcohol     Types: 8 Glasses of wine per week    Comment: 3 times daily   Drug use: Never   Sexual activity: Not Currently

## 2024-07-22 NOTE — Progress Notes (Signed)
 SQ injection in left and right arms. Patient tolerated well.

## 2024-07-31 ENCOUNTER — Other Ambulatory Visit: Payer: Self-pay | Admitting: Family Medicine

## 2024-08-04 ENCOUNTER — Encounter: Payer: Self-pay | Admitting: Family Medicine

## 2024-08-05 NOTE — Telephone Encounter (Signed)
**Note De-identified  [place] Obfuscation** Please advise 

## 2024-08-05 NOTE — Telephone Encounter (Signed)
 I think it is reasonable to try this approach.  We can send in metformin  500 mg daily if she needs this though I would like for her to follow-up with us  in a few weeks to let us  know how this new regimen is working for her.

## 2024-08-15 ENCOUNTER — Other Ambulatory Visit: Payer: Self-pay | Admitting: Family Medicine

## 2024-08-18 ENCOUNTER — Encounter: Payer: Self-pay | Admitting: Radiology

## 2024-08-18 DIAGNOSIS — I4891 Unspecified atrial fibrillation: Secondary | ICD-10-CM | POA: Diagnosis not present

## 2024-08-19 ENCOUNTER — Ambulatory Visit: Payer: Self-pay | Admitting: Internal Medicine

## 2024-08-19 DIAGNOSIS — I4891 Unspecified atrial fibrillation: Secondary | ICD-10-CM

## 2024-08-22 ENCOUNTER — Ambulatory Visit: Admitting: Physician Assistant

## 2024-08-22 DIAGNOSIS — M8000XD Age-related osteoporosis with current pathological fracture, unspecified site, subsequent encounter for fracture with routine healing: Secondary | ICD-10-CM | POA: Diagnosis not present

## 2024-08-22 MED ORDER — ROMOSOZUMAB-AQQG 105 MG/1.17ML ~~LOC~~ SOSY: 210.0000 mg | PREFILLED_SYRINGE | SUBCUTANEOUS | Status: AC

## 2024-08-22 NOTE — Progress Notes (Signed)
 Patient here today for her Evenity  injections. Bilateral SubQ injections in the arms. Patient tolerated well. States she is doing well on the injections.

## 2024-08-22 NOTE — Progress Notes (Signed)
 Office Visit Note   Patient: Denise Payne           Date of Birth: 05-09-1946           MRN: [id number] Visit Date: 08/22/2024              Requested by: Kennyth Worth HERO, MD [address] [place],  [place] [address] PCP: Kennyth Worth HERO, MD  No chief complaint on file.     HPI: Patient is a 78 year old woman comes in for a Evenity  injection today she is doing well no complaints  Assessment & Plan: Visit Diagnoses:  1. Age-related osteoporosis with current pathological fracture with routine healing, subsequent encounter     Plan: Injection given without difficulty will follow-up in 31 days  Follow-Up Instructions: No follow-ups on file.   Ortho Exam  Patient is alert, oriented, no adenopathy, well-dressed, normal affect, normal respiratory effort.     Imaging: No results found. No images are attached to the encounter.  Labs: Lab Results  Component Value Date   HGBA1C 5.1 03/31/2024   HGBA1C 5.2 12/27/2021   HGBA1C 5.1 05/20/2019   ESRSEDRATE 10 02/05/2023   CRP <1.0 02/05/2023   REPTSTATUS 12/11/2020 FINAL 12/06/2020   CULT  12/06/2020    NO GROWTH 5 DAYS Performed at [facility], [address] [place], [place] [address]      Lab Results  Component Value Date   ALBUMIN 4.0 02/05/2023   ALBUMIN 3.6 01/25/2022   ALBUMIN 4.2 12/27/2021   PREALBUMIN 5.9 (L) 02/04/2019    Lab Results  Component Value Date   MG 1.8 01/25/2022   MG 1.4 (L) 02/19/2019   MG 2.3 02/07/2019   Lab Results  Component Value Date   VD25OH 81 06/19/2024   VD25OH 87 07/23/2023    Lab Results  Component Value Date   PREALBUMIN 5.9 (L) 02/04/2019      Latest Ref Rng & Units 03/31/2024   12:05 PM 02/05/2023   11:27 AM 04/24/2022    2:27 PM  CBC EXTENDED  WBC 3.8 - 10.8 Thousand/uL 5.8  6.7  6.8   RBC 3.80 - 5.10 Million/uL 4.39  4.03  4.17   Hemoglobin 11.7 - 15.5 g/dL 87.1  87.9  88.3   HCT 35.0 - 45.0 % 39.7  35.2  36.1   Platelets 140 - 400 Thousand/uL 51   222.0  277   NEUT# 1.4 - 7.7 K/uL  4.4  4.6   Lymph# 0.7 - 4.0 K/uL  1.6  1.4      There is no height or weight on file to calculate BMI.  Orders:  No orders of the defined types were placed in this encounter.  No orders of the defined types were placed in this encounter.    Procedures: No procedures performed  Clinical Data: No additional findings.  ROS:  All other systems negative, except as noted in the HPI. Review of Systems  Objective: Vital Signs: There were no vitals taken for this visit.  Specialty Comments:  No specialty comments available.  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   Atherosclerosis of aorta 12/15/2020  Benign essential HTN 04/12/2020   GERD (gastroesophageal reflux disease) 03/12/2020   Osteoporosis 03/12/2020   Asthma 01/26/2019   Sjogren's syndrome 01/26/2019   Past Medical History:  Diagnosis Date   Anemia    Asthma    Atrial fibrillation (HCC)    Cataract    Gallstones    GERD (gastroesophageal reflux disease)    High blood pressure    Osteoporosis    Sjogren's syndrome 01/26/2019    Family History  Problem Relation Age of Onset   Breast cancer Mother 5   Hearing loss Mother    Heart disease Mother    High Cholesterol Mother    Hypertension Mother    Cancer Father    Hearing loss Father    Stroke Father    Transient ischemic attack Father    Arthritis Maternal Grandmother    Cancer Maternal Grandmother    Early death Maternal Grandmother    Diabetes Maternal Grandfather    Early death Maternal Grandfather     Hearing loss Maternal Grandfather    Heart attack Maternal Grandfather    Heart disease Maternal Grandfather    Arthritis Paternal Grandmother    Early death Paternal Grandmother    Hearing loss Paternal Grandmother    Hyperlipidemia Paternal Grandmother    Early death Paternal Grandfather    Kidney disease Paternal Grandfather    Cancer Sister    Hyperlipidemia Sister    Hypertension Sister    Birth defects Brother    Cancer Brother    Hyperlipidemia Brother     Past Surgical History:  Procedure Laterality Date   A-FLUTTER ABLATION N/A 05/17/2022   Procedure: A-FLUTTER ABLATION;  Surgeon: Waddell Danelle ORN, MD;  [facility];  Service: Cardiovascular;  Laterality: N/A;   ABDOMINAL HYSTERECTOMY  1996   ANKLE FRACTURE SURGERY  2005   AUGMENTATION MAMMAPLASTY Bilateral 10/16/1976   CHOLECYSTECTOMY N/A 12/07/2020   Procedure: LAPAROSCOPIC CHOLECYSTECTOMY WITH INTRAOPERATIVE CHOLANGIOGRAM;  Surgeon: Stevie Herlene Righter, MD;  Location: [facility];  Service: General;  Laterality: N/A;   CYSTOSCOPY WITH STENT PLACEMENT N/A 05/22/2019   Procedure: CYSTOSCOPY WITH STENT PLACEMENT;  Surgeon: Watt Rush, MD;  Location: [facility];  Service: Urology;  Laterality: N/A;   FLEXIBLE SIGMOIDOSCOPY N/A 05/22/2019   Procedure: FLEXIBLE SIGMOIDOSCOPY;  Surgeon: Teresa Lonni HERO, MD;  Location: [facility];  Service: General;  Laterality: N/A;   XI ROBOTIC ASSISTED LOWER ANTERIOR RESECTION Bilateral 05/22/2019   Procedure: XI ROBOTIC ASSISTED SIGMOIDECTOMY;  Surgeon: Teresa Lonni HERO, MD;  Location: [facility];  Service: General;  Laterality: Bilateral;   Social History   Occupational History   Occupation: retired  Tobacco Use   Smoking status: Never   Smokeless tobacco: Never  Vaping Use   Vaping status: Never Used  Substance and Sexual Activity   Alcohol  use: Yes    Alcohol /week: 8.0 standard drinks of alcohol     Types: 8 Glasses of wine per week    Comment: 3 times daily   Drug use: Never    Sexual activity: Not Currently

## 2024-09-05 DIAGNOSIS — Z79899 Other long term (current) drug therapy: Secondary | ICD-10-CM | POA: Diagnosis not present

## 2024-09-17 DIAGNOSIS — Z6822 Body mass index (BMI) 22.0-22.9, adult: Secondary | ICD-10-CM | POA: Diagnosis not present

## 2024-09-17 DIAGNOSIS — M3501 Sicca syndrome with keratoconjunctivitis: Secondary | ICD-10-CM | POA: Diagnosis not present

## 2024-09-17 DIAGNOSIS — R5383 Other fatigue: Secondary | ICD-10-CM | POA: Diagnosis not present

## 2024-09-17 DIAGNOSIS — M25561 Pain in right knee: Secondary | ICD-10-CM | POA: Diagnosis not present

## 2024-09-17 DIAGNOSIS — R600 Localized edema: Secondary | ICD-10-CM | POA: Diagnosis not present

## 2024-09-17 DIAGNOSIS — M1991 Primary osteoarthritis, unspecified site: Secondary | ICD-10-CM | POA: Diagnosis not present

## 2024-09-17 DIAGNOSIS — M810 Age-related osteoporosis without current pathological fracture: Secondary | ICD-10-CM | POA: Diagnosis not present

## 2024-09-22 ENCOUNTER — Ambulatory Visit: Admitting: Physician Assistant

## 2024-09-22 DIAGNOSIS — M8000XD Age-related osteoporosis with current pathological fracture, unspecified site, subsequent encounter for fracture with routine healing: Secondary | ICD-10-CM | POA: Diagnosis not present

## 2024-09-22 MED ORDER — ROMOSOZUMAB-AQQG 105 MG/1.17ML ~~LOC~~ SOSY
210.0000 mg | PREFILLED_SYRINGE | SUBCUTANEOUS | Status: AC
  Administered 2024-09-22: 210 mg via SUBCUTANEOUS

## 2024-09-22 NOTE — Progress Notes (Signed)
 Office Visit Note   Patient: Denise Payne           Date of Birth: 1946/02/01           MRN: [id number] Visit Date: 09/22/2024              Requested by: Kennyth Worth HERO, MD [address] [place],  [place] [address] PCP: Kennyth Worth HERO, MD  Chief Complaint  Patient presents with   EVENITY  INJECTION      HPI: Patient is a pleasant 78 year old woman comes in for her ninth Evenity  injection has tolerated the previous in the past  Assessment & Plan: Visit Diagnoses:  1. Age-related osteoporosis with current pathological fracture with routine healing, subsequent encounter     Plan: Injection completed without difficulty follow-up in 31 days  Follow-Up Instructions: 31 days  Ortho Exam  Patient is alert, oriented, no adenopathy, well-dressed, normal affect, normal respiratory effort.     Imaging: No results found. No images are attached to the encounter.  Labs: Lab Results  Component Value Date   HGBA1C 5.1 03/31/2024   HGBA1C 5.2 12/27/2021   HGBA1C 5.1 05/20/2019   ESRSEDRATE 10 02/05/2023   CRP <1.0 02/05/2023   REPTSTATUS 12/11/2020 FINAL 12/06/2020   CULT  12/06/2020    NO GROWTH 5 DAYS Performed at [facility], [address], [place], [place] [address]      Lab Results  Component Value Date   ALBUMIN 4.0 02/05/2023   ALBUMIN 3.6 01/25/2022   ALBUMIN 4.2 12/27/2021   PREALBUMIN 5.9 (L) 02/04/2019    Lab Results  Component Value Date   MG 1.8 01/25/2022   MG 1.4 (L) 02/19/2019   MG 2.3 02/07/2019   Lab Results  Component Value Date   VD25OH 81 06/19/2024   VD25OH 87 07/23/2023    Lab Results  Component Value Date   PREALBUMIN 5.9 (L) 02/04/2019      Latest Ref Rng & Units 03/31/2024   12:05 PM 02/05/2023   11:27 AM 04/24/2022    2:27 PM  CBC EXTENDED  WBC 3.8 - 10.8 Thousand/uL 5.8  6.7  6.8   RBC 3.80 - 5.10 Million/uL 4.39  4.03  4.17   Hemoglobin 11.7 - 15.5 g/dL 87.1  87.9  88.3   HCT 35.0 - 45.0 % 39.7  35.2  36.1    Platelets 140 - 400 Thousand/uL 51  222.0  277   NEUT# 1.4 - 7.7 K/uL  4.4  4.6   Lymph# 0.7 - 4.0 K/uL  1.6  1.4      There is no height or weight on file to calculate BMI.  Orders:  No orders of the defined types were placed in this encounter.  Meds ordered this encounter  Medications   Romosozumab -aqqg (EVENITY ) 105 MG/1. injection 210 mg    Restricted to Outpatient Use.  Do Not Tube.     Procedures: No procedures performed  Clinical Data: No additional findings.  ROS:  All other systems negative, except as noted in the HPI. Review of Systems  Objective: Vital Signs: There were no vitals taken for this visit.  Specialty Comments:  No specialty comments available.  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   Atherosclerosis of aorta 12/15/2020   Benign essential HTN 04/12/2020   GERD (gastroesophageal reflux disease) 03/12/2020   Osteoporosis 03/12/2020   Asthma 01/26/2019   Sjogren's syndrome 01/26/2019   Past Medical History:  Diagnosis Date   Anemia    Asthma    Atrial fibrillation (HCC)    Cataract    Gallstones    GERD (gastroesophageal reflux disease)    High blood pressure    Osteoporosis    Sjogren's syndrome 01/26/2019    Family History  Problem Relation Age of Onset   Breast cancer Mother 65   Hearing loss Mother    Heart disease Mother    High Cholesterol Mother    Hypertension Mother    Cancer Father    Hearing loss Father    Stroke Father    Transient ischemic attack Father    Arthritis Maternal Grandmother    Cancer  Maternal Grandmother    Early death Maternal Grandmother    Diabetes Maternal Grandfather    Early death Maternal Grandfather    Hearing loss Maternal Grandfather    Heart attack Maternal Grandfather    Heart disease Maternal Grandfather    Arthritis Paternal Grandmother    Early death Paternal Grandmother    Hearing loss Paternal Grandmother    Hyperlipidemia Paternal Grandmother    Early death Paternal Grandfather    Kidney disease Paternal Grandfather    Cancer Sister    Hyperlipidemia Sister    Hypertension Sister    Birth defects Brother    Cancer Brother    Hyperlipidemia Brother     Past Surgical History:  Procedure Laterality Date   A-FLUTTER ABLATION N/A 05/17/2022   Procedure: A-FLUTTER ABLATION;  Surgeon: Waddell Danelle ORN, MD;  [facility];  Service: Cardiovascular;  Laterality: N/A;   ABDOMINAL HYSTERECTOMY  1996   ANKLE FRACTURE SURGERY  2005   AUGMENTATION MAMMAPLASTY Bilateral 10/16/1976   CHOLECYSTECTOMY N/A 12/07/2020   Procedure: LAPAROSCOPIC CHOLECYSTECTOMY WITH INTRAOPERATIVE CHOLANGIOGRAM;  Surgeon: Stevie Herlene Righter, MD;  Location: [facility];  Service: General;  Laterality: N/A;   CYSTOSCOPY WITH STENT PLACEMENT N/A 05/22/2019   Procedure: CYSTOSCOPY WITH STENT PLACEMENT;  Surgeon: Watt Rush, MD;  Location[facility];  Service: Urology;  Laterality: N/A;   FLEXIBLE SIGMOIDOSCOPY N/A 05/22/2019   Procedure: FLEXIBLE SIGMOIDOSCOPY;  Surgeon: Teresa Lonni HERO, MD;  Location[facility];  Service: General;  Laterality: N/A;   XI ROBOTIC ASSISTED LOWER ANTERIOR RESECTION Bilateral 05/22/2019   Procedure: XI ROBOTIC ASSISTED SIGMOIDECTOMY;  Surgeon: Teresa Lonni HERO, MD;  Location: [facility];  Service: General;  Laterality: Bilateral;   Social History   Occupational History   Occupation: retired  Tobacco Use   Smoking status: Never   Smokeless tobacco: Never  Vaping Use   Vaping status: Never Used  Substance and Sexual Activity   Alcohol  use: Yes     Alcohol /week: 8.0 standard drinks of alcohol     Types: 8 Glasses of wine per week    Comment: 3 times daily   Drug use: Never   Sexual activity: Not Currently

## 2024-09-23 ENCOUNTER — Ambulatory Visit: Admitting: Physician Assistant

## 2024-09-30 ENCOUNTER — Ambulatory Visit: Admitting: Family Medicine

## 2024-10-02 ENCOUNTER — Encounter: Payer: Self-pay | Admitting: Family Medicine

## 2024-10-02 ENCOUNTER — Other Ambulatory Visit: Payer: Self-pay | Admitting: Family Medicine

## 2024-10-02 ENCOUNTER — Ambulatory Visit: Admitting: Family Medicine

## 2024-10-02 VITALS — BP 118/60 | HR 58 | Temp 97.4°F | Ht 62.0 in | Wt 121.2 lb

## 2024-10-02 DIAGNOSIS — H6990 Unspecified Eustachian tube disorder, unspecified ear: Secondary | ICD-10-CM | POA: Insufficient documentation

## 2024-10-02 DIAGNOSIS — I10 Essential (primary) hypertension: Secondary | ICD-10-CM | POA: Diagnosis not present

## 2024-10-02 DIAGNOSIS — E663 Overweight: Secondary | ICD-10-CM | POA: Diagnosis not present

## 2024-10-02 DIAGNOSIS — M542 Cervicalgia: Secondary | ICD-10-CM

## 2024-10-02 MED ORDER — PREDNISONE 20 MG PO TABS: 20.0000 mg | ORAL_TABLET | Freq: Every day | ORAL | 0 refills | Status: DC

## 2024-10-02 MED ORDER — METFORMIN HCL 500 MG PO TABS: 500.0000 mg | ORAL_TABLET | Freq: Every day | ORAL | 3 refills | Status: AC

## 2024-10-02 MED ORDER — METFORMIN HCL 500 MG PO TABS: 500.0000 mg | ORAL_TABLET | Freq: Every day | ORAL | 0 refills | Status: DC

## 2024-10-02 NOTE — Assessment & Plan Note (Signed)
 Insurance will no longer cover Wegovy  at the beginning of the new year.  She has been on metformin  500 mg daily and has been tolerating well for the last few weeks.  She will continue this after discontinuing Wegovy  in a few weeks.  She can follow-up with us  in a few weeks via MyChart to let us  know how she is doing with metformin  alone.  Weight is at goal today.

## 2024-10-02 NOTE — Assessment & Plan Note (Signed)
 Tympanostomy tube in place in the left ear however looks like it may be starting to extrude.  She is having more muffled hearing in her left ear as well.  She will follow-up with ENT soon.

## 2024-10-02 NOTE — Progress Notes (Signed)
 Denise Payne is a 78 y.o. female who presents today for an office visit.  Assessment/Plan:  Chronic Problems Addressed Today: Benign essential HTN Blood pressure are at goal today on metoprolol  to tartrate 50 mg twice daily  Neck pain Patient with mild flareup of neck pain in her left neck.  She has had some improvement with Flexeril .  We discussed some exercise program and handout was given.  She cannot take NSAIDs due to being on Eliquis .  We did discuss prednisone .  She does not think that she needs this currently however we will send a Pap prescription that she can start if symptoms do not improve in the next few days.  She will let us  know if not proving the next week or so would consider referral to sports medicine or PT at that time.  Overweight Insurance will no longer cover Wegovy  at the beginning of the new year.  She has been on metformin  500 mg daily and has been tolerating well for the last few weeks.  She will continue this after discontinuing Wegovy  in a few weeks.  She can follow-up with us  in a few weeks via MyChart to let us  know how she is doing with metformin  alone.  Weight is at goal today.  Eustachian tube dysfunction Tympanostomy tube in place in the left ear however looks like it may be starting to extrude.  She is having more muffled hearing in her left ear as well.  She will follow-up with ENT soon.     Subjective:  HPI:  See assessment / plan for status of chronic conditions.   Discussed the use of AI scribe software for clinical note transcription with the patient, who gave verbal consent to proceed.  History of Present Illness Denise Payne is a 78 year old female who presents for a six-month follow-up.  Her weight remains stable while taking Wegovy  and metformin . She takes metformin  500 mg once daily at breakfast and requests a refill due to upcoming travel. She has three doses of Wegovy  remaining, which will last until the end of January.  She  experiences a sensation of fullness and blockage in her left ear, where a tube was placed a couple of years ago. The ear feels 'really, really itchy' and sometimes wakes her up at night. She recalls that when the tube was placed, a significant amount of dead skin was removed from the ear canal. She has a long history of ear problems since childhood, including frequent ear infections, mastoiditis, and burst eardrums. 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 Her hearing is deteriorating, and she reports being told by doctors that this is due to nerve damage. She experiences significant pain when flying due to pressure changes.  She recently wore a 14-day heart monitor, which showed a few bursts of supraventricular tachycardia (SVT) with a heart rate reaching 197 beats per minute. She did not feel any symptoms during these episodes and is concerned about not feeling a heart rate that high. She has a history of premature ventricular contractions (PVCs). No atrial fibrillation was noted during heart monitoring.  She experienced a sudden onset of neck pain yesterday, which worsened during breakfast. She took Flexeril  at bedtime, which helped her sleep as long as she was on her right side. She uses a neck pillow but is unsure what triggered the pain. She takes arthritis-strength Tylenol  650 mg at bedtime and has a history of nerve issues in her neck, which prevent  her from looking down to the left without discomfort.  She travels frequently, with recent trips to [place] , [place] , and upcoming travel to [place]  and [place] . She is planning to travel to [place] and may need a typhoid vaccine.         Objective:  Physical Exam: BP 118/60   Pulse (!) 58   Temp (!) 97.4 F (36.3 C) (Temporal)   Ht 5' 2 (1.575 m)   Wt 121 lb 3.2 oz (55 kg) Comment: this morning at home, patient reported  SpO2 96%   BMI 22.17 kg/m   Wt Readings from Last 3 Encounters:  10/02/24  121 lb 3.2 oz (55 kg)  07/18/24 123 lb 12.8 oz (56.2 kg)  06/03/24 125 lb 3.2 oz (56.8 kg)    Gen: No acute distress, resting comfortably HEENT: Right TM clear.  Left TM with tympanostomy tube in place directed anteriorly and inferiorly CV: Regular rate and rhythm with no murmurs appreciated Pulm: Normal work of breathing, clear to auscultation bilaterally with no crackles, wheezes, or rhonchi Neuro: Grossly normal, moves all extremities Psych: Normal affect and thought content      Denise Stenseth M. Kennyth, MD 10/02/2024 11:07 AM

## 2024-10-02 NOTE — Assessment & Plan Note (Signed)
 Patient with mild flareup of neck pain in her left neck.  She has had some improvement with Flexeril .  We discussed some exercise program and handout was given.  She cannot take NSAIDs due to being on Eliquis .  We did discuss prednisone .  She does not think that she needs this currently however we will send a Pap prescription that she can start if symptoms do not improve in the next few days.  She will let us  know if not proving the next week or so would consider referral to sports medicine or PT at that time.

## 2024-10-02 NOTE — Assessment & Plan Note (Signed)
 Blood pressure are at goal today on metoprolol  to tartrate 50 mg twice daily

## 2024-10-02 NOTE — Patient Instructions (Signed)
 It was very nice to see you today!  VISIT SUMMARY: Today, we discussed your stable weight on Wegovy  and metformin , your recent neck pain, ear issues, and heart monitoring results. We also talked about your upcoming travels and potential need for a typhoid vaccine.  YOUR PLAN: OVERWEIGHT AND METABOLIC SYNDROME: Your weight remains stable while transitioning from Wegovy  to metformin  alone. -A 30-day supply of metformin  was sent to [facility], and a long-term prescription to [facility]. -Please report your progress off Wegovy  in a few weeks.  CERVICALGIA (NECK PAIN): You have acute neck pain, likely muscular, with relief from Flexeril . -Prednisone  is prescribed for potential use if symptoms worsen. -Continue using neck stretches and exercises provided.  EUSTACHIAN TUBE DYSFUNCTION WITH TYMPANOSTOMY TUBE: You have ear blockage and itchiness, possibly due to the tube shifting, with a history of ear issues and nerve damage. -An ENT follow-up is recommended for tympanostomy tube evaluation.  PAROXYSMAL SUPRAVENTRICULAR TACHYCARDIA (SVT): Your heart monitor showed brief SVT episodes with a maximum of 197 bpm, no atrial fibrillation, and a structurally normal heart.  GENERAL HEALTH MAINTENANCE: Your vaccines are up to date. -Check if you need a typhoid vaccination for your upcoming travel to [place].  Return in about 6 months (around 04/02/2025) for Annual Physical.   Take care, Dr Kennyth  PLEASE NOTE:  If you had any lab tests, please let us  know if you have not heard back within a few days. You may see your results on mychart before we have a chance to review them but we will give you a call once they are reviewed by us .   If we ordered any referrals today, please let us  know if you have not heard from their office within the next week.   If you had any urgent prescriptions sent in today, please check with the pharmacy within an hour of our visit to make sure the prescription was  transmitted appropriately.   Please try these tips to maintain a healthy lifestyle:  Eat at least 3 REAL meals and 1-2 snacks per day.  Aim for no more than 5 hours between eating.  If you eat breakfast, please do so within one hour of getting up.   Each meal should contain half fruits/vegetables, one quarter protein, and one quarter carbs (no bigger than a computer mouse)  Cut down on sweet beverages. This includes juice, soda, and sweet tea.   Drink at least 1 glass of water with each meal and aim for at least 8 glasses per day  Exercise at least 150 minutes every week.

## 2024-10-22 ENCOUNTER — Other Ambulatory Visit: Payer: Self-pay | Admitting: Internal Medicine

## 2024-10-22 DIAGNOSIS — I4891 Unspecified atrial fibrillation: Secondary | ICD-10-CM

## 2024-10-23 ENCOUNTER — Ambulatory Visit

## 2024-10-24 ENCOUNTER — Encounter: Payer: Self-pay | Admitting: Physician Assistant

## 2024-10-24 ENCOUNTER — Ambulatory Visit: Admitting: Physician Assistant

## 2024-10-24 DIAGNOSIS — M810 Age-related osteoporosis without current pathological fracture: Secondary | ICD-10-CM | POA: Diagnosis not present

## 2024-10-24 MED ORDER — ROMOSOZUMAB-AQQG 105 MG/1.17ML ~~LOC~~ SOSY
210.0000 mg | PREFILLED_SYRINGE | SUBCUTANEOUS | Status: AC
  Administered 2024-10-24: 210 mg via SUBCUTANEOUS

## 2024-10-24 NOTE — Progress Notes (Signed)
 "  Office Visit Note   Patient: Denise Payne           Date of Birth: May 12, 1946           MRN: [id number] Visit Date: 10/24/2024              Requested by: Kennyth Worth HERO, MD [address] [place],  [place] [address] PCP: Kennyth Worth HERO, MD  No chief complaint on file.     HPI: Patient presents today for her 10th Evenity  injection she is tolerated the previous well.  Assessment & Plan: Visit Diagnoses:  1. Age-related osteoporosis without current pathological fracture     Plan: Injection given without difficulty will follow-up in 31 days  Follow-Up Instructions: No follow-ups on file.   Ortho Exam  Patient is alert, oriented, no adenopathy, well-dressed, normal affect, normal respiratory effort.     Imaging: No results found. No images are attached to the encounter.  Labs: Lab Results  Component Value Date   HGBA1C 5.1 03/31/2024   HGBA1C 5.2 12/27/2021   HGBA1C 5.1 05/20/2019   ESRSEDRATE 10 02/05/2023   CRP <1.0 02/05/2023   REPTSTATUS 12/11/2020 FINAL 12/06/2020   CULT  12/06/2020    NO GROWTH 5 DAYS Performed at [facility], [address], [place], [place] [address]      Lab Results  Component Value Date   ALBUMIN 4.0 02/05/2023   ALBUMIN 3.6 01/25/2022   ALBUMIN 4.2 12/27/2021   PREALBUMIN 5.9 (L) 02/04/2019    Lab Results  Component Value Date   MG 1.8 01/25/2022   MG 1.4 (L) 02/19/2019   MG 2.3 02/07/2019   Lab Results  Component Value Date   VD25OH 81 06/19/2024   VD25OH 87 07/23/2023    Lab Results  Component Value Date   PREALBUMIN 5.9 (L) 02/04/2019      Latest Ref Rng & Units 03/31/2024   12:05 PM 02/05/2023   11:27 AM 04/24/2022    2:27 PM  CBC EXTENDED  WBC 3.8 - 10.8 Thousand/uL 5.8  6.7  6.8   RBC 3.80 - 5.10 Million/uL 4.39  4.03  4.17   Hemoglobin 11.7 - 15.5 g/dL 87.1  87.9  88.3   HCT 35.0 - 45.0 % 39.7  35.2  36.1   Platelets 140 - 400 Thousand/uL 51  222.0  277   NEUT# 1.4 - 7.7 K/uL  4.4  4.6    Lymph# 0.7 - 4.0 K/uL  1.6  1.4      There is no height or weight on file to calculate BMI.  Orders:  No orders of the defined types were placed in this encounter.  No orders of the defined types were placed in this encounter.    Procedures: No procedures performed  Clinical Data: No additional findings.  ROS:  All other systems negative, except as noted in the HPI. Review of Systems  Objective: Vital Signs: There were no vitals taken for this visit.  Specialty Comments:  No specialty comments available.  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   Atherosclerosis of aorta 12/15/2020   Benign  essential HTN 04/12/2020   GERD (gastroesophageal reflux disease) 03/12/2020   Osteoporosis 03/12/2020   Asthma 01/26/2019   Sjogren's syndrome 01/26/2019   Past Medical History:  Diagnosis Date   Anemia    Asthma    Atrial fibrillation (HCC)    Cataract    Gallstones    GERD (gastroesophageal reflux disease)    High blood pressure    Osteoporosis    Sjogren's syndrome 01/26/2019    Family History  Problem Relation Age of Onset   Breast cancer Mother 30   Hearing loss Mother    Heart disease Mother    High Cholesterol Mother    Hypertension Mother    Cancer Father    Hearing loss Father    Stroke Father    Transient ischemic attack Father    Arthritis Maternal Grandmother    Cancer Maternal Grandmother    Early death Maternal Grandmother    Diabetes Maternal Grandfather    Early death Maternal Grandfather     Hearing loss Maternal Grandfather    Heart attack Maternal Grandfather    Heart disease Maternal Grandfather    Arthritis Paternal Grandmother    Early death Paternal Grandmother    Hearing loss Paternal Grandmother    Hyperlipidemia Paternal Grandmother    Early death Paternal Grandfather    Kidney disease Paternal Grandfather    Cancer Sister    Hyperlipidemia Sister    Hypertension Sister    Birth defects Brother    Cancer Brother    Hyperlipidemia Brother     Past Surgical History:  Procedure Laterality Date   A-FLUTTER ABLATION N/A 05/17/2022   Procedure: A-FLUTTER ABLATION;  Surgeon: Waddell Danelle ORN, MD;  Location: [facility];  Service: Cardiovascular;  Laterality: N/A;   ABDOMINAL HYSTERECTOMY  1996   ANKLE FRACTURE SURGERY  2005   AUGMENTATION MAMMAPLASTY Bilateral 10/16/1976   CHOLECYSTECTOMY N/A 12/07/2020   Procedure: LAPAROSCOPIC CHOLECYSTECTOMY WITH INTRAOPERATIVE CHOLANGIOGRAM;  Surgeon: Stevie Herlene Righter, MD;  Location: [facility];  Service: General;  Laterality: N/A;   CYSTOSCOPY WITH STENT PLACEMENT N/A 05/22/2019   Procedure: CYSTOSCOPY WITH STENT PLACEMENT;  Surgeon: Watt Rush, MD;  Location: [facility];  Service: Urology;  Laterality: N/A;   FLEXIBLE SIGMOIDOSCOPY N/A 05/22/2019   Procedure: FLEXIBLE SIGMOIDOSCOPY;  Surgeon: Teresa Lonni HERO, MD;  Location: [facility];  Service: General;  Laterality: N/A;   XI ROBOTIC ASSISTED LOWER ANTERIOR RESECTION Bilateral 05/22/2019   Procedure: XI ROBOTIC ASSISTED SIGMOIDECTOMY;  Surgeon: Teresa Lonni HERO, MD;  Location: [facility];  Service: General;  Laterality: Bilateral;   Social History   Occupational History   Occupation: retired  Tobacco Use   Smoking status: Never   Smokeless tobacco: Never  Vaping Use   Vaping status: Never Used  Substance and Sexual Activity   Alcohol  use: Yes    Alcohol /week: 8.0 standard drinks of alcohol     Types: 8 Glasses of wine per week    Comment: 3 times daily   Drug use: Never    Sexual activity: Not Currently       "

## 2024-10-24 NOTE — Addendum Note (Signed)
 Addended by: JACKSON LEADER on: 10/24/2024 01:11 PM   Modules accepted: Orders

## 2024-10-27 ENCOUNTER — Ambulatory Visit

## 2024-10-27 VITALS — BP 122/68 | HR 72 | Temp 97.2°F | Ht 62.0 in | Wt 126.8 lb

## 2024-10-27 DIAGNOSIS — Z0000 Encounter for general adult medical examination without abnormal findings: Secondary | ICD-10-CM

## 2024-10-27 NOTE — Progress Notes (Signed)
 "  Chief Complaint  Patient presents with   Medicare Wellness     Subjective:   Denise Payne is a 79 y.o. female who presents for a Medicare Annual Wellness Visit.  Visit info / Clinical Intake: Medicare Wellness Visit Type:: Subsequent Annual Wellness Visit Persons participating in visit and providing information:: patient Medicare Wellness Visit Mode:: In-person (required for WTM) Interpreter Needed?: No Pre-visit prep was completed: yes AWV questionnaire completed by patient prior to visit?: no Living arrangements:: (!) lives alone Patient's Overall Health Status Rating: good Typical amount of pain: none Does pain affect daily life?: no Are you currently prescribed opioids?: no  Dietary Habits and Nutritional Risks How many meals a day?: 3 Eats fruit and vegetables daily?: yes Most meals are obtained by: preparing own meals In the last 2 weeks, have you had any of the following?: none Diabetic:: no  Functional Status Activities of Daily Living (to include ambulation/medication): Independent Ambulation: Independent with device- listed below Home Assistive Devices/Equipment: Eyeglasses; Other (Comment) (hearing aids) Medication Administration: Independent Home Management (perform basic housework or laundry): Independent Manage your own finances?: yes Primary transportation is: driving Concerns about vision?: no *vision screening is required for WTM* Concerns about hearing?: (!) yes Uses hearing aids?: (!) yes Hear whispered voice?: yes  Fall Screening Falls in the past year?: 1 Number of falls in past year: 1 Was there an injury with Fall?: 0 Fall Risk Category Calculator: 2 Patient Fall Risk Level: Moderate Fall Risk  Fall Risk Patient at Risk for Falls Due to: History of fall(s) Fall risk Follow up: Falls evaluation completed  Home and Transportation Safety: All rugs have non-skid backing?: yes All stairs or steps have railings?: yes Grab bars in the  bathtub or shower?: (!) no Have non-skid surface in bathtub or shower?: yes Good home lighting?: yes Regular seat belt use?: yes Hospital stays in the last year:: no  Cognitive Assessment Difficulty concentrating, remembering, or making decisions? : no Will 6CIT or Mini Cog be Completed: yes What year is it?: 0 points What month is it?: 0 points Give patient an address phrase to remember (5 components): [address] [place]  About what time is it?: 0 points Count backwards from 20 to 1: 0 points Say the months of the year in reverse: 0 points Repeat the address phrase from earlier: 0 points 6 CIT Score: 0 points  Advance Directives (For Healthcare) Does Patient Have a Medical Advance Directive?: Yes Type of Advance Directive: Healthcare Power of Attorney Copy of Healthcare Power of Attorney in Chart?: No - copy requested  Reviewed/Updated  Reviewed/Updated: Reviewed All (Medical, Surgical, Family, Medications, Allergies, Care Teams, Patient Goals)    Allergies (verified) Latex, Morphine , Meperidine hcl, and Onion   Current Medications (verified) Outpatient Encounter Medications as of 10/27/2024  Medication Sig   acetaminophen  (TYLENOL ) 500 MG tablet Take 1,000 mg by mouth every 6 (six) hours as needed for mild pain.   Biotin  10000 MCG TABS Take 10,000 mcg by mouth daily.   Cholecalciferol  (VITAMIN D ) 50 MCG (2000 UT) tablet Take 2,000 Units by mouth daily.   cyclobenzaprine  (FLEXERIL ) 10 MG tablet Take 1 tablet (10 mg total) by mouth 3 (three) times daily as needed for muscle spasms.   diclofenac sodium (VOLTAREN) 1 % GEL Apply 2 g topically 2 (two) times daily as needed (pain).    ELIQUIS  5 MG TABS tablet TAKE 1 TABLET TWICE A DAY   esomeprazole  (NEXIUM ) 40 MG capsule TAKE 1 CAPSULE DAILY  hydroxychloroquine  (PLAQUENIL ) 200 MG tablet Take 200 mg by mouth daily.   metFORMIN  (GLUCOPHAGE ) 500 MG tablet Take 1 tablet (500 mg total) by mouth daily with breakfast.   metoprolol   tartrate (LOPRESSOR ) 50 MG tablet TAKE 1 TABLET TWICE A DAY (DOSE CHANGE/ INCREASED)   montelukast  (SINGULAIR ) 10 MG tablet TAKE 1 TABLET AT BEDTIME   Probiotic Product (PROBIOTIC PO) Take 1 tablet by mouth daily.   Romosozumab -aqqg (EVENITY  Reynolds) Inject 1 Dose into the skin every 30 (thirty) days.   rosuvastatin  (CRESTOR ) 10 MG tablet TAKE 1 TABLET DAILY   Semaglutide -Weight Management (WEGOVY ) 0.5 MG/0.5ML SOAJ Inject 0.5 mg into the skin once a week.   vitamin B-12 (CYANOCOBALAMIN ) 1000 MCG tablet Take 1,000 mcg by mouth daily.    [DISCONTINUED] predniSONE  (DELTASONE ) 20 MG tablet Take 1 tablet (20 mg total) by mouth daily with breakfast.   No facility-administered encounter medications on file as of 10/27/2024.    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   Cataract    Gallstones    GERD (gastroesophageal reflux disease)    High blood pressure    Osteoporosis    Sjogren's syndrome 01/26/2019   Past Surgical History:  Procedure Laterality Date   A-FLUTTER ABLATION N/A 05/17/2022   Procedure: A-FLUTTER ABLATION;  Surgeon: Waddell Danelle ORN, MD;  Location: [facility];  Service: Cardiovascular;  Laterality: N/A;   ABDOMINAL HYSTERECTOMY  1996   ANKLE FRACTURE SURGERY  2005   AUGMENTATION MAMMAPLASTY Bilateral 10/16/1976   CHOLECYSTECTOMY N/A 12/07/2020   Procedure: LAPAROSCOPIC CHOLECYSTECTOMY WITH INTRAOPERATIVE CHOLANGIOGRAM;  Surgeon: Stevie Herlene Righter, MD;  Location: [facility];  Service: General;  Laterality: N/A;   CYSTOSCOPY WITH STENT PLACEMENT N/A 05/22/2019   Procedure: CYSTOSCOPY WITH STENT PLACEMENT;  Surgeon: Watt Rush, MD;  Location: [facility];  Service: Urology;  Laterality: N/A;   FLEXIBLE SIGMOIDOSCOPY N/A 05/22/2019   Procedure: FLEXIBLE SIGMOIDOSCOPY;  Surgeon: Teresa Lonni HERO, MD;  Location: [facility];  Service: General;  Laterality: N/A;   XI  ROBOTIC ASSISTED LOWER ANTERIOR RESECTION Bilateral 05/22/2019   Procedure: XI ROBOTIC ASSISTED SIGMOIDECTOMY;  Surgeon: Teresa Lonni HERO, MD;  Location: [facility];  Service: General;  Laterality: Bilateral;   Family History  Problem Relation Age of Onset   Breast cancer Mother 17   Hearing loss Mother    Heart disease Mother    High Cholesterol Mother    Hypertension Mother    Cancer Father    Hearing loss Father    Stroke Father    Transient ischemic attack Father    Arthritis Maternal Grandmother    Cancer Maternal Grandmother    Early death Maternal Grandmother    Heart disease Maternal Grandmother    Diabetes Maternal Grandfather    Early death Maternal Grandfather    Hearing loss Maternal Grandfather    Heart attack Maternal Grandfather    Heart disease Maternal Grandfather    Arthritis Paternal Grandmother    Early death Paternal Grandmother    Hearing loss Paternal Grandmother    Hyperlipidemia Paternal Grandmother    Heart attack Paternal Grandmother    Early death Paternal Grandfather    Kidney disease Paternal Grandfather    Cancer Sister    Hyperlipidemia Sister    Hypertension Sister    Birth defects Brother    Cancer Brother  Hyperlipidemia Brother    Social History   Occupational History   Occupation: retired  Tobacco Use   Smoking status: Never   Smokeless tobacco: Never  Vaping Use   Vaping status: Never Used  Substance and Sexual Activity   Alcohol  use: Yes    Alcohol /week: 8.0 standard drinks of alcohol     Types: 8 Glasses of wine per week    Comment: 3 times daily   Drug use: Never   Sexual activity: Not Currently   Tobacco Counseling Counseling given: Not Answered  SDOH Screenings   Food Insecurity: No Food Insecurity (10/27/2024)  Housing: Unknown (10/27/2024)  Transportation Needs: No Transportation Needs (10/27/2024)  Utilities: Not At Risk (10/27/2024)  Depression (PHQ2-9): Low Risk (10/27/2024)  Financial Resource Strain: Low Risk  (09/18/2023)  Physical Activity: Inactive (10/27/2024)  Social Connections: Moderately Integrated (10/27/2024)  Stress: No Stress Concern Present (10/27/2024)  Tobacco Use: Low Risk (10/27/2024)  Health Literacy: Adequate Health Literacy (10/27/2024)   See flowsheets for full screening details  Depression Screen PHQ 2 & 9 Depression Scale- Over the past 2 weeks, how often have you been bothered by any of the following problems? Little interest or pleasure in doing things: 0 Feeling down, depressed, or hopeless (PHQ Adolescent also includes...irritable): 0 PHQ-2 Total Score: 0     Goals Addressed               This Visit's Progress     maintain weight (pt-stated)        Maintain weight              Objective:    Today's Vitals   10/27/24 1046  BP: 122/68  Pulse: 72  Temp: (!) 97.2 F (36.2 C)  SpO2: 93%  Weight: 126 lb 12.8 oz (57.5 kg)  Height: 5' 2 (1.575 m)   Body mass index is 23.19 kg/m.  Hearing/Vision screen Hearing Screening - Comments:: Wears hearing aids  Vision Screening - Comments:: Wears rx glasses - up to date with routine eye exams with [facility] Dr Graham Immunizations and Health Maintenance Health Maintenance  Topic Date Due   COVID-19 Vaccine (8 - 2025-26 season) 06/16/2024   Medicare Annual Wellness (AWV)  10/27/2025   DTaP/Tdap/Td (2 - Td or Tdap) 04/25/2026   Pneumococcal Vaccine: 50+ Years  Completed   Influenza Vaccine  Completed   Bone Density Scan  Completed   Hepatitis C Screening  Completed   Zoster Vaccines- Shingrix  Completed   Meningococcal B Vaccine  Aged Out   Hepatitis B Vaccines 19-59 Average Risk  Discontinued   Mammogram  Discontinued        Assessment/Plan:  This is a routine wellness examination for Denise Payne.  Patient Care Team: Kennyth Worth HERO, MD as PCP - General (Family Medicine) Okey Vina GAILS, MD as PCP - Cardiology (Cardiology)  I have personally reviewed and noted the following in the patients chart:    Medical and social history Use of alcohol , tobacco or illicit drugs  Current medications and supplements including opioid prescriptions. Functional ability and status Nutritional status Physical activity Advanced directives List of other physicians Hospitalizations, surgeries, and ER visits in previous 12 months Vitals Screenings to include cognitive, depression, and falls Referrals and appointments  No orders of the defined types were placed in this encounter.  In addition, I have reviewed and discussed with patient certain preventive protocols, quality metrics, and best practice recommendations. A written personalized care plan for preventive services as well as general preventive health  recommendations were provided to patient.   Denise VEAR Haws, LPN   8/87/7973   Return in 53 weeks (on 11/02/2025).  After Visit Summary: (In Person-Printed) AVS printed and given to the patient  Nurse Notes: No voiced or noted concerns at this time  "

## 2024-10-27 NOTE — Patient Instructions (Signed)
 Ms. Ekstein,  Thank you for taking the time for your Medicare Wellness Visit. I appreciate your continued commitment to your health goals. Please review the care plan we discussed, and feel free to reach out if I can assist you further.  Please note that Annual Wellness Visits do not include a physical exam. Some assessments may be limited, especially if the visit was conducted virtually. If needed, we may recommend an in-person follow-up with your provider.  Ongoing Care Seeing your primary care provider every 3 to 6 months helps us  monitor your health and provide consistent, personalized care.   Referrals If a referral was made during today's visit and you haven't received any updates within two weeks, please contact the referred provider directly to check on the status.  Recommended Screenings:  Health Maintenance  Topic Date Due   COVID-19 Vaccine (8 - 2025-26 season) 06/16/2024   Medicare Annual Wellness Visit  10/27/2025   DTaP/Tdap/Td vaccine (2 - Td or Tdap) 04/25/2026   Pneumococcal Vaccine for age over 39  Completed   Flu Shot  Completed   Osteoporosis screening with Bone Density Scan  Completed   Hepatitis C Screening  Completed   Zoster (Shingles) Vaccine  Completed   Meningitis B Vaccine  Aged Out   Hepatitis B Vaccine  Discontinued   Breast Cancer Screening  Discontinued       09/18/2023   11:38 AM  Advanced Directives  Does Patient Have a Medical Advance Directive? Yes  Type of Estate Agent of [place];Living will  Copy of Healthcare Power of Attorney in Chart? No - copy requested    Vision: Annual vision screenings are recommended for early detection of glaucoma, cataracts, and diabetic retinopathy. These exams can also reveal signs of chronic conditions such as diabetes and high blood pressure.  Dental: Annual dental screenings help detect early signs of oral cancer, gum disease, and other conditions linked to overall health, including  heart disease and diabetes.  Please see the attached documents for additional preventive care recommendations.

## 2024-11-03 ENCOUNTER — Encounter: Payer: Self-pay | Admitting: Family Medicine

## 2024-11-03 NOTE — Telephone Encounter (Signed)
 Please advise.

## 2024-11-04 NOTE — Telephone Encounter (Signed)
 The equivalent would be 4 mg daily.  We can send in a prescription if she prefers this instead of the injectable version.

## 2024-11-12 ENCOUNTER — Other Ambulatory Visit: Payer: Self-pay | Admitting: *Deleted

## 2024-11-12 MED ORDER — WEGOVY 4 MG PO TABS: 4.0000 mg | ORAL_TABLET | Freq: Every day | ORAL | 0 refills | Status: AC

## 2024-11-25 ENCOUNTER — Ambulatory Visit: Admitting: Physician Assistant

## 2025-03-16 ENCOUNTER — Ambulatory Visit: Admitting: Internal Medicine

## 2025-04-09 ENCOUNTER — Ambulatory Visit

## 2025-10-29 ENCOUNTER — Ambulatory Visit
